# Patient Record
Sex: Female | Born: 1950 | Race: White | Hispanic: No | Marital: Married | Smoking: Former smoker
Health system: Southern US, Community
[De-identification: ages and names within clinical notes are randomized; demographics above are authoritative.]

## PROBLEM LIST (undated history)

## (undated) DIAGNOSIS — F329 Major depressive disorder, single episode, unspecified: Secondary | ICD-10-CM

## (undated) DIAGNOSIS — H269 Unspecified cataract: Secondary | ICD-10-CM

## (undated) DIAGNOSIS — R519 Headache, unspecified: Secondary | ICD-10-CM

## (undated) DIAGNOSIS — R011 Cardiac murmur, unspecified: Secondary | ICD-10-CM

## (undated) DIAGNOSIS — C801 Malignant (primary) neoplasm, unspecified: Secondary | ICD-10-CM

## (undated) DIAGNOSIS — F419 Anxiety disorder, unspecified: Secondary | ICD-10-CM

## (undated) DIAGNOSIS — C44621 Squamous cell carcinoma of skin of unspecified upper limb, including shoulder: Secondary | ICD-10-CM

## (undated) DIAGNOSIS — E785 Hyperlipidemia, unspecified: Secondary | ICD-10-CM

## (undated) DIAGNOSIS — Q2381 Bicuspid aortic valve: Secondary | ICD-10-CM

## (undated) DIAGNOSIS — J189 Pneumonia, unspecified organism: Secondary | ICD-10-CM

## (undated) DIAGNOSIS — J300 Vasomotor rhinitis: Secondary | ICD-10-CM

## (undated) DIAGNOSIS — C4491 Basal cell carcinoma of skin, unspecified: Secondary | ICD-10-CM

## (undated) HISTORY — DX: Malignant (primary) neoplasm, unspecified: C80.1

## (undated) HISTORY — DX: Squamous cell carcinoma of skin of unspecified upper limb, including shoulder: C44.621

## (undated) HISTORY — DX: Anxiety disorder, unspecified: F41.9

## (undated) HISTORY — DX: Bicuspid aortic valve: Q23.81

## (undated) HISTORY — DX: Hyperlipidemia, unspecified: E78.5

## (undated) HISTORY — DX: Vasomotor rhinitis: J30.0

## (undated) HISTORY — DX: Pneumonia, unspecified organism: J18.9

## (undated) HISTORY — DX: Headache, unspecified: R51.9

## (undated) HISTORY — DX: Unspecified cataract: H26.9

## (undated) HISTORY — DX: Basal cell carcinoma of skin, unspecified: C44.91

## (undated) HISTORY — DX: Major depressive disorder, single episode, unspecified: F32.9

## (undated) HISTORY — DX: Cardiac murmur, unspecified: R01.1

## (undated) HISTORY — PX: TONSILLECTOMY: SUR1361

---
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
## 1998-11-01 ENCOUNTER — Encounter: Payer: Self-pay | Admitting: Internal Medicine

## 1998-11-01 ENCOUNTER — Ambulatory Visit (HOSPITAL_COMMUNITY): Admission: RE | Admit: 1998-11-01 | Discharge: 1998-11-01 | Payer: Self-pay | Admitting: Nurse Practitioner

## 1998-11-21 ENCOUNTER — Other Ambulatory Visit: Admission: RE | Admit: 1998-11-21 | Discharge: 1998-11-21 | Payer: Self-pay | Admitting: Obstetrics & Gynecology

## 1999-12-04 ENCOUNTER — Other Ambulatory Visit: Admission: RE | Admit: 1999-12-04 | Discharge: 1999-12-04 | Payer: Self-pay | Admitting: Obstetrics and Gynecology

## 2000-09-22 ENCOUNTER — Encounter: Admission: RE | Admit: 2000-09-22 | Discharge: 2000-09-22 | Payer: Self-pay | Admitting: Internal Medicine

## 2000-09-22 ENCOUNTER — Encounter: Payer: Self-pay | Admitting: Internal Medicine

## 2000-12-29 ENCOUNTER — Other Ambulatory Visit: Admission: RE | Admit: 2000-12-29 | Discharge: 2000-12-29 | Payer: Self-pay | Admitting: Obstetrics and Gynecology

## 2001-11-01 ENCOUNTER — Encounter: Payer: Self-pay | Admitting: Obstetrics and Gynecology

## 2001-11-01 ENCOUNTER — Encounter: Admission: RE | Admit: 2001-11-01 | Discharge: 2001-11-01 | Payer: Self-pay | Admitting: Obstetrics and Gynecology

## 2002-05-29 ENCOUNTER — Other Ambulatory Visit: Admission: RE | Admit: 2002-05-29 | Discharge: 2002-05-29 | Payer: Self-pay | Admitting: Obstetrics and Gynecology

## 2002-11-08 ENCOUNTER — Encounter: Payer: Self-pay | Admitting: Obstetrics and Gynecology

## 2002-11-08 ENCOUNTER — Encounter: Admission: RE | Admit: 2002-11-08 | Discharge: 2002-11-08 | Payer: Self-pay | Admitting: Obstetrics and Gynecology

## 2003-07-04 ENCOUNTER — Other Ambulatory Visit: Admission: RE | Admit: 2003-07-04 | Discharge: 2003-07-04 | Payer: Self-pay | Admitting: Obstetrics and Gynecology

## 2003-07-26 ENCOUNTER — Encounter: Payer: Self-pay | Admitting: Internal Medicine

## 2003-07-26 ENCOUNTER — Ambulatory Visit (HOSPITAL_COMMUNITY): Admission: RE | Admit: 2003-07-26 | Discharge: 2003-07-26 | Payer: Self-pay | Admitting: Internal Medicine

## 2003-11-22 ENCOUNTER — Encounter: Admission: RE | Admit: 2003-11-22 | Discharge: 2003-11-22 | Payer: Self-pay | Admitting: Obstetrics and Gynecology

## 2004-12-11 ENCOUNTER — Ambulatory Visit (HOSPITAL_COMMUNITY): Admission: RE | Admit: 2004-12-11 | Discharge: 2004-12-11 | Payer: Self-pay | Admitting: Obstetrics and Gynecology

## 2005-01-01 ENCOUNTER — Other Ambulatory Visit: Admission: RE | Admit: 2005-01-01 | Discharge: 2005-01-01 | Payer: Self-pay | Admitting: Obstetrics and Gynecology

## 2005-04-16 ENCOUNTER — Ambulatory Visit: Payer: Self-pay | Admitting: Internal Medicine

## 2005-06-16 ENCOUNTER — Ambulatory Visit: Payer: Self-pay | Admitting: Licensed Clinical Social Worker

## 2005-06-26 ENCOUNTER — Ambulatory Visit: Payer: Self-pay | Admitting: Licensed Clinical Social Worker

## 2005-07-08 ENCOUNTER — Ambulatory Visit: Payer: Self-pay | Admitting: Licensed Clinical Social Worker

## 2005-07-31 ENCOUNTER — Ambulatory Visit: Payer: Self-pay | Admitting: Licensed Clinical Social Worker

## 2006-01-07 ENCOUNTER — Ambulatory Visit (HOSPITAL_COMMUNITY): Admission: RE | Admit: 2006-01-07 | Discharge: 2006-01-07 | Payer: Self-pay | Admitting: Obstetrics and Gynecology

## 2006-03-02 ENCOUNTER — Other Ambulatory Visit: Admission: RE | Admit: 2006-03-02 | Discharge: 2006-03-02 | Payer: Self-pay | Admitting: Obstetrics and Gynecology

## 2006-12-16 ENCOUNTER — Ambulatory Visit: Payer: Self-pay | Admitting: Internal Medicine

## 2006-12-16 LAB — CONVERTED CEMR LAB
ALT: 18 units/L (ref 0–40)
AST: 18 units/L (ref 0–37)
Alkaline Phosphatase: 93 units/L (ref 39–117)
Basophils Relative: 0.9 % (ref 0.0–1.0)
CO2: 30 meq/L (ref 19–32)
Calcium: 8.9 mg/dL (ref 8.4–10.5)
Chloride: 105 meq/L (ref 96–112)
Chol/HDL Ratio, serum: 3.4
Cholesterol: 262 mg/dL (ref 0–200)
Creatinine, Ser: 0.8 mg/dL (ref 0.4–1.2)
Eosinophil percent: 1.7 % (ref 0.0–5.0)
Glomerular Filtration Rate, Af Am: 96 mL/min/{1.73_m2}
Glucose, Bld: 99 mg/dL (ref 70–99)
HDL: 77.7 mg/dL (ref >39.0)
LDL DIRECT: 175.6 mg/dL
Leukocytes, UA: NEGATIVE
Lymphocytes Relative: 19.2 % (ref 12.0–46.0)
MCHC: 34.5 g/dL (ref 30.0–36.0)
MCV: 89.5 fL (ref 78.0–100.0)
Neutro Abs: 3.6 10*3/uL (ref 1.4–7.7)
Nitrite: NEGATIVE
Platelets: 271 10*3/uL (ref 150–400)
Potassium: 3.6 meq/L (ref 3.5–5.1)
Triglyceride fasting, serum: 79 mg/dL (ref 0–149)
Urine Glucose: NEGATIVE mg/dL
Urobilinogen, UA: 0.2 (ref 0.0–1.0)

## 2006-12-21 HISTORY — PX: COLONOSCOPY: SHX174

## 2006-12-23 ENCOUNTER — Ambulatory Visit: Payer: Self-pay | Admitting: Internal Medicine

## 2007-01-28 ENCOUNTER — Ambulatory Visit (HOSPITAL_COMMUNITY): Admission: RE | Admit: 2007-01-28 | Discharge: 2007-01-28 | Payer: Self-pay | Admitting: Obstetrics and Gynecology

## 2007-02-11 ENCOUNTER — Encounter: Admission: RE | Admit: 2007-02-11 | Discharge: 2007-02-11 | Payer: Self-pay | Admitting: Obstetrics and Gynecology

## 2007-03-24 ENCOUNTER — Ambulatory Visit: Payer: Self-pay | Admitting: Gastroenterology

## 2007-04-06 ENCOUNTER — Ambulatory Visit: Payer: Self-pay | Admitting: Gastroenterology

## 2007-04-20 ENCOUNTER — Ambulatory Visit: Payer: Self-pay | Admitting: Internal Medicine

## 2007-10-15 ENCOUNTER — Ambulatory Visit: Payer: Self-pay | Admitting: Internal Medicine

## 2008-02-15 ENCOUNTER — Encounter: Payer: Self-pay | Admitting: *Deleted

## 2008-02-15 DIAGNOSIS — Z9089 Acquired absence of other organs: Secondary | ICD-10-CM | POA: Insufficient documentation

## 2008-02-15 DIAGNOSIS — F419 Anxiety disorder, unspecified: Secondary | ICD-10-CM | POA: Insufficient documentation

## 2008-02-15 DIAGNOSIS — F329 Major depressive disorder, single episode, unspecified: Secondary | ICD-10-CM

## 2008-02-15 DIAGNOSIS — E785 Hyperlipidemia, unspecified: Secondary | ICD-10-CM | POA: Insufficient documentation

## 2008-02-15 DIAGNOSIS — J309 Allergic rhinitis, unspecified: Secondary | ICD-10-CM | POA: Insufficient documentation

## 2008-03-13 ENCOUNTER — Ambulatory Visit (HOSPITAL_COMMUNITY): Admission: RE | Admit: 2008-03-13 | Discharge: 2008-03-13 | Payer: Self-pay | Admitting: Obstetrics and Gynecology

## 2008-04-24 ENCOUNTER — Ambulatory Visit: Payer: Self-pay | Admitting: Internal Medicine

## 2008-04-24 ENCOUNTER — Telehealth: Payer: Self-pay | Admitting: Internal Medicine

## 2008-04-24 DIAGNOSIS — M7710 Lateral epicondylitis, unspecified elbow: Secondary | ICD-10-CM | POA: Insufficient documentation

## 2008-07-11 ENCOUNTER — Telehealth: Payer: Self-pay | Admitting: Internal Medicine

## 2008-07-11 ENCOUNTER — Encounter: Payer: Self-pay | Admitting: Internal Medicine

## 2008-07-16 ENCOUNTER — Ambulatory Visit: Payer: Self-pay | Admitting: Internal Medicine

## 2008-07-16 LAB — CONVERTED CEMR LAB
Albumin: 3.9 g/dL (ref 3.5–5.2)
Bilirubin, Direct: 0.1 mg/dL (ref 0.0–0.3)
Cholesterol: 245 mg/dL (ref 0–200)
Direct LDL: 176.3 mg/dL
Total Bilirubin: 0.7 mg/dL (ref 0.3–1.2)
Total CHOL/HDL Ratio: 4.1
Vitamin B-12: 196 pg/mL — ABNORMAL LOW (ref 211–911)

## 2008-07-18 ENCOUNTER — Ambulatory Visit: Payer: Self-pay | Admitting: Internal Medicine

## 2008-07-18 DIAGNOSIS — E538 Deficiency of other specified B group vitamins: Secondary | ICD-10-CM | POA: Insufficient documentation

## 2008-08-15 ENCOUNTER — Ambulatory Visit: Payer: Self-pay | Admitting: Internal Medicine

## 2008-08-15 LAB — CONVERTED CEMR LAB
AST: 15 units/L (ref 0–37)
Albumin: 4 g/dL (ref 3.5–5.2)
Alkaline Phosphatase: 75 units/L (ref 39–117)
Bilirubin, Direct: 0.1 mg/dL (ref 0.0–0.3)
HDL: 77.5 mg/dL (ref >39.0)
Total CHOL/HDL Ratio: 2.5

## 2008-08-23 ENCOUNTER — Telehealth (INDEPENDENT_AMBULATORY_CARE_PROVIDER_SITE_OTHER): Payer: Self-pay | Admitting: *Deleted

## 2008-09-07 ENCOUNTER — Telehealth (INDEPENDENT_AMBULATORY_CARE_PROVIDER_SITE_OTHER): Payer: Self-pay | Admitting: *Deleted

## 2008-09-12 ENCOUNTER — Ambulatory Visit: Payer: Self-pay | Admitting: Internal Medicine

## 2008-10-04 ENCOUNTER — Telehealth: Payer: Self-pay | Admitting: Internal Medicine

## 2008-10-12 ENCOUNTER — Ambulatory Visit: Payer: Self-pay | Admitting: Internal Medicine

## 2008-11-12 ENCOUNTER — Ambulatory Visit: Payer: Self-pay | Admitting: Internal Medicine

## 2008-12-17 ENCOUNTER — Ambulatory Visit: Payer: Self-pay | Admitting: Internal Medicine

## 2008-12-26 ENCOUNTER — Telehealth: Payer: Self-pay | Admitting: Internal Medicine

## 2008-12-27 ENCOUNTER — Ambulatory Visit: Payer: Self-pay | Admitting: Internal Medicine

## 2008-12-27 LAB — CONVERTED CEMR LAB: Vitamin B-12: 450 pg/mL (ref 211–911)

## 2008-12-31 ENCOUNTER — Telehealth: Payer: Self-pay | Admitting: Internal Medicine

## 2009-01-17 ENCOUNTER — Ambulatory Visit: Payer: Self-pay | Admitting: Internal Medicine

## 2009-02-18 ENCOUNTER — Ambulatory Visit: Payer: Self-pay | Admitting: Internal Medicine

## 2009-03-21 ENCOUNTER — Ambulatory Visit: Payer: Self-pay | Admitting: Internal Medicine

## 2009-04-22 ENCOUNTER — Ambulatory Visit: Payer: Self-pay | Admitting: Internal Medicine

## 2009-06-05 ENCOUNTER — Ambulatory Visit: Payer: Self-pay | Admitting: Internal Medicine

## 2009-07-08 ENCOUNTER — Ambulatory Visit: Payer: Self-pay | Admitting: Internal Medicine

## 2009-08-08 ENCOUNTER — Ambulatory Visit (HOSPITAL_COMMUNITY): Admission: RE | Admit: 2009-08-08 | Discharge: 2009-08-08 | Payer: Self-pay | Admitting: Obstetrics and Gynecology

## 2009-08-13 ENCOUNTER — Ambulatory Visit: Payer: Self-pay | Admitting: Internal Medicine

## 2009-09-16 ENCOUNTER — Ambulatory Visit: Payer: Self-pay | Admitting: Internal Medicine

## 2009-10-18 ENCOUNTER — Ambulatory Visit: Payer: Self-pay | Admitting: Internal Medicine

## 2009-11-21 ENCOUNTER — Ambulatory Visit: Payer: Self-pay | Admitting: Internal Medicine

## 2009-12-26 ENCOUNTER — Ambulatory Visit: Payer: Self-pay | Admitting: Internal Medicine

## 2010-01-09 ENCOUNTER — Ambulatory Visit: Payer: Self-pay | Admitting: Internal Medicine

## 2010-01-09 LAB — CONVERTED CEMR LAB
ALT: 22 units/L (ref 0–35)
Albumin: 4.2 g/dL (ref 3.5–5.2)
Alkaline Phosphatase: 91 units/L (ref 39–117)
Basophils Absolute: 0 10*3/uL (ref 0.0–0.1)
Bilirubin, Direct: 0.1 mg/dL (ref 0.0–0.3)
Calcium: 9.5 mg/dL (ref 8.4–10.5)
Chloride: 104 meq/L (ref 96–112)
Creatinine, Ser: 0.7 mg/dL (ref 0.4–1.2)
Direct LDL: 179.8 mg/dL
Eosinophils Relative: 1.6 % (ref 0.0–5.0)
HCT: 43.2 % (ref 36.0–46.0)
HDL: 72.9 mg/dL (ref >39.00)
Hemoglobin: 14.5 g/dL (ref 12.0–15.0)
Leukocytes, UA: NEGATIVE
MCV: 93.2 fL (ref 78.0–100.0)
Platelets: 203 10*3/uL (ref 150.0–400.0)
RBC: 4.64 M/uL (ref 3.87–5.11)
RDW: 12.7 % (ref 11.5–14.6)
Sodium: 142 meq/L (ref 135–145)
Specific Gravity, Urine: 1.02 (ref 1.000–1.030)
TSH: 4.62 microintl units/mL (ref 0.35–5.50)
Total Bilirubin: 0.8 mg/dL (ref 0.3–1.2)
Total CHOL/HDL Ratio: 3
Total Protein: 6.8 g/dL (ref 6.0–8.3)
Urobilinogen, UA: 0.2 (ref 0.0–1.0)

## 2010-01-16 ENCOUNTER — Ambulatory Visit: Payer: Self-pay | Admitting: Internal Medicine

## 2011-01-11 ENCOUNTER — Encounter: Payer: Self-pay | Admitting: Obstetrics and Gynecology

## 2011-01-20 NOTE — Assessment & Plan Note (Signed)
Summary: B12 SHOT/MEN/CD   Nurse Visit   Allergies: No Known Drug Allergies  Medication Administration  Injection # 1:    Medication: Vit B12 1000 mcg    Diagnosis: VITAMIN B12 DEFICIENCY (ICD-266.2)    Route: IM    Site: L deltoid    Exp Date: 08/22/2011    Lot #: [id number]    Mfr: [employer] Regent    Patient tolerated injection without complications    Given by: Lucious Groves (December 26, 2009 8:40 AM)  Orders Added: 1)  Vit B12 1000 mcg [J3420] 2)  Admin of Therapeutic Inj  intramuscular or subcutaneous [96045]

## 2011-01-20 NOTE — Assessment & Plan Note (Signed)
Summary: CPX-LB   Vital Signs:  Patient profile:   60 year old female Height:      65 inches Weight:      145 pounds BMI:     24.22 O2 Sat:      98 % on Room air Temp:     98.2 degrees F oral Pulse rate:   67 / minute BP sitting:   136 / 82  (left arm) Cuff size:   regular  Vitals Entered By: Bill Salinas CMA (January 16, 2010 1:30 PM)  O2 Flow:  Room air CC: cpx/ pt is due for tetanus and she is due for pap with Dr Clydie Braun Richardson/ ab  Vision Screening:      Vision Comments: Due in about 1 month with Dr Blima Ledger  Vision Entered By: Bill Salinas CMA (January 16, 2010 1:34 PM)   Primary Care Provider:  Norins  CC:  cpx/ pt is due for tetanus and she is due for pap with Dr Clydie Braun Richardson/ ab.  History of Present Illness: She presents for routine exam. She is feeling good with only minor aches and pains. She did miss her Gyn appointment and will reschedule. Had mammography which was normal (no report to me). During the year she had a basal carcinoma excised Venancio Poisson).  Current Medications (verified): 1)  Vitamin C 500 Mg  Tabs (Ascorbic Acid) .... Take 1 Tablet By Mouth Two Times A Day 2)  Calcium 600/vitamin D 600-400 Mg-Unit  Tabs (Calcium Carbonate-Vitamin D) .... Take 1 Tablet By Mouth A Day 3)  Fish Oil 1200 Mg  Caps (Omega-3 Fatty Acids) .... Take 1 Tablet By Mouth Two Times A Day 4)  Alprazolam 0.25 Mg Tabs (Alprazolam) .Marland Kitchen.. 1 By Mouth Q6 As Needed 5)  Daily Multiple Vitamins  Tabs (Multiple Vitamin) .Marland Kitchen.. 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    Physical Exam  General:  Well-developed,well-nourished,in no acute distress; alert,appropriate and cooperative throughout examination Head:  Normocephalic and atraumatic without obvious abnormalities. No apparent alopecia or balding. Eyes:  No corneal or conjunctival inflammation noted. EOMI. Perrla. Funduscopic exam benign, without hemorrhages, exudates or papilledema. Vision grossly normal. Ears:  External ear exam shows no significant lesions or deformities.  Otoscopic examination reveals clear canals,  tympanic membranes are intact bilaterally without bulging, retraction, inflammation or discharge. Hearing is grossly normal bilaterally. Nose:  External nasal examination shows no deformity or inflammation. Nasal mucosa are pink and moist without lesions or exudates. Mouth:  Oral mucosa and oropharynx without lesions or exudates.  Teeth in good repair. Neck:   No deformities, masses, or tenderness noted. Chest Wall:  No deformities, masses, or tenderness noted. Lungs:  normal respiratory effort and normal breath sounds.   Heart:  normal rate and regular rhythm.   Abdomen:  soft, non-tender, normal bowel sounds, and no hepatomegaly.   Genitalia:  deferred Msk:  No deformity or scoliosis noted of thoracic or lumbar spine.   Pulses:  2+ radials Neurologic:  alert & oriented X3 and cranial nerves II-XII intact.   Skin:  turgor normal, no suspicious lesions, no ulcerations, and no edema.   Psych:  Oriented X3 and memory intact for recent and remote.     Impression & Recommendations:  Problem # 1:  HYPERLIPIDEMIA (ICD-272.4)  Chart reviewed - in '09 trial of lovastatin with resuliting LDL to 72!She developed myalgias and stopped meds. Did Framingham cardiac risk calculation: 10 year risk went from 2% with current HDL to 1 % with total cholesteol at goal.   Plan - life-style mangement only.  The following medications were removed from the medication list:    Lovastatin 20 Mg Tabs (Lovastatin) .Marland Kitchen... 1 by mouth q pm  Problem # 2:  Preventive Health Care (ICD-V70.0) Normal history and normal exam. Labs were all normal except of for elevated cholsterol. She is current with gyn. Current with colorectal cancer screeing with normal colonoscopg in '08. She is cude for mammography and will schedule her own appointment.  She will work on weight reduction through smart choices in her diet and portion size control with a goal of loosing 1 lb/month.   In summary - a very nice woman who is medically stable. We did discuss in detail about cardiac risk and lipis including a caridac risk  calculation as noted above.  she wil continue a healthy life-style and return for routine follow-up in 18 -24 months.   Complete Medication List: 1)  Vitamin C 500 Mg Tabs (Ascorbic acid) .... Take 1 tablet by mouth two times a day 2)  Calcium 600/vitamin D 600-400 Mg-unit Tabs  (Calcium carbonate-vitamin d) .... Take 1 tablet by mouth a day 3)  Fish Oil 1200 Mg Caps (Omega-3 fatty acids) .... Take 1 tablet by mouth two times a day 4)  Alprazolam 0.25 Mg Tabs (Alprazolam) .Marland Kitchen.. 1 by mouth q6 as needed 5)  Daily Multiple Vitamins Tabs (Multiple vitamin) .Marland Kitchen.. 1 tab daily  Patient: [facility] Note: All result statuses are Final unless otherwise noted.  Tests: (1) BMP (METABOL)   Sodium                    142 mEq/L                   135-145   Potassium                 4.9 mEq/L                   3.5-5.1   Chloride                  104 mEq/L                   96-112  Carbon Dioxide            31 mEq/L                    19-32   Glucose                   93 mg/dL                    04-54   BUN                       12 mg/dL                    0-98   Creatinine                0.7 mg/dL                   1.1-9.1   Calcium                   9.5 mg/dL                   4.7-82.9   GFR                       91.67 mL/min                >60  Tests: (2) Lipid Panel (LIPID)   Cholesterol          [H]  249 mg/dL                   5-621     ATP III Classification            Desirable:  < 200 mg/dL                    Borderline High:  200 - 239 mg/dL               High:  > = 240 mg/dL   Triglycerides             63.0 mg/dL                  3.0-865.7     Normal:  <150 mg/dL     Borderline High:  846 - 199 mg/dL   HDL                       96.29 mg/dL                 >52.84   VLDL Cholesterol          12.6 mg/dL                  1.3-24.4  CHO/HDL Ratio:  CHD Risk                             3                    Men          Women     1/2 Average Risk     3.4          3.3     Average Risk          5.0  4.4     2X Average Risk          9.6          7.1     3X Average Risk          15.0          11.0                           Tests: (3) CBC Platelet w/Diff (CBCD)   White Cell Count     [L]  4.3 K/uL                    4.5-10.5   Red Cell Count            4.64  Mil/uL                 3.87-5.11   Hemoglobin                14.5 g/dL                   19.1-47.8   Hematocrit                43.2 %                      36.0-46.0   MCV                       93.2 fl                     78.0-100.0   MCHC                      33.5 g/dL                   29.5-62.1   RDW                       12.7 %                      11.5-14.6   Platelet Count            203.0 K/uL                  150.0-400.0   Neutrophil %              67.0 %                      43.0-77.0   Lymphocyte %              23.3 %                      12.0-46.0   Monocyte %                7.9 %                       3.0-12.0   Eosinophils%              1.6 %                       0.0-5.0   Basophils %               0.2 %  0.0-3.0   Neutrophill Absolute      2.9 K/uL                    1.4-7.7   Lymphocyte Absolute       1.0 K/uL                    0.7-4.0   Monocyte Absolute         0.3 K/uL                    0.1-1.0  Eosinophils, Absolute                             0.1 K/uL                    0.0-0.7   Basophils Absolute        0.0 K/uL                    0.0-0.1  Tests: (4) Hepatic/Liver Function Panel (HEPATIC)   Total Bilirubin           0.8 mg/dL                   1.6-1.0   Direct Bilirubin          0.1 mg/dL                   9.6-0.4   Alkaline Phosphatase      91 U/L                      39-117   AST                       20 U/L                      0-37   ALT                       22 U/L                      0-35   Total Protein             6.8 g/dL                    5.4-0.9   Albumin                   4.2 g/dL                    8.1-1.9  Tests: (5) TSH (TSH)   FastTSH                   4.62 uIU/mL                 0.35-5.50  Tests: (6) UDip Only (UDIP)   Color                     YELLOW       RANGE:  Yellow;Lt. Yellow   Clarity                   CLEAR  Clear   Specific Gravity          1.020                       1.000 - 1.030    Urine Ph                  5.5                         5.0-8.0   Protein                   NEGATIVE                    Negative   Urine Glucose             NEGATIVE                    Negative   Ketones                   NEGATIVE                    Negative   Urine Bilirubin           NEGATIVE                    Negative   Blood                     TRACE-INTACT                Negative   Urobilinogen              0.2                         0.0 - 1.0   Leukocyte Esterace        NEGATIVE                    Negative   Nitrite                   NEGATIVE                    Negative  Tests: (7) Cholesterol LDL - Direct (DIRLDL)  Cholesterol LDL - Direct                             179.8 mg/dL     Optimal:  <161 mg/dL     Near or Above Optimal:  100-129 mg/dL     Borderline High:  096-045 mg/dL     High:  409-811 mg/dL     Very High:  >914 mg/dL

## 2011-05-08 NOTE — Assessment & Plan Note (Signed)
[facility]                           PRIMARY CARE OFFICE NOTE   Dana Bryan, Dana Bryan                     MRN:          [id number]  DATE:12/23/2006                            DOB:          1951/10/28    Dana Bryan is a very pleasant 60 year old woman who presents today for  follow-up evaluation and exam.  She was last seen in the office April 16, 2005.  Problems at that time included leg pain, which was treated  with diclofenac and eventually resolved.  The patient was also being  seen for mild situational and motivational depression.  She did have  several counseling sessions.  She was on Wellbutrin XL 300 mg q.a.m.  She has done better and since that time has been able to stop  medication.   The patient has no active complaints at today's visit and reports she is  feeling well and doing well.   PAST MEDICAL HISTORY:  Surgical:  Tonsillectomy, remote.  She is a gravida 2, para 2.   Medical:  1. Usual childhood disease.  2. Vasomotor rhinitis.  3. Multiple moles have been excised.   No other active medical problems.   CURRENT MEDICATIONS:  Primrose oil b.i.d.   REVIEW OF SYSTEMS:  The patient has had no fevers, sweats, chills or  other constitutional symptoms.  No ophthalmologic, ENT, cardiovascular,  respiratory, GI or GU complaints.  The patient continues to see Dr.  Huel Cote for GYN, last examination being in the spring of 2007.  She reports she had a mammogram in spring of 2007 as well.   PHYSICAL EXAMINATION:  VITAL SIGNS:  Temperature was 97.8, blood  pressure 137/76, pulse 77, weight 148.  GENERAL APPEARANCE:  A well-nourished, well-developed woman in no acute  distress.  HEENT:  Normocephalic, atraumatic.  EACs and TMs were unremarkable.  Oropharynx with native dentition in good repair.  No buccal or palatal  lesions were noted.  Posterior pharynx was clear.  Conjunctivae and  sclerae were clear.  PERRLA, EOMI.   Funduscopic exam was unremarkable.  NECK:  Supple without thyromegaly.  NODES:  No adenopathy was noted in the cervical or supraclavicular  regions.  CHEST:  No CVA tenderness.  LUNGS:  Clear to auscultation and percussion.  BREASTS:  Exam deferred to gynecology.  CARDIOVASCULAR:  2+ radial pulse, no JVD, no carotid bruit.  She had a  quiet precordium with a regular rate and rhythm without murmurs, rubs or  gallops.  ABDOMEN:  Soft, no guarding, no rebound.  No organosplenomegaly was  appreciated.  PELVIC, RECTAL:  Exams referred to gynecology.  EXTREMITIES:  Without clubbing, cyanosis, edema or deformity.  NEUROLOGIC:  Grossly nonfocal.  SKIN:  Skin appeared clear although a full body exam was not performed.   DATA BASE:  Hemoglobin 14.1 g, white count was 4900.  Chemistries were  normal with a glucose of 99, serum creatinine was 0.8, GFR was 80.  Liver functions were normal.  Thyroid function normal with TSH 2.31.  Urinalysis was negative.  Cholesterol was 262, triglycerides 79, HDL  77.7, LDL was 175.6.  ASSESSMENT AND PLAN:  1. Hyperlipidemia.  The patient does have an elevated LDL cholesterol.      In 2005 her LDL was 157.3.  Using the Framingham cardiac risk      calculation, the patient has a 10-year risk of cardiovascular event      of 4%, which is better than average.  Her heart data risk is 2%.      Discussed this with the patient.  At this point I would recommend      lifestyle management with diet and exercise to get her cholesterol      level down to a goal of less than 130 with a treatment goal of      greater than 160 on the LDL.  The patient will return in 6 months      for a repeat lipid panel  2. Health maintenance.  The patient is currently up to date with her      gynecologist.  Her examination today is otherwise unrevealing.  She      is a former smoker now being almost 3 years abstinent.  She will be      sent for PA and lateral chest x-ray.  3. The patient  has been encouraged to obtain colorectal cancer      screening and a referral will be made to the GI department for the      same.   In summary, this is a very pleasant woman who seems medically stable at  this time.  She will return for follow-up on her lipids and will be  scheduled for colonoscopy.     Rosalyn Gess Norins, MD  Electronically Signed    MEN/MedQ  DD: 12/24/2006  DT: 12/24/2006  Job #: [id number]   cc:   Milagros Reap

## 2011-07-30 ENCOUNTER — Other Ambulatory Visit (INDEPENDENT_AMBULATORY_CARE_PROVIDER_SITE_OTHER): Payer: BC Managed Care – PPO

## 2011-07-30 ENCOUNTER — Other Ambulatory Visit: Payer: Self-pay | Admitting: Internal Medicine

## 2011-07-30 DIAGNOSIS — Z0000 Encounter for general adult medical examination without abnormal findings: Secondary | ICD-10-CM

## 2011-07-30 LAB — URINALYSIS
Bilirubin Urine: NEGATIVE
Hgb urine dipstick: NEGATIVE
Leukocytes, UA: NEGATIVE
Nitrite: NEGATIVE
Urobilinogen, UA: 0.2 (ref 0.0–1.0)

## 2011-07-30 LAB — CBC WITH DIFFERENTIAL/PLATELET
Basophils Relative: 0.3 % (ref 0.0–3.0)
Eosinophils Absolute: 0.1 10*3/uL (ref 0.0–0.7)
Eosinophils Relative: 1.2 % (ref 0.0–5.0)
Lymphs Abs: 1.1 10*3/uL (ref 0.7–4.0)
Monocytes Absolute: 0.3 10*3/uL (ref 0.1–1.0)
Monocytes Relative: 6.9 % (ref 3.0–12.0)
Neutro Abs: 3.4 10*3/uL (ref 1.4–7.7)
Platelets: 219 10*3/uL (ref 150.0–400.0)
RBC: 4.76 Mil/uL (ref 3.87–5.11)
WBC: 4.9 10*3/uL (ref 4.5–10.5)

## 2011-07-30 LAB — HEPATIC FUNCTION PANEL
Albumin: 4.4 g/dL (ref 3.5–5.2)
Bilirubin, Direct: 0.1 mg/dL (ref 0.0–0.3)
Total Bilirubin: 0.9 mg/dL (ref 0.3–1.2)
Total Protein: 7.4 g/dL (ref 6.0–8.3)

## 2011-07-30 LAB — BASIC METABOLIC PANEL
BUN: 14 mg/dL (ref 6–23)
CO2: 30 mEq/L (ref 19–32)
Calcium: 9.3 mg/dL (ref 8.4–10.5)
Chloride: 105 mEq/L (ref 96–112)
GFR: 81.68 mL/min (ref >60.00)
Glucose, Bld: 98 mg/dL (ref 70–99)
Potassium: 4.4 mEq/L (ref 3.5–5.1)

## 2011-07-30 LAB — LDL CHOLESTEROL, DIRECT: Direct LDL: 168.5 mg/dL

## 2011-07-30 LAB — LIPID PANEL: Cholesterol: 254 mg/dL — ABNORMAL HIGH (ref 0–200)

## 2011-08-05 ENCOUNTER — Encounter: Payer: Self-pay | Admitting: Internal Medicine

## 2011-08-06 ENCOUNTER — Encounter: Payer: Self-pay | Admitting: Internal Medicine

## 2011-08-06 ENCOUNTER — Ambulatory Visit (INDEPENDENT_AMBULATORY_CARE_PROVIDER_SITE_OTHER): Payer: BC Managed Care – PPO | Admitting: Internal Medicine

## 2011-08-06 VITALS — BP 138/80 | HR 61 | Temp 97.9°F | Ht 64.5 in | Wt 143.0 lb

## 2011-08-06 DIAGNOSIS — Z136 Encounter for screening for cardiovascular disorders: Secondary | ICD-10-CM

## 2011-08-06 DIAGNOSIS — Z0000 Encounter for general adult medical examination without abnormal findings: Secondary | ICD-10-CM

## 2011-08-06 DIAGNOSIS — F4321 Adjustment disorder with depressed mood: Secondary | ICD-10-CM

## 2011-08-06 DIAGNOSIS — E785 Hyperlipidemia, unspecified: Secondary | ICD-10-CM

## 2011-08-06 NOTE — Assessment & Plan Note (Signed)
She admits to mild "downs" which she attributes to working at home and being professionally isolated. She does not feel this is serious nor does she feel a need for any counseling or medical therapy.

## 2011-08-06 NOTE — Progress Notes (Signed)
Subjective:    Patient ID: Dana Bryan, female    DOB: 03-09-1951, 60 y.o.   MRN: [id number]  HPI Dana Bryan presents for general physical exam. She has not had any major illness, injury or surgery. She does report that she has some trouble with mood: she does work out of her home and is isolated. She recognizes this as an issue and is seeking answers. She is current with gyn care.  Past Medical History  Diagnosis Date  . Hyperlipidemia   . Reactive depression (situational)   . Vasomotor rhinitis    Past Surgical History  Procedure Date  . Tonsillectomy    Family History  Problem Relation Age of Onset  . Hypertension Mother   . Alcohol abuse Father    History   Social History  . Marital Status: Married    Spouse Name: N/A    Number of Children: N/A  . Years of Education: N/A   Occupational History  . Not on file.   Social History Main Topics  . Smoking status: Former Smoker    Types: Cigarettes    Quit date: 01/22/2004  . Smokeless tobacco: Not on file  . Alcohol Use: Not on file  . Drug Use: Not on file  . Sexually Active: Not on file   Other Topics Concern  . Not on file   Social History Narrative   HSG, college Grad. Married '71. 2 Daughters- '77, '81. 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  HEENT:  Negative for hearing loss, ear pain, congestion, neck stiffness and postnasal drip. Negative for sore throat or swallowing problems. Negative for dental complaints.   Eyes: Negative for vision loss or change in visual acuity.  Respiratory: Negative for chest tightness and wheezing.   Cardiovascular: Negative for chest pain and palpitation. No decreased exercise tolerance Gastrointestinal: No change in bowel habit. No bloating or gas. No reflux or  indigestion Genitourinary: Negative for urgency, frequency, flank pain and difficulty urinating.  Musculoskeletal: Negative for myalgias, back pain, arthralgias and gait problem.  Neurological: Negative for dizziness, tremors, weakness and headaches.  Hematological: Negative for adenopathy.  Psychiatric/Behavioral: Negative for behavioral problems and dysphoric mood.       Objective:   Physical Exam Vitals reviewed. Gen'l: well nourished, well developed white woman in no distress HEENT - [place]/AT, EACs/TMs normal, oropharynx with native dentition in good condition, no buccal or palatal lesions, posterior pharynx clear, mucous membranes moist. C&S clear, PERRLA, fundi - normal Neck - supple, no thyromegaly Nodes- negative submental, cervical, supraclavicular regions Chest - no deformity, no CVAT Lungs - clear without rales, wheezes. No increased work of breathing Breast - deferred Cardiovascular - regular rate and rhythm, quiet precordium, no murmurs, rubs or gallops, 2+ radial, DP and PT pulses Abdomen - BS+ x 4, no HSM, no guarding or rebound or tenderness Pelvic - deferred to gyn Rectal - deferred to gyn/GI Extremities - no clubbing, cyanosis, edema or deformity.  Neuro - A&O x 3, CN II-XII normal, motor strength normal and equal, DTRs 2+ and symmetrical biceps, radial, and patellar tendons. Cerebellar - no tremor, no rigidity, fluid movement and normal gait. Derm - Head, neck, back, abdomen and extremities without suspicious lesions  Lab Results  Component Value Date  WBC 4.9 07/30/2011   HGB 14.9 07/30/2011   HCT 44.2 07/30/2011   PLT 219.0 07/30/2011   CHOL 254* 07/30/2011   TRIG 92.0 07/30/2011   HDL 75.60 07/30/2011   LDLDIRECT 168.5 07/30/2011   ALT 17 07/30/2011   AST 14 07/30/2011   NA 142 07/30/2011   K 4.4 07/30/2011   CL 105 07/30/2011   CREATININE 0.8 07/30/2011   BUN 14 07/30/2011   CO2 30 07/30/2011   TSH 5.53* 07/30/2011           Assessment & Plan:

## 2011-08-06 NOTE — Assessment & Plan Note (Signed)
LDL remains over 160 after a year+ of life-style management. The HDL is very high and protective at 76. NCEP Cardiac Risk calculation using current total choelsterol and HDL reveals a 2% chance of a cardiac event over the next 10 years = low risk category.  Discussed cholesterol management and risk at length. Her decision is to continue with life-style management and not to engage in medical management.

## 2011-08-12 ENCOUNTER — Other Ambulatory Visit (HOSPITAL_COMMUNITY): Payer: Self-pay | Admitting: Obstetrics and Gynecology

## 2011-08-12 DIAGNOSIS — Z1231 Encounter for screening mammogram for malignant neoplasm of breast: Secondary | ICD-10-CM

## 2011-08-27 ENCOUNTER — Ambulatory Visit (HOSPITAL_COMMUNITY)
Admission: RE | Admit: 2011-08-27 | Discharge: 2011-08-27 | Disposition: A | Discharge status: Home / Self Care | Payer: BC Managed Care – PPO | Source: Ambulatory Visit | Attending: Obstetrics and Gynecology | Admitting: Obstetrics and Gynecology

## 2011-08-27 DIAGNOSIS — Z1231 Encounter for screening mammogram for malignant neoplasm of breast: Secondary | ICD-10-CM

## 2012-02-03 ENCOUNTER — Telehealth: Payer: Self-pay | Admitting: *Deleted

## 2012-02-03 NOTE — Telephone Encounter (Signed)
Pt is requesting referral because of foot pain and aches

## 2012-02-03 NOTE — Telephone Encounter (Signed)
Needs OV.  

## 2012-02-04 NOTE — Telephone Encounter (Signed)
Forwarded to scheduler/NS to set-up OV w/patient.

## 2012-02-23 ENCOUNTER — Encounter: Payer: Self-pay | Admitting: Internal Medicine

## 2012-02-23 ENCOUNTER — Ambulatory Visit (INDEPENDENT_AMBULATORY_CARE_PROVIDER_SITE_OTHER): Payer: BC Managed Care – PPO | Admitting: Internal Medicine

## 2012-02-23 ENCOUNTER — Ambulatory Visit (INDEPENDENT_AMBULATORY_CARE_PROVIDER_SITE_OTHER)
Admission: RE | Admit: 2012-02-23 | Discharge: 2012-02-23 | Disposition: A | Discharge status: Home / Self Care | Payer: BC Managed Care – PPO | Source: Ambulatory Visit | Attending: Internal Medicine | Admitting: Internal Medicine

## 2012-02-23 VITALS — BP 118/68 | HR 62 | Resp 14 | Wt 137.0 lb

## 2012-02-23 DIAGNOSIS — G8929 Other chronic pain: Secondary | ICD-10-CM

## 2012-02-23 DIAGNOSIS — M79671 Pain in right foot: Secondary | ICD-10-CM

## 2012-02-23 DIAGNOSIS — M79672 Pain in left foot: Secondary | ICD-10-CM

## 2012-02-23 DIAGNOSIS — M79609 Pain in unspecified limb: Secondary | ICD-10-CM

## 2012-02-23 NOTE — Progress Notes (Signed)
  Subjective:    Patient ID: Dana Bryan, female    DOB: [birth year], 61 y.o.   MRN: [id number]  HPI Dana Bryan has a long history of foot pain, since age 78. She has had some increase in foot pain and she is limited in her activities: walking, standing, dancing. She does get relief with NSAIDs. She has not had consultation; she does not currently use orthotics.  She feels great. Her BP has been well controlled.  PMH, FamHx and SocHx reviewed for any changes and relevance.    Review of Systems System review is negative for any constitutional, cardiac, pulmonary, GI or neuro symptoms or complaints other than as described in the HPI.     Objective:   Physical Exam Filed Vitals:   02/23/12 0928  BP: 118/68  Pulse: 62  Resp: 14   gen'l- WNWD white woman in no distress Cor- RRR Pulm - normal respirations Ext - 2+ DP pulse, mild elevation at MTP joints but no hammer toe deformity. No tenderness of plantar aspect MTP joints. Sensation is normal. No callus or bunion.       Assessment & Plan:

## 2012-02-23 NOTE — Assessment & Plan Note (Signed)
Chronic foot pain. Normal exam. Question of structural problem vs DJD  Plan - bilateral foot films           Refer to Dr. Darrick Penna.

## 2012-02-24 ENCOUNTER — Encounter: Payer: Self-pay | Admitting: Internal Medicine

## 2012-03-02 ENCOUNTER — Ambulatory Visit (INDEPENDENT_AMBULATORY_CARE_PROVIDER_SITE_OTHER): Payer: BC Managed Care – PPO | Admitting: Sports Medicine

## 2012-03-02 VITALS — BP 144/84 | Ht 64.0 in | Wt 137.0 lb

## 2012-03-02 DIAGNOSIS — R269 Unspecified abnormalities of gait and mobility: Secondary | ICD-10-CM

## 2012-03-02 DIAGNOSIS — Q667 Congenital pes cavus, unspecified foot: Secondary | ICD-10-CM | POA: Insufficient documentation

## 2012-03-02 NOTE — Patient Instructions (Signed)
Wear shoe inserts everyday in walking shoes. You may continue taking Ibuprofen as needed at night if foot pain is severe. Return in 1 month for follow up and bring more shoes with you. If bilateral foot pain persists, we may consider orthotics at that time. See you in one month.

## 2012-03-02 NOTE — Assessment & Plan Note (Addendum)
Supinated gait will hopefully improve with shoe inserts.  For 1 month We will use sports insoles with a moderate arch support and a lateral heel wedge  Walking gait after placement of these insoles showed much less supination at the rear foot  If this strategy works will recheck her in one month and consider putting her in a custom orthotic

## 2012-03-02 NOTE — Progress Notes (Signed)
  Subjective:    Patient ID: Dana Bryan, female    DOB: 1951/06/13, 61 y.o.   MRN: [id number]  HPI Patient referred courtesy of Dr Debby Bud  Patient presents with bilateral foot pain.  Has been going on for since she was 61 years old.  Pain is worse with exertion, better with rest.  Describes pain as dull, aching.  Located at dorsum of bilateral feet.  At times, pain will radiate from foot to knees and thighs.  No numbness or tingling sensation.  Patient was told by a friend that she has high arches.  Her grandmother had high arches as well.  Patient has not tried any heel inserts or insoles yet.  She takes ibuprofen occasionally at bedtime when pain is severe.  Review of Systems  Per HPI    Objective:   Physical Exam NAD  Normal inspection with no visible swelling/erythema.   Metatarsal Bossing present. No tenderness at medial insertion of plantar fascia into calcaneus. Great toe motion: normal Arch shape: high, right arch 3.1 cm, left arch 2.9 cm Some flattening of the lateral foot over the metatarsals 3 and 4 bilaterally Supinated gait abnormality with lateralization of foot strike      Assessment & Plan:

## 2012-03-02 NOTE — Assessment & Plan Note (Signed)
Shoe inserts given with padding on lateral part of insert. Return to clinic in 1 month.  Patient to bring more shoes. May consider custom orthotics if not better.

## 2012-04-04 ENCOUNTER — Encounter: Payer: Self-pay | Admitting: Sports Medicine

## 2012-04-04 ENCOUNTER — Ambulatory Visit (INDEPENDENT_AMBULATORY_CARE_PROVIDER_SITE_OTHER): Payer: BC Managed Care – PPO | Admitting: Sports Medicine

## 2012-04-04 VITALS — BP 134/91 | HR 62

## 2012-04-04 DIAGNOSIS — G8929 Other chronic pain: Secondary | ICD-10-CM

## 2012-04-04 DIAGNOSIS — M79609 Pain in unspecified limb: Secondary | ICD-10-CM

## 2012-04-04 NOTE — Progress Notes (Signed)
  Subjective:    Patient ID: Dana Bryan, female    DOB: 1951/09/01, 61 y.o.   MRN: [id number]  HPI 61 y/o female is here to follow up for her chronic bilateral foot pain, since age 9.  Her pain was no improved with sports insoles and lateral heel wedges.  She got some new heel pain with wearing these.  She doesn't have the heel pain today.  She noticed it most after prolonged walking.  She is here with a collection of her shoes.  Regarding her shoes she has a problem with fit.  Size 8 is too small and size 8.5 flop off nad her feet slide to the front of the shoe cramming her toes in.   Review of Systems     Objective:   Physical Exam No tenderness to palpation of either foot Bilateral pes cavus Midfoot and forefoot show some separation and arch collapse TMT bossing mild Supinated gait         Assessment & Plan:

## 2012-04-04 NOTE — Assessment & Plan Note (Signed)
The underlying issue is her pes cavus foot.  We have given her several types of scaphoid pads to try in her shoes.  We have taken the heel wedges off of the sports insoles but she will continue to use the sports insoles.  Once we determine which scaphoid pad works best for her, we will either build it into a custom orthotic or assist her in ordering several additional pair.

## 2012-07-06 ENCOUNTER — Encounter: Payer: Self-pay | Admitting: Internal Medicine

## 2012-07-06 ENCOUNTER — Telehealth: Payer: Self-pay | Admitting: Internal Medicine

## 2012-07-06 ENCOUNTER — Ambulatory Visit (INDEPENDENT_AMBULATORY_CARE_PROVIDER_SITE_OTHER): Payer: BC Managed Care – PPO | Admitting: Internal Medicine

## 2012-07-06 VITALS — BP 120/78 | HR 62 | Temp 97.9°F | Resp 16 | Wt 128.0 lb

## 2012-07-06 DIAGNOSIS — F329 Major depressive disorder, single episode, unspecified: Secondary | ICD-10-CM

## 2012-07-06 DIAGNOSIS — F32A Depression, unspecified: Secondary | ICD-10-CM

## 2012-07-06 DIAGNOSIS — F3289 Other specified depressive episodes: Secondary | ICD-10-CM

## 2012-07-06 MED ORDER — BUPROPION HCL ER (XL) 150 MG PO TB24: 150.0000 mg | ORAL_TABLET | Freq: Every day | ORAL | Status: DC

## 2012-07-06 NOTE — Telephone Encounter (Signed)
Caller: Malijah/Patient; PCP: Illene Regulus; CB#: [phone]. Call regarding Depression; Caller asking for an appt to see PCP re some depression that has worsened over past 2 weeks. Appt scheduled as requested for today, Wed 7/17 at 11am with Dr Debby Bud.

## 2012-07-09 ENCOUNTER — Encounter: Payer: Self-pay | Admitting: Internal Medicine

## 2012-07-09 NOTE — Assessment & Plan Note (Signed)
Reports persistent depression with low motivation. She denies suicidality. She has tried wellbutrin XL in the past for smoking cessation and tolerated it.  Plan  Medical therapy - Wellbutrin XL 150 mg qAM, return OV 3-4 weeks - will consider increasing dose to 300 mg  Asked her to consider counseling - provided tele # for [facility]

## 2012-07-09 NOTE — Progress Notes (Signed)
  Subjective:    Patient ID: Dana Bryan, female    DOB: 1951-04-27, 61 y.o.   MRN: [id number]  HPI Dana Bryan presents with the complaint of depression. She reports that for several months she has felt unhappy. She admits to irritability, anhedonia, worry, feeling of hopelessness, low motivational state. She reports no sexual activity in her marriage for several years - along with decreased libido. For the past several months she has tried coping on her own. She initially thought it was working at home with isolation that was a cause but now that she is back in the work place there is no improvement. She denies any suicidal ideation.  Past Medical History  Diagnosis Date  . Hyperlipidemia   . Reactive depression (situational)   . Vasomotor rhinitis    Past Surgical History  Procedure Date  . Tonsillectomy    Family History  Problem Relation Age of Onset  . Hypertension Mother   . Alcohol abuse Father    History   Social History  . Marital Status: Married    Spouse Name: N/A    Number of Children: N/A  . Years of Education: N/A   Occupational History  . Not on file.   Social History Main Topics  . Smoking status: Former Smoker    Types: Cigarettes    Quit date: 01/22/2004  . Smokeless tobacco: Not on file  . Alcohol Use: Not on file  . Drug Use: Not on file  . Sexually Active: Not on file   Other Topics Concern  . Not on file   Social History Narrative   HSG, college Grad. Married '71. 2 Daughters- '77, '81. 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  '11 has returned to work- out of the home ( [employer])    Current Outpatient Prescriptions on File Prior to Visit  Medication Sig Dispense Refill  . ALPRAZolam (XANAX) 0.25 MG tablet Take 0.25 mg by mouth every 6 (six) hours as needed.        . Calcium Carbonate-Vitamin D (CALCIUM 600+D) 600-200 MG-UNIT TABS Take 1 tablet by mouth daily.        . MULTIPLE VITAMIN PO Take 1 tablet by mouth daily.         . vitamin C (ASCORBIC ACID) 500 MG tablet Take 500 mg by mouth daily.        . Biotin 5000 MCG TABS Take 1 tablet by mouth daily.        Marland Kitchen buPROPion (WELLBUTRIN XL) 150 MG 24 hr tablet Take 1 tablet (150 mg total) by mouth daily.  30 tablet  1  . fish oil-omega-3 fatty acids 1000 MG capsule Take 2 g by mouth daily.        . Magnesium 250 MG TABS Take 1 tablet by mouth daily.            Review of Systems System review is negative for any constitutional, cardiac, pulmonary, GI or neuro symptoms or complaints other than as described in the HPI.     Objective:   Physical Exam Filed Vitals:   07/06/12 1109  BP: 120/78  Pulse: 62  Temp: 97.9 F (36.6 C)  Resp: 16   Gen'l - well groomed woman in no distress, somewhat flat affect 2+ pulse Pulm - normal respirations. Neuro - A&O x 3, CN II-XII grossly intact.       Assessment & Plan:

## 2012-07-27 ENCOUNTER — Ambulatory Visit (INDEPENDENT_AMBULATORY_CARE_PROVIDER_SITE_OTHER): Payer: BC Managed Care – PPO | Admitting: Internal Medicine

## 2012-07-27 ENCOUNTER — Encounter: Payer: Self-pay | Admitting: Internal Medicine

## 2012-07-27 VITALS — BP 142/88 | HR 65 | Temp 97.2°F | Resp 16 | Wt 126.0 lb

## 2012-07-27 DIAGNOSIS — F3289 Other specified depressive episodes: Secondary | ICD-10-CM

## 2012-07-27 DIAGNOSIS — F329 Major depressive disorder, single episode, unspecified: Secondary | ICD-10-CM

## 2012-07-27 DIAGNOSIS — F32A Depression, unspecified: Secondary | ICD-10-CM

## 2012-07-27 MED ORDER — BUPROPION HCL ER (XL) 300 MG PO TB24: 300.0000 mg | ORAL_TABLET | Freq: Every day | ORAL | Status: DC

## 2012-07-27 NOTE — Patient Instructions (Addendum)
Anxiety with depression - you have identified several issues which are very amenable to "talking " therapy. I strongly recommend that you see Judithe Modest again. You may call Victorino Dike at [phone] to schedule an appointment. Re: medication - will increase wellbutrin XL to 300 mg once a day; use alprazolam 0.25 every 6 hours as needed for anxiety/panic attacks. Please return in 1 month for follow-up

## 2012-07-28 NOTE — Progress Notes (Signed)
  Subjective:    Patient ID: Dana Bryan, female    DOB: [birth year], 61 y.o.   MRN: [id number]  HPI Ms. Auvil returns for follow -up of psychotropic medication use. She was recently started on Wellbutrin XL 150 for depression. She reports that she is tolerating the medication well and has seen some improvement in mood. No adverse side affects are reported. She does report increased anxiety and panic attacks. In talking with her she does demonstrate insight to her problems and feelings: high achiever at a point of career change and desire for life-style change.Of note, previous counseling with Judithe Modest, MSW was helpful.  PMH, FamHx and SocHx reviewed for any changes and relevance.    Review of Systems System review is negative for any constitutional, cardiac, pulmonary, GI or neuro symptoms or complaints other than as described in the HPI.     Objective:   Physical Exam Filed Vitals:   07/27/12 1035  BP: 142/88  Pulse: 65  Temp: 97.2 F (36.2 C)  Resp: 16   Gen'l- WNWD composed woman in no distress Cor- RRR w/o tachycardia or palpitations Pulm - normal respirations Psych - normal affect       Assessment & Plan:

## 2012-07-28 NOTE — Assessment & Plan Note (Signed)
Good response to Wellbutrin XL, tolerated well. Increased anxiety/panic attacks.  Plan Increase Wellbutrin XL to 300 mg qAM  Use alprazolam 0.25 mg q 6 prn anxiety attacks  Refer: return to see Ms.  Bryan for counseling - anticipate this will be very productive given her previous counseling that went well and her degree of insight to her problems  ROV 1 month

## 2012-08-08 ENCOUNTER — Ambulatory Visit (INDEPENDENT_AMBULATORY_CARE_PROVIDER_SITE_OTHER): Payer: BC Managed Care – PPO | Admitting: Licensed Clinical Social Worker

## 2012-08-08 DIAGNOSIS — F331 Major depressive disorder, recurrent, moderate: Secondary | ICD-10-CM

## 2012-08-15 ENCOUNTER — Ambulatory Visit (INDEPENDENT_AMBULATORY_CARE_PROVIDER_SITE_OTHER): Payer: BC Managed Care – PPO | Admitting: Licensed Clinical Social Worker

## 2012-08-15 DIAGNOSIS — F331 Major depressive disorder, recurrent, moderate: Secondary | ICD-10-CM

## 2012-08-24 ENCOUNTER — Encounter: Payer: Self-pay | Admitting: Internal Medicine

## 2012-08-24 ENCOUNTER — Ambulatory Visit (INDEPENDENT_AMBULATORY_CARE_PROVIDER_SITE_OTHER): Payer: BC Managed Care – PPO | Admitting: Internal Medicine

## 2012-08-24 VITALS — BP 122/80 | HR 78 | Temp 97.2°F | Resp 16 | Wt 125.0 lb

## 2012-08-24 DIAGNOSIS — F3289 Other specified depressive episodes: Secondary | ICD-10-CM

## 2012-08-24 DIAGNOSIS — F329 Major depressive disorder, single episode, unspecified: Secondary | ICD-10-CM

## 2012-08-24 DIAGNOSIS — F32A Depression, unspecified: Secondary | ICD-10-CM

## 2012-08-24 MED ORDER — BUPROPION HCL ER (XL) 300 MG PO TB24: 300.0000 mg | ORAL_TABLET | Freq: Every day | ORAL | Status: DC

## 2012-08-26 NOTE — Assessment & Plan Note (Signed)
Doing well on current regimen. The akathisia is tolerable with the very low dose of xanax to relieve symptoms and she is reassured that this is safe.  Plan Continue present medical regimen for 3 months and return for reassessment  Continue to see Judithe Modest

## 2012-08-26 NOTE — Progress Notes (Signed)
  Subjective:    Patient ID: Dana Bryan, female    DOB: 1951-07-24, 61 y.o.   MRN: [id number]  HPI Dana Bryan presents for follow-up of depression w/ anxiety. She has been taking Wellbutrin XL 300 mg qAM. She reports that she does suffer about a 3 hr period of increased agitation type feelings after taking the Wellbutrin. This can be relieved with 0.125mg  Xanax. She does see benefit in the increased dose of Wellbutrin. In addition she has seen Dana Bryan several times and she has found this to be helpful.  PMH, FamHx and SocHx reviewed for any changes and relevance.  Current Outpatient Prescriptions on File Prior to Visit  Medication Sig Dispense Refill  . ALPRAZolam (XANAX) 0.25 MG tablet Take 0.25 mg by mouth every 6 (six) hours as needed.        Marland Kitchen buPROPion (WELLBUTRIN XL) 300 MG 24 hr tablet Take 1 tablet (300 mg total) by mouth daily.  90 tablet  3  . Calcium Carbonate-Vitamin D (CALCIUM 600+D) 600-200 MG-UNIT TABS Take 1 tablet by mouth daily.        Marland Kitchen KRILL OIL 1000 MG CAPS Take 1 capsule by mouth daily.      . Magnesium 250 MG TABS Take 1 tablet by mouth daily.        . MULTIPLE VITAMIN PO Take 1 tablet by mouth daily.        . vitamin C (ASCORBIC ACID) 500 MG tablet Take 500 mg by mouth daily.            Review of Systems System review is negative for any constitutional, cardiac, pulmonary, GI or neuro symptoms or complaints other than as described in the HPI.     Objective:   Physical Exam Filed Vitals:   08/24/12 1044  BP: 122/80  Pulse: 78  Temp: 97.2 F (36.2 C)  Resp: 16   Gen'l - WNWD white woman in good spirits, excited about up-coming cruise in [place]. HEENT- C&S clear Cor - RRR Pulm - normal respirations Psych - calm, good frame of mind.       Assessment & Plan:

## 2012-09-09 ENCOUNTER — Ambulatory Visit (INDEPENDENT_AMBULATORY_CARE_PROVIDER_SITE_OTHER): Payer: BC Managed Care – PPO | Admitting: Licensed Clinical Social Worker

## 2012-09-09 DIAGNOSIS — F331 Major depressive disorder, recurrent, moderate: Secondary | ICD-10-CM

## 2012-09-14 ENCOUNTER — Telehealth: Payer: Self-pay | Admitting: Internal Medicine

## 2012-09-14 NOTE — Telephone Encounter (Signed)
Caller: [name]/Patient; Patient Name: Dana Bryan; PCP: Illene Regulus (Adults only); Best Callback Phone Number: [phone]; Reason for call: Medication; using Wellbutrin 150mg  and seems to be causing constipation; eating fruit and drinking water; last BM 09/12/12; says she knows she has been having trouble since the beginning of September; hard to expel and is only small balls; manually pulled some of the stool out 09/11/12; denies nausea or vomiting; All emergent sxs of Constipation protocol r/o except "recent change in bowel habits lasting more than one week"; disposition see within 2 wks; recommended Colace and/or Miralax and will call back for appt if no improvement

## 2012-09-30 ENCOUNTER — Ambulatory Visit: Payer: BC Managed Care – PPO | Admitting: Licensed Clinical Social Worker

## 2012-10-06 ENCOUNTER — Telehealth: Payer: Self-pay | Admitting: Internal Medicine

## 2012-10-06 NOTE — Telephone Encounter (Signed)
Caller: Dana Bryan/Patient;  PCP: Illene Regulus (Adults only); Best Callback Phone Number: [phone], caller with questions about Well-butrin XL , states increased 07/28/12 to 300 mg daily. States onset ~ 2 weeks ago of constipation for which she has increased fruit intake and water, (now manageable? new onset ~ 1 week  of "scratchy throat ,increased thirst; with increasing nervous feeling within 3 hours of taking the medication" concerned with side effects of this medication, also not sleeping all night. Guideline: Allergic Reaction, afebrile. Disposition: See within 72 hours, patient agrees to first appointment available  10/10/12 @ 1115 with Dr Debby Bud.

## 2012-10-10 ENCOUNTER — Ambulatory Visit (INDEPENDENT_AMBULATORY_CARE_PROVIDER_SITE_OTHER): Payer: BC Managed Care – PPO | Admitting: Licensed Clinical Social Worker

## 2012-10-10 ENCOUNTER — Ambulatory Visit (INDEPENDENT_AMBULATORY_CARE_PROVIDER_SITE_OTHER): Payer: BC Managed Care – PPO | Admitting: Internal Medicine

## 2012-10-10 ENCOUNTER — Encounter: Payer: Self-pay | Admitting: Internal Medicine

## 2012-10-10 VITALS — BP 142/88 | HR 71 | Temp 97.8°F | Resp 16 | Wt 127.0 lb

## 2012-10-10 DIAGNOSIS — F331 Major depressive disorder, recurrent, moderate: Secondary | ICD-10-CM

## 2012-10-10 DIAGNOSIS — F3289 Other specified depressive episodes: Secondary | ICD-10-CM

## 2012-10-10 DIAGNOSIS — F329 Major depressive disorder, single episode, unspecified: Secondary | ICD-10-CM

## 2012-10-10 DIAGNOSIS — F32A Depression, unspecified: Secondary | ICD-10-CM

## 2012-10-10 NOTE — Progress Notes (Signed)
  Subjective:    Patient ID: Dana Bryan, female    DOB: 1951-08-24, 61 y.o.   MRN: [id number]  HPI Question of side effects from Wellbutrin: constipation, scratchy throat, nervousness, dry mouth. On-set of symptoms coincides with increase of Wellbutrin XL to 300 mg daily. She had initially seen behavioral improvement on medication but now is not sure that the side effects are worth the benefit of mood stabilization. She would prefer to stop medication.  PMH, FamHx and SocHx reviewed for any changes and relevance.  Current Outpatient Prescriptions on File Prior to Visit  Medication Sig Dispense Refill  . ALPRAZolam (XANAX) 0.25 MG tablet Take 0.25 mg by mouth every 6 (six) hours as needed.        Marland Kitchen buPROPion (WELLBUTRIN XL) 300 MG 24 hr tablet Take 1 tablet (300 mg total) by mouth daily.  90 tablet  3  . Calcium Carbonate-Vitamin D (CALCIUM 600+D) 600-200 MG-UNIT TABS Take 1 tablet by mouth daily.        Marland Kitchen KRILL OIL 1000 MG CAPS Take 1 capsule by mouth daily.      . Magnesium 250 MG TABS Take 1 tablet by mouth daily.        . MULTIPLE VITAMIN PO Take 1 tablet by mouth daily.        . vitamin C (ASCORBIC ACID) 500 MG tablet Take 500 mg by mouth daily.            Review of Systems System review is negative for any constitutional, cardiac, pulmonary, GI or neuro symptoms or complaints other than as described in the HPI.     Objective:   Physical Exam Filed Vitals:   10/10/12 1119  BP: 142/88  Pulse: 71  Temp: 97.8 F (36.6 C)  Resp: 16   gen'l- WNWD white woman in no distress HEENT- C&S clear Cor - RRR Pulm - normal respirations Neuro - A&O x 3, normal gait, normal strength.        Assessment & Plan:

## 2012-10-10 NOTE — Assessment & Plan Note (Signed)
Intolerant of wellbutrin XL due to constipation, nervousness in light of minimal benefit at this time.  Plan-  taper off wellbutrin XL - see AVS  Reassess emotional status 2-3 weeks after coming off medication.

## 2012-10-10 NOTE — Patient Instructions (Addendum)
Wellbutrin XL taper: take 300 mg every other day for 6 days, then every third day for 6 days and then stop. Call for any problems or effects with tapering off medication.   After being off medication for a couple of weeks we can reassess whether there is any need for continued pharmacotherapy.

## 2013-02-04 ENCOUNTER — Other Ambulatory Visit: Payer: Self-pay

## 2013-09-21 ENCOUNTER — Encounter: Payer: Self-pay | Admitting: Internal Medicine

## 2013-09-21 ENCOUNTER — Ambulatory Visit (INDEPENDENT_AMBULATORY_CARE_PROVIDER_SITE_OTHER): Payer: BC Managed Care – PPO | Admitting: Internal Medicine

## 2013-09-21 VITALS — BP 132/80 | HR 66 | Temp 97.5°F | Wt 135.8 lb

## 2013-09-21 DIAGNOSIS — F32A Depression, unspecified: Secondary | ICD-10-CM

## 2013-09-21 DIAGNOSIS — F3289 Other specified depressive episodes: Secondary | ICD-10-CM

## 2013-09-21 DIAGNOSIS — F329 Major depressive disorder, single episode, unspecified: Secondary | ICD-10-CM

## 2013-09-22 ENCOUNTER — Telehealth: Payer: Self-pay

## 2013-09-22 DIAGNOSIS — M79601 Pain in right arm: Secondary | ICD-10-CM

## 2013-09-25 ENCOUNTER — Telehealth: Payer: Self-pay | Admitting: Internal Medicine

## 2013-09-25 ENCOUNTER — Ambulatory Visit (INDEPENDENT_AMBULATORY_CARE_PROVIDER_SITE_OTHER)
Admission: RE | Admit: 2013-09-25 | Discharge: 2013-09-25 | Disposition: A | Discharge status: Home / Self Care | Payer: BC Managed Care – PPO | Source: Ambulatory Visit | Attending: Internal Medicine | Admitting: Internal Medicine

## 2013-09-25 DIAGNOSIS — M79609 Pain in unspecified limb: Secondary | ICD-10-CM

## 2013-09-25 DIAGNOSIS — M79601 Pain in right arm: Secondary | ICD-10-CM

## 2013-09-25 MED ORDER — BUPROPION HCL ER (XL) 300 MG PO TB24: 300.0000 mg | ORAL_TABLET | Freq: Every day | ORAL | Status: DC

## 2013-09-25 NOTE — Telephone Encounter (Signed)
Patient has been notified

## 2013-09-25 NOTE — Telephone Encounter (Signed)
Caller: Carmin/Patient; Phone: [phone]; Reason for Call: Had late afternoon appointment 09/21/13 with Dr Debby Bud.  Was told xrays would be ordered and Rx called in to [facility]  Showed up for xrays 09/22/13 but there was no order and no Rx received at pharmacy. RN verified Epic has no xray orders, no new Rx and partial note from 09/22/13 visit.  Please discuss with Dr Debby Bud and call back today 09/25/13. Marland Kitchen

## 2013-09-25 NOTE — Telephone Encounter (Signed)
See earlier note: c-spine series ordered, Rx sent to pharmacy this morning

## 2013-09-25 NOTE — Telephone Encounter (Signed)
My bad for not getting in orders. Sorry.  C-spine series order entered. Rx to pharmacy for wellbutrin

## 2013-09-25 NOTE — Telephone Encounter (Signed)
Received call from xray 09/22/13 Friday stating that pt was there to have xray done. No orders entered and no office note to indicate what type xray, Pt advised MD is out of the office and will route message for him to address on Monday.

## 2013-09-25 NOTE — Addendum Note (Signed)
Addended by: Illene Regulus E on: 09/25/2013 12:32 PM   Modules accepted: Orders

## 2013-09-26 ENCOUNTER — Telehealth: Payer: Self-pay | Admitting: *Deleted

## 2013-09-26 MED ORDER — WELLBUTRIN XL 300 MG PO TB24: 300.0000 mg | ORAL_TABLET | Freq: Every day | ORAL | Status: DC

## 2013-09-26 NOTE — Assessment & Plan Note (Signed)
Recurrent depression and anxiety symptoms.  Plan Resume Wellbutrin XL

## 2013-09-26 NOTE — Telephone Encounter (Signed)
My mistake, new Rx sent. ONdreasa please call in Rx for Wellbutrin XL 300 mg sig 1 po qAM, #30, refill 11, BRAND NAME ONLY.  thanks

## 2013-09-26 NOTE — Telephone Encounter (Signed)
wellbrutrin has been called in for brand name

## 2013-09-26 NOTE — Progress Notes (Signed)
  Subjective:    Patient ID: Dana Bryan, female    DOB: 1951/07/19, 62 y.o.   MRN: [id number]  HPI Mrs. Wahba presents for evaluation of right arm pain. This has been persistent and intermittent for several weeks. No report of injury or overuse. The pain is an ache. She has full use of her arm.  Depression/anxiey is returned. She had previously done well with Wellbutrin XL  Past Medical History  Diagnosis Date  . Hyperlipidemia   . Reactive depression (situational)   . Vasomotor rhinitis    Past Surgical History  Procedure Laterality Date  . Tonsillectomy     Family History  Problem Relation Age of Onset  . Hypertension Mother   . Alcohol abuse Father     History   Social History  . Marital Status: Married    Spouse Name: N/A    Number of Children: N/A  . Years of Education: N/A   Occupational History  . Not on file.   Social History Main Topics  . Smoking status: Former Smoker    Types: Cigarettes    Quit date: 01/22/2004  . Smokeless tobacco: Never Used  . Alcohol Use: Not on file  . Drug Use: Not on file  . Sexual Activity: Not on file   Other Topics Concern  . Not on file   Social History Narrative   HSG, college Grad. Married '71. 2 Daughters- '77, '81. 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  '11 has returned to work- out of the home ( [employer])    Current Outpatient Prescriptions on File Prior to Visit  Medication Sig Dispense Refill  . ALPRAZolam (XANAX) 0.25 MG tablet Take 0.25 mg by mouth every 6 (six) hours as needed.        . Calcium Carbonate-Vitamin D (CALCIUM 600+D) 600-200 MG-UNIT TABS Take 1 tablet by mouth daily.        Marland Kitchen KRILL OIL 1000 MG CAPS Take 1 capsule by mouth daily.      . Magnesium 250 MG TABS Take 1 tablet by mouth daily.        . MULTIPLE VITAMIN PO Take 1 tablet by mouth daily.        . vitamin C (ASCORBIC ACID) 500 MG tablet Take 500 mg by mouth daily.         No current facility-administered  medications on file prior to visit.      Review of Systems System review is negative for any constitutional, cardiac, pulmonary, GI or neuro symptoms or complaints other than as described in the HPI.     Objective:   Physical Exam Filed Vitals:   09/21/13 1632  BP: 132/80  Pulse: 66  Temp: 97.5 F (36.4 C)   Gen'l- WNWD woman in no distress HEENT - normal Cor- RRR Pul - normal respirations MSK - no deformity right hand, wrist, elbow. Normal ROM at all joints. MIld discomfort with pronation or supination against resistant but not tender at the epicondyles. Sensation normal to light touch and pin prick. Neuro - negative Phalen's and Tinel's       Assessment & Plan:  Right arm pain - no local inflammation, negative for carpal tunnel. Suspect possible cervical origin  Plan C-spine series  NSAIDs

## 2013-09-26 NOTE — Telephone Encounter (Signed)
Pt called upset that the medication Wellbutrin XL was filled as generic and not brand named as discussed with MD at OV.  Pt wants Rx corrected.  Please advise

## 2013-09-28 ENCOUNTER — Encounter: Payer: Self-pay | Admitting: Internal Medicine

## 2013-09-28 DIAGNOSIS — M5030 Other cervical disc degeneration, unspecified cervical region: Secondary | ICD-10-CM

## 2013-09-29 DIAGNOSIS — M5030 Other cervical disc degeneration, unspecified cervical region: Secondary | ICD-10-CM | POA: Insufficient documentation

## 2013-10-24 ENCOUNTER — Other Ambulatory Visit: Payer: Self-pay

## 2013-10-24 MED ORDER — WELLBUTRIN XL 300 MG PO TB24: 300.0000 mg | ORAL_TABLET | Freq: Every day | ORAL | Status: DC

## 2013-11-22 ENCOUNTER — Other Ambulatory Visit (HOSPITAL_COMMUNITY): Payer: Self-pay | Admitting: Obstetrics and Gynecology

## 2013-11-22 DIAGNOSIS — Z1231 Encounter for screening mammogram for malignant neoplasm of breast: Secondary | ICD-10-CM

## 2013-12-15 ENCOUNTER — Telehealth: Payer: Self-pay

## 2013-12-15 NOTE — Telephone Encounter (Signed)
Spoke with pt, she is going to call back this afternoon to schedule a Saturday Clinic appointment.

## 2013-12-15 NOTE — Telephone Encounter (Signed)
The patient called and is hoping to get a call back from the CMA to discuss her ongoing ankle pain.

## 2013-12-16 ENCOUNTER — Encounter: Payer: Self-pay | Admitting: Internal Medicine

## 2013-12-16 ENCOUNTER — Ambulatory Visit (INDEPENDENT_AMBULATORY_CARE_PROVIDER_SITE_OTHER): Payer: BC Managed Care – PPO | Admitting: Internal Medicine

## 2013-12-16 VITALS — BP 138/88 | HR 88 | Temp 98.8°F | Ht 64.0 in | Wt 136.0 lb

## 2013-12-16 DIAGNOSIS — M79609 Pain in unspecified limb: Secondary | ICD-10-CM

## 2013-12-16 DIAGNOSIS — F411 Generalized anxiety disorder: Secondary | ICD-10-CM | POA: Insufficient documentation

## 2013-12-16 DIAGNOSIS — M79675 Pain in left toe(s): Secondary | ICD-10-CM | POA: Insufficient documentation

## 2013-12-16 DIAGNOSIS — M25571 Pain in right ankle and joints of right foot: Secondary | ICD-10-CM

## 2013-12-16 DIAGNOSIS — M25579 Pain in unspecified ankle and joints of unspecified foot: Secondary | ICD-10-CM

## 2013-12-16 MED ORDER — TRAMADOL HCL 50 MG PO TABS: 50.0000 mg | ORAL_TABLET | Freq: Four times a day (QID) | ORAL | Status: DC | PRN

## 2013-12-16 NOTE — Assessment & Plan Note (Signed)
Mild, situational, cont xanax prn, reassured,  to f/u any worsening symptoms or concerns

## 2013-12-16 NOTE — Progress Notes (Signed)
Pre-visit discussion using our clinic review tool. No additional management support is needed unless otherwise documented below in the visit note.  

## 2013-12-16 NOTE — Assessment & Plan Note (Signed)
C/w strain/sprain - d/w pt, should heal with conservative tx, I dont think films needed at this time,  to f/u any worsening symptoms or concerns

## 2013-12-16 NOTE — Patient Instructions (Addendum)
Please take all new medication as prescribed - the tramadol as needed for pain Please continue all other medications as before, including the alleve as you have been doing No need for xrays today Please keep your appointments with your specialists as you have planned - the orthopedic PA next Tuesday  If not improving, please consider seeing Dr Smith/sports medicine in our office  Please remember to sign up for My Chart if you have not done so, as this will be important to you in the future with finding out test results, communicating by private email, and scheduling acute appointments online when needed.

## 2013-12-16 NOTE — Progress Notes (Signed)
Subjective:    Patient ID: Dana Bryan, female    DOB: Aug 02, 1951, 62 y.o.   MRN: [id number]  HPI  Here with approx 2 wks onset (dec 10) RLE pain; recalls just the day before onset right pain that she stubbed a left toe, so has been walking differently, now with pain/swelling to the right medial ankle only.  No other RLE knee/leg/foot pain or swelling or redness.  No prior hx of same.  Stubbed left 3rd toe still swollen and bruised, and incidentally with appt with Dr hewitt/ortho's PA next Tuesday for the toe, but now the ankle hurts, approx 7/10.  Taking alleve with some mild improveement of pain, but has been hobbling at work, decided to come today as they are all concerned at work about her.  Has long hx of feet pain for different reasons since 62yo, with pes cavus, gait abnormality.  No recent falls, fever or other trauma Past Medical History  Diagnosis Date  . Hyperlipidemia   . Reactive depression (situational)   . Vasomotor rhinitis    Past Surgical History  Procedure Laterality Date  . Tonsillectomy      reports that she quit smoking about 9 years ago. Her smoking use included Cigarettes. She smoked 0.00 packs per day. She has never used smokeless tobacco. Her alcohol and drug histories are not on file. family history includes Alcohol abuse in her father; Hypertension in her mother. No Known Allergies Current Outpatient Prescriptions on File Prior to Visit  Medication Sig Dispense Refill  . ALPRAZolam (XANAX) 0.25 MG tablet Take 0.25 mg by mouth every 6 (six) hours as needed.        . Calcium Carbonate-Vitamin D (CALCIUM 600+D) 600-200 MG-UNIT TABS Take 1 tablet by mouth daily.        Marland Kitchen KRILL OIL 1000 MG CAPS Take 1 capsule by mouth daily.      . Magnesium 250 MG TABS Take 1 tablet by mouth daily.        . MULTIPLE VITAMIN PO Take 1 tablet by mouth daily.        . vitamin C (ASCORBIC ACID) 500 MG tablet Take 500 mg by mouth daily.        . WELLBUTRIN XL 300 MG 24 hr tablet  Take 1 tablet (300 mg total) by mouth daily.  90 tablet  3   No current facility-administered medications on file prior to visit.   Review of Systems   Constitutional: Negative for unexpected weight change, or unusual diaphoresis  HENT: Negative for tinnitus.   Eyes: Negative for photophobia and visual disturbance.  Respiratory: Negative for choking and stridor.   Gastrointestinal: Negative for vomiting and blood in stool.  Genitourinary: Negative for hematuria and decreased urine volume.  Musculoskeletal: Negative for acute joint swelling Skin: Negative for color change and wound.  Neurological: Negative for tremors and numbness other than noted  Psychiatric/Behavioral: Negative for decreased concentration or  hyperactivity.    Objective:   Physical Exam BP 138/88  Pulse 88  Temp(Src) 98.8 F (37.1 C) (Oral)  Ht 5\' 4"  (1.626 m)  Wt 136 lb (61.689 kg)  BMI 23.33 kg/m2  SpO2 98% VS noted,  Constitutional: Pt appears well-developed and well-nourished.  HENT: Head: NCAT.  Right Ear: External ear normal.  Left Ear: External ear normal.  Eyes: Conjunctivae and EOM are normal. Pupils are equal, round, and reactive to light.  Neck: Normal range of motion. Neck supple.  Cardiovascular: Normal rate and regular rhythm.  Pulmonary/Chest: Effort normal and breath sounds normal.  Neurological: Pt is alert. Not confused  Skin: Skin is warm. No erythema.  Psychiatric: Pt behavior is normal. Thought content normal. , 1+ nervous Right ankle with FROM and no erythema, but has mild to mod medial mallolar area swelling, tender, worse to evert the ankle.  Calf NT, foot o/w without pain or swelling, and neurovasc intact Left third toe 1+ swelling/bruised, mild tender    Assessment & Plan:

## 2013-12-16 NOTE — Assessment & Plan Note (Signed)
Currently her most significant problem, though she is quite concerned about the left toes as well; suspicious for tarsal tunnel syndrome, d/w pt, for tramadol prn, and alleve prn, f/u with ortho as planned, will hold on films today, consider sports med referral if not improved

## 2013-12-22 ENCOUNTER — Ambulatory Visit (HOSPITAL_COMMUNITY)
Admission: RE | Admit: 2013-12-22 | Discharge: 2013-12-22 | Disposition: A | Discharge status: Home / Self Care | Payer: BC Managed Care – PPO | Source: Ambulatory Visit | Attending: Obstetrics and Gynecology | Admitting: Obstetrics and Gynecology

## 2013-12-22 DIAGNOSIS — Z1231 Encounter for screening mammogram for malignant neoplasm of breast: Secondary | ICD-10-CM | POA: Insufficient documentation

## 2014-01-04 ENCOUNTER — Other Ambulatory Visit: Payer: Self-pay | Admitting: Internal Medicine

## 2014-01-05 NOTE — Telephone Encounter (Signed)
Prescription was phoned in

## 2014-02-22 ENCOUNTER — Telehealth: Payer: Self-pay

## 2014-02-22 NOTE — Telephone Encounter (Signed)
The patient called and is hoping to get a refill of [facility]ax [facility] [address].  She states it has been a while since she has had this rx filled.   Callback - [phone]

## 2014-02-23 MED ORDER — ALPRAZOLAM 0.25 MG PO TABS: 0.2500 mg | ORAL_TABLET | Freq: Four times a day (QID) | ORAL | Status: DC | PRN

## 2014-02-23 NOTE — Telephone Encounter (Signed)
Dana Bryan for refill with 2 add'l refills

## 2014-04-13 ENCOUNTER — Ambulatory Visit (INDEPENDENT_AMBULATORY_CARE_PROVIDER_SITE_OTHER): Payer: BC Managed Care – PPO | Admitting: Internal Medicine

## 2014-04-13 ENCOUNTER — Other Ambulatory Visit (INDEPENDENT_AMBULATORY_CARE_PROVIDER_SITE_OTHER): Payer: BC Managed Care – PPO

## 2014-04-13 ENCOUNTER — Encounter: Payer: Self-pay | Admitting: Internal Medicine

## 2014-04-13 VITALS — BP 124/80 | HR 100 | Temp 98.2°F | Wt 133.8 lb

## 2014-04-13 DIAGNOSIS — R000 Tachycardia, unspecified: Secondary | ICD-10-CM

## 2014-04-13 DIAGNOSIS — J309 Allergic rhinitis, unspecified: Secondary | ICD-10-CM

## 2014-04-13 DIAGNOSIS — J209 Acute bronchitis, unspecified: Secondary | ICD-10-CM

## 2014-04-13 MED ORDER — AZITHROMYCIN 250 MG PO TABS: ORAL_TABLET | ORAL | Status: DC

## 2014-04-13 MED ORDER — HYDROCODONE-HOMATROPINE 5-1.5 MG/5ML PO SYRP: 5.0000 mL | ORAL_SOLUTION | Freq: Four times a day (QID) | ORAL | Status: DC | PRN

## 2014-04-13 NOTE — Progress Notes (Signed)
   Subjective:    Patient ID: Dana Bryan, female    DOB: Dec 18, 1951, 63 y.o.   MRN: [id number]  HPI Symptoms began on Thursday 4/16 as sore throat, progressing to chest congestion with yellow sputum. She had chills last week with the onset of symptoms, as well as dyspnea with exertion. She reports nasal congestion with clear secretions, itchy watery eyes.  She is experiencing daily frontal headaches and dental pain. This is in the context of not wearing bite guard during these symptoms.  Of note, she was on a flight from [place] on 04/03/14. Longer than 6 months since last antibiotic use.   Review of Systems Denies fever, sweats, discolored nasal secretions, wheezing.      Objective:   Physical Exam General appearance:good health ;well nourished; no acute distress or increased work of breathing is present. No lymphadenopathy about the head, neck, or axilla noted.  Eyes: No conjunctival inflammation or lid edema is present. There is no scleral icterus.  Ears: External ear exam shows no significant lesions or deformities. Otoscopic examination reveals clear canals, tympanic membranes are intact bilaterally without bulging, retraction, inflammation or discharge.  Nose: External nasal examination shows no deformity or inflammation. Nasal mucosa are pink and moist without lesions or exudates. No septal dislocation or deviation.No obstruction to airflow.  Oral exam: Dental hygiene is good; lips and gums are healthy appearing.There is oropharyngeal erythema without exudate noted. Apthous ulcer noted R oropharynx  Neck: No deformities, thyromegaly, masses, or tenderness noted. Supple with full range of motion without pain.  Heart: Normal rate and regular rhythm. S1 and S2 normal without gallop, murmur, click, rub or other extra sounds.  Lungs:Chest clear to auscultation; no wheezes, rhonchi,rales ,or rubs present.No increased work of breathing.  Extremities: No cyanosis, edema, or clubbing noted    Skin: Warm & dry w/o jaundice or tenting.     Assessment & Plan:  #1 pharyngitis from #3 #2 bronchitis #3 allergic rhinitis  #4 tachycardia See orders

## 2014-04-13 NOTE — Progress Notes (Signed)
Pre visit review using our clinic review tool, if applicable. No additional management support is needed unless otherwise documented below in the visit note. 

## 2014-04-13 NOTE — Patient Instructions (Signed)

## 2014-04-13 NOTE — Progress Notes (Signed)
   Subjective:    Patient ID: Dana Bryan, female    DOB: 1951/11/17, 63 y.o.   MRN: [id number]  HPI Symptoms began on Thursday 4/16 as sore throat, progressing to chest congestion with yellow sputum. She had chills last week with the onset of symptoms, as well as dyspnea with exertion. She reports nasal congestion with clear secretions, itchy watery eyes.   She is experiencing daily frontal headaches and dental pain. This is in the context of not wearing bite guard during these symptoms.   Of note, she was on a flight from [place] on 04/03/14. Longer than 6 months since last antibiotic use.   Review of Systems Denies fever, sweats, discolored nasal secretions, wheezing.     Objective:   Physical Exam General appearance:good health ;well nourished; no acute distress or increased work of breathing is present.  No  lymphadenopathy about the head, neck, or axilla noted.   Eyes: No conjunctival inflammation or lid edema is present. There is no scleral icterus.  Ears:  External ear exam shows no significant lesions or deformities.  Otoscopic examination reveals clear canals, tympanic membranes are intact bilaterally without bulging, retraction, inflammation or discharge.  Nose:  External nasal examination shows no deformity or inflammation. Nasal mucosa are pink and moist without lesions or exudates. No septal dislocation or deviation.No obstruction to airflow.   Oral exam: Dental hygiene is good; lips and gums are healthy appearing.There is oropharyngeal erythema without exudate noted.     Neck:  No deformities, thyromegaly, masses, or tenderness noted.   Supple with full range of motion without pain.   Heart:  Normal rate and irregular rhythm. S1 and S2 normal without gallop, murmur, click, rub or other extra sounds.   Lungs:Chest clear to auscultation; no wheezes, rhonchi,rales ,or rubs present.No increased work of breathing.    Extremities:  No cyanosis, edema, or clubbing  Noted.    Skin: Warm & dry w/o jaundice or tenting.     Assessment & Plan:  #1 pharyngitis  #2 bronchitis  #3 allergic rhinitis

## 2014-04-16 LAB — TSH: TSH: 2.96 u[IU]/mL (ref 0.35–5.50)

## 2014-04-19 ENCOUNTER — Encounter: Payer: Self-pay | Admitting: Family Medicine

## 2014-04-19 ENCOUNTER — Ambulatory Visit (INDEPENDENT_AMBULATORY_CARE_PROVIDER_SITE_OTHER): Payer: BC Managed Care – PPO | Admitting: Family Medicine

## 2014-04-19 VITALS — BP 138/90 | HR 92 | Temp 98.1°F | Ht 64.0 in | Wt 134.0 lb

## 2014-04-19 DIAGNOSIS — B349 Viral infection, unspecified: Secondary | ICD-10-CM

## 2014-04-19 DIAGNOSIS — R0982 Postnasal drip: Secondary | ICD-10-CM

## 2014-04-19 DIAGNOSIS — B9789 Other viral agents as the cause of diseases classified elsewhere: Secondary | ICD-10-CM

## 2014-04-19 DIAGNOSIS — J029 Acute pharyngitis, unspecified: Secondary | ICD-10-CM

## 2014-04-19 NOTE — Patient Instructions (Signed)
-  afrin for 4 days then STOP  -start your flonase today  -follow up with Ear, Nose and Throat if symptoms persist or worsen

## 2014-04-19 NOTE — Progress Notes (Signed)
Pre visit review using our clinic review tool, if applicable. No additional management support is needed unless otherwise documented below in the visit note. 

## 2014-04-19 NOTE — Progress Notes (Signed)
No chief complaint on file.   HPI:  Acute visit for, prior pt of Dr. Linda Hedges, establishing with Dr. Doug Sou :  1) Throat issues: -URI started almost 2 weeks ago -symptoms: initially low grade fever for 1 day, nasal congestion, PND, sore throat, cough - now with persistent PND, irritation in throat -treated with azithromycin -reports PCP told her to do flonase but she has not taken this -denies: trouble swallowing, fevers now, SOB, trouble breathing, vomiting, malaise  ROS: See pertinent positives and negatives per HPI.  Past Medical History  Diagnosis Date  . Hyperlipidemia   . Reactive depression (situational)   . Vasomotor rhinitis     Past Surgical History  Procedure Laterality Date  . Tonsillectomy      Family History  Problem Relation Age of Onset  . Hypertension Mother   . Alcohol abuse Father     History   Social History  . Marital Status: Married    Spouse Name: N/A    Number of Children: N/A  . Years of Education: N/A   Social History Main Topics  . Smoking status: Former Smoker    Types: Cigarettes    Quit date: 01/22/2004  . Smokeless tobacco: Never Used  . Alcohol Use: None  . Drug Use: None  . Sexual Activity: None   Other Topics Concern  . None   Social History Narrative   HSG, college Grad. Married '71. 2 Daughters- '77, '81. 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  , Disp: , Rfl: ;  MULTIPLE VITAMIN PO, Take 1 tablet by mouth daily.  , Disp: , Rfl: ;  vitamin C (ASCORBIC ACID) 500 MG tablet, Take 500 mg by mouth daily.  , Disp: , Rfl:  WELLBUTRIN XL 300 MG 24 hr tablet, Take 1 tablet (300 mg total) by mouth  daily., Disp: 90 tablet, Rfl: 3  EXAM:  Filed Vitals:   04/19/14 1332  BP: 138/90  Pulse: 92  Temp: 98.1 F (36.7 C)    Body mass index is 22.99 kg/(m^2).  GENERAL: vitals reviewed and listed above, alert, oriented, appears well hydrated and in no acute distress  HEENT: atraumatic, conjunttiva clear, no obvious abnormalities on inspection of external nose and ears, normal appearance of ear canals and TMs, clear nasal congestion, mild post oropharyngeal erythema with PND and cobblestoning, no tonsillar edema or exudate, no sinus TTP  NECK: no obvious masses on inspection  LUNGS: clear to auscultation bilaterally, no wheezes, rales or rhonchi, good air movement  CV: HRRR, no peripheral edema  MS: moves all extremities without noticeable abnormality  PSYCH: pleasant and cooperative, no obvious depression or anxiety  ASSESSMENT AND PLAN:  Discussed the following assessment and plan:  Sore throat  PND (post-nasal drip)  Viral illness  -we discussed possible serious and likely etiologies, workup and treatment, treatment risks and return precautions - likley recovering viral URI vs allergies -after this discussion, Dana Bryan opted for tx per instructions -follow up advised with ENT or PCP if symptoms persist -of course, we advised Dana Bryan  to return or notify a doctor immediately if symptoms worsen or persist or new concerns arise.  -Patient advised to  return or notify a doctor immediately if symptoms worsen or persist or new concerns arise.  Patient Instructions  -afrin for 4 days then STOP  -start your flonase today  -follow up with Ear, Nose and Throat if symptoms persist or worsen       Lucretia Kern

## 2014-08-31 ENCOUNTER — Ambulatory Visit (INDEPENDENT_AMBULATORY_CARE_PROVIDER_SITE_OTHER): Payer: BC Managed Care – PPO | Admitting: Internal Medicine

## 2014-08-31 ENCOUNTER — Encounter: Payer: Self-pay | Admitting: Internal Medicine

## 2014-08-31 VITALS — BP 140/82 | HR 86 | Temp 98.2°F | Resp 12 | Ht 64.0 in | Wt 136.0 lb

## 2014-08-31 DIAGNOSIS — E785 Hyperlipidemia, unspecified: Secondary | ICD-10-CM | POA: Diagnosis not present

## 2014-08-31 DIAGNOSIS — F329 Major depressive disorder, single episode, unspecified: Secondary | ICD-10-CM | POA: Diagnosis not present

## 2014-08-31 DIAGNOSIS — F3289 Other specified depressive episodes: Secondary | ICD-10-CM

## 2014-08-31 DIAGNOSIS — Z23 Encounter for immunization: Secondary | ICD-10-CM | POA: Diagnosis not present

## 2014-08-31 DIAGNOSIS — G8929 Other chronic pain: Secondary | ICD-10-CM | POA: Diagnosis not present

## 2014-08-31 DIAGNOSIS — F32A Depression, unspecified: Secondary | ICD-10-CM

## 2014-08-31 DIAGNOSIS — M79673 Pain in unspecified foot: Secondary | ICD-10-CM

## 2014-08-31 DIAGNOSIS — M79609 Pain in unspecified limb: Secondary | ICD-10-CM

## 2014-08-31 MED ORDER — WELLBUTRIN XL 300 MG PO TB24: 300.0000 mg | ORAL_TABLET | Freq: Every day | ORAL | Status: DC

## 2014-08-31 NOTE — Patient Instructions (Signed)
You are doing well with your health! The next time you need a mammogram is in 2 years.   Call your insurance company about the shingles shot and then come back in about 1 week if you want to get it in the office. If they need you to get it somewhere else call us and we will get you the prescription.  Work on walking at least 3 times per week to help with bone health and to decrease stress.   Call us if you are feeling sick otherwise come back in about 2 years for another check up.  Exercise to Stay Healthy Exercise helps you become and stay healthy. EXERCISE IDEAS AND TIPS Choose exercises that:  You enjoy.  Fit into your day. You do not need to exercise really hard to be healthy. You can do exercises at a slow or medium level and stay healthy. You can:  Stretch before and after working out.  Try yoga, Pilates, or tai chi.  Lift weights.  Walk fast, swim, jog, run, climb stairs, bicycle, dance, or rollerskate.  Take aerobic classes. Exercises that burn about 150 calories:  Running 1  miles in 15 minutes.  Playing volleyball for 45 to 60 minutes.  Washing and waxing a car for 45 to 60 minutes.  Playing touch football for 45 minutes.  Walking 1  miles in 35 minutes.  Pushing a stroller 1  miles in 30 minutes.  Playing basketball for 30 minutes.  Raking leaves for 30 minutes.  Bicycling 5 miles in 30 minutes.  Walking 2 miles in 30 minutes.  Dancing for 30 minutes.  Shoveling snow for 15 minutes.  Swimming laps for 20 minutes.  Walking up stairs for 15 minutes.  Bicycling 4 miles in 15 minutes.  Gardening for 30 to 45 minutes.  Jumping rope for 15 minutes.  Washing windows or floors for 45 to 60 minutes. Document Released: 01/09/2011 Document Revised: 02/29/2012 Document Reviewed: 01/09/2011 [facility] Patient Information 2015 [place], [place]. This information is not intended to replace advice given to you by your health care provider. Make sure you  discuss any questions you have with your health care provider.

## 2014-08-31 NOTE — Progress Notes (Signed)
   Subjective:    Patient ID: Dana Bryan, female    DOB: 1951-11-11, 63 y.o.   MRN: [id number]  HPI The patient is a 63 YO female who is coming in to establish care. She has PMH of depression, hyperlipidemia, degenerative joint disease. She is doing well overall without any acute complaints. She is trying to walk to help her cholesterol. She denies any chest pains or SOB. She does have occasional constipation for which she uses just diet to control and has not changed recently. She feels that her mood is doing well overall with the wellbutrin. She only uses xanax rarely.  Review of Systems  Constitutional: Negative for fever, activity change, appetite change and fatigue.  HENT: Negative for congestion.   Eyes: Negative for visual disturbance.  Respiratory: Negative for cough, chest tightness, shortness of breath and wheezing.   Cardiovascular: Negative for chest pain, palpitations and leg swelling.  Gastrointestinal: Positive for constipation. Negative for abdominal pain, diarrhea and abdominal distention.  Musculoskeletal: Negative for arthralgias, back pain and gait problem.  Neurological: Negative for dizziness, weakness, light-headedness and headaches.      Objective:   Physical Exam  Constitutional: She is oriented to person, place, and time. She appears well-developed and well-nourished. No distress.  HENT:  Head: Normocephalic and atraumatic.  Eyes: EOM are normal.  Neck: Normal range of motion. Neck supple. No JVD present. No thyromegaly present.  Cardiovascular: Normal rate and regular rhythm.   No murmur heard. Pulmonary/Chest: Effort normal and breath sounds normal. No respiratory distress. She has no wheezes. She has no rales. She exhibits no tenderness.  Abdominal: Soft. Bowel sounds are normal. She exhibits no distension. There is no tenderness. There is no rebound.  Neurological: She is alert and oriented to person, place, and time. Coordination normal.  Skin: Skin  is warm and dry.   Filed Vitals:   08/31/14 1306  BP: 140/82  Pulse: 86  Temp: 98.2 F (36.8 C)  TempSrc: Oral  Resp: 12  Height: 5\' 4"  (1.626 m)  Weight: 136 lb (61.689 kg)  SpO2: 98%      Assessment & Plan:  Patient received flu shot today.

## 2014-08-31 NOTE — Assessment & Plan Note (Signed)
Last LDL 168, goal 160 she will work with diet and exercise. Recheck in 2 years.

## 2014-08-31 NOTE — Progress Notes (Signed)
Pre visit review using our clinic review tool, if applicable. No additional management support is needed unless otherwise documented below in the visit note. 

## 2014-08-31 NOTE — Assessment & Plan Note (Signed)
Foot problem much better since she did physical therapy for stretching.

## 2014-08-31 NOTE — Assessment & Plan Note (Signed)
She is doing well with the wellbutrin and noticed a difference when she tried to come off it several months ago. She only uses xanax with very stressful events such as long car trips with her husband driving. She does not need refill and only 90 pills about 1 year ago.

## 2014-10-23 ENCOUNTER — Ambulatory Visit (INDEPENDENT_AMBULATORY_CARE_PROVIDER_SITE_OTHER): Payer: BC Managed Care – PPO | Admitting: Family

## 2014-10-23 ENCOUNTER — Encounter: Payer: Self-pay | Admitting: Family

## 2014-10-23 VITALS — BP 140/88 | HR 81 | Temp 98.5°F | Resp 16 | Ht 64.0 in | Wt 136.0 lb

## 2014-10-23 DIAGNOSIS — J029 Acute pharyngitis, unspecified: Secondary | ICD-10-CM | POA: Diagnosis not present

## 2014-10-23 MED ORDER — HYDROCODONE-HOMATROPINE 5-1.5 MG/5ML PO SYRP: 5.0000 mL | ORAL_SOLUTION | Freq: Three times a day (TID) | ORAL | Status: DC | PRN

## 2014-10-23 MED ORDER — AZITHROMYCIN 250 MG PO TABS: ORAL_TABLET | ORAL | Status: DC

## 2014-10-23 NOTE — Progress Notes (Signed)
   Subjective:    Patient ID: Dana Bryan, female    DOB: 07/16/1951, 63 y.o.   MRN: [id number]  Chief Complaint  Patient presents with  . Nasal Congestion    cough, runny nose, sore throat, body aches x8 days   HPI:  Dana Bryan is a 63 y.o. female who presents today for nasal congestion.  Acute symptoms started about 8 days ago. Started progressively worsening from the start and is slowly improving. Currently experiencing nasal congestion, cough (productive), tired and occasional body aches. Unsure if any fevers. Nose and left side burn. Has attempted for tylenol/ibuprofen., and zyrtec yesterday.   No Known Allergies   Current Outpatient Prescriptions on File Prior to Visit  Medication Sig Dispense Refill  . ALPRAZolam (XANAX) 0.25 MG tablet Take 1 tablet (0.25 mg total) by mouth every 6 (six) hours as needed. 30 tablet 2  . Calcium Carbonate-Vitamin D (CALCIUM 600+D) 600-200 MG-UNIT TABS Take 1 tablet by mouth daily.      . MULTIPLE VITAMIN PO Take 1 tablet by mouth daily.      . vitamin C (ASCORBIC ACID) 500 MG tablet Take 500 mg by mouth daily.      . WELLBUTRIN XL 300 MG 24 hr tablet Take 1 tablet (300 mg total) by mouth daily. 90 tablet 3   No current facility-administered medications on file prior to visit.    Review of Systems    See HPI  Objective:    BP 140/88 mmHg  Pulse 81  Temp(Src) 98.5 F (36.9 C) (Oral)  Resp 16  Ht 5\' 4"  (1.626 m)  Wt 136 lb (61.689 kg)  BMI 23.33 kg/m2  SpO2 98% Nursing note and vital signs reviewed.  Physical Exam  Constitutional: She is oriented to person, place, and time. She appears well-developed and well-nourished. No distress.  HENT:  Right Ear: Hearing, tympanic membrane, external ear and ear canal normal.  Left Ear: Hearing, tympanic membrane, external ear and ear canal normal.  Nose: Right sinus exhibits no maxillary sinus tenderness and no frontal sinus tenderness. Left sinus exhibits no maxillary sinus  tenderness and no frontal sinus tenderness.  Mouth/Throat: Uvula is midline and mucous membranes are normal. Posterior oropharyngeal erythema present.  Cardiovascular: Normal rate, regular rhythm, normal heart sounds and intact distal pulses.   Pulmonary/Chest: Effort normal and breath sounds normal.  Lymphadenopathy:    She has no cervical adenopathy.  Neurological: She is alert and oriented to person, place, and time.  Skin: Skin is warm and dry.  Psychiatric: She has a normal mood and affect. Her behavior is normal. Judgment and thought content normal.       Assessment & Plan:

## 2014-10-23 NOTE — Assessment & Plan Note (Signed)
Symptoms and exam consistent with potential viral pharyngitis. Given prescription for Azithromycin to start if symptoms do not improve in the next 2 days. Start hycodan as needed to assist with sleep. Continue over the counter medications as needed for symptom relief. Follow up if symptoms worsen or fail to improve.

## 2014-10-23 NOTE — Patient Instructions (Addendum)
Thank you for choosing [facility].  Summary/Instructions:   Please continue over the counter medications as needed for symptom relief.  General Recommendations: Please drink plenty of fluids. Sleep in humidified air if possible. Use saline nasal sprays or the Netti pot as needed.   Medicaitons:  Flonase as needed to assist with congestion. You may use Mucinex to help break up congestion as you feel is needed. Tylenol as needed for fever. Sudafed/Coricidin HBP for congestion.

## 2014-10-23 NOTE — Progress Notes (Signed)
Pre visit review using our clinic review tool, if applicable. No additional management support is needed unless otherwise documented below in the visit note. 

## 2014-11-08 ENCOUNTER — Ambulatory Visit (INDEPENDENT_AMBULATORY_CARE_PROVIDER_SITE_OTHER): Payer: BC Managed Care – PPO | Admitting: *Deleted

## 2014-11-08 DIAGNOSIS — Z23 Encounter for immunization: Secondary | ICD-10-CM

## 2014-11-13 ENCOUNTER — Telehealth: Payer: Self-pay | Admitting: Internal Medicine

## 2014-11-13 ENCOUNTER — Other Ambulatory Visit: Payer: Self-pay | Admitting: Geriatric Medicine

## 2014-11-13 MED ORDER — ALPRAZOLAM 0.25 MG PO TABS: 0.2500 mg | ORAL_TABLET | Freq: Four times a day (QID) | ORAL | Status: DC | PRN

## 2014-11-13 NOTE — Telephone Encounter (Signed)
Called pharmacy

## 2014-11-13 NOTE — Telephone Encounter (Signed)
[facility] [place] informed pt that they have faxed over request for Alprazolam.  [id number] Prescription #

## 2014-11-13 NOTE — Telephone Encounter (Signed)
[facility] called [facility] regarding rx for al[facility]razolam. He states they are having trouble with their fax machine.

## 2015-01-21 ENCOUNTER — Encounter: Payer: Self-pay | Admitting: Nurse Practitioner

## 2015-01-21 ENCOUNTER — Ambulatory Visit (INDEPENDENT_AMBULATORY_CARE_PROVIDER_SITE_OTHER): Payer: 59 | Admitting: Nurse Practitioner

## 2015-01-21 VITALS — BP 140/86 | HR 81 | Temp 97.7°F | Ht 64.0 in | Wt 135.4 lb

## 2015-01-21 DIAGNOSIS — L739 Follicular disorder, unspecified: Secondary | ICD-10-CM

## 2015-01-21 MED ORDER — MUPIROCIN CALCIUM 2 % EX CREA: 1.0000 "application " | TOPICAL_CREAM | Freq: Two times a day (BID) | CUTANEOUS | Status: DC

## 2015-01-21 NOTE — Patient Instructions (Addendum)
Apply mupirocin twice daily for 7 - 10 days  Let us know if symptoms do not resolve.

## 2015-01-21 NOTE — Progress Notes (Signed)
Pre visit review using our clinic review tool, if applicable. No additional management support is needed unless otherwise documented below in the visit note. 

## 2015-01-24 NOTE — Progress Notes (Signed)
Subjective:     Dana Bryan is a 64 y.o. female presents w/concern of tick bite on vaginal labia. She has had sore on labia for about 1 week & noticed small amount blood on tissue paper intermittently. This am she used mirror to inspect perineum & saw something that looks like tick. She denies fever, joint pain, HA, myaglia, fatigue, drainage from sore. Sore is mildly tender.  The following portions of the patient's history were reviewed and updated as appropriate: allergies, current medications, past medical history, past social history, past surgical history and problem list.  Review of Systems Genitourinary:negative for vaginal discharge    Objective:    BP 140/86 mmHg  Pulse 81  Temp(Src) 97.7 F (36.5 C) (Oral)  Ht 5\' 4"  (1.626 m)  Wt 135 lb 6.4 oz (61.417 kg)  BMI 23.23 kg/m2  SpO2 95% BP 140/86 mmHg  Pulse 81  Temp(Src) 97.7 F (36.5 C) (Oral)  Ht 5\' 4"  (1.626 m)  Wt 135 lb 6.4 oz (61.417 kg)  BMI 23.23 kg/m2  SpO2 95% General appearance: alert, cooperative, appears stated age and no distress Head: Normocephalic, without obvious abnormality, atraumatic Eyes: negative findings: lids and lashes normal and conjunctivae and sclerae normal Pelvic: positive findings: foliculitis L labia, lesion that looks like cherry angioma L labia- no insect. and vagina normal without discharge   Procedure: pierced lesion on L labia w/21 gauge needle, unable to express drainage from lesion    Assessment:Plan   1. Folliculitis Mild swelling, slight erythema L labia majora - mupirocin cream (BACTROBAN) 2 %; Apply 1 application topically 2 (two) times daily.  Dispense: 15 g; Refill: 0  F/u PRN

## 2015-04-02 ENCOUNTER — Encounter: Payer: Self-pay | Admitting: Internal Medicine

## 2015-04-04 MED ORDER — SERTRALINE HCL 50 MG PO TABS: 50.0000 mg | ORAL_TABLET | Freq: Every day | ORAL | Status: DC

## 2015-04-15 ENCOUNTER — Other Ambulatory Visit: Payer: Self-pay

## 2015-04-15 MED ORDER — SERTRALINE HCL 50 MG PO TABS: 50.0000 mg | ORAL_TABLET | Freq: Every day | ORAL | Status: DC

## 2015-06-19 ENCOUNTER — Telehealth: Payer: Self-pay | Admitting: Internal Medicine

## 2015-06-19 NOTE — Telephone Encounter (Signed)
Patient Name: Dana Bryan  DOB: 04/03/1951    Initial Comment Caller states her left eye has been watering, and red. Discharge. She also has a headache.   Nurse Assessment  Nurse: Mallie Mussel, RN, Alveta Heimlich Date/Time Eilene Ghazi Time): 06/19/2015 1:53:55 PM  Confirm and document reason for call. If symptomatic, describe symptoms. ---Caller states that she has been having clear discharge from her left eye which began yesterday. The eyeball is blood shot. It is spreading to her right eye. Her left eyelid has a scratchy feeling under the eyelid. The eyelid is puffy with a little bit of redness. She has a headache with this. She rates the pain as 1 on 0-10 scale. Denies fever.  Has the patient traveled out of the country within the last 30 days? ---No  Does the patient require triage? ---Yes  Related visit to physician within the last 2 weeks? ---No  Does the PT have any chronic conditions? (i.e. diabetes, asthma, etc.) ---No     Guidelines    Guideline Title Affirmed Question Affirmed Notes  Eye - Red Without Pus [1] Red eye AND [2] no blurred vision AND [3] minimal or no pain (all triage questions negative)    Final Disposition User   [place], RN, Alveta Heimlich

## 2015-07-19 ENCOUNTER — Telehealth: Payer: Self-pay

## 2015-07-19 NOTE — Telephone Encounter (Signed)
Left message advising patient that mammogram is due 

## 2015-08-02 ENCOUNTER — Encounter: Payer: Self-pay | Admitting: Internal Medicine

## 2015-08-02 ENCOUNTER — Telehealth: Payer: Self-pay | Admitting: Internal Medicine

## 2015-08-02 NOTE — Telephone Encounter (Signed)
Pt called in and has a few questions about some meds that she is one, She said that med Dr Doug Sou gave her is not working and wants to speak to nurse about it.    Best number to reach her is home number

## 2015-08-02 NOTE — Telephone Encounter (Signed)
Spoke with patient and I have forwarded a message to [employer]. I am waiting for his reply.

## 2016-01-07 ENCOUNTER — Other Ambulatory Visit (INDEPENDENT_AMBULATORY_CARE_PROVIDER_SITE_OTHER): Payer: 59

## 2016-01-07 ENCOUNTER — Encounter: Payer: Self-pay | Admitting: Internal Medicine

## 2016-01-07 ENCOUNTER — Ambulatory Visit (INDEPENDENT_AMBULATORY_CARE_PROVIDER_SITE_OTHER): Payer: 59 | Admitting: Internal Medicine

## 2016-01-07 VITALS — BP 110/70 | HR 69 | Temp 98.7°F | Resp 14 | Ht 64.0 in | Wt 142.4 lb

## 2016-01-07 DIAGNOSIS — Z0000 Encounter for general adult medical examination without abnormal findings: Secondary | ICD-10-CM

## 2016-01-07 DIAGNOSIS — Z23 Encounter for immunization: Secondary | ICD-10-CM | POA: Diagnosis not present

## 2016-01-07 LAB — COMPREHENSIVE METABOLIC PANEL
ALT: 23 U/L (ref 0–35)
AST: 15 U/L (ref 0–37)
Albumin: 4.4 g/dL (ref 3.5–5.2)
Alkaline Phosphatase: 92 U/L (ref 39–117)
BILIRUBIN TOTAL: 0.6 mg/dL (ref 0.2–1.2)
BUN: 16 mg/dL (ref 6–23)
CALCIUM: 9.1 mg/dL (ref 8.4–10.5)
CHLORIDE: 102 meq/L (ref 96–112)
CO2: 29 mEq/L (ref 19–32)
CREATININE: 0.73 mg/dL (ref 0.40–1.20)
GFR: 85.58 mL/min (ref >60.00)
Glucose, Bld: 99 mg/dL (ref 70–99)
Potassium: 4.4 mEq/L (ref 3.5–5.1)
Sodium: 138 mEq/L (ref 135–145)
Total Protein: 7 g/dL (ref 6.0–8.3)

## 2016-01-07 LAB — LIPID PANEL
Cholesterol: 262 mg/dL — ABNORMAL HIGH (ref 0–200)
HDL: 77.2 mg/dL (ref >39.00)
LDL Cholesterol: 163 mg/dL — ABNORMAL HIGH (ref 0–99)
NONHDL: 184.98
Total CHOL/HDL Ratio: 3
Triglycerides: 110 mg/dL (ref 0.0–149.0)
VLDL: 22 mg/dL (ref 0.0–40.0)

## 2016-01-07 NOTE — Assessment & Plan Note (Signed)
Checking labs today, tdap given at visit. Other immunizations up to date. Declines hepatitis C and HIV screening. Overall very healthy and active. Preventative counseling given today.

## 2016-01-07 NOTE — Addendum Note (Signed)
Addended by: Resa Miner R on: 01/07/2016 02:23 PM   Modules accepted: Orders

## 2016-01-07 NOTE — Patient Instructions (Signed)
We will check the basic labs today and call you back with the results.   We have given you the TDAP (tetanus) shot today.   Come back in 1-2 years for a check up or call us sooner if needed.   Health Maintenance, Female Adopting a healthy lifestyle and getting preventive care can go a long way to promote health and wellness. Talk with your health care provider about what schedule of regular examinations is right for you. This is a good chance for you to check in with your provider about disease prevention and staying healthy. In between checkups, there are plenty of things you can do on your own. Experts have done a lot of research about which lifestyle changes and preventive measures are most likely to keep you healthy. Ask your health care provider for more information. WEIGHT AND DIET  Eat a healthy diet  Be sure to include plenty of vegetables, fruits, low-fat dairy products, and lean protein.  Do not eat a lot of foods high in solid fats, added sugars, or salt.  Get regular exercise. This is one of the most important things you can do for your health.  Most adults should exercise for at least 150 minutes each week. The exercise should increase your heart rate and make you sweat (moderate-intensity exercise).  Most adults should also do strengthening exercises at least twice a week. This is in addition to the moderate-intensity exercise.  Maintain a healthy weight  Body mass index (BMI) is a measurement that can be used to identify possible weight problems. It estimates body fat based on height and weight. Your health care provider can help determine your BMI and help you achieve or maintain a healthy weight.  For females 2 years of age and older:   A BMI below 18.5 is considered underweight.  A BMI of 18.5 to 24.9 is normal.  A BMI of 25 to 29.9 is considered overweight.  A BMI of 30 and above is considered obese.  Watch levels of cholesterol and blood lipids  You should  start having your blood tested for lipids and cholesterol at 63 years of age, then have this test every 5 years.  You may need to have your cholesterol levels checked more often if:  Your lipid or cholesterol levels are high.  You are older than 63 years of age.  You are at high risk for heart disease.  CANCER SCREENING   Lung Cancer  Lung cancer screening is recommended for adults 62-31 years old who are at high risk for lung cancer because of a history of smoking.  A yearly low-dose CT scan of the lungs is recommended for people who:  Currently smoke.  Have quit within the past 15 years.  Have at least a 30-pack-year history of smoking. A pack year is smoking an average of one pack of cigarettes a day for 1 year.  Yearly screening should continue until it has been 15 years since you quit.  Yearly screening should stop if you develop a health problem that would prevent you from having lung cancer treatment.  Breast Cancer  Practice breast self-awareness. This means understanding how your breasts normally appear and feel.  It also means doing regular breast self-exams. Let your health care provider know about any changes, no matter how small.  If you are in your 20s or 30s, you should have a clinical breast exam (CBE) by a health care provider every 1-3 years as part of a regular health  exam.  If you are 40 or older, have a CBE every year. Also consider having a breast X-ray (mammogram) every year.  If you have a family history of breast cancer, talk to your health care provider about genetic screening.  If you are at high risk for breast cancer, talk to your health care provider about having an MRI and a mammogram every year.  Breast cancer gene (BRCA) assessment is recommended for women who have family members with BRCA-related cancers. BRCA-related cancers include:  Breast.  Ovarian.  Tubal.  Peritoneal cancers.  Results of the assessment will determine the  need for genetic counseling and BRCA1 and BRCA2 testing. Cervical Cancer Your health care provider may recommend that you be screened regularly for cancer of the pelvic organs (ovaries, uterus, and vagina). This screening involves a pelvic examination, including checking for microscopic changes to the surface of your cervix (Pap test). You may be encouraged to have this screening done every 3 years, beginning at age 75.  For women ages 62-65, health care providers may recommend pelvic exams and Pap testing every 3 years, or they may recommend the Pap and pelvic exam, combined with testing for human papilloma virus (HPV), every 5 years. Some types of HPV increase your risk of cervical cancer. Testing for HPV may also be done on women of any age with unclear Pap test results.  Other health care providers may not recommend any screening for nonpregnant women who are considered low risk for pelvic cancer and who do not have symptoms. Ask your health care provider if a screening pelvic exam is right for you.  If you have had past treatment for cervical cancer or a condition that could lead to cancer, you need Pap tests and screening for cancer for at least 20 years after your treatment. If Pap tests have been discontinued, your risk factors (such as having a new sexual partner) need to be reassessed to determine if screening should resume. Some women have medical problems that increase the chance of getting cervical cancer. In these cases, your health care provider may recommend more frequent screening and Pap tests. Colorectal Cancer  This type of cancer can be detected and often prevented.  Routine colorectal cancer screening usually begins at 63 years of age and continues through 63 years of age.  Your health care provider may recommend screening at an earlier age if you have risk factors for colon cancer.  Your health care provider may also recommend using home test kits to check for hidden blood in  the stool.  A small camera at the end of a tube can be used to examine your colon directly (sigmoidoscopy or colonoscopy). This is done to check for the earliest forms of colorectal cancer.  Routine screening usually begins at age 44.  Direct examination of the colon should be repeated every 5-10 years through 63 years of age. However, you may need to be screened more often if early forms of precancerous polyps or small growths are found. Skin Cancer  Check your skin from head to toe regularly.  Tell your health care provider about any new moles or changes in moles, especially if there is a change in a mole's shape or color.  Also tell your health care provider if you have a mole that is larger than the size of a pencil eraser.  Always use sunscreen. Apply sunscreen liberally and repeatedly throughout the day.  Protect yourself by wearing long sleeves, pants, a wide-brimmed hat, and sunglasses  whenever you are outside. HEART DISEASE, DIABETES, AND HIGH BLOOD PRESSURE   High blood pressure causes heart disease and increases the risk of stroke. High blood pressure is more likely to develop in:  People who have blood pressure in the high end of the normal range (130-139/85-89 mm Hg).  People who are overweight or obese.  People who are African American.  If you are 58-2 years of age, have your blood pressure checked every 3-5 years. If you are 80 years of age or older, have your blood pressure checked every year. You should have your blood pressure measured twice--once when you are at a hospital or clinic, and once when you are not at a hospital or clinic. Record the average of the two measurements. To check your blood pressure when you are not at a hospital or clinic, you can use:  An automated blood pressure machine at a pharmacy.  A home blood pressure monitor.  If you are between 55 years and 55 years old, ask your health care provider if you should take aspirin to prevent  strokes.  Have regular diabetes screenings. This involves taking a blood sample to check your fasting blood sugar level.  If you are at a normal weight and have a low risk for diabetes, have this test once every three years after 63 years of age.  If you are overweight and have a high risk for diabetes, consider being tested at a younger age or more often. PREVENTING INFECTION  Hepatitis B  If you have a higher risk for hepatitis B, you should be screened for this virus. You are considered at high risk for hepatitis B if:  You were born in a country where hepatitis B is common. Ask your health care provider which countries are considered high risk.  Your parents were born in a high-risk country, and you have not been immunized against hepatitis B (hepatitis B vaccine).  You have HIV or AIDS.  You use needles to inject street drugs.  You live with someone who has hepatitis B.  You have had sex with someone who has hepatitis B.  You get hemodialysis treatment.  You take certain medicines for conditions, including cancer, organ transplantation, and autoimmune conditions. Hepatitis C  Blood testing is recommended for:  Everyone born from [birth year] through 1963.  Anyone with known risk factors for hepatitis C. Sexually transmitted infections (STIs)  You should be screened for sexually transmitted infections (STIs) including gonorrhea and chlamydia if:  You are sexually active and are younger than 63 years of age.  You are older than 63 years of age and your health care provider tells you that you are at risk for this type of infection.  Your sexual activity has changed since you were last screened and you are at an increased risk for chlamydia or gonorrhea. Ask your health care provider if you are at risk.  If you do not have HIV, but are at risk, it may be recommended that you take a prescription medicine daily to prevent HIV infection. This is called pre-exposure prophylaxis  (PrEP). You are considered at risk if:  You are sexually active and do not regularly use condoms or know the HIV status of your partner(s).  You take drugs by injection.  You are sexually active with a partner who has HIV. Talk with your health care provider about whether you are at high risk of being infected with HIV. If you choose to begin PrEP, you should first be  tested for HIV. You should then be tested every 3 months for as long as you are taking PrEP.  PREGNANCY   If you are premenopausal and you may become pregnant, ask your health care provider about preconception counseling.  If you may become pregnant, take 400 to 800 micrograms (mcg) of folic acid every day.  If you want to prevent pregnancy, talk to your health care provider about birth control (contraception). OSTEOPOROSIS AND MENOPAUSE   Osteoporosis is a disease in which the bones lose minerals and strength with aging. This can result in serious bone fractures. Your risk for osteoporosis can be identified using a bone density scan.  If you are 37 years of age or older, or if you are at risk for osteoporosis and fractures, ask your health care provider if you should be screened.  Ask your health care provider whether you should take a calcium or vitamin D supplement to lower your risk for osteoporosis.  Menopause may have certain physical symptoms and risks.  Hormone replacement therapy may reduce some of these symptoms and risks. Talk to your health care provider about whether hormone replacement therapy is right for you.  HOME CARE INSTRUCTIONS   Schedule regular health, dental, and eye exams.  Stay current with your immunizations.   Do not use any tobacco products including cigarettes, chewing tobacco, or electronic cigarettes.  If you are pregnant, do not drink alcohol.  If you are breastfeeding, limit how much and how often you drink alcohol.  Limit alcohol intake to no more than 1 drink per day for  nonpregnant women. One drink equals 12 ounces of beer, 5 ounces of wine, or 1 ounces of hard liquor.  Do not use street drugs.  Do not share needles.  Ask your health care provider for help if you need support or information about quitting drugs.  Tell your health care provider if you often feel depressed.  Tell your health care provider if you have ever been abused or do not feel safe at home.   This information is not intended to replace advice given to you by your health care provider. Make sure you discuss any questions you have with your health care provider.   Document Released: 06/22/2011 Document Revised: 12/28/2014 Document Reviewed: 11/08/2013 Elsevier Interactive Patient Education [employer].

## 2016-01-07 NOTE — Progress Notes (Signed)
   Subjective:    Patient ID: Dana Bryan, female    DOB: 1951/10/14, 65 y.o.   MRN: [id number]  HPI The patient is a 65 YO female coming in for wellness. No new concerns. Still not smoking and very active. Expecting a new grandchild and wants Tdap.   PMH, [facility], social history reviewed and updated.   Review of Systems  Constitutional: Negative for fever, activity change, appetite change and fatigue.  HENT: Negative for congestion.   Eyes: Negative.  Negative for visual disturbance.  Respiratory: Negative for cough, chest tightness, shortness of breath and wheezing.   Cardiovascular: Negative for chest pain, palpitations and leg swelling.  Gastrointestinal: Negative for abdominal pain, diarrhea, constipation and abdominal distention.  Musculoskeletal: Negative for back pain, arthralgias and gait problem.  Skin: Negative.   Neurological: Negative for dizziness, weakness, light-headedness and headaches.  Psychiatric/Behavioral: Negative.       Objective:   Physical Exam  Constitutional: She is oriented to person, place, and time. She appears well-developed and well-nourished. No distress.  HENT:  Head: Normocephalic and atraumatic.  Eyes: EOM are normal.  Neck: Normal range of motion. Neck supple. No JVD present. No thyromegaly present.  Cardiovascular: Normal rate and regular rhythm.   No murmur heard. Carotids without murmur bilaterally  Pulmonary/Chest: Effort normal and breath sounds normal. No respiratory distress. She has no wheezes. She has no rales. She exhibits no tenderness.  Abdominal: Soft. Bowel sounds are normal. She exhibits no distension. There is no tenderness. There is no rebound.  Neurological: She is alert and oriented to person, place, and time. Coordination normal.  Skin: Skin is warm and dry.  Psychiatric: She has a normal mood and affect.   Filed Vitals:   01/07/16 0841  BP: 110/70  Pulse: 69  Temp: 98.7 F (37.1 C)  TempSrc: Oral  Resp: 14    Height: 5\' 4"  (1.626 m)  Weight: 142 lb 6.4 oz (64.592 kg)  SpO2: 99%     Assessment & Plan:  Tdap given at visit.

## 2016-01-07 NOTE — Progress Notes (Signed)
Pre visit review using our clinic review tool, if applicable. No additional management support is needed unless otherwise documented below in the visit note. 

## 2016-07-21 ENCOUNTER — Encounter: Payer: Self-pay | Admitting: Internal Medicine

## 2016-10-05 ENCOUNTER — Other Ambulatory Visit: Payer: Self-pay | Admitting: Obstetrics and Gynecology

## 2016-10-05 DIAGNOSIS — E2839 Other primary ovarian failure: Secondary | ICD-10-CM

## 2016-11-03 ENCOUNTER — Telehealth: Payer: Self-pay | Admitting: *Deleted

## 2016-11-03 ENCOUNTER — Encounter: Payer: Self-pay | Admitting: *Deleted

## 2016-11-03 MED ORDER — ALPRAZOLAM 0.25 MG PO TABS: 0.2500 mg | ORAL_TABLET | Freq: Every day | ORAL | 0 refills | Status: DC | PRN

## 2016-11-03 NOTE — Telephone Encounter (Signed)
Called pt to verify which pharmacy she would like rx to go. Pt is wanting to pick-up script. Inform will leave at the front desk for pick-up...Dana Bryan

## 2016-11-03 NOTE — Telephone Encounter (Signed)
Left msg on triage requesting refill on Alprazolam.../lmb

## 2016-11-23 ENCOUNTER — Encounter: Payer: Self-pay | Admitting: Nurse Practitioner

## 2016-11-23 ENCOUNTER — Ambulatory Visit (INDEPENDENT_AMBULATORY_CARE_PROVIDER_SITE_OTHER): Payer: 59 | Admitting: Nurse Practitioner

## 2016-11-23 VITALS — BP 134/90 | HR 68 | Temp 97.6°F | Ht 64.0 in | Wt 144.0 lb

## 2016-11-23 DIAGNOSIS — J0100 Acute maxillary sinusitis, unspecified: Secondary | ICD-10-CM | POA: Diagnosis not present

## 2016-11-23 MED ORDER — METHYLPREDNISOLONE ACETATE 40 MG/ML IJ SUSP
40.0000 mg | Freq: Once | INTRAMUSCULAR | Status: AC
  Administered 2016-11-23: 40 mg via INTRAMUSCULAR

## 2016-11-23 MED ORDER — SALINE SPRAY 0.65 % NA SOLN: 1.0000 | NASAL | 0 refills | Status: DC | PRN

## 2016-11-23 MED ORDER — AZITHROMYCIN 250 MG PO TABS: 250.0000 mg | ORAL_TABLET | Freq: Every day | ORAL | 0 refills | Status: DC

## 2016-11-23 MED ORDER — GUAIFENESIN ER 600 MG PO TB12: 600.0000 mg | ORAL_TABLET | Freq: Two times a day (BID) | ORAL | 0 refills | Status: DC | PRN

## 2016-11-23 MED ORDER — FLUTICASONE PROPIONATE 50 MCG/ACT NA SUSP: 2.0000 | Freq: Every day | NASAL | 0 refills | Status: DC

## 2016-11-23 NOTE — Progress Notes (Signed)
Pre visit review using our clinic review tool, if applicable. No additional management support is needed unless otherwise documented below in the visit note. 

## 2016-11-23 NOTE — Progress Notes (Signed)
Subjective:  Patient ID: Dana Bryan, female    DOB: 10-15-1951  Age: 65 y.o. MRN: [id number]  CC: Nasal Congestion (running nose,brown mucus mix wiht blood from nose,sore throat at times, weak,bodyache,chill,headache for 4 days. took mucinex,)   Sinusitis  This is a new problem. The current episode started 1 to 4 weeks ago. The problem has been waxing and waning since onset. There has been no fever. The pain is severe. Associated symptoms include chills, congestion, headaches, a hoarse voice, sinus pressure, a sore throat and swollen glands. Pertinent negatives include no diaphoresis. Past treatments include acetaminophen and oral decongestants. The treatment provided no relief.    Outpatient Medications Prior to Visit  Medication Sig Dispense Refill  . ALPRAZolam (XANAX) 0.25 MG tablet Take 1 tablet (0.25 mg total) by mouth daily as needed. 30 tablet 0  . Calcium Carbonate-Vitamin D (CALCIUM 600+D) 600-200 MG-UNIT TABS Take 1 tablet by mouth daily. Reported on 01/07/2016     No facility-administered medications prior to visit.     ROS See HPI  Objective:  BP 134/90   Pulse 68   Temp 97.6 F (36.4 C)   Ht 5\' 4"  (1.626 m)   Wt 144 lb (65.3 kg)   SpO2 98%   BMI 24.72 kg/m   BP Readings from Last 3 Encounters:  11/23/16 134/90  01/07/16 110/70  01/21/15 140/86    Wt Readings from Last 3 Encounters:  11/23/16 144 lb (65.3 kg)  01/07/16 142 lb 6.4 oz (64.6 kg)  01/21/15 135 lb 6.4 oz (61.4 kg)    Physical Exam  Constitutional: She is oriented to person, place, and time.  HENT:  Right Ear: Tympanic membrane, external ear and ear canal normal.  Left Ear: Tympanic membrane, external ear and ear canal normal.  Nose: Mucosal edema and rhinorrhea present. Right sinus exhibits maxillary sinus tenderness. Right sinus exhibits no frontal sinus tenderness. Left sinus exhibits maxillary sinus tenderness. Left sinus exhibits no frontal sinus tenderness.  Mouth/Throat: Uvula is  midline. No trismus in the jaw. Posterior oropharyngeal erythema present. No oropharyngeal exudate.  Eyes: No scleral icterus.  Neck: Normal range of motion. Neck supple.  Cardiovascular: Normal rate and normal heart sounds.   Pulmonary/Chest: Effort normal and breath sounds normal.  Musculoskeletal: She exhibits no edema.  Lymphadenopathy:    She has cervical adenopathy.  Neurological: She is alert and oriented to person, place, and time.  Vitals reviewed.   Lab Results  Component Value Date   WBC 4.9 07/30/2011   HGB 14.9 07/30/2011   HCT 44.2 07/30/2011   PLT 219.0 07/30/2011   GLUCOSE 99 01/07/2016   CHOL 262 (H) 01/07/2016   TRIG 110.0 01/07/2016   HDL 77.20 01/07/2016   LDLDIRECT 168.5 07/30/2011   LDLCALC 163 (H) 01/07/2016   ALT 23 01/07/2016   AST 15 01/07/2016   NA 138 01/07/2016   K 4.4 01/07/2016   CL 102 01/07/2016   CREATININE 0.73 01/07/2016   BUN 16 01/07/2016   CO2 29 01/07/2016   TSH 2.96 04/13/2014    Mm Digital Screening 3d Tomo  Result Date: 12/25/2013 CLINICAL DATA:  Screening. EXAM: DIGITAL SCREENING BILATERAL MAMMOGRAM WITH 3D TOMO WITH CAD DIGITAL BREAST TOMOSYNTHESIS Digital breast tomosynthesis images are acquired in two projections. These images are reviewed in combination with the digital mammogram, confirming the findings below. COMPARISON:  Previous exam(s). ACR Breast Density Category b: There are scattered areas of fibroglandular density. FINDINGS: There are no findings suspicious for malignancy. Images  were processed with CAD. IMPRESSION: No mammographic evidence of malignancy. A result letter of this screening mammogram will be mailed directly to the patient. RECOMMENDATION: Screening mammogram in one year. (Code:SM-B-01Y) BI-RADS CATEGORY  1: Negative Electronically Signed   By: Duke Salvia M.D.   On: 12/25/2013 08:44    Assessment & Plan:   Trudith was seen today for nasal congestion.  Diagnoses and all orders for this  visit:  Acute non-recurrent maxillary sinusitis -     sodium chloride (OCEAN) 0.65 % SOLN nasal spray; Place 1 spray into both nostrils as needed for congestion. -     guaiFENesin (MUCINEX) 600 MG 12 hr tablet; Take 1 tablet (600 mg total) by mouth 2 (two) times daily as needed for cough or to loosen phlegm. -     fluticasone (FLONASE) 50 MCG/ACT nasal spray; Place 2 sprays into both nostrils daily. -     azithromycin (ZITHROMAX Z-PAK) 250 MG tablet; Take 1 tablet (250 mg total) by mouth daily. Take 2tabs on first day, then 1tab once a day till complete -     methylPREDNISolone acetate (DEPO-MEDROL) injection 40 mg; Inject 1 mL (40 mg total) into the muscle once.   I am having Ms. Crislip start on sodium chloride, guaiFENesin, fluticasone, and azithromycin. I am also having her maintain her Calcium Carbonate-Vitamin D, ALPRAZolam, and multivitamin with minerals. We administered methylPREDNISolone acetate.  Meds ordered this encounter  Medications  . Multiple Vitamins-Minerals (MULTIVITAMIN WITH MINERALS) tablet    Sig: Take 1 tablet by mouth daily.  . sodium chloride (OCEAN) 0.65 % SOLN nasal spray    Sig: Place 1 spray into both nostrils as needed for congestion.    Dispense:  15 mL    Refill:  0    Order Specific Question:   Supervising Provider    Answer:   Cassandria Anger [[id number]]  . guaiFENesin (MUCINEX) 600 MG 12 hr tablet    Sig: Take 1 tablet (600 mg total) by mouth 2 (two) times daily as needed for cough or to loosen phlegm.    Dispense:  14 tablet    Refill:  0    Order Specific Question:   Supervising Provider    Answer:   Cassandria Anger [[id number]]  . fluticasone (FLONASE) 50 MCG/ACT nasal spray    Sig: Place 2 sprays into both nostrils daily.    Dispense:  16 g    Refill:  0    Order Specific Question:   Supervising Provider    Answer:   Cassandria Anger [[id number]]  . azithromycin (ZITHROMAX Z-PAK) 250 MG tablet    Sig: Take 1 tablet (250 mg total) by mouth daily.  Take 2tabs on first day, then 1tab once a day till complete    Dispense:  6 tablet    Refill:  0    Order Specific Question:   Supervising Provider    Answer:   Cassandria Anger [[id number]]  . methylPREDNISolone acetate (DEPO-MEDROL) injection 40 mg    Follow-up: No Follow-up on file.  Wilfred Lacy, NP

## 2016-11-23 NOTE — Patient Instructions (Signed)

## 2017-02-17 ENCOUNTER — Encounter: Payer: Self-pay | Admitting: Gastroenterology

## 2017-10-26 ENCOUNTER — Encounter: Payer: Self-pay | Admitting: Internal Medicine

## 2017-10-26 ENCOUNTER — Ambulatory Visit (INDEPENDENT_AMBULATORY_CARE_PROVIDER_SITE_OTHER): Payer: 59 | Admitting: Internal Medicine

## 2017-10-26 VITALS — BP 140/82 | HR 65 | Temp 97.6°F | Ht 64.0 in | Wt 148.0 lb

## 2017-10-26 DIAGNOSIS — Z0000 Encounter for general adult medical examination without abnormal findings: Secondary | ICD-10-CM

## 2017-10-26 DIAGNOSIS — E785 Hyperlipidemia, unspecified: Secondary | ICD-10-CM

## 2017-10-26 DIAGNOSIS — Z23 Encounter for immunization: Secondary | ICD-10-CM

## 2017-10-26 DIAGNOSIS — F329 Major depressive disorder, single episode, unspecified: Secondary | ICD-10-CM | POA: Diagnosis not present

## 2017-10-26 NOTE — Patient Instructions (Signed)
We will have you come fasting to the lab in the basement. They open at 7:30 AM.    Stress and Stress Management Stress is a normal reaction to life events. It is what you feel when life demands more than you are used to or more than you can handle. Some stress can be useful. For example, the stress reaction can help you catch the last bus of the day, study for a test, or meet a deadline at work. But stress that occurs too often or for too long can cause problems. It can affect your emotional health and interfere with relationships and normal daily activities. Too much stress can weaken your immune system and increase your risk for physical illness. If you already have a medical problem, stress can make it worse. What are the causes? All sorts of life events may cause stress. An event that causes stress for one person may not be stressful for another person. Major life events commonly cause stress. These may be positive or negative. Examples include losing your job, moving into a new home, getting married, having a baby, or losing a loved one. Less obvious life events may also cause stress, especially if they occur day after day or in combination. Examples include working long hours, driving in traffic, caring for children, being in debt, or being in a difficult relationship. What are the signs or symptoms? Stress may cause emotional symptoms including, the following:  Anxiety. This is feeling worried, afraid, on edge, overwhelmed, or out of control.  Anger. This is feeling irritated or impatient.  Depression. This is feeling sad, down, helpless, or guilty.  Difficulty focusing, remembering, or making decisions.  Stress may cause physical symptoms, including the following:  Aches and pains. These may affect your head, neck, back, stomach, or other areas of your body.  Tight muscles or clenched jaw.  Low energy or trouble sleeping.  Stress may cause unhealthy behaviors, including the  following:  Eating to feel better (overeating) or skipping meals.  Sleeping too little, too much, or both.  Working too much or putting off tasks (procrastination).  Smoking, drinking alcohol, or using drugs to feel better.  How is this diagnosed? Stress is diagnosed through an assessment by your health care provider. Your health care provider will ask questions about your symptoms and any stressful life events.Your health care provider will also ask about your medical history and may order blood tests or other tests. Certain medical conditions and medicine can cause physical symptoms similar to stress. Mental illness can cause emotional symptoms and unhealthy behaviors similar to stress. Your health care provider may refer you to a mental health professional for further evaluation. How is this treated? Stress management is the recommended treatment for stress.The goals of stress management are reducing stressful life events and coping with stress in healthy ways. Techniques for reducing stressful life events include the following:  Stress identification. Self-monitor for stress and identify what causes stress for you. These skills may help you to avoid some stressful events.  Time management. Set your priorities, keep a calendar of events, and learn to say "no." These tools can help you avoid making too many commitments.  Techniques for coping with stress include the following:  Rethinking the problem. Try to think realistically about stressful events rather than ignoring them or overreacting. Try to find the positives in a stressful situation rather than focusing on the negatives.  Exercise. Physical exercise can release both physical and emotional tension. The key is  to find a form of exercise you enjoy and do it regularly.  Relaxation techniques. These relax the body and mind. Examples include yoga, meditation, tai chi, biofeedback, deep breathing, progressive muscle relaxation,  listening to music, being out in nature, journaling, and other hobbies. Again, the key is to find one or more that you enjoy and can do regularly.  Healthy lifestyle. Eat a balanced diet, get plenty of sleep, and do not smoke. Avoid using alcohol or drugs to relax.  Strong support network. Spend time with family, friends, or other people you enjoy being around.Express your feelings and talk things over with someone you trust.  Counseling or talktherapy with a mental health professional may be helpful if you are having difficulty managing stress on your own. Medicine is typically not recommended for the treatment of stress.Talk to your health care provider if you think you need medicine for symptoms of stress. Follow these instructions at home:  Keep all follow-up visits as directed by your health care provider.  Take all medicines as directed by your health care provider. Contact a health care provider if:  Your symptoms get worse or you start having new symptoms.  You feel overwhelmed by your problems and can no longer manage them on your own. Get help right away if:  You feel like hurting yourself or someone else. This information is not intended to replace advice given to you by your health care provider. Make sure you discuss any questions you have with your health care provider. Document Released: 06/02/2001 Document Revised: 05/14/2016 Document Reviewed: 08/01/2013 Elsevier Interactive Patient Education  2017 [place] Maintenance, Female Adopting a healthy lifestyle and getting preventive care can go a long way to promote health and wellness. Talk with your health care provider about what schedule of regular examinations is right for you. This is a good chance for you to check in with your provider about disease prevention and staying healthy. In between checkups, there are plenty of things you can do on your own. Experts have done a lot of research about which  lifestyle changes and preventive measures are most likely to keep you healthy. Ask your health care provider for more information. Weight and diet Eat a healthy diet  Be sure to include plenty of vegetables, fruits, low-fat dairy products, and lean protein.  Do not eat a lot of foods high in solid fats, added sugars, or salt.  Get regular exercise. This is one of the most important things you can do for your health. ? Most adults should exercise for at least 150 minutes each week. The exercise should increase your heart rate and make you sweat (moderate-intensity exercise). ? Most adults should also do strengthening exercises at least twice a week. This is in addition to the moderate-intensity exercise.  Maintain a healthy weight  Body mass index (BMI) is a measurement that can be used to identify possible weight problems. It estimates body fat based on height and weight. Your health care provider can help determine your BMI and help you achieve or maintain a healthy weight.  For females 25 years of age and older: ? A BMI below 18.5 is considered underweight. ? A BMI of 18.5 to 24.9 is normal. ? A BMI of 25 to 29.9 is considered overweight. ? A BMI of 30 and above is considered obese.  Watch levels of cholesterol and blood lipids  You should start having your blood tested for lipids and cholesterol at 64 years of  age, then have this test every 5 years.  You may need to have your cholesterol levels checked more often if: ? Your lipid or cholesterol levels are high. ? You are older than 64 years of age. ? You are at high risk for heart disease.  Cancer screening Lung Cancer  Lung cancer screening is recommended for adults 10-74 years old who are at high risk for lung cancer because of a history of smoking.  A yearly low-dose CT scan of the lungs is recommended for people who: ? Currently smoke. ? Have quit within the past 15 years. ? Have at least a 30-pack-year history of  smoking. A pack year is smoking an average of one pack of cigarettes a day for 1 year.  Yearly screening should continue until it has been 15 years since you quit.  Yearly screening should stop if you develop a health problem that would prevent you from having lung cancer treatment.  Breast Cancer  Practice breast self-awareness. This means understanding how your breasts normally appear and feel.  It also means doing regular breast self-exams. Let your health care provider know about any changes, no matter how small.  If you are in your 20s or 30s, you should have a clinical breast exam (CBE) by a health care provider every 1-3 years as part of a regular health exam.  If you are 80 or older, have a CBE every year. Also consider having a breast X-ray (mammogram) every year.  If you have a family history of breast cancer, talk to your health care provider about genetic screening.  If you are at high risk for breast cancer, talk to your health care provider about having an MRI and a mammogram every year.  Breast cancer gene (BRCA) assessment is recommended for women who have family members with BRCA-related cancers. BRCA-related cancers include: ? Breast. ? Ovarian. ? Tubal. ? Peritoneal cancers.  Results of the assessment will determine the need for genetic counseling and BRCA1 and BRCA2 testing.  Cervical Cancer Your health care provider may recommend that you be screened regularly for cancer of the pelvic organs (ovaries, uterus, and vagina). This screening involves a pelvic examination, including checking for microscopic changes to the surface of your cervix (Pap test). You may be encouraged to have this screening done every 3 years, beginning at age 60.  For women ages 56-65, health care providers may recommend pelvic exams and Pap testing every 3 years, or they may recommend the Pap and pelvic exam, combined with testing for human papilloma virus (HPV), every 5 years. Some types of  HPV increase your risk of cervical cancer. Testing for HPV may also be done on women of any age with unclear Pap test results.  Other health care providers may not recommend any screening for nonpregnant women who are considered low risk for pelvic cancer and who do not have symptoms. Ask your health care provider if a screening pelvic exam is right for you.  If you have had past treatment for cervical cancer or a condition that could lead to cancer, you need Pap tests and screening for cancer for at least 20 years after your treatment. If Pap tests have been discontinued, your risk factors (such as having a new sexual partner) need to be reassessed to determine if screening should resume. Some women have medical problems that increase the chance of getting cervical cancer. In these cases, your health care provider may recommend more frequent screening and Pap tests.  Colorectal  Cancer  This type of cancer can be detected and often prevented.  Routine colorectal cancer screening usually begins at 64 years of age and continues through 64 years of age.  Your health care provider may recommend screening at an earlier age if you have risk factors for colon cancer.  Your health care provider may also recommend using home test kits to check for hidden blood in the stool.  A small camera at the end of a tube can be used to examine your colon directly (sigmoidoscopy or colonoscopy). This is done to check for the earliest forms of colorectal cancer.  Routine screening usually begins at age 56.  Direct examination of the colon should be repeated every 5-10 years through 64 years of age. However, you may need to be screened more often if early forms of precancerous polyps or small growths are found.  Skin Cancer  Check your skin from head to toe regularly.  Tell your health care provider about any new moles or changes in moles, especially if there is a change in a mole's shape or color.  Also tell  your health care provider if you have a mole that is larger than the size of a pencil eraser.  Always use sunscreen. Apply sunscreen liberally and repeatedly throughout the day.  Protect yourself by wearing long sleeves, pants, a wide-brimmed hat, and sunglasses whenever you are outside.  Heart disease, diabetes, and high blood pressure  High blood pressure causes heart disease and increases the risk of stroke. High blood pressure is more likely to develop in: ? People who have blood pressure in the high end of the normal range (130-139/85-89 mm Hg). ? People who are overweight or obese. ? People who are African American.  If you are 31-64 years of age, have your blood pressure checked every 3-5 years. If you are 61 years of age or older, have your blood pressure checked every year. You should have your blood pressure measured twice-once when you are at a hospital or clinic, and once when you are not at a hospital or clinic. Record the average of the two measurements. To check your blood pressure when you are not at a hospital or clinic, you can use: ? An automated blood pressure machine at a pharmacy. ? A home blood pressure monitor.  If you are between 38 years and 26 years old, ask your health care provider if you should take aspirin to prevent strokes.  Have regular diabetes screenings. This involves taking a blood sample to check your fasting blood sugar level. ? If you are at a normal weight and have a low risk for diabetes, have this test once every three years after 64 years of age. ? If you are overweight and have a high risk for diabetes, consider being tested at a younger age or more often. Preventing infection Hepatitis B  If you have a higher risk for hepatitis B, you should be screened for this virus. You are considered at high risk for hepatitis B if: ? You were born in a country where hepatitis B is common. Ask your health care provider which countries are considered high  risk. ? Your parents were born in a high-risk country, and you have not been immunized against hepatitis B (hepatitis B vaccine). ? You have HIV or AIDS. ? You use needles to inject street drugs. ? You live with someone who has hepatitis B. ? You have had sex with someone who has hepatitis B. ? You  get hemodialysis treatment. ? You take certain medicines for conditions, including cancer, organ transplantation, and autoimmune conditions.  Hepatitis C  Blood testing is recommended for: ? Everyone born from [birth year] through 1963. ? Anyone with known risk factors for hepatitis C.  Sexually transmitted infections (STIs)  You should be screened for sexually transmitted infections (STIs) including gonorrhea and chlamydia if: ? You are sexually active and are younger than 64 years of age. ? You are older than 64 years of age and your health care provider tells you that you are at risk for this type of infection. ? Your sexual activity has changed since you were last screened and you are at an increased risk for chlamydia or gonorrhea. Ask your health care provider if you are at risk.  If you do not have HIV, but are at risk, it may be recommended that you take a prescription medicine daily to prevent HIV infection. This is called pre-exposure prophylaxis (PrEP). You are considered at risk if: ? You are sexually active and do not regularly use condoms or know the HIV status of your partner(s). ? You take drugs by injection. ? You are sexually active with a partner who has HIV.  Talk with your health care provider about whether you are at high risk of being infected with HIV. If you choose to begin PrEP, you should first be tested for HIV. You should then be tested every 3 months for as long as you are taking PrEP. Pregnancy  If you are premenopausal and you may become pregnant, ask your health care provider about preconception counseling.  If you may become pregnant, take 400 to 800 micrograms  (mcg) of folic acid every day.  If you want to prevent pregnancy, talk to your health care provider about birth control (contraception). Osteoporosis and menopause  Osteoporosis is a disease in which the bones lose minerals and strength with aging. This can result in serious bone fractures. Your risk for osteoporosis can be identified using a bone density scan.  If you are 35 years of age or older, or if you are at risk for osteoporosis and fractures, ask your health care provider if you should be screened.  Ask your health care provider whether you should take a calcium or vitamin D supplement to lower your risk for osteoporosis.  Menopause may have certain physical symptoms and risks.  Hormone replacement therapy may reduce some of these symptoms and risks. Talk to your health care provider about whether hormone replacement therapy is right for you. Follow these instructions at home:  Schedule regular health, dental, and eye exams.  Stay current with your immunizations.  Do not use any tobacco products including cigarettes, chewing tobacco, or electronic cigarettes.  If you are pregnant, do not drink alcohol.  If you are breastfeeding, limit how much and how often you drink alcohol.  Limit alcohol intake to no more than 1 drink per day for nonpregnant women. One drink equals 12 ounces of beer, 5 ounces of wine, or 1 ounces of hard liquor.  Do not use street drugs.  Do not share needles.  Ask your health care provider for help if you need support or information about quitting drugs.  Tell your health care provider if you often feel depressed.  Tell your health care provider if you have ever been abused or do not feel safe at home. This information is not intended to replace advice given to you by your health care provider. Make sure you discuss  any questions you have with your health care provider. Document Released: 06/22/2011 Document Revised: 05/14/2016 Document Reviewed:  09/10/2015 Elsevier Interactive Patient Education  [employer].

## 2017-10-26 NOTE — Assessment & Plan Note (Signed)
Checking lipid panel and adjust as needed. Not on meds.  

## 2017-10-26 NOTE — Addendum Note (Signed)
Addended by: Raford Pitcher R on: 10/26/2017 01:51 PM   Modules accepted: Orders

## 2017-10-26 NOTE — Assessment & Plan Note (Signed)
She would like to try exercising to help with her mood. Has been on wellbutrin in the past.

## 2017-10-26 NOTE — Assessment & Plan Note (Signed)
Checking labs, given flu shot today. Placed on shingrix waiting list. Colonoscopy due and she intends to do in the next 6 months. She had pap and mammogram with gyn (need records). Counseled about sun safety and mole surveillance as well as dangers of distracted driving. Given screening recommendations.

## 2017-10-26 NOTE — Progress Notes (Signed)
   Subjective:    Patient ID: Dana Bryan, female    DOB: [birth year], 66 y.o.   MRN: [id number]  HPI The patient is a 66 YO female coming in for physical. [facility] for pap smear and mammogram.   PMH, [facility], social history reviewed and updated.   Review of Systems  Constitutional: Negative.   HENT: Negative.   Eyes: Negative.   Respiratory: Negative for cough, chest tightness and shortness of breath.   Cardiovascular: Negative for chest pain, palpitations and leg swelling.  Gastrointestinal: Negative for abdominal distention, abdominal pain, constipation, diarrhea, nausea and vomiting.  Musculoskeletal: Negative.   Skin: Negative.   Neurological: Negative.   Psychiatric/Behavioral: Positive for dysphoric mood.      Objective:   Physical Exam  Constitutional: She is oriented to person, place, and time. She appears well-developed and well-nourished.  HENT:  Head: Normocephalic and atraumatic.  Eyes: EOM are normal.  Neck: Normal range of motion.  Cardiovascular: Normal rate and regular rhythm.  Pulmonary/Chest: Effort normal and breath sounds normal. No respiratory distress. She has no wheezes. She has no rales.  Abdominal: Soft. Bowel sounds are normal. She exhibits no distension. There is no tenderness. There is no rebound.  Musculoskeletal: She exhibits no edema.  Neurological: She is alert and oriented to person, place, and time. Coordination normal.  Skin: Skin is warm and dry.  Psychiatric: She has a normal mood and affect.   Vitals:   10/26/17 1304  BP: 140/82  Pulse: 65  Temp: 97.6 F (36.4 C)  TempSrc: Oral  SpO2: 98%  Weight: 148 lb (67.1 kg)  Height: 5\' 4"  (1.626 m)      Assessment & Plan:  Flu shot given at visit

## 2017-10-28 ENCOUNTER — Other Ambulatory Visit: Payer: 59

## 2017-11-01 ENCOUNTER — Encounter: Payer: Self-pay | Admitting: Internal Medicine

## 2017-11-01 DIAGNOSIS — Z1159 Encounter for screening for other viral diseases: Secondary | ICD-10-CM

## 2017-11-01 DIAGNOSIS — Z0000 Encounter for general adult medical examination without abnormal findings: Secondary | ICD-10-CM

## 2017-11-04 ENCOUNTER — Encounter: Payer: 59 | Admitting: Internal Medicine

## 2017-12-09 ENCOUNTER — Other Ambulatory Visit (INDEPENDENT_AMBULATORY_CARE_PROVIDER_SITE_OTHER): Payer: 59

## 2017-12-09 DIAGNOSIS — Z0000 Encounter for general adult medical examination without abnormal findings: Secondary | ICD-10-CM | POA: Diagnosis not present

## 2017-12-09 DIAGNOSIS — Z1159 Encounter for screening for other viral diseases: Secondary | ICD-10-CM

## 2017-12-09 LAB — LIPID PANEL
CHOLESTEROL: 217 mg/dL — AB (ref 0–200)
HDL: 67.3 mg/dL (ref >39.00)
LDL CALC: 133 mg/dL — AB (ref 0–99)
NonHDL: 149.33
Total CHOL/HDL Ratio: 3
Triglycerides: 80 mg/dL (ref 0.0–149.0)
VLDL: 16 mg/dL (ref 0.0–40.0)

## 2017-12-09 LAB — COMPREHENSIVE METABOLIC PANEL
ALBUMIN: 4.2 g/dL (ref 3.5–5.2)
ALT: 12 U/L (ref 0–35)
AST: 11 U/L (ref 0–37)
Alkaline Phosphatase: 85 U/L (ref 39–117)
BILIRUBIN TOTAL: 0.5 mg/dL (ref 0.2–1.2)
BUN: 14 mg/dL (ref 6–23)
CO2: 29 mEq/L (ref 19–32)
CREATININE: 0.77 mg/dL (ref 0.40–1.20)
Calcium: 8.8 mg/dL (ref 8.4–10.5)
Chloride: 101 mEq/L (ref 96–112)
GFR: 79.99 mL/min (ref >60.00)
Glucose, Bld: 97 mg/dL (ref 70–99)
Potassium: 4 mEq/L (ref 3.5–5.1)
SODIUM: 137 meq/L (ref 135–145)
TOTAL PROTEIN: 7 g/dL (ref 6.0–8.3)

## 2017-12-09 LAB — CBC
HCT: 41.9 % (ref 36.0–46.0)
Hemoglobin: 14 g/dL (ref 12.0–15.0)
MCHC: 33.4 g/dL (ref 30.0–36.0)
MCV: 91.6 fl (ref 78.0–100.0)
PLATELETS: 243 10*3/uL (ref 150.0–400.0)
RBC: 4.57 Mil/uL (ref 3.87–5.11)
RDW: 13.5 % (ref 11.5–15.5)
WBC: 6.1 10*3/uL (ref 4.0–10.5)

## 2017-12-09 LAB — HEMOGLOBIN A1C: HEMOGLOBIN A1C: 5.7 % (ref 4.6–6.5)

## 2017-12-10 LAB — HEPATITIS C ANTIBODY
HEP C AB: NONREACTIVE
SIGNAL TO CUT-OFF: 0.01 (ref <1.00)

## 2017-12-28 DIAGNOSIS — R69 Illness, unspecified: Secondary | ICD-10-CM | POA: Diagnosis not present

## 2018-01-18 DIAGNOSIS — R69 Illness, unspecified: Secondary | ICD-10-CM | POA: Diagnosis not present

## 2018-06-03 DIAGNOSIS — L820 Inflamed seborrheic keratosis: Secondary | ICD-10-CM | POA: Diagnosis not present

## 2018-06-03 DIAGNOSIS — D485 Neoplasm of uncertain behavior of skin: Secondary | ICD-10-CM | POA: Diagnosis not present

## 2018-06-03 DIAGNOSIS — L565 Disseminated superficial actinic porokeratosis (DSAP): Secondary | ICD-10-CM | POA: Diagnosis not present

## 2018-06-03 DIAGNOSIS — C44712 Basal cell carcinoma of skin of right lower limb, including hip: Secondary | ICD-10-CM | POA: Diagnosis not present

## 2018-06-03 DIAGNOSIS — L570 Actinic keratosis: Secondary | ICD-10-CM | POA: Diagnosis not present

## 2018-06-03 DIAGNOSIS — L821 Other seborrheic keratosis: Secondary | ICD-10-CM | POA: Diagnosis not present

## 2018-06-03 DIAGNOSIS — D1801 Hemangioma of skin and subcutaneous tissue: Secondary | ICD-10-CM | POA: Diagnosis not present

## 2018-07-14 DIAGNOSIS — Z85828 Personal history of other malignant neoplasm of skin: Secondary | ICD-10-CM | POA: Diagnosis not present

## 2018-07-14 DIAGNOSIS — L239 Allergic contact dermatitis, unspecified cause: Secondary | ICD-10-CM | POA: Diagnosis not present

## 2018-09-07 DIAGNOSIS — Z825 Family history of asthma and other chronic lower respiratory diseases: Secondary | ICD-10-CM | POA: Diagnosis not present

## 2018-09-07 DIAGNOSIS — Z823 Family history of stroke: Secondary | ICD-10-CM | POA: Diagnosis not present

## 2018-09-07 DIAGNOSIS — Z85828 Personal history of other malignant neoplasm of skin: Secondary | ICD-10-CM | POA: Diagnosis not present

## 2018-09-07 DIAGNOSIS — G3184 Mild cognitive impairment, so stated: Secondary | ICD-10-CM | POA: Diagnosis not present

## 2018-09-07 DIAGNOSIS — Z833 Family history of diabetes mellitus: Secondary | ICD-10-CM | POA: Diagnosis not present

## 2018-09-07 DIAGNOSIS — Z8249 Family history of ischemic heart disease and other diseases of the circulatory system: Secondary | ICD-10-CM | POA: Diagnosis not present

## 2018-09-07 DIAGNOSIS — Z809 Family history of malignant neoplasm, unspecified: Secondary | ICD-10-CM | POA: Diagnosis not present

## 2018-09-07 DIAGNOSIS — R69 Illness, unspecified: Secondary | ICD-10-CM | POA: Diagnosis not present

## 2018-09-07 DIAGNOSIS — R32 Unspecified urinary incontinence: Secondary | ICD-10-CM | POA: Diagnosis not present

## 2018-09-29 DIAGNOSIS — H52201 Unspecified astigmatism, right eye: Secondary | ICD-10-CM | POA: Diagnosis not present

## 2018-09-29 DIAGNOSIS — H524 Presbyopia: Secondary | ICD-10-CM | POA: Diagnosis not present

## 2018-09-29 DIAGNOSIS — H5213 Myopia, bilateral: Secondary | ICD-10-CM | POA: Diagnosis not present

## 2018-09-29 DIAGNOSIS — H2513 Age-related nuclear cataract, bilateral: Secondary | ICD-10-CM | POA: Diagnosis not present

## 2018-10-12 DIAGNOSIS — L245 Irritant contact dermatitis due to other chemical products: Secondary | ICD-10-CM | POA: Diagnosis not present

## 2018-10-12 DIAGNOSIS — Z85828 Personal history of other malignant neoplasm of skin: Secondary | ICD-10-CM | POA: Diagnosis not present

## 2018-10-31 DIAGNOSIS — R69 Illness, unspecified: Secondary | ICD-10-CM | POA: Diagnosis not present

## 2018-11-25 DIAGNOSIS — Z0100 Encounter for examination of eyes and vision without abnormal findings: Secondary | ICD-10-CM | POA: Diagnosis not present

## 2018-12-15 ENCOUNTER — Other Ambulatory Visit: Payer: Self-pay | Admitting: Internal Medicine

## 2018-12-15 ENCOUNTER — Telehealth: Payer: Self-pay | Admitting: Family Medicine

## 2018-12-15 DIAGNOSIS — Z0000 Encounter for general adult medical examination without abnormal findings: Secondary | ICD-10-CM

## 2018-12-15 NOTE — Telephone Encounter (Signed)
LMOVM for pt to call back and schedule TOC appt with Dr. Birdie Riddle.

## 2018-12-15 NOTE — Telephone Encounter (Signed)
Please advise 

## 2018-12-15 NOTE — Telephone Encounter (Signed)
Fine but should change upcoming physical to new provider if able.

## 2018-12-15 NOTE — Telephone Encounter (Signed)
Copied from [place]. Topic: Appointment Scheduling - Transfer of Care >> Dec 15, 2018 10:56 AM Leward Quan A wrote: Pt is requesting to transfer FROM: Pricilla Holm Pt is requesting to transfer TO: Annye Asa Reason for requested transfer: Recommended by Roderic Palau & Amy Clifton James, also daughter Kemiah Booz. Would like the change because all the great things that Dr Birdie Riddle has done in these peoples lives.   Send CRM to patient's current PCP (transferring FROM).

## 2018-12-15 NOTE — Telephone Encounter (Signed)
Please reach out to patient to cancel upcoming CPE with Sharlet Salina and find a time that would work for patient to transfer to [facility].   Thanks!

## 2018-12-15 NOTE — Telephone Encounter (Signed)
Ok to switch since she is a family member

## 2018-12-30 ENCOUNTER — Ambulatory Visit: Payer: 59 | Admitting: Family Medicine

## 2019-01-03 DIAGNOSIS — R69 Illness, unspecified: Secondary | ICD-10-CM | POA: Diagnosis not present

## 2019-01-06 ENCOUNTER — Ambulatory Visit (INDEPENDENT_AMBULATORY_CARE_PROVIDER_SITE_OTHER): Payer: Medicare HMO | Admitting: Family Medicine

## 2019-01-06 ENCOUNTER — Other Ambulatory Visit: Payer: Self-pay

## 2019-01-06 ENCOUNTER — Encounter: Payer: Self-pay | Admitting: Family Medicine

## 2019-01-06 VITALS — BP 142/84 | HR 63 | Temp 98.2°F | Resp 14 | Ht 64.0 in | Wt 143.0 lb

## 2019-01-06 DIAGNOSIS — Z1211 Encounter for screening for malignant neoplasm of colon: Secondary | ICD-10-CM

## 2019-01-06 DIAGNOSIS — Z1239 Encounter for other screening for malignant neoplasm of breast: Secondary | ICD-10-CM | POA: Diagnosis not present

## 2019-01-06 DIAGNOSIS — F419 Anxiety disorder, unspecified: Secondary | ICD-10-CM | POA: Diagnosis not present

## 2019-01-06 DIAGNOSIS — E785 Hyperlipidemia, unspecified: Secondary | ICD-10-CM | POA: Diagnosis not present

## 2019-01-06 DIAGNOSIS — F1010 Alcohol abuse, uncomplicated: Secondary | ICD-10-CM | POA: Insufficient documentation

## 2019-01-06 DIAGNOSIS — Z23 Encounter for immunization: Secondary | ICD-10-CM

## 2019-01-06 DIAGNOSIS — F329 Major depressive disorder, single episode, unspecified: Secondary | ICD-10-CM

## 2019-01-06 DIAGNOSIS — R69 Illness, unspecified: Secondary | ICD-10-CM | POA: Diagnosis not present

## 2019-01-06 LAB — CBC WITH DIFFERENTIAL/PLATELET
BASOS ABS: 0 10*3/uL (ref 0.0–0.1)
BASOS PCT: 0.3 % (ref 0.0–3.0)
EOS ABS: 0.1 10*3/uL (ref 0.0–0.7)
Eosinophils Relative: 1 % (ref 0.0–5.0)
HCT: 42.8 % (ref 36.0–46.0)
Hemoglobin: 14.8 g/dL (ref 12.0–15.0)
Lymphocytes Relative: 22 % (ref 12.0–46.0)
Lymphs Abs: 1.2 10*3/uL (ref 0.7–4.0)
MCHC: 34.6 g/dL (ref 30.0–36.0)
MCV: 91.3 fl (ref 78.0–100.0)
MONO ABS: 0.4 10*3/uL (ref 0.1–1.0)
Monocytes Relative: 7.3 % (ref 3.0–12.0)
NEUTROS ABS: 3.9 10*3/uL (ref 1.4–7.7)
NEUTROS PCT: 69.4 % (ref 43.0–77.0)
PLATELETS: 247 10*3/uL (ref 150.0–400.0)
RBC: 4.69 Mil/uL (ref 3.87–5.11)
RDW: 13.6 % (ref 11.5–15.5)
WBC: 5.7 10*3/uL (ref 4.0–10.5)

## 2019-01-06 LAB — BASIC METABOLIC PANEL
BUN: 16 mg/dL (ref 6–23)
CALCIUM: 8.6 mg/dL (ref 8.4–10.5)
CHLORIDE: 101 meq/L (ref 96–112)
CO2: 25 mEq/L (ref 19–32)
CREATININE: 0.71 mg/dL (ref 0.40–1.20)
GFR: 82.37 mL/min (ref >60.00)
GLUCOSE: 93 mg/dL (ref 70–99)
Potassium: 4.7 mEq/L (ref 3.5–5.1)
Sodium: 136 mEq/L (ref 135–145)

## 2019-01-06 LAB — HEPATIC FUNCTION PANEL
ALT: 13 U/L (ref 0–35)
AST: 22 U/L (ref 0–37)
Albumin: 4.4 g/dL (ref 3.5–5.2)
Alkaline Phosphatase: 90 U/L (ref 39–117)
BILIRUBIN DIRECT: 0.1 mg/dL (ref 0.0–0.3)
BILIRUBIN TOTAL: 0.5 mg/dL (ref 0.2–1.2)
TOTAL PROTEIN: 6.9 g/dL (ref 6.0–8.3)

## 2019-01-06 LAB — LIPID PANEL
Cholesterol: 259 mg/dL — ABNORMAL HIGH (ref 0–200)
HDL: 68.6 mg/dL (ref >39.00)
LDL Cholesterol: 168 mg/dL — ABNORMAL HIGH (ref 0–99)
NONHDL: 190.04
Total CHOL/HDL Ratio: 4
Triglycerides: 110 mg/dL (ref 0.0–149.0)
VLDL: 22 mg/dL (ref 0.0–40.0)

## 2019-01-06 LAB — TSH: TSH: 5.18 u[IU]/mL — ABNORMAL HIGH (ref 0.35–4.50)

## 2019-01-06 MED ORDER — BUPROPION HCL ER (XL) 150 MG PO TB24: 150.0000 mg | ORAL_TABLET | Freq: Every day | ORAL | 3 refills | Status: DC

## 2019-01-06 NOTE — Assessment & Plan Note (Signed)
New to provider, ongoing for pt.  She is not interested in reducing or stopping her alcohol intake at this time.  She feels she has control over it and never puts herself in dangerous situations.  Will continue to follow closely.

## 2019-01-06 NOTE — Assessment & Plan Note (Signed)
Pt has hx of elevated LDL.  Check labs and start meds prn.

## 2019-01-06 NOTE — Patient Instructions (Signed)
Follow up in 3-4 weeks to recheck anxiety We'll notify you of your lab results and make any changes if needed Continue to work on healthy diet and regular exercise- you look great! Start the Wellbutrin once daily We'll call you with mammogram and GI appt Call with any questions or concerns Welcome!! We're glad to have you!!!

## 2019-01-06 NOTE — Assessment & Plan Note (Signed)
Deteriorated.  Pt's anxiety has been quite high and it worsened after stopping her Wellbutrin in 2016.  Would like to restart.  Prescription sent.

## 2019-01-06 NOTE — Progress Notes (Signed)
   Subjective:    Patient ID: Dana Bryan, female    DOB: 08-29-1951, 68 y.o.   MRN: [id number]  HPI Transfer of Care from Dr Sharlet Salina  Health Maintenance-  Pt has not had mammo since 2018.  Overdue for colonoscopy.  UTD on Tdap and flu.  Due for Prevnar.  Anxiety- pt will have palpitations, some SOB.  Particularly when interacting w/ new people.  Also occurs w/ deadlines, holidays.  Previously on Wellbutrin w/ improved anxiety.  Stopped taking meds when insurance no longer covered meds in 2016.  Pt rarely takes the Alprazolam.  Alcohol use- pt is drinking 1-3 vodka cocktails every evening.  First drink around 5-5:30.  Drinks a 2nd w/ dinner.  Occasionally will have a 3rd after dinner.  Pt tries to avoid wine b/c she finds herself drinking an entire bottle.  'it's not something I want to give up'.  She is fearful that this would impact sleep.  Pt does not drink and drive.  Hyperlipidemia- last LDL 133.  Pt has started going to gym since retirement in August.  Typically goes Monday-Friday.    Review of Systems For ROS see HPI     Objective:   Physical Exam Constitutional:      General: She is not in acute distress.    Appearance: She is well-developed.  HENT:     Head: Normocephalic and atraumatic.  Eyes:     Conjunctiva/sclera: Conjunctivae normal.     Pupils: Pupils are equal, round, and reactive to light.  Neck:     Musculoskeletal: Normal range of motion and neck supple.     Thyroid: No thyromegaly.  Cardiovascular:     Rate and Rhythm: Normal rate and regular rhythm.     Heart sounds: Normal heart sounds. No murmur.  Pulmonary:     Effort: Pulmonary effort is normal. No respiratory distress.     Breath sounds: Normal breath sounds.  Abdominal:     General: There is no distension.     Palpations: Abdomen is soft.     Tenderness: There is no abdominal tenderness.  Lymphadenopathy:     Cervical: No cervical adenopathy.  Skin:    General: Skin is warm and dry.    Neurological:     Mental Status: She is alert and oriented to person, place, and time.  Psychiatric:        Behavior: Behavior normal.           Assessment & Plan:

## 2019-01-09 ENCOUNTER — Other Ambulatory Visit: Payer: Medicare HMO

## 2019-01-09 DIAGNOSIS — R7989 Other specified abnormal findings of blood chemistry: Secondary | ICD-10-CM | POA: Diagnosis not present

## 2019-01-09 DIAGNOSIS — R946 Abnormal results of thyroid function studies: Secondary | ICD-10-CM | POA: Diagnosis not present

## 2019-01-10 ENCOUNTER — Other Ambulatory Visit: Payer: Self-pay | Admitting: Family Medicine

## 2019-01-10 ENCOUNTER — Encounter: Payer: Self-pay | Admitting: Gastroenterology

## 2019-01-10 DIAGNOSIS — E785 Hyperlipidemia, unspecified: Secondary | ICD-10-CM

## 2019-01-10 LAB — T4, FREE: Free T4: 1 ng/dL (ref 0.8–1.8)

## 2019-01-10 LAB — T3, FREE: T3 FREE: 2.9 pg/mL (ref 2.3–4.2)

## 2019-01-12 ENCOUNTER — Telehealth: Payer: Self-pay

## 2019-01-12 NOTE — Telephone Encounter (Signed)
Copied from [place] [id number]. Topic: General - Other >> Jan 12, 2019 10:14 AM Carolyn Stare wrote:  Pt call to say she is not sure how much of  red yeast rice supplement that Dr Birdie Riddle recommended she should take

## 2019-01-12 NOTE — Telephone Encounter (Signed)
Called and LMOVM to inform pt of PCP recommendations to follow directions on bottle.

## 2019-01-12 NOTE — Telephone Encounter (Signed)
As directed on the bottle

## 2019-01-17 ENCOUNTER — Encounter: Payer: Self-pay | Admitting: Family Medicine

## 2019-01-17 DIAGNOSIS — R69 Illness, unspecified: Secondary | ICD-10-CM | POA: Diagnosis not present

## 2019-01-30 ENCOUNTER — Other Ambulatory Visit: Payer: Medicare HMO

## 2019-02-02 ENCOUNTER — Other Ambulatory Visit: Payer: Self-pay

## 2019-02-02 ENCOUNTER — Encounter: Payer: Self-pay | Admitting: Family Medicine

## 2019-02-02 ENCOUNTER — Ambulatory Visit (INDEPENDENT_AMBULATORY_CARE_PROVIDER_SITE_OTHER): Payer: Medicare HMO | Admitting: Family Medicine

## 2019-02-02 VITALS — BP 118/80 | HR 64 | Temp 98.2°F | Resp 14 | Ht 64.0 in | Wt 138.0 lb

## 2019-02-02 DIAGNOSIS — R7989 Other specified abnormal findings of blood chemistry: Secondary | ICD-10-CM

## 2019-02-02 DIAGNOSIS — R69 Illness, unspecified: Secondary | ICD-10-CM | POA: Diagnosis not present

## 2019-02-02 DIAGNOSIS — F1010 Alcohol abuse, uncomplicated: Secondary | ICD-10-CM | POA: Diagnosis not present

## 2019-02-02 DIAGNOSIS — F329 Major depressive disorder, single episode, unspecified: Secondary | ICD-10-CM

## 2019-02-02 DIAGNOSIS — F419 Anxiety disorder, unspecified: Secondary | ICD-10-CM

## 2019-02-02 DIAGNOSIS — F32A Depression, unspecified: Secondary | ICD-10-CM

## 2019-02-02 LAB — T4, FREE: FREE T4: 0.85 ng/dL (ref 0.60–1.60)

## 2019-02-02 LAB — T3, FREE: T3, Free: 3.5 pg/mL (ref 2.3–4.2)

## 2019-02-02 LAB — TSH: TSH: 3.87 u[IU]/mL (ref 0.35–4.50)

## 2019-02-02 NOTE — Patient Instructions (Signed)
Schedule your complete physical in 6 months We'll notify you of your lab results and make any changes if needed Keep up the good work on healthy diet and regular exercise- you look great! I'm proud of you for limiting the alcohol! Call with any questions or concerns Happy Valentine's Day!!!

## 2019-02-02 NOTE — Progress Notes (Signed)
   Subjective:    Patient ID: Dana Bryan, female    DOB: Jan 29, 1951, 68 y.o.   MRN: [id number]  HPI Anxiety/depression- pt was restarted on Wellbutrin at last visit.  Less anxious, less depressed.  Mood has improved.  Energy level is good.  Exercising regularly.  Pt has decreased alcohol consumption to 1 drink daily.  Denies palpitations, insomnia.  Abnormal TSH- TSH was mildly elevated but free T3/T4 were WNL.  Pt denies excessive fatigue, changes to skin/hair/nails   Review of Systems For ROS see HPI     Objective:   Physical Exam Vitals signs reviewed.  Constitutional:      General: She is not in acute distress.    Appearance: Normal appearance. She is not ill-appearing.  HENT:     Head: Normocephalic and atraumatic.  Skin:    General: Skin is warm and dry.  Neurological:     General: No focal deficit present.     Mental Status: She is alert and oriented to person, place, and time.  Psychiatric:        Mood and Affect: Mood normal.        Behavior: Behavior normal.        Thought Content: Thought content normal.           Assessment & Plan:  Abnormal TSH- noted on last labs.  Free T3/T4 were normal.  Pt is asymptomatic.  Will repeat today.

## 2019-02-02 NOTE — Assessment & Plan Note (Addendum)
Pt has decreased consumption to 1 drink daily.  Applauded her efforts.  Will continue to follow.

## 2019-02-02 NOTE — Assessment & Plan Note (Signed)
Much improved since starting Wellbutrin.  No med changes at this time.  Will continue to follow.

## 2019-02-03 ENCOUNTER — Encounter: Payer: Self-pay | Admitting: General Practice

## 2019-02-10 ENCOUNTER — Ambulatory Visit: Payer: Medicare HMO | Admitting: Gastroenterology

## 2019-03-01 ENCOUNTER — Encounter: Payer: Self-pay | Admitting: Family Medicine

## 2019-03-01 MED ORDER — BUPROPION HCL ER (XL) 150 MG PO TB24: 150.0000 mg | ORAL_TABLET | Freq: Every day | ORAL | 1 refills | Status: DC

## 2019-03-14 ENCOUNTER — Other Ambulatory Visit: Payer: Self-pay

## 2019-03-14 ENCOUNTER — Telehealth (INDEPENDENT_AMBULATORY_CARE_PROVIDER_SITE_OTHER): Payer: Medicare HMO | Admitting: Gastroenterology

## 2019-03-14 DIAGNOSIS — Z1211 Encounter for screening for malignant neoplasm of colon: Secondary | ICD-10-CM

## 2019-03-14 NOTE — Progress Notes (Signed)
Virtual Visit via Video Note  I connected with Dana Bryan on 03/14/19 at  2:30 PM EDT by a telemedicine application and verified that I am speaking with the correct person using two identifiers.   I discussed the limitations of evaluation and management by telemedicine and the availability of in person appointments. The patient expressed understanding and agreed to proceed.  THIS ENCOUNTER IS A VIRTUAL VISIT DUE TO COVID-19 - PATIENT WAS NOT SEEN IN THE OFFICE. PATIENT HAS CONSENTED TO VIRTUAL VISIT / TELEMEDICINE VISIT   Location of patient: home Location of provider: office Name of referring provider: Spero Curb Time spent on call: 12 minutes  HPI :  68 y/o female with a history HLD, depression, referred for a screening colonoscopy by Dr. Annye Asa.  Last colonoscopy was in 03/2007, and was normal. She denies any problems with her bowel habits. No blood in the stools. No abdominal pains that are routinely bothersome. Occasional bloating which is longstanding. No colon cancer in the family. No problems with anesthesia.   Otherwise feeling well. She has a nagging cough. Follows with Dr. Birdie Riddle. No fever.   Colonoscopy 04/06/2007 - normal exam, no polyps  Past Medical History:  Diagnosis Date  . Hyperlipidemia   . Reactive depression (situational)   . Vasomotor rhinitis      Past Surgical History:  Procedure Laterality Date  . TONSILLECTOMY     Family History  Problem Relation Age of Onset  . Alcohol abuse Father   . Hypertension Mother    Social History   Tobacco Use  . Smoking status: Former Smoker    Types: Cigarettes    Last attempt to quit: 01/22/2004    Years since quitting: 15.1  . Smokeless tobacco: Never Used  Substance Use Topics  . Alcohol use: Not on file  . Drug use: Not on file   Current Outpatient Medications  Medication Sig Dispense Refill  . ALPRAZolam (XANAX) 0.25 MG tablet Take 1 tablet (0.25 mg total) by mouth daily as needed.  30 tablet 0  . buPROPion (WELLBUTRIN XL) 150 MG 24 hr tablet Take 1 tablet (150 mg total) by mouth daily. 90 tablet 1  . loratadine (CLARITIN) 10 MG tablet Take 10 mg by mouth daily.    . Red Yeast Rice Extract (RED YEAST RICE PO) Take 1,200 mg by mouth daily.     No current facility-administered medications for this visit.    No Known Allergies   Review of Systems: All systems reviewed and negative except where noted in HPI.   Lab Results  Component Value Date   WBC 5.7 01/06/2019   HGB 14.8 01/06/2019   HCT 42.8 01/06/2019   MCV 91.3 01/06/2019   PLT 247.0 01/06/2019    Lab Results  Component Value Date   CREATININE 0.71 01/06/2019   BUN 16 01/06/2019   NA 136 01/06/2019   K 4.7 01/06/2019   CL 101 01/06/2019   CO2 25 01/06/2019    Lab Results  Component Value Date   ALT 13 01/06/2019   AST 22 01/06/2019   ALKPHOS 90 01/06/2019   BILITOT 0.5 01/06/2019      Physical Exam: There were no vitals taken for this visit.  No exam done   ASSESSMENT AND PLAN:  68 y/o female referred her for the following:  Colon cancer screening - the patient is average risk for screening, but is overdue for screening exam. I have discussed colonoscopy versus stool based testing for screening, including  risks / benefits of each. I recommend optical colonoscopy if she able to tolerate it. Following our discussion she wanted to proceed. Due to the COVID-19 outbreak, we are not doing any routine screening exams right now, we discussed the timing of this, which may not be for another several weeks. She will be contacted for scheduling once we are cleared to resume practicing. She agreed. All questions answered.  Collingsworth Cellar, MD [facility]  CC: Midge Minium, MD

## 2019-03-21 ENCOUNTER — Encounter: Payer: Self-pay | Admitting: Family Medicine

## 2019-03-21 ENCOUNTER — Emergency Department (HOSPITAL_COMMUNITY): Payer: Medicare HMO

## 2019-03-21 ENCOUNTER — Observation Stay (HOSPITAL_COMMUNITY)
Admission: EM | Admit: 2019-03-21 | Discharge: 2019-03-22 | Disposition: A | Discharge status: Home / Self Care | Payer: Medicare HMO | Attending: Internal Medicine | Admitting: Internal Medicine

## 2019-03-21 ENCOUNTER — Encounter (HOSPITAL_COMMUNITY): Payer: Self-pay

## 2019-03-21 ENCOUNTER — Other Ambulatory Visit: Payer: Self-pay

## 2019-03-21 ENCOUNTER — Observation Stay (HOSPITAL_COMMUNITY): Payer: Medicare HMO

## 2019-03-21 DIAGNOSIS — I10 Essential (primary) hypertension: Secondary | ICD-10-CM | POA: Insufficient documentation

## 2019-03-21 DIAGNOSIS — I69311 Memory deficit following cerebral infarction: Secondary | ICD-10-CM | POA: Insufficient documentation

## 2019-03-21 DIAGNOSIS — H53129 Transient visual loss, unspecified eye: Secondary | ICD-10-CM | POA: Diagnosis not present

## 2019-03-21 DIAGNOSIS — R05 Cough: Secondary | ICD-10-CM | POA: Diagnosis not present

## 2019-03-21 DIAGNOSIS — E785 Hyperlipidemia, unspecified: Secondary | ICD-10-CM | POA: Diagnosis present

## 2019-03-21 DIAGNOSIS — Z87891 Personal history of nicotine dependence: Secondary | ICD-10-CM | POA: Diagnosis not present

## 2019-03-21 DIAGNOSIS — R413 Other amnesia: Secondary | ICD-10-CM | POA: Diagnosis not present

## 2019-03-21 DIAGNOSIS — F419 Anxiety disorder, unspecified: Secondary | ICD-10-CM | POA: Diagnosis present

## 2019-03-21 DIAGNOSIS — G43809 Other migraine, not intractable, without status migrainosus: Secondary | ICD-10-CM | POA: Insufficient documentation

## 2019-03-21 DIAGNOSIS — Z79899 Other long term (current) drug therapy: Secondary | ICD-10-CM | POA: Diagnosis not present

## 2019-03-21 DIAGNOSIS — R69 Illness, unspecified: Secondary | ICD-10-CM | POA: Diagnosis not present

## 2019-03-21 DIAGNOSIS — G459 Transient cerebral ischemic attack, unspecified: Principal | ICD-10-CM | POA: Insufficient documentation

## 2019-03-21 DIAGNOSIS — F329 Major depressive disorder, single episode, unspecified: Secondary | ICD-10-CM | POA: Insufficient documentation

## 2019-03-21 DIAGNOSIS — H539 Unspecified visual disturbance: Secondary | ICD-10-CM | POA: Diagnosis not present

## 2019-03-21 DIAGNOSIS — R1310 Dysphagia, unspecified: Secondary | ICD-10-CM | POA: Insufficient documentation

## 2019-03-21 DIAGNOSIS — H538 Other visual disturbances: Secondary | ICD-10-CM | POA: Diagnosis present

## 2019-03-21 DIAGNOSIS — F1010 Alcohol abuse, uncomplicated: Secondary | ICD-10-CM | POA: Diagnosis present

## 2019-03-21 DIAGNOSIS — G454 Transient global amnesia: Secondary | ICD-10-CM | POA: Diagnosis not present

## 2019-03-21 LAB — COMPREHENSIVE METABOLIC PANEL
ALT: 17 U/L (ref 0–44)
ANION GAP: 13 (ref 5–15)
AST: 16 U/L (ref 15–41)
Albumin: 4.8 g/dL (ref 3.5–5.0)
Alkaline Phosphatase: 140 U/L — ABNORMAL HIGH (ref 38–126)
BUN: 12 mg/dL (ref 8–23)
CO2: 24 mmol/L (ref 22–32)
Calcium: 9.4 mg/dL (ref 8.9–10.3)
Chloride: 99 mmol/L (ref 98–111)
Creatinine, Ser: 0.67 mg/dL (ref 0.44–1.00)
GFR calc Af Amer: >60 mL/min (ref >60)
GFR calc non Af Amer: >60 mL/min (ref >60)
Glucose, Bld: 100 mg/dL — ABNORMAL HIGH (ref 70–99)
POTASSIUM: 3.9 mmol/L (ref 3.5–5.1)
Sodium: 136 mmol/L (ref 135–145)
Total Bilirubin: 0.7 mg/dL (ref 0.3–1.2)
Total Protein: 8.7 g/dL — ABNORMAL HIGH (ref 6.5–8.1)

## 2019-03-21 LAB — I-STAT CREATININE, ED: Creatinine, Ser: 0.6 mg/dL (ref 0.44–1.00)

## 2019-03-21 LAB — URINALYSIS, ROUTINE W REFLEX MICROSCOPIC
Bilirubin Urine: NEGATIVE
Glucose, UA: NEGATIVE mg/dL
Ketones, ur: NEGATIVE mg/dL
Leukocytes,Ua: NEGATIVE
Nitrite: NEGATIVE
Protein, ur: NEGATIVE mg/dL
Specific Gravity, Urine: 1.003 — ABNORMAL LOW (ref 1.005–1.030)
pH: 6 (ref 5.0–8.0)

## 2019-03-21 LAB — RAPID URINE DRUG SCREEN, HOSP PERFORMED
AMPHETAMINES: NOT DETECTED
BENZODIAZEPINES: NOT DETECTED
Barbiturates: NOT DETECTED
Cocaine: NOT DETECTED
Opiates: NOT DETECTED
Tetrahydrocannabinol: NOT DETECTED

## 2019-03-21 LAB — CBC WITH DIFFERENTIAL/PLATELET
Abs Immature Granulocytes: 0 10*3/uL (ref 0.00–0.07)
Basophils Absolute: 0 10*3/uL (ref 0.0–0.1)
Basophils Relative: 0 %
Eosinophils Absolute: 0 10*3/uL (ref 0.0–0.5)
Eosinophils Relative: 0 %
HCT: 44.1 % (ref 36.0–46.0)
Hemoglobin: 14.6 g/dL (ref 12.0–15.0)
Lymphocytes Relative: 26 %
Lymphs Abs: 1.9 10*3/uL (ref 0.7–4.0)
MCH: 30.4 pg (ref 26.0–34.0)
MCHC: 33.1 g/dL (ref 30.0–36.0)
MCV: 91.9 fL (ref 80.0–100.0)
Monocytes Absolute: 0.2 10*3/uL (ref 0.1–1.0)
Monocytes Relative: 3 %
Neutro Abs: 5.1 10*3/uL (ref 1.7–7.7)
Neutrophils Relative %: 71 %
Platelets: 301 10*3/uL (ref 150–400)
RBC: 4.8 MIL/uL (ref 3.87–5.11)
RDW: 13.1 % (ref 11.5–15.5)
WBC: 7.2 10*3/uL (ref 4.0–10.5)
nRBC: 0 % (ref 0.0–0.2)

## 2019-03-21 LAB — CBG MONITORING, ED: Glucose-Capillary: 85 mg/dL (ref 70–99)

## 2019-03-21 LAB — PROTIME-INR
INR: 1 (ref 0.8–1.2)
PROTHROMBIN TIME: 12.9 s (ref 11.4–15.2)

## 2019-03-21 LAB — APTT: aPTT: 28 seconds (ref 24–36)

## 2019-03-21 LAB — ETHANOL: Alcohol, Ethyl (B): <10 mg/dL (ref <10)

## 2019-03-21 MED ORDER — SODIUM CHLORIDE (PF) 0.9 % IJ SOLN
INTRAMUSCULAR | Status: AC
  Filled 2019-03-21: qty 50

## 2019-03-21 MED ORDER — RED YEAST RICE 600 MG PO CAPS: 1200.0000 mg | ORAL_CAPSULE | Freq: Every day | ORAL | Status: DC

## 2019-03-21 MED ORDER — IBUPROFEN 200 MG PO TABS: 400.0000 mg | ORAL_TABLET | Freq: Every day | ORAL | Status: DC | PRN

## 2019-03-21 MED ORDER — ASPIRIN 325 MG PO TABS
325.0000 mg | ORAL_TABLET | Freq: Every day | ORAL | Status: DC
  Administered 2019-03-22: 325 mg via ORAL
  Filled 2019-03-21: qty 1

## 2019-03-21 MED ORDER — ALPRAZOLAM 0.25 MG PO TABS: 0.1250 mg | ORAL_TABLET | Freq: Every day | ORAL | Status: DC | PRN

## 2019-03-21 MED ORDER — LORAZEPAM 2 MG/ML IJ SOLN
0.5000 mg | Freq: Once | INTRAMUSCULAR | Status: AC
  Administered 2019-03-21: 0.5 mg via INTRAVENOUS
  Filled 2019-03-21: qty 1

## 2019-03-21 MED ORDER — BUPROPION HCL ER (XL) 150 MG PO TB24
150.0000 mg | ORAL_TABLET | Freq: Every day | ORAL | Status: DC
  Administered 2019-03-22: 150 mg via ORAL
  Filled 2019-03-21: qty 1

## 2019-03-21 MED ORDER — SODIUM CHLORIDE 0.9 % IV SOLN
INTRAVENOUS | Status: DC
  Administered 2019-03-22 (×2): via INTRAVENOUS

## 2019-03-21 MED ORDER — STROKE: EARLY STAGES OF RECOVERY BOOK
Freq: Once | Status: AC
  Administered 2019-03-22

## 2019-03-21 MED ORDER — ACETAMINOPHEN 325 MG PO TABS: 650.0000 mg | ORAL_TABLET | ORAL | Status: DC | PRN

## 2019-03-21 MED ORDER — ACETAMINOPHEN 160 MG/5ML PO SOLN: 650.0000 mg | ORAL | Status: DC | PRN

## 2019-03-21 MED ORDER — SENNOSIDES-DOCUSATE SODIUM 8.6-50 MG PO TABS: 1.0000 | ORAL_TABLET | Freq: Every evening | ORAL | Status: DC | PRN

## 2019-03-21 MED ORDER — ACETAMINOPHEN 650 MG RE SUPP: 650.0000 mg | RECTAL | Status: DC | PRN

## 2019-03-21 MED ORDER — ATORVASTATIN CALCIUM 40 MG PO TABS: 40.0000 mg | ORAL_TABLET | Freq: Every day | ORAL | Status: DC

## 2019-03-21 MED ORDER — CLOPIDOGREL BISULFATE 75 MG PO TABS
75.0000 mg | ORAL_TABLET | Freq: Every day | ORAL | Status: DC
  Administered 2019-03-22: 75 mg via ORAL
  Filled 2019-03-21: qty 1

## 2019-03-21 MED ORDER — ENOXAPARIN SODIUM 40 MG/0.4ML ~~LOC~~ SOLN
40.0000 mg | Freq: Every day | SUBCUTANEOUS | Status: DC
  Administered 2019-03-22: 40 mg via SUBCUTANEOUS
  Filled 2019-03-21: qty 0.4

## 2019-03-21 MED ORDER — LORATADINE 10 MG PO TABS
10.0000 mg | ORAL_TABLET | Freq: Every day | ORAL | Status: DC
  Filled 2019-03-21: qty 1

## 2019-03-21 MED ORDER — IOHEXOL 350 MG/ML SOLN
100.0000 mL | Freq: Once | INTRAVENOUS | Status: AC | PRN
  Administered 2019-03-21: 80 mL via INTRAVENOUS

## 2019-03-21 NOTE — ED Provider Notes (Signed)
[facility] DEPT Provider Note   CSN: [id number] Arrival date & time: 03/21/19  1831    History   Chief Complaint Chief Complaint  Patient presents with   Eye Problem   Memory Loss   Cough    HPI Dana Bryan is a 68 y.o. female.     The history is provided by the patient and medical records. No language interpreter was used.  Neurologic Problem  This is a new problem. The current episode started 3 to 5 hours ago. The problem occurs rarely. The problem has been resolved. Pertinent negatives include no chest pain, no abdominal pain, no headaches and no shortness of breath. Nothing aggravates the symptoms. Nothing relieves the symptoms. She has tried nothing for the symptoms. The treatment provided no relief.    Past Medical History:  Diagnosis Date   Hyperlipidemia    Reactive depression (situational)    Vasomotor rhinitis     Patient Active Problem List   Diagnosis Date Noted   Alcohol abuse, daily use 01/06/2019   Routine health maintenance 08/06/2011   VITAMIN B12 DEFICIENCY 07/18/2008   Hyperlipidemia 02/15/2008   Anxiety and depression 02/15/2008    Past Surgical History:  Procedure Laterality Date   TONSILLECTOMY       OB History   No obstetric history on file.      Home Medications    Prior to Admission medications   Medication Sig Start Date End Date Taking? Authorizing Provider  ALPRAZolam (XANAX) 0.25 MG tablet Take 1 tablet (0.25 mg total) by mouth daily as needed. Patient taking differently: Take 0.125-0.25 mg by mouth daily as needed for anxiety.  11/03/16  Yes Hoyt Koch, MD  buPROPion (WELLBUTRIN XL) 150 MG 24 hr tablet Take 1 tablet (150 mg total) by mouth daily. 03/01/19  Yes Midge Minium, MD  ibuprofen (ADVIL,MOTRIN) 200 MG tablet Take 400 mg by mouth daily as needed for headache.   Yes [provider]  loratadine (CLARITIN) 10 MG tablet Take 10 mg by mouth daily.    Yes [provider]  Red Yeast Rice Extract (RED YEAST RICE PO) Take 1,200 mg by mouth daily.   Yes [provider]    Family History Family History  Problem Relation Age of Onset   Alcohol abuse Father    Hypertension Mother     Social History Social History   Tobacco Use   Smoking status: Former Smoker    Types: Cigarettes    Last attempt to quit: 01/22/2004    Years since quitting: 15.1   Smokeless tobacco: Never Used  Substance Use Topics   Alcohol use: Not on file   Drug use: Not on file     Allergies   Patient has no known allergies.   Review of Systems Review of Systems  Constitutional: Negative for chills, diaphoresis, fatigue and fever.  HENT: Negative for congestion.   Eyes: Positive for visual disturbance. Negative for photophobia.  Respiratory: Negative for cough, chest tightness, shortness of breath and wheezing.   Cardiovascular: Negative for chest pain, palpitations and leg swelling.  Gastrointestinal: Negative for abdominal pain, diarrhea, nausea and vomiting.  Genitourinary: Negative for flank pain and frequency.  Musculoskeletal: Negative for back pain, neck pain and neck stiffness.  Skin: Negative for rash and wound.  Neurological: Negative for dizziness, tremors, seizures, syncope, speech difficulty, weakness, light-headedness, numbness and headaches.  Psychiatric/Behavioral: Negative for agitation.       Memory loss   All other  systems reviewed and are negative.    Physical Exam Updated Vital Signs BP (!) 175/92 (BP Location: Right Arm)    Pulse 91    Temp 97.9 F (36.6 C) (Oral)    Resp 15    Ht 5' 3.5" (1.613 m)    Wt 61.2 kg    SpO2 99%    BMI 23.54 kg/m   Physical Exam Vitals signs and nursing note reviewed.  Constitutional:      General: She is not in acute distress.    Appearance: She is well-developed. She is not ill-appearing, toxic-appearing or diaphoretic.  HENT:     Head: Normocephalic and atraumatic.       Right Ear: External ear normal.     Left Ear: External ear normal.     Nose: Nose normal. No congestion or rhinorrhea.     Mouth/Throat:     Mouth: Mucous membranes are moist.     Pharynx: No oropharyngeal exudate or posterior oropharyngeal erythema.  Eyes:     Conjunctiva/sclera: Conjunctivae normal.     Pupils: Pupils are equal, round, and reactive to light.  Neck:     Musculoskeletal: Normal range of motion and neck supple. No muscular tenderness.  Cardiovascular:     Rate and Rhythm: Normal rate.     Heart sounds: No murmur.  Pulmonary:     Effort: No respiratory distress.     Breath sounds: No stridor. No wheezing, rhonchi or rales.  Chest:     Chest wall: No tenderness.  Abdominal:     General: Abdomen is flat. There is no distension.     Tenderness: There is no abdominal tenderness. There is no right CVA tenderness, left CVA tenderness or rebound.  Skin:    General: Skin is warm.     Capillary Refill: Capillary refill takes less than 2 seconds.     Findings: No erythema or rash.  Neurological:     General: No focal deficit present.     Mental Status: She is alert and oriented to person, place, and time.     Cranial Nerves: No cranial nerve deficit.     Sensory: No sensory deficit.     Motor: No weakness or abnormal muscle tone.     Coordination: Coordination normal.     Deep Tendon Reflexes: Reflexes are normal and symmetric. Reflexes normal.  Psychiatric:        Mood and Affect: Mood normal.      ED Treatments / Results  Labs (all labs ordered are listed, but only abnormal results are displayed) Labs Reviewed  COMPREHENSIVE METABOLIC PANEL - Abnormal; Notable for the following components:      Result Value   Glucose, Bld 100 (*)    Total Protein 8.7 (*)    Alkaline Phosphatase 140 (*)    All other components within normal limits  URINALYSIS, ROUTINE W REFLEX MICROSCOPIC - Abnormal; Notable for the following components:   Color, Urine COLORLESS (*)     Specific Gravity, Urine 1.003 (*)    Hgb urine dipstick SMALL (*)    Bacteria, UA RARE (*)    All other components within normal limits  ETHANOL  PROTIME-INR  APTT  RAPID URINE DRUG SCREEN, HOSP PERFORMED  CBC WITH DIFFERENTIAL/PLATELET  CBC  DIFFERENTIAL  HIV ANTIBODY (ROUTINE TESTING W REFLEX)  HEMOGLOBIN A1C  LIPID PANEL  CBC  CREATININE, SERUM  CBG MONITORING, ED  I-STAT CREATININE, ED    EKG EKG Interpretation  Date/Time:  Tuesday March 21 2019  20:29:40 EDT Ventricular Rate:  76 PR Interval:    QRS Duration: 89 QT Interval:  380 QTC Calculation: 428 R Axis:   85 Text Interpretation:  Sinus rhythm Borderline right axis deviation NO prior ECG for comparison.  No STEMI Confirmed by Antony Blackbird [phone]) on 03/22/2019 12:22:50 AM   Radiology Ct Angio Head W Or Wo Contrast  Result Date: 03/21/2019 CLINICAL DATA:  Transient vision and memory loss.  Assess TIA. EXAM: CT ANGIOGRAPHY HEAD AND NECK TECHNIQUE: Multidetector CT imaging of the head and neck was performed using the standard protocol during bolus administration of intravenous contrast. Multiplanar CT image reconstructions and MIPs were obtained to evaluate the vascular anatomy. Carotid stenosis measurements (when applicable) are obtained utilizing NASCET criteria, using the distal internal carotid diameter as the denominator. CONTRAST:  38mL OMNIPAQUE IOHEXOL 350 MG/ML SOLN COMPARISON:  None. FINDINGS: CT HEAD FINDINGS BRAIN: No intraparenchymal hemorrhage, mass effect nor midline shift. No parenchymal brain volume loss for age. No hydrocephalus. No acute large vascular territory infarcts. No abnormal extra-axial fluid collections. Basal cisterns are patent. VASCULAR: Mild calcific atherosclerosis of the carotid siphons. SKULL: No skull fracture. Subcentimeter parietal calvarial probable hemangioma. No significant scalp soft tissue swelling. SINUSES/ORBITS: Paranasal sinuses are well aerated. Mastoid air cells are well  aerated.The included ocular globes and orbital contents are non-suspicious. OTHER: None. CTA NECK FINDINGS: AORTIC ARCH: Normal appearance of the thoracic arch, 2 vessel arch is a normal variant. Mild calcific atherosclerosis aortic arch. The origins of the innominate, left Common carotid artery and subclavian artery are patent. RIGHT CAROTID SYSTEM: Common carotid artery is patent. Normal appearance of the carotid bifurcation without hemodynamically significant stenosis by NASCET criteria. Normal appearance of the internal carotid artery. LEFT CAROTID SYSTEM: Common carotid artery is patent. Mild calcific atherosclerosis of the carotid bifurcation without hemodynamically significant stenosis by NASCET criteria. Normal appearance of the internal carotid artery. VERTEBRAL ARTERIES:RIGHT vertebral artery is dominant. Patent bilateral vertebral arteries, beam hardening artifact through LEFT vertebral artery without flow-limiting stenosis. SKELETON: No acute osseous process though bone windows have not been submitted. OTHER NECK: Soft tissues of the neck are nonacute though, not tailored for evaluation. Cervical spondylosis resulting in severe C5-6 neural foraminal narrowing. UPPER CHEST: Biapical pleuroparenchymal scarring clear. No superior mediastinal lymphadenopathy. CTA HEAD FINDINGS: ANTERIOR CIRCULATION: Patent cervical internal carotid arteries, petrous, cavernous and supra clinoid internal carotid arteries. Patent anterior communicating artery. Patent anterior and middle cerebral arteries. No large vessel occlusion, flow-limiting stenosis, contrast extravasation or aneurysm. POSTERIOR CIRCULATION: Patent vertebral arteries, vertebrobasilar junction and basilar artery, as well as main branch vessels. Patent posterior cerebral arteries. Robust bilateral posterior communicating arteries present. No large vessel occlusion, flow-limiting stenosis, contrast extravasation or aneurysm. VENOUS SINUSES: Major dural  venous sinuses are patent though not tailored for evaluation on this angiographic examination. ANATOMIC VARIANTS: None. DELAYED PHASE: Not performed. MIP images reviewed. IMPRESSION: 1. Negative CT HEAD with and without contrast for age. 2. Negative CTA HEAD and CTA NECK. Aortic Atherosclerosis (ICD10-I70.0). Electronically Signed   By: Elon Alas M.D.   On: 03/21/2019 21:49   Ct Angio Neck W Or Wo Contrast  Result Date: 03/21/2019 CLINICAL DATA:  Transient vision and memory loss.  Assess TIA. EXAM: CT ANGIOGRAPHY HEAD AND NECK TECHNIQUE: Multidetector CT imaging of the head and neck was performed using the standard protocol during bolus administration of intravenous contrast. Multiplanar CT image reconstructions and MIPs were obtained to evaluate the vascular anatomy. Carotid stenosis measurements (when applicable) are obtained utilizing NASCET criteria,  using the distal internal carotid diameter as the denominator. CONTRAST:  20mL OMNIPAQUE IOHEXOL 350 MG/ML SOLN COMPARISON:  None. FINDINGS: CT HEAD FINDINGS BRAIN: No intraparenchymal hemorrhage, mass effect nor midline shift. No parenchymal brain volume loss for age. No hydrocephalus. No acute large vascular territory infarcts. No abnormal extra-axial fluid collections. Basal cisterns are patent. VASCULAR: Mild calcific atherosclerosis of the carotid siphons. SKULL: No skull fracture. Subcentimeter parietal calvarial probable hemangioma. No significant scalp soft tissue swelling. SINUSES/ORBITS: Paranasal sinuses are well aerated. Mastoid air cells are well aerated.The included ocular globes and orbital contents are non-suspicious. OTHER: None. CTA NECK FINDINGS: AORTIC ARCH: Normal appearance of the thoracic arch, 2 vessel arch is a normal variant. Mild calcific atherosclerosis aortic arch. The origins of the innominate, left Common carotid artery and subclavian artery are patent. RIGHT CAROTID SYSTEM: Common carotid artery is patent. Normal  appearance of the carotid bifurcation without hemodynamically significant stenosis by NASCET criteria. Normal appearance of the internal carotid artery. LEFT CAROTID SYSTEM: Common carotid artery is patent. Mild calcific atherosclerosis of the carotid bifurcation without hemodynamically significant stenosis by NASCET criteria. Normal appearance of the internal carotid artery. VERTEBRAL ARTERIES:RIGHT vertebral artery is dominant. Patent bilateral vertebral arteries, beam hardening artifact through LEFT vertebral artery without flow-limiting stenosis. SKELETON: No acute osseous process though bone windows have not been submitted. OTHER NECK: Soft tissues of the neck are nonacute though, not tailored for evaluation. Cervical spondylosis resulting in severe C5-6 neural foraminal narrowing. UPPER CHEST: Biapical pleuroparenchymal scarring clear. No superior mediastinal lymphadenopathy. CTA HEAD FINDINGS: ANTERIOR CIRCULATION: Patent cervical internal carotid arteries, petrous, cavernous and supra clinoid internal carotid arteries. Patent anterior communicating artery. Patent anterior and middle cerebral arteries. No large vessel occlusion, flow-limiting stenosis, contrast extravasation or aneurysm. POSTERIOR CIRCULATION: Patent vertebral arteries, vertebrobasilar junction and basilar artery, as well as main branch vessels. Patent posterior cerebral arteries. Robust bilateral posterior communicating arteries present. No large vessel occlusion, flow-limiting stenosis, contrast extravasation or aneurysm. VENOUS SINUSES: Major dural venous sinuses are patent though not tailored for evaluation on this angiographic examination. ANATOMIC VARIANTS: None. DELAYED PHASE: Not performed. MIP images reviewed. IMPRESSION: 1. Negative CT HEAD with and without contrast for age. 2. Negative CTA HEAD and CTA NECK. Aortic Atherosclerosis (ICD10-I70.0). Electronically Signed   By: Elon Alas M.D.   On: 03/21/2019 21:49   Mr Brain  Wo Contrast  Result Date: 03/21/2019 CLINICAL DATA:  Transient vision and memory loss today. History of hypertension, hyperlipidemia. EXAM: MRI HEAD WITHOUT CONTRAST TECHNIQUE: Multiplanar, multiecho pulse sequences of the brain and surrounding structures were obtained without intravenous contrast. COMPARISON:  CT HEAD March 21, 2019 FINDINGS: Mild motion degraded examination. INTRACRANIAL CONTENTS: No reduced diffusion to suggest acute ischemia. No susceptibility artifact to suggest hemorrhage. No parenchymal brain volume loss for age. No hydrocephalus. A few scattered subcentimeter supratentorial white matter FLAIR T2 hyperintensities seen with chronic small vessel ischemic changes, less than expected for age. No suspicious parenchymal signal, masses, mass effect. No abnormal extra-axial fluid collections. No extra-axial masses. VASCULAR: Normal major intracranial vascular flow voids present at skull base. SKULL AND UPPER CERVICAL SPINE: No abnormal sellar expansion. No suspicious calvarial bone marrow signal. Craniocervical junction maintained. SINUSES/ORBITS: The mastoid air-cells and included paranasal sinuses are well-aerated.The included ocular globes and orbital contents are non-suspicious. OTHER: None. IMPRESSION: 1. Negative motion degraded noncontrast MRI head for age. Electronically Signed   By: Elon Alas M.D.   On: 03/21/2019 22:33    Procedures Procedures (including critical care time)  Medications  Ordered in ED Medications  sodium chloride (PF) 0.9 % injection (has no administration in time range)  ALPRAZolam (XANAX) tablet 0.125-0.25 mg (has no administration in time range)  loratadine (CLARITIN) tablet 10 mg (has no administration in time range)  buPROPion (WELLBUTRIN XL) 24 hr tablet 150 mg (has no administration in time range)  ibuprofen (ADVIL,MOTRIN) tablet 400 mg (has no administration in time range)   stroke: mapping our early stages of recovery book (has no  administration in time range)  0.9 %  sodium chloride infusion (has no administration in time range)  acetaminophen (TYLENOL) tablet 650 mg (has no administration in time range)    Or  acetaminophen (TYLENOL) solution 650 mg (has no administration in time range)    Or  acetaminophen (TYLENOL) suppository 650 mg (has no administration in time range)  senna-docusate (Senokot-S) tablet 1 tablet (has no administration in time range)  enoxaparin (LOVENOX) injection 40 mg (has no administration in time range)  aspirin tablet 325 mg (has no administration in time range)  atorvastatin (LIPITOR) tablet 40 mg (has no administration in time range)  clopidogrel (PLAVIX) tablet 75 mg (has no administration in time range)  LORazepam (ATIVAN) injection 0.5 mg (0.5 mg Intravenous Given 03/21/19 2103)  iohexol (OMNIPAQUE) 350 MG/ML injection 100 mL (80 mLs Intravenous Contrast Given 03/21/19 2110)     Initial Impression / Assessment and Plan / ED Course  I have reviewed the triage vital signs and the nursing notes.  Pertinent labs & imaging results that were available during my care of the patient were reviewed by me and considered in my medical decision making (see chart for details).        Dana Bryan is a 68 y.o. female with a past medical history significant for hyperlipidemia, anxiety, depression, and chronic URI for the last 2 months who presents at the direction of her PCP for transient vision changes and memory loss.  Patient reports that at around noon today, she had about 10 to 15-minute episode of loss of her right vision.  She reports that she was watching the news and she noticed the right side of everyone's face and the right half the television was completely dark.  It was in both of her eyes.  She reports it was transient and completely resolved.  She then had lunch and spoke with a friend on the phone when she completely forgot who her granddaughter was.  This also lasted for several  minutes before resolving.  Patient called her PCP was concerned patient may have had a TIA and prompted her to seek evaluation.  Of note, patient has had URI symptoms for several months that has been unchanged.  She reports that several months ago she had transient right vision loss that also came and went and did not tell anybody about this.  She has a very strong family history of strokes and is concerned about this.  She denies any recent head injuries.  She denies having headache with the vision change.  She denies nausea, vomiting, new coordination problems, numbness, tingling, weakness of extremities.  She denies any speech difficulty.  She reports he is at her baseline on arrival.  On exam, patient had normal extraocular movements, pupil exam, and visual fields.  Speech was clear and she had a symmetric smile.  Normal sensation strength and coordination with finger-nose-finger testing bilaterally in arms.  Exam otherwise unremarkable.  Patient resting comfortably.  Spoke with neurology who recommends CTA head and neck as  well as MRI brain without.  As she had 2 separate episodes with 2 separate problems concerning for TIA, neurology recommends admission for full TIA work-up.  Imaging orders will be placed and hospitalist team will be called for admission.  Neurology requests patient be admitted to [facility] so he can see her tonight.  Patient will be admitted for further management.    Final Clinical Impressions(s) / ED Diagnoses   Final diagnoses:  TIA (transient ischemic attack)  Transient vision disturbance, right  Transient memory loss    ED Discharge Orders    None      Clinical Impression: 1. TIA (transient ischemic attack)   2. Transient vision disturbance, right   3. Transient memory loss     Disposition: Admit  This note was prepared with assistance of Dragon voice recognition software. Occasional wrong-word or sound-a-like substitutions may have occurred due to the  inherent limitations of voice recognition software.     Jagdeep Ancheta, Gwenyth Allegra, MD 03/22/19 [phone]

## 2019-03-21 NOTE — Progress Notes (Signed)
Pt arrived from [facility] ED with stroke like symptoms, alert and oriented, denies any pain at this time, settled in bed with call light at bedside, tele monitor put and verified on pt, was however reassured and will continue to monitor, v/s stable. [name], Dana Bryan

## 2019-03-21 NOTE — ED Notes (Signed)
Urine and culture sent to lab  

## 2019-03-21 NOTE — ED Notes (Signed)
[facility] at bedside 

## 2019-03-21 NOTE — Progress Notes (Signed)
PHARMACIST - PHYSICIAN ORDER COMMUNICATION  CONCERNING: P&T Medication Policy on Herbal Medications  DESCRIPTION:  This patient's order for:  Red Yeast Rice  has been noted.  This product(s) is classified as an "herbal" or natural product. Due to a lack of definitive safety studies or FDA approval, nonstandard manufacturing practices, plus the potential risk of unknown drug-drug interactions while on inpatient medications, the Pharmacy and Therapeutics Committee does not permit the use of "herbal" or natural products of this type within [facility].   ACTION TAKEN: The pharmacy department is unable to verify this order at this time and your patient has been informed of this safety policy. Please reevaluate patient's clinical condition at discharge and address if the herbal or natural product(s) should be resumed at that time.

## 2019-03-21 NOTE — ED Notes (Signed)
ED TO INPATIENT HANDOFF REPORT  Name/Age/Gender Dana Bryan 68 y.o. female  Code Status   Home/SNF/Other Home  Chief Complaint Vision loss, AMS, sent by dr  Level of Care/Admitting Diagnosis ED Disposition    ED Disposition Condition [place]: [place] [[id number]]  Level of Care: Telemetry Medical [104]  I expect the patient will be discharged within 24 hours: Yes  LOW acuity---Tx typically complete <24 hrs---ACUTE conditions typically can be evaluated <24 hours---LABS likely to return to acceptable levels <24 hours---IS near functional baseline---EXPECTED to return to current living arrangement---NOT newly hypoxic: Meets criteria for 5C-Observation unit  Diagnosis: TIA (transient ischemic attack) [[id number]]  Admitting Physician: Elwyn Reach [[id number]]  Attending Physician: Elwyn Reach [[id number]]  PT Class (Do Not Modify): Observation [104]  PT Acc Code (Do Not Modify): Observation [10022]       Medical History Past Medical History:  Diagnosis Date  . Hyperlipidemia   . Reactive depression (situational)   . Vasomotor rhinitis     Allergies No Known Allergies  IV Location/Drains/Wounds Patient Lines/Drains/Airways Status   Active Line/Drains/Airways    Name:   Placement date:   Placement time:   Site:   Days:   Peripheral IV 03/21/19 Right Antecubital   03/21/19    2030    Antecubital   less than 1          Labs/Imaging Results for orders placed or performed during the hospital encounter of 03/21/19 (from the past 48 hour(s))  CBG monitoring, ED     Status: None   Collection Time: 03/21/19  6:43 PM  Result Value Ref Range   Glucose-Capillary 85 70 - 99 mg/dL  Ethanol     Status: None   Collection Time: 03/21/19  8:11 PM  Result Value Ref Range   Alcohol, Ethyl (B) <10 <10 mg/dL    Comment: (NOTE) Lowest detectable limit for serum alcohol is 10 mg/dL. For medical purposes only. Performed at [facility], [place] [address], [place], [place] [address]   Protime-INR     Status: None   Collection Time: 03/21/19  8:11 PM  Result Value Ref Range   Prothrombin Time 12.9 11.4 - 15.2 seconds   INR 1.0 0.8 - 1.2    Comment: (NOTE) INR goal varies based on device and disease states. Performed at [facility], [place] [address] [place], [place] [address]   APTT     Status: None   Collection Time: 03/21/19  8:11 PM  Result Value Ref Range   aPTT 28 24 - 36 seconds    Comment: Performed at [facility], [place] [address], [place], [place] [address]  Comprehensive metabolic panel     Status: Abnormal   Collection Time: 03/21/19  8:11 PM  Result Value Ref Range   Sodium 136 135 - 145 mmol/L   Potassium 3.9 3.5 - 5.1 mmol/L   Chloride 99 98 - 111 mmol/L   CO2 24 22 - 32 mmol/L   Glucose, Bld 100 (H) 70 - 99 mg/dL   BUN 12 8 - 23 mg/dL   Creatinine, Ser 0.67 0.44 - 1.00 mg/dL   Calcium 9.4 8.9 - 10.3 mg/dL   Total Protein 8.7 (H) 6.5 - 8.1 g/dL   Albumin 4.8 3.5 - 5.0 g/dL   AST 16 15 - 41 U/L   ALT 17 0 - 44 U/L   Alkaline Phosphatase 140 (H) 38 - 126  U/L   Total Bilirubin 0.7 0.3 - 1.2 mg/dL   GFR calc non Af Amer >60 >60 mL/min   GFR calc Af Amer >60 >60 mL/min   Anion gap 13 5 - 15    Comment: Performed at [facility], [place] [address] [place], [place] [address]  CBC WITH DIFFERENTIAL     Status: None   Collection Time: 03/21/19  8:11 PM  Result Value Ref Range   WBC 7.2 4.0 - 10.5 K/uL    Comment: WHITE COUNT CONFIRMED ON SMEAR   RBC 4.80 3.87 - 5.11 MIL/uL   Hemoglobin 14.6 12.0 - 15.0 g/dL   HCT 44.1 36.0 - 46.0 %   MCV 91.9 80.0 - 100.0 fL   MCH 30.4 26.0 - 34.0 pg   MCHC 33.1 30.0 - 36.0 g/dL   RDW 13.1 11.5 - 15.5 %   Platelets 301 150 - 400 K/uL   nRBC 0.0 0.0 - 0.2 %   Neutrophils Relative % 71 %   Neutro Abs 5.1 1.7 - 7.7 K/uL   Lymphocytes Relative 26 %   Lymphs Abs 1.9 0.7 - 4.0 K/uL    Monocytes Relative 3 %   Monocytes Absolute 0.2 0.1 - 1.0 K/uL   Eosinophils Relative 0 %   Eosinophils Absolute 0.0 0.0 - 0.5 K/uL   Basophils Relative 0 %   Basophils Absolute 0.0 0.0 - 0.1 K/uL   Abs Immature Granulocytes 0.00 0.00 - 0.07 K/uL    Comment: Performed at [facility], [place] [address] [place], [place] [address]  Urine rapid drug screen (hosp performed)     Status: None   Collection Time: 03/21/19  8:24 PM  Result Value Ref Range   Opiates NONE DETECTED NONE DETECTED   Cocaine NONE DETECTED NONE DETECTED   Benzodiazepines NONE DETECTED NONE DETECTED   Amphetamines NONE DETECTED NONE DETECTED   Tetrahydrocannabinol NONE DETECTED NONE DETECTED   Barbiturates NONE DETECTED NONE DETECTED    Comment: (NOTE) DRUG SCREEN FOR MEDICAL PURPOSES ONLY.  IF CONFIRMATION IS NEEDED FOR ANY PURPOSE, NOTIFY LAB WITHIN 5 DAYS. LOWEST DETECTABLE LIMITS FOR URINE DRUG SCREEN Drug Class                     Cutoff (ng/mL) Amphetamine and metabolites    1000 Barbiturate and metabolites    200 Benzodiazepine                 706 Tricyclics and metabolites     300 Opiates and metabolites        300 Cocaine and metabolites        300 THC                            50 Performed at [facility], [place] [address], [place], [place] [address]   Urinalysis, Routine w reflex microscopic     Status: Abnormal   Collection Time: 03/21/19  8:24 PM  Result Value Ref Range   Color, Urine COLORLESS (A) YELLOW   APPearance CLEAR CLEAR   Specific Gravity, Urine 1.003 (L) 1.005 - 1.030   pH 6.0 5.0 - 8.0   Glucose, UA NEGATIVE NEGATIVE mg/dL   Hgb urine dipstick SMALL (A) NEGATIVE   Bilirubin Urine NEGATIVE NEGATIVE   Ketones, ur NEGATIVE NEGATIVE mg/dL   Protein, ur NEGATIVE NEGATIVE mg/dL   Nitrite NEGATIVE NEGATIVE   Leukocytes,Ua NEGATIVE NEGATIVE   RBC /  HPF 0-5 0 - 5 RBC/hpf   WBC, UA 0-5 0 - 5 WBC/hpf   Bacteria, UA RARE (A) NONE SEEN   Squamous  Epithelial / LPF 0-5 0 - 5   Mucus PRESENT     Comment: Performed at [facility], [place] [address], [place], [place] [address]  I-stat Creatinine, ED     Status: None   Collection Time: 03/21/19  8:39 PM  Result Value Ref Range   Creatinine, Ser 0.60 0.44 - 1.00 mg/dL   Ct Angio Head W Or Wo Contrast  Result Date: 03/21/2019 CLINICAL DATA:  Transient vision and memory loss.  Assess TIA. EXAM: CT ANGIOGRAPHY HEAD AND NECK TECHNIQUE: Multidetector CT imaging of the head and neck was performed using the standard protocol during bolus administration of intravenous contrast. Multiplanar CT image reconstructions and MIPs were obtained to evaluate the vascular anatomy. Carotid stenosis measurements (when applicable) are obtained utilizing NASCET criteria, using the distal internal carotid diameter as the denominator. CONTRAST:  43mL OMNIPAQUE IOHEXOL 350 MG/ML SOLN COMPARISON:  None. FINDINGS: CT HEAD FINDINGS BRAIN: No intraparenchymal hemorrhage, mass effect nor midline shift. No parenchymal brain volume loss for age. No hydrocephalus. No acute large vascular territory infarcts. No abnormal extra-axial fluid collections. Basal cisterns are patent. VASCULAR: Mild calcific atherosclerosis of the carotid siphons. SKULL: No skull fracture. Subcentimeter parietal calvarial probable hemangioma. No significant scalp soft tissue swelling. SINUSES/ORBITS: Paranasal sinuses are well aerated. Mastoid air cells are well aerated.The included ocular globes and orbital contents are non-suspicious. OTHER: None. CTA NECK FINDINGS: AORTIC ARCH: Normal appearance of the thoracic arch, 2 vessel arch is a normal variant. Mild calcific atherosclerosis aortic arch. The origins of the innominate, left Common carotid artery and subclavian artery are patent. RIGHT CAROTID SYSTEM: Common carotid artery is patent. Normal appearance of the carotid bifurcation without hemodynamically significant stenosis by NASCET  criteria. Normal appearance of the internal carotid artery. LEFT CAROTID SYSTEM: Common carotid artery is patent. Mild calcific atherosclerosis of the carotid bifurcation without hemodynamically significant stenosis by NASCET criteria. Normal appearance of the internal carotid artery. VERTEBRAL ARTERIES:RIGHT vertebral artery is dominant. Patent bilateral vertebral arteries, beam hardening artifact through LEFT vertebral artery without flow-limiting stenosis. SKELETON: No acute osseous process though bone windows have not been submitted. OTHER NECK: Soft tissues of the neck are nonacute though, not tailored for evaluation. Cervical spondylosis resulting in severe C5-6 neural foraminal narrowing. UPPER CHEST: Biapical pleuroparenchymal scarring clear. No superior mediastinal lymphadenopathy. CTA HEAD FINDINGS: ANTERIOR CIRCULATION: Patent cervical internal carotid arteries, petrous, cavernous and supra clinoid internal carotid arteries. Patent anterior communicating artery. Patent anterior and middle cerebral arteries. No large vessel occlusion, flow-limiting stenosis, contrast extravasation or aneurysm. POSTERIOR CIRCULATION: Patent vertebral arteries, vertebrobasilar junction and basilar artery, as well as main branch vessels. Patent posterior cerebral arteries. Robust bilateral posterior communicating arteries present. No large vessel occlusion, flow-limiting stenosis, contrast extravasation or aneurysm. VENOUS SINUSES: Major dural venous sinuses are patent though not tailored for evaluation on this angiographic examination. ANATOMIC VARIANTS: None. DELAYED PHASE: Not performed. MIP images reviewed. IMPRESSION: 1. Negative CT HEAD with and without contrast for age. 2. Negative CTA HEAD and CTA NECK. Aortic Atherosclerosis (ICD10-I70.0). Electronically Signed   By: Elon Alas M.D.   On: 03/21/2019 21:49   Ct Angio Neck W Or Wo Contrast  Result Date: 03/21/2019 CLINICAL DATA:  Transient vision and memory  loss.  Assess TIA. EXAM: CT ANGIOGRAPHY HEAD AND NECK TECHNIQUE: Multidetector CT imaging of the head and neck  was performed using the standard protocol during bolus administration of intravenous contrast. Multiplanar CT image reconstructions and MIPs were obtained to evaluate the vascular anatomy. Carotid stenosis measurements (when applicable) are obtained utilizing NASCET criteria, using the distal internal carotid diameter as the denominator. CONTRAST:  50mL OMNIPAQUE IOHEXOL 350 MG/ML SOLN COMPARISON:  None. FINDINGS: CT HEAD FINDINGS BRAIN: No intraparenchymal hemorrhage, mass effect nor midline shift. No parenchymal brain volume loss for age. No hydrocephalus. No acute large vascular territory infarcts. No abnormal extra-axial fluid collections. Basal cisterns are patent. VASCULAR: Mild calcific atherosclerosis of the carotid siphons. SKULL: No skull fracture. Subcentimeter parietal calvarial probable hemangioma. No significant scalp soft tissue swelling. SINUSES/ORBITS: Paranasal sinuses are well aerated. Mastoid air cells are well aerated.The included ocular globes and orbital contents are non-suspicious. OTHER: None. CTA NECK FINDINGS: AORTIC ARCH: Normal appearance of the thoracic arch, 2 vessel arch is a normal variant. Mild calcific atherosclerosis aortic arch. The origins of the innominate, left Common carotid artery and subclavian artery are patent. RIGHT CAROTID SYSTEM: Common carotid artery is patent. Normal appearance of the carotid bifurcation without hemodynamically significant stenosis by NASCET criteria. Normal appearance of the internal carotid artery. LEFT CAROTID SYSTEM: Common carotid artery is patent. Mild calcific atherosclerosis of the carotid bifurcation without hemodynamically significant stenosis by NASCET criteria. Normal appearance of the internal carotid artery. VERTEBRAL ARTERIES:RIGHT vertebral artery is dominant. Patent bilateral vertebral arteries, beam hardening artifact  through LEFT vertebral artery without flow-limiting stenosis. SKELETON: No acute osseous process though bone windows have not been submitted. OTHER NECK: Soft tissues of the neck are nonacute though, not tailored for evaluation. Cervical spondylosis resulting in severe C5-6 neural foraminal narrowing. UPPER CHEST: Biapical pleuroparenchymal scarring clear. No superior mediastinal lymphadenopathy. CTA HEAD FINDINGS: ANTERIOR CIRCULATION: Patent cervical internal carotid arteries, petrous, cavernous and supra clinoid internal carotid arteries. Patent anterior communicating artery. Patent anterior and middle cerebral arteries. No large vessel occlusion, flow-limiting stenosis, contrast extravasation or aneurysm. POSTERIOR CIRCULATION: Patent vertebral arteries, vertebrobasilar junction and basilar artery, as well as main branch vessels. Patent posterior cerebral arteries. Robust bilateral posterior communicating arteries present. No large vessel occlusion, flow-limiting stenosis, contrast extravasation or aneurysm. VENOUS SINUSES: Major dural venous sinuses are patent though not tailored for evaluation on this angiographic examination. ANATOMIC VARIANTS: None. DELAYED PHASE: Not performed. MIP images reviewed. IMPRESSION: 1. Negative CT HEAD with and without contrast for age. 2. Negative CTA HEAD and CTA NECK. Aortic Atherosclerosis (ICD10-I70.0). Electronically Signed   By: Elon Alas M.D.   On: 03/21/2019 21:49    Pending Labs Unresulted Labs (From admission, onward)    Start     Ordered   03/21/19 2011  CBC  (Stroke Panel (PNL))  ONCE - STAT,   STAT     03/21/19 2012   03/21/19 2011  Differential  (Stroke Panel (PNL))  ONCE - STAT,   STAT     03/21/19 2012   Signed and Held  HIV antibody (Routine Testing)  Once,   R     Signed and Held   Signed and Held  Hemoglobin A1c  Tomorrow morning,   R     Signed and Held   Signed and Held  Lipid panel  Tomorrow morning,   R    Comments:  Fasting     Signed and Held   Signed and Held  CBC  (enoxaparin (LOVENOX)    CrCl >/= 30 ml/min)  Once,   R    Comments:  Baseline for enoxaparin therapy IF  NOT ALREADY DRAWN.  Notify MD if PLT < 100 K.    Signed and Held   Signed and Held  Creatinine, serum  (enoxaparin (LOVENOX)    CrCl >/= 30 ml/min)  Once,   R    Comments:  Baseline for enoxaparin therapy IF NOT ALREADY DRAWN.    Signed and Held   Signed and Held  Creatinine, serum  (enoxaparin (LOVENOX)    CrCl >/= 30 ml/min)  Weekly,   R    Comments:  while on enoxaparin therapy    Signed and Held          Vitals/Pain Today's Vitals   03/21/19 1846 03/21/19 1901 03/21/19 2036 03/21/19 2100  BP: (!) 175/92   (!) 184/87  Pulse: 91   71  Resp: 15   17  Temp: 97.9 F (36.6 C)     TempSrc: Oral     SpO2: 99%   100%  Weight: 61.2 kg     Height: 5' 3.5" (1.613 m)     PainSc:  0-No pain 0-No pain     Isolation Precautions No active isolations  Medications Medications  sodium chloride (PF) 0.9 % injection (has no administration in time range)  LORazepam (ATIVAN) injection 0.5 mg (0.5 mg Intravenous Given 03/21/19 2103)  iohexol (OMNIPAQUE) 350 MG/ML injection 100 mL (80 mLs Intravenous Contrast Given 03/21/19 2110)    Mobility walks

## 2019-03-21 NOTE — ED Notes (Signed)
Pt ambulated to the bathroom with no assistance. Gait steady  

## 2019-03-21 NOTE — Telephone Encounter (Signed)
Spoke with patient and advised that due to her intermittent episodes of vision changes and confusion. In the absence of PCP, the PA recommended patient to go to the ER for evaluation to rule out a TIA. Patient states on the phone call she is just having a headache-took Ibuprofen Denied confusion or vision changes at this time Encouraged patient due to intermittent symptoms recommend evaluation to ER.  She is agreeable. She will go to [facility] since is closer.

## 2019-03-21 NOTE — ED Notes (Signed)
Attempted to call report, RN unavailable.

## 2019-03-21 NOTE — ED Notes (Signed)
Report given to [facility], paperwork printed

## 2019-03-21 NOTE — ED Notes (Signed)
MRI called back and informed pt is available now

## 2019-03-21 NOTE — ED Notes (Signed)
[facility] will be contacted to transport patient to [facility] once patient returns from MRI

## 2019-03-21 NOTE — ED Notes (Signed)
Pt leaving with [facility] to go to [facility]

## 2019-03-21 NOTE — ED Notes (Signed)
Pt transported to MRI 

## 2019-03-21 NOTE — ED Notes (Addendum)
Pt transported to CT ?

## 2019-03-21 NOTE — H&P (Signed)
History and Physical   Dana Bryan BZJ:[id number] DOB: Jul 05, 1951 DOA: 03/21/2019  Referring MD/NP/PA: Dr. Sherry Ruffing  PCP: Midge Minium, MD   Outpatient Specialists: None  Patient coming from: Home  Chief Complaint: Visual changes  HPI: Dana Bryan is a 68 y.o. female with medical history significant of hyperlipidemia, depression, history of rhinitis, who presented with sudden onset of cough, forgetfulness and visual changes.  Patient has had upper respite tract infection for about 2 months on and off.  She has been having residual cough from that.  She called her primary care physician due to transient changes in her vision and memory loss.  This happened about noon today and lasted about 10 to 15 minutes.  She was watching the news when she noted the right side of everyone's face and the right half of the television was completely dark.  And this is both eyes.  Symptoms have since resolved.  She spoke with her friend who noted that she was confused and forgetful of names of people including her granddaughter.  And that lasted a while.  After calling her PCP she was asked to come to the ER.  In the ER a code stroke was called for.  Symptoms have since resolved.  Neurology consulted over the phone and recommended TIA work-up.  She is being transferred to [facility] for TIA work-up..  ED Course: Temperature is 97.9 blood pressure 180/87 pulse 91 respiratory 21 oxygen sat 96 on room air.  CBC and chemistry all appear to be within normal.  Head CT without contrast is negative and MRI of the brain showed no acute findings.  Urine drug screen is negative.  Patient be admitted to complete TIA work-up.  Review of Systems: As per HPI otherwise 10 point review of systems negative.    Past Medical History:  Diagnosis Date   Hyperlipidemia    Reactive depression (situational)    Vasomotor rhinitis     Past Surgical History:  Procedure Laterality Date   TONSILLECTOMY       reports that she quit smoking about 15 years ago. Her smoking use included cigarettes. She has never used smokeless tobacco. No history on file for alcohol and drug.  No Known Allergies  Family History  Problem Relation Age of Onset   Alcohol abuse Father    Hypertension Mother      Prior to Admission medications   Medication Sig Start Date End Date Taking? Authorizing Provider  ALPRAZolam (XANAX) 0.25 MG tablet Take 1 tablet (0.25 mg total) by mouth daily as needed. Patient taking differently: Take 0.125-0.25 mg by mouth daily as needed for anxiety.  11/03/16  Yes Hoyt Koch, MD  buPROPion (WELLBUTRIN XL) 150 MG 24 hr tablet Take 1 tablet (150 mg total) by mouth daily. 03/01/19  Yes Midge Minium, MD  ibuprofen (ADVIL,MOTRIN) 200 MG tablet Take 400 mg by mouth daily as needed for headache.   Yes [provider]  loratadine (CLARITIN) 10 MG tablet Take 10 mg by mouth daily.   Yes [provider]  Red Yeast Rice Extract (RED YEAST RICE PO) Take 1,200 mg by mouth daily.   Yes [provider]    Physical Exam: Vitals:   03/21/19 1846 03/21/19 2100 03/21/19 2230 03/21/19 2241  BP: (!) 175/92 (!) 184/87 (!) 142/92 (!) 142/92  Pulse: 91 71 85 86  Resp: 15 17 (!) 21 16  Temp: 97.9 F (36.6 C)     TempSrc: Oral  SpO2: 99% 100% 97% 96%  Weight: 61.2 kg     Height: 5' 3.5" (1.613 m)         Constitutional: NAD, calm, comfortable Vitals:   03/21/19 1846 03/21/19 2100 03/21/19 2230 03/21/19 2241  BP: (!) 175/92 (!) 184/87 (!) 142/92 (!) 142/92  Pulse: 91 71 85 86  Resp: 15 17 (!) 21 16  Temp: 97.9 F (36.6 C)     TempSrc: Oral     SpO2: 99% 100% 97% 96%  Weight: 61.2 kg     Height: 5' 3.5" (1.613 m)      Eyes: PERRL, lids and conjunctivae normal ENMT: Mucous membranes are moist. Posterior pharynx clear of any exudate or lesions.Normal dentition.  Neck: normal, supple, no masses, no thyromegaly Respiratory: clear to auscultation  bilaterally, no wheezing, no crackles. Normal respiratory effort. No accessory muscle use.  Cardiovascular: Regular rate and rhythm, no murmurs / rubs / gallops. No extremity edema. 2+ pedal pulses. No carotid bruits.  Abdomen: no tenderness, no masses palpated. No hepatosplenomegaly. Bowel sounds positive.  Musculoskeletal: no clubbing / cyanosis. No joint deformity upper and lower extremities. Good ROM, no contractures. Normal muscle tone.  Skin: no rashes, lesions, ulcers. No induration Neurologic: CN 2-12 grossly intact. Sensation intact, DTR normal. Strength 5/5 in all 4.  Psychiatric: Normal judgment and insight. Alert and oriented x 3. Normal mood.     Labs on Admission: I have personally reviewed following labs and imaging studies  CBC: Recent Labs  Lab 03/21/19 2011  WBC 7.2  NEUTROABS 5.1  HGB 14.6  HCT 44.1  MCV 91.9  PLT 196   Basic Metabolic Panel: Recent Labs  Lab 03/21/19 2011 03/21/19 2039  NA 136  --   K 3.9  --   CL 99  --   CO2 24  --   GLUCOSE 100*  --   BUN 12  --   CREATININE 0.67 0.60  CALCIUM 9.4  --    GFR: Estimated Creatinine Clearance: 58.5 mL/min (by C-G formula based on SCr of 0.6 mg/dL). Liver Function Tests: Recent Labs  Lab 03/21/19 2011  AST 16  ALT 17  ALKPHOS 140*  BILITOT 0.7  PROT 8.7*  ALBUMIN 4.8   No results for input(s): LIPASE, AMYLASE in the last 168 hours. No results for input(s): AMMONIA in the last 168 hours. Coagulation Profile: Recent Labs  Lab 03/21/19 2011  INR 1.0   Cardiac Enzymes: No results for input(s): CKTOTAL, CKMB, CKMBINDEX, TROPONINI in the last 168 hours. BNP (last 3 results) No results for input(s): PROBNP in the last 8760 hours. HbA1C: No results for input(s): HGBA1C in the last 72 hours. CBG: Recent Labs  Lab 03/21/19 1843  GLUCAP 85   Lipid Profile: No results for input(s): CHOL, HDL, LDLCALC, TRIG, CHOLHDL, LDLDIRECT in the last 72 hours. Thyroid Function Tests: No results for  input(s): TSH, T4TOTAL, FREET4, T3FREE, THYROIDAB in the last 72 hours. Anemia Panel: No results for input(s): VITAMINB12, FOLATE, FERRITIN, TIBC, IRON, RETICCTPCT in the last 72 hours. Urine analysis:    Component Value Date/Time   COLORURINE COLORLESS (A) 03/21/2019 2024   APPEARANCEUR CLEAR 03/21/2019 2024   LABSPEC 1.003 (L) 03/21/2019 2024   PHURINE 6.0 03/21/2019 2024   GLUCOSEU NEGATIVE 03/21/2019 2024   GLUCOSEU NEGATIVE 07/30/2011 0739   HGBUR SMALL (A) 03/21/2019 2024   BILIRUBINUR NEGATIVE 03/21/2019 [place] NEGATIVE 03/21/2019 2024   PROTEINUR NEGATIVE 03/21/2019 2024   UROBILINOGEN 0.2 07/30/2011 0739  NITRITE NEGATIVE 03/21/2019 2024   LEUKOCYTESUR NEGATIVE 03/21/2019 2024   Sepsis Labs: @LABRCNTIP (procalcitonin:4,lacticidven:4) )No results found for this or any previous visit (from the past 240 hour(s)).   Radiological Exams on Admission: Ct Angio Head W Or Wo Contrast  Result Date: 03/21/2019 CLINICAL DATA:  Transient vision and memory loss.  Assess TIA. EXAM: CT ANGIOGRAPHY HEAD AND NECK TECHNIQUE: Multidetector CT imaging of the head and neck was performed using the standard protocol during bolus administration of intravenous contrast. Multiplanar CT image reconstructions and MIPs were obtained to evaluate the vascular anatomy. Carotid stenosis measurements (when applicable) are obtained utilizing NASCET criteria, using the distal internal carotid diameter as the denominator. CONTRAST:  86mL OMNIPAQUE IOHEXOL 350 MG/ML SOLN COMPARISON:  None. FINDINGS: CT HEAD FINDINGS BRAIN: No intraparenchymal hemorrhage, mass effect nor midline shift. No parenchymal brain volume loss for age. No hydrocephalus. No acute large vascular territory infarcts. No abnormal extra-axial fluid collections. Basal cisterns are patent. VASCULAR: Mild calcific atherosclerosis of the carotid siphons. SKULL: No skull fracture. Subcentimeter parietal calvarial probable hemangioma. No  significant scalp soft tissue swelling. SINUSES/ORBITS: Paranasal sinuses are well aerated. Mastoid air cells are well aerated.The included ocular globes and orbital contents are non-suspicious. OTHER: None. CTA NECK FINDINGS: AORTIC ARCH: Normal appearance of the thoracic arch, 2 vessel arch is a normal variant. Mild calcific atherosclerosis aortic arch. The origins of the innominate, left Common carotid artery and subclavian artery are patent. RIGHT CAROTID SYSTEM: Common carotid artery is patent. Normal appearance of the carotid bifurcation without hemodynamically significant stenosis by NASCET criteria. Normal appearance of the internal carotid artery. LEFT CAROTID SYSTEM: Common carotid artery is patent. Mild calcific atherosclerosis of the carotid bifurcation without hemodynamically significant stenosis by NASCET criteria. Normal appearance of the internal carotid artery. VERTEBRAL ARTERIES:RIGHT vertebral artery is dominant. Patent bilateral vertebral arteries, beam hardening artifact through LEFT vertebral artery without flow-limiting stenosis. SKELETON: No acute osseous process though bone windows have not been submitted. OTHER NECK: Soft tissues of the neck are nonacute though, not tailored for evaluation. Cervical spondylosis resulting in severe C5-6 neural foraminal narrowing. UPPER CHEST: Biapical pleuroparenchymal scarring clear. No superior mediastinal lymphadenopathy. CTA HEAD FINDINGS: ANTERIOR CIRCULATION: Patent cervical internal carotid arteries, petrous, cavernous and supra clinoid internal carotid arteries. Patent anterior communicating artery. Patent anterior and middle cerebral arteries. No large vessel occlusion, flow-limiting stenosis, contrast extravasation or aneurysm. POSTERIOR CIRCULATION: Patent vertebral arteries, vertebrobasilar junction and basilar artery, as well as main branch vessels. Patent posterior cerebral arteries. Robust bilateral posterior communicating arteries present.  No large vessel occlusion, flow-limiting stenosis, contrast extravasation or aneurysm. VENOUS SINUSES: Major dural venous sinuses are patent though not tailored for evaluation on this angiographic examination. ANATOMIC VARIANTS: None. DELAYED PHASE: Not performed. MIP images reviewed. IMPRESSION: 1. Negative CT HEAD with and without contrast for age. 2. Negative CTA HEAD and CTA NECK. Aortic Atherosclerosis (ICD10-I70.0). Electronically Signed   By: Elon Alas M.D.   On: 03/21/2019 21:49   Ct Angio Neck W Or Wo Contrast  Result Date: 03/21/2019 CLINICAL DATA:  Transient vision and memory loss.  Assess TIA. EXAM: CT ANGIOGRAPHY HEAD AND NECK TECHNIQUE: Multidetector CT imaging of the head and neck was performed using the standard protocol during bolus administration of intravenous contrast. Multiplanar CT image reconstructions and MIPs were obtained to evaluate the vascular anatomy. Carotid stenosis measurements (when applicable) are obtained utilizing NASCET criteria, using the distal internal carotid diameter as the denominator. CONTRAST:  7mL OMNIPAQUE IOHEXOL 350 MG/ML SOLN COMPARISON:  None.  FINDINGS: CT HEAD FINDINGS BRAIN: No intraparenchymal hemorrhage, mass effect nor midline shift. No parenchymal brain volume loss for age. No hydrocephalus. No acute large vascular territory infarcts. No abnormal extra-axial fluid collections. Basal cisterns are patent. VASCULAR: Mild calcific atherosclerosis of the carotid siphons. SKULL: No skull fracture. Subcentimeter parietal calvarial probable hemangioma. No significant scalp soft tissue swelling. SINUSES/ORBITS: Paranasal sinuses are well aerated. Mastoid air cells are well aerated.The included ocular globes and orbital contents are non-suspicious. OTHER: None. CTA NECK FINDINGS: AORTIC ARCH: Normal appearance of the thoracic arch, 2 vessel arch is a normal variant. Mild calcific atherosclerosis aortic arch. The origins of the innominate, left Common  carotid artery and subclavian artery are patent. RIGHT CAROTID SYSTEM: Common carotid artery is patent. Normal appearance of the carotid bifurcation without hemodynamically significant stenosis by NASCET criteria. Normal appearance of the internal carotid artery. LEFT CAROTID SYSTEM: Common carotid artery is patent. Mild calcific atherosclerosis of the carotid bifurcation without hemodynamically significant stenosis by NASCET criteria. Normal appearance of the internal carotid artery. VERTEBRAL ARTERIES:RIGHT vertebral artery is dominant. Patent bilateral vertebral arteries, beam hardening artifact through LEFT vertebral artery without flow-limiting stenosis. SKELETON: No acute osseous process though bone windows have not been submitted. OTHER NECK: Soft tissues of the neck are nonacute though, not tailored for evaluation. Cervical spondylosis resulting in severe C5-6 neural foraminal narrowing. UPPER CHEST: Biapical pleuroparenchymal scarring clear. No superior mediastinal lymphadenopathy. CTA HEAD FINDINGS: ANTERIOR CIRCULATION: Patent cervical internal carotid arteries, petrous, cavernous and supra clinoid internal carotid arteries. Patent anterior communicating artery. Patent anterior and middle cerebral arteries. No large vessel occlusion, flow-limiting stenosis, contrast extravasation or aneurysm. POSTERIOR CIRCULATION: Patent vertebral arteries, vertebrobasilar junction and basilar artery, as well as main branch vessels. Patent posterior cerebral arteries. Robust bilateral posterior communicating arteries present. No large vessel occlusion, flow-limiting stenosis, contrast extravasation or aneurysm. VENOUS SINUSES: Major dural venous sinuses are patent though not tailored for evaluation on this angiographic examination. ANATOMIC VARIANTS: None. DELAYED PHASE: Not performed. MIP images reviewed. IMPRESSION: 1. Negative CT HEAD with and without contrast for age. 2. Negative CTA HEAD and CTA NECK. Aortic  Atherosclerosis (ICD10-I70.0). Electronically Signed   By: Elon Alas M.D.   On: 03/21/2019 21:49   Mr Brain Wo Contrast  Result Date: 03/21/2019 CLINICAL DATA:  Transient vision and memory loss today. History of hypertension, hyperlipidemia. EXAM: MRI HEAD WITHOUT CONTRAST TECHNIQUE: Multiplanar, multiecho pulse sequences of the brain and surrounding structures were obtained without intravenous contrast. COMPARISON:  CT HEAD March 21, 2019 FINDINGS: Mild motion degraded examination. INTRACRANIAL CONTENTS: No reduced diffusion to suggest acute ischemia. No susceptibility artifact to suggest hemorrhage. No parenchymal brain volume loss for age. No hydrocephalus. A few scattered subcentimeter supratentorial white matter FLAIR T2 hyperintensities seen with chronic small vessel ischemic changes, less than expected for age. No suspicious parenchymal signal, masses, mass effect. No abnormal extra-axial fluid collections. No extra-axial masses. VASCULAR: Normal major intracranial vascular flow voids present at skull base. SKULL AND UPPER CERVICAL SPINE: No abnormal sellar expansion. No suspicious calvarial bone marrow signal. Craniocervical junction maintained. SINUSES/ORBITS: The mastoid air-cells and included paranasal sinuses are well-aerated.The included ocular globes and orbital contents are non-suspicious. OTHER: None. IMPRESSION: 1. Negative motion degraded noncontrast MRI head for age. Electronically Signed   By: Elon Alas M.D.   On: 03/21/2019 22:33    EKG: Independently reviewed.  It shows normal sinus rhythm with a rate of 81.  No significant ST changes.  Assessment/Plan Principal Problem:   TIA (transient ischemic  attack) Active Problems:   Hyperlipidemia   Anxiety and depression   Alcohol abuse, daily use     #1 TIA: Patient will be admitted to [facility] for observation.  Check carotid Dopplers as well as echocardiogram.  Neurology to evaluate patient.  Initiate aspirin and  Plavix for now.  She might require Plavix for 3 weeks based on new recommendations.  Also continue with Lipitor.  #2 hyperlipidemia: again continue with Lipitor 40 mg daily.  #3 hypertension: no blood pressure medications at the moment.  May need treatment at discharge.  #4 depression with anxiety: Continue home regimen.  #5 history of alcohol abuse: Patient denied alcohol intake now.  Watch for any withdrawal symptoms.   DVT prophylaxis: Lovenox Code Status: Full code Family Communication: No family at bedside discussed care with patient Disposition Plan: Home Consults called: Neurology Dr. Katherine Roan Admission status: Observation  Severity of Illness: The appropriate patient status for this patient is OBSERVATION. Observation status is judged to be reasonable and necessary in order to provide the required intensity of service to ensure the patient's safety. The patient's presenting symptoms, physical exam findings, and initial radiographic and laboratory data in the context of their medical condition is felt to place them at decreased risk for further clinical deterioration. Furthermore, it is anticipated that the patient will be medically stable for discharge from the hospital within 2 midnights of admission. The following factors support the patient status of observation.   " The patient's presenting symptoms include memory loss with visual changes. " The physical exam findings include no findings at this point. " The initial radiographic and laboratory data are negative findings.     Barbette Merino MD [facility] Pager [phone]  If 7PM-7AM, please contact night-coverage [email or web address] Password [facility]  03/21/2019, 11:12 PM

## 2019-03-21 NOTE — ED Notes (Signed)
MD at bedside. 

## 2019-03-21 NOTE — ED Notes (Signed)
MRI called. Pt is alert and oriented but claustrophobic. MD aware

## 2019-03-21 NOTE — ED Triage Notes (Signed)
Pt c/o blurry vision this afternoon for 10 minutes then cleared, later Pt states she had some brief memory loss. Pt also c/o cough and runny nose for several weeks.

## 2019-03-22 ENCOUNTER — Encounter (HOSPITAL_COMMUNITY): Payer: Medicare HMO

## 2019-03-22 ENCOUNTER — Observation Stay (HOSPITAL_BASED_OUTPATIENT_CLINIC_OR_DEPARTMENT_OTHER): Payer: Medicare HMO

## 2019-03-22 DIAGNOSIS — E785 Hyperlipidemia, unspecified: Secondary | ICD-10-CM

## 2019-03-22 DIAGNOSIS — F329 Major depressive disorder, single episode, unspecified: Secondary | ICD-10-CM

## 2019-03-22 DIAGNOSIS — R69 Illness, unspecified: Secondary | ICD-10-CM | POA: Diagnosis not present

## 2019-03-22 DIAGNOSIS — F419 Anxiety disorder, unspecified: Secondary | ICD-10-CM | POA: Diagnosis not present

## 2019-03-22 DIAGNOSIS — G459 Transient cerebral ischemic attack, unspecified: Secondary | ICD-10-CM

## 2019-03-22 LAB — ECHOCARDIOGRAM COMPLETE BUBBLE STUDY
Height: 63.5 in
Weight: 2236.35 oz

## 2019-03-22 LAB — LIPID PANEL
Cholesterol: 216 mg/dL — ABNORMAL HIGH (ref 0–200)
HDL: 65 mg/dL (ref >40)
LDL Cholesterol: 136 mg/dL — ABNORMAL HIGH (ref 0–99)
Total CHOL/HDL Ratio: 3.3 RATIO
Triglycerides: 73 mg/dL (ref <150)
VLDL: 15 mg/dL (ref 0–40)

## 2019-03-22 LAB — CREATININE, SERUM
Creatinine, Ser: 0.64 mg/dL (ref 0.44–1.00)
GFR calc non Af Amer: >60 mL/min (ref >60)

## 2019-03-22 LAB — CBC
HCT: 37.5 % (ref 36.0–46.0)
Hemoglobin: 12.6 g/dL (ref 12.0–15.0)
MCH: 30.1 pg (ref 26.0–34.0)
MCHC: 33.6 g/dL (ref 30.0–36.0)
MCV: 89.7 fL (ref 80.0–100.0)
NRBC: 0 % (ref 0.0–0.2)
Platelets: 259 10*3/uL (ref 150–400)
RBC: 4.18 MIL/uL (ref 3.87–5.11)
RDW: 13.2 % (ref 11.5–15.5)
WBC: 4.9 10*3/uL (ref 4.0–10.5)

## 2019-03-22 LAB — HIV ANTIBODY (ROUTINE TESTING W REFLEX): HIV Screen 4th Generation wRfx: NONREACTIVE

## 2019-03-22 LAB — HEMOGLOBIN A1C
HEMOGLOBIN A1C: 5.3 % (ref 4.8–5.6)
Mean Plasma Glucose: 105.41 mg/dL

## 2019-03-22 MED ORDER — ASPIRIN EC 81 MG PO TBEC: 81.0000 mg | DELAYED_RELEASE_TABLET | Freq: Every day | ORAL | Status: DC

## 2019-03-22 MED ORDER — ASPIRIN 81 MG PO TBEC: 81.0000 mg | DELAYED_RELEASE_TABLET | Freq: Every day | ORAL | 0 refills | Status: DC

## 2019-03-22 MED ORDER — ATORVASTATIN CALCIUM 20 MG PO TABS: 20.0000 mg | ORAL_TABLET | Freq: Every day | ORAL | 0 refills | Status: DC

## 2019-03-22 NOTE — Evaluation (Signed)
Occupational Therapy Evaluation and Discharge Patient Details Name: Dana Bryan MRN: [id number] DOB: [birth year] Today's Date: 03/22/2019    History of Present Illness Pt is a 68 year old woman with hx of HLD admitted with transient vision changes and word finding problems.    Clinical Impression   Pt is functioning independently. Vision and speech have returned to baseline. No further OT needs.    Follow Up Recommendations  No OT follow up    Equipment Recommendations  None recommended by OT    Recommendations for Other Services       Precautions / Restrictions Precautions Precautions: None      Mobility Bed Mobility Overal bed mobility: Independent                Transfers Overall transfer level: Independent                    Balance Overall balance assessment: No apparent balance deficits (not formally assessed)                                         ADL either performed or assessed with clinical judgement   ADL Overall ADL's : Independent                                             Vision Baseline Vision/History: No visual deficits Patient Visual Report: No change from baseline       Perception     Praxis      Pertinent Vitals/Pain Pain Assessment: No/denies pain     Hand Dominance Right   Extremity/Trunk Assessment Upper Extremity Assessment Upper Extremity Assessment: Overall WFL for tasks assessed   Lower Extremity Assessment Lower Extremity Assessment: Overall WFL for tasks assessed   Cervical / Trunk Assessment Cervical / Trunk Assessment: Normal   Communication Communication Communication: No difficulties   Cognition Arousal/Alertness: Awake/alert Behavior During Therapy: WFL for tasks assessed/performed Overall Cognitive Status: Within Functional Limits for tasks assessed                                     General Comments       Exercises     Shoulder  Instructions      Home Living Family/patient expects to be discharged to:: Private residence Living Arrangements: Spouse/significant other Available Help at Discharge: Family;Available PRN/intermittently Type of Home: House             Bathroom Shower/Tub: Walk-in Psychologist, prison and probation services: Standard     Home Equipment: None      Lives With: Spouse    Prior Functioning/Environment Level of Independence: Independent                 OT Problem List:        OT Treatment/Interventions:      OT Goals(Current goals can be found in the care plan section)    OT Frequency:     Barriers to D/C:            Co-evaluation              AM-PAC OT "6 Clicks" Daily Activity     Outcome Measure Help from another  person eating meals?: None Help from another person taking care of personal grooming?: None Help from another person toileting, which includes using toliet, bedpan, or urinal?: None Help from another person bathing (including washing, rinsing, drying)?: None Help from another person to put on and taking off regular upper body clothing?: None Help from another person to put on and taking off regular lower body clothing?: None 6 Click Score: 24   End of Session    Activity Tolerance: Patient tolerated treatment well Patient left: in bed;with call bell/phone within reach;with nursing/sitter in room  OT Visit Diagnosis: Low vision, both eyes (H54.2)                Time: 1036-1050 OT Time Calculation (min): 14 min Charges:  OT General Charges $OT Visit: 1 Visit OT Evaluation $OT Eval Low Complexity: 1 Low Nestor Lewandowsky, OTR/L Acute Rehabilitation Services Pager: [phone] Office: [phone]  Malka So 03/22/2019, 11:56 AM

## 2019-03-22 NOTE — Care Management Obs Status (Signed)
[facility] NOTIFICATION   Patient Details  Name: Dana Bryan MRN: [id number] Date of Birth: 1951/01/19   Medicare Observation Status Notification Given:  Yes    Carles Collet, RN 03/22/2019, 2:11 PM

## 2019-03-22 NOTE — Consult Note (Signed)
Neurology Consultation Reason for Consult: Transient visual change Referring Physician: Tegeler, C  CC: Transient visual change  History is obtained from: Patient  HPI: Dana Bryan is a 68 y.o. female with a history of hyperlipidemia who presents with transient visual change that started abruptly at 12:15 PM.  She states that she was watching TV when abruptly half of the woman's face disappeared.  She looked around the room, and tried covering each eye sequentially to see if it was only one eye or both and the visual deficit was present in both eyes.  There were no positive symptoms.  She denies any numbness or weakness or other associated symptoms.  This resolved and she felt normal until later in the day when she was talking on the phone and someone asked her about her granddaughter "sadie."  She states that she knew who her granddaughter was, but could not remember her name.  She also was unable to associate other names that the person was saying with actual people, though could figure out from contact who she met.  This also lasted approximately 10 minutes.  Again no numbness or weakness or other findings associated with it.  She did not have trouble understanding the other aspects of the person speech.  She does not have a history of migraines, though she states she did have a single migraine years and years ago.  ROS: A 14 point ROS was performed and is negative except as noted in the HPI.  Past Medical History:  Diagnosis Date  . Hyperlipidemia   . Reactive depression (situational)   . Vasomotor rhinitis      Family History  Problem Relation Age of Onset  . Alcohol abuse Father   . Hypertension Mother      Social History:  reports that she quit smoking about 15 years ago. Her smoking use included cigarettes. She has never used smokeless tobacco. No history on file for alcohol and drug.   Exam: Current vital signs: BP (!) 156/90 (BP Location: Right Arm)   Pulse 83   Temp  98 F (36.7 C) (Oral)   Resp 18   Ht 5' 3.5" (1.613 m)   Wt 63.4 kg   SpO2 99%   BMI 24.37 kg/m  Vital signs in last 24 hours: Temp:  [97.9 F (36.6 C)-98 F (36.7 C)] 98 F (36.7 C) (03/31 2342) Pulse Rate:  [71-91] 83 (03/31 2342) Resp:  [15-21] 18 (03/31 2342) BP: (142-184)/(87-92) 156/90 (03/31 2342) SpO2:  [96 %-100 %] 99 % (03/31 2342) Weight:  [61.2 kg-63.4 kg] 63.4 kg (03/31 2342)   Physical Exam  Constitutional: Appears well-developed and well-nourished.  Psych: Affect appropriate to situation Eyes: No scleral injection HENT: No OP obstrucion Head: Normocephalic.  Cardiovascular: Normal rate and regular rhythm.  Respiratory: Effort normal, non-labored breathing GI: Soft.  No distension. There is no tenderness.  Skin: WDI  Neuro: Mental Status: Patient is awake, alert, oriented to person, place, month, year, and situation. Patient is able to give a clear and coherent history. No signs of aphasia or neglect Cranial Nerves: II: Visual Fields are full. Pupils are equal, round, and reactive to light.   III,IV, VI: EOMI without ptosis or diploplia.  V: Facial sensation is symmetric to temperature VII: Facial movement is symmetric.  VIII: hearing is intact to voice X: Uvula elevates symmetrically XI: Shoulder shrug is symmetric. XII: tongue is midline without atrophy or fasciculations.  Motor: Tone is normal. Bulk is normal. 5/5 strength was present  in all four extremities.  Sensory: Sensation is symmetric to light touch and temperature in the arms and legs. Cerebellar: FNF and HKS are intact bilaterally   I have reviewed labs in epic and the results pertinent to this consultation are: CMP-unremarkable  I have reviewed the images obtained: MRI brain-unremarkable, CTA head and neck-normal  Impression: 68 year old female with 2 episodes of transient symptoms, one of hemianopia and one of what sounds like possibly a mild anomic aphasia.  She is being admitted  for TIA evaluation.  Recommendations: - HgbA1c, fasting lipid panel - Frequent neuro checks - Echocardiogram - Prophylactic therapy-Antiplatelet med: Aspirin - dose '325mg'$  PO or '300mg'$  PR - Risk factor modification - Telemetry monitoring - PT consult, OT consult, Speech consult - Stroke team to follow  Roland Rack, MD [facility] [phone]  If 7pm- 7am, please page neurology on call as listed in [place]. `

## 2019-03-22 NOTE — Evaluation (Signed)
Physical Therapy Evaluation Patient Details Name: Dana Bryan MRN: [id number] DOB: 05-30-1951 Today's Date: 03/22/2019   History of Present Illness  Pt is a 68 year old woman with hx of HLD admitted with transient vision changes and word finding problems. MRI of the brain was negative for any acute findings.    Clinical Impression  Pt presented supine in bed with HOB elevated, awake and willing to participate in therapy session. Prior to admission, pt reported that she was independent with all functional mobility and ADLs. Pt lives with her husband in a single level home with four steps to enter. At time of evaluation, pt at supervision level to independent with all functional mobility including stair negotiation. Pt participated in a higher level balance assessment and scored a 24/24 on the DGI, indicating that she is a safe community ambulator. No further acute PT needs identified at this time. PT signing off.     Follow Up Recommendations No PT follow up    Equipment Recommendations  None recommended by PT    Recommendations for Other Services       Precautions / Restrictions Precautions Precautions: None Restrictions Weight Bearing Restrictions: No      Mobility  Bed Mobility Overal bed mobility: Independent                Transfers Overall transfer level: Independent                  Ambulation/Gait Ambulation/Gait assistance: Supervision Gait Distance (Feet): 200 Feet Assistive device: None Gait Pattern/deviations: Step-through pattern Gait velocity: able to fluctuate   General Gait Details: no instability or LOB, no need for physical assistance, supervision for safety  Stairs Stairs: Yes Stairs assistance: Supervision Stair Management: No rails;Alternating pattern;Forwards Number of Stairs: 3    Wheelchair Mobility    Modified Rankin (Stroke Patients Only)       Balance Overall balance assessment: Independent                                Standardized Balance Assessment Standardized Balance Assessment : Dynamic Gait Index   Dynamic Gait Index Level Surface: Normal Change in Gait Speed: Normal Gait with Horizontal Head Turns: Normal Gait with Vertical Head Turns: Normal Gait and Pivot Turn: Normal Step Over Obstacle: Normal Step Around Obstacles: Normal Steps: Normal Total Score: 24       Pertinent Vitals/Pain Pain Assessment: No/denies pain    Home Living Family/patient expects to be discharged to:: Private residence Living Arrangements: Spouse/significant other Available Help at Discharge: Family;Available PRN/intermittently Type of Home: House Home Access: Stairs to enter Entrance Stairs-Rails: Psychiatric nurse of Steps: 4 Home Layout: One level Home Equipment: None      Prior Function Level of Independence: Independent               Hand Dominance        Extremity/Trunk Assessment   Upper Extremity Assessment Upper Extremity Assessment: Overall WFL for tasks assessed    Lower Extremity Assessment Lower Extremity Assessment: Overall WFL for tasks assessed    Cervical / Trunk Assessment Cervical / Trunk Assessment: Normal  Communication   Communication: No difficulties  Cognition Arousal/Alertness: Awake/alert Behavior During Therapy: WFL for tasks assessed/performed Overall Cognitive Status: Within Functional Limits for tasks assessed  General Comments      Exercises     Assessment/Plan    PT Assessment Patent does not need any further PT services  PT Problem List         PT Treatment Interventions      PT Goals (Current goals can be found in the Care Plan section)  Acute Rehab PT Goals Patient Stated Goal: "to go home today" PT Goal Formulation: All assessment and education complete, DC therapy    Frequency     Barriers to discharge        Co-evaluation                AM-PAC PT "6 Clicks" Mobility  Outcome Measure Help needed turning from your back to your side while in a flat bed without using bedrails?: None Help needed moving from lying on your back to sitting on the side of a flat bed without using bedrails?: None Help needed moving to and from a bed to a chair (including a wheelchair)?: None Help needed standing up from a chair using your arms (e.g., wheelchair or bedside chair)?: None Help needed to walk in hospital room?: None Help needed climbing 3-5 steps with a railing? : None 6 Click Score: 24    End of Session   Activity Tolerance: Patient tolerated treatment well Patient left: in bed;with call bell/phone within reach Nurse Communication: Mobility status PT Visit Diagnosis: Other abnormalities of gait and mobility (R26.89)    Time: 9758-8325 PT Time Calculation (min) (ACUTE ONLY): 16 min   Charges:   PT Evaluation $PT Eval Low Complexity: [place], PT, DPT  Acute Rehabilitation Services Pager [phone] Office [place] 03/22/2019, 3:28 PM

## 2019-03-22 NOTE — TOC Initial Note (Signed)
Transition of Care [facility]) - Initial/Assessment Note    Patient Details  Name: Dana Bryan MRN: [id number] Date of Birth: [birth year]  [facility]) CM/SW Contact:    Pollie Friar, RN Phone Number: 03/22/2019, 2:33 PM  Clinical Narrative:                   Expected Discharge Plan: Home/Self Care Barriers to Discharge: Continued Medical Work up   Patient Goals and CMS Choice        Expected Discharge Plan and Services Expected Discharge Plan: Home/Self Care       Living arrangements for the past 2 months: Single Family Home(one level)                          Prior Living Arrangements/Services Living arrangements for the past 2 months: Single Family Home(one level) Lives with:: Spouse Patient language and need for interpreter reviewed:: Yes(no needs) Do you feel safe going back to the place where you live?: Yes      Need for Family Participation in Patient Care: No (Comment) Care giver support system in place?: Yes (comment)(spouse can provide 24 hour supervision)   Criminal Activity/Legal Involvement Pertinent to Current Situation/Hospitalization: No - Comment as needed  Activities of Daily Living Home Assistive Devices/Equipment: Eyeglasses ADL Screening (condition at time of admission) Patient's cognitive ability adequate to safely complete daily activities?: Yes Is the patient deaf or have difficulty hearing?: No Does the patient have difficulty seeing, even when wearing glasses/contacts?: No Does the patient have difficulty concentrating, remembering, or making decisions?: No Patient able to express need for assistance with ADLs?: Yes Does the patient have difficulty dressing or bathing?: No Independently performs ADLs?: Yes (appropriate for developmental age) Does the patient have difficulty walking or climbing stairs?: No Weakness of Legs: None Weakness of Arms/Hands: None  Permission Sought/Granted                  Emotional  Assessment Appearance:: Appears stated age Attitude/Demeanor/Rapport: Engaged Affect (typically observed): Accepting, Pleasant, Appropriate Orientation: : Oriented to Self, Oriented to Place, Oriented to  Time, Oriented to Situation   Psych Involvement: No (comment)  Admission diagnosis:  TIA (transient ischemic attack) [G45.9] Transient memory loss [R41.3] Transient vision disturbance, right [H53.9] Patient Active Problem List   Diagnosis Date Noted  . TIA (transient ischemic attack) 03/21/2019  . Alcohol abuse, daily use 01/06/2019  . Routine health maintenance 08/06/2011  . VITAMIN B12 DEFICIENCY 07/18/2008  . Hyperlipidemia 02/15/2008  . Anxiety and depression 02/15/2008   PCP:  Midge Minium, MD Pharmacy:   [place], [place] [address] [place] [address] [place] [address] Phone: [phone] Fax: [place] [address] [place]), [place] - [address] [place] [address] Phone: [phone] Fax: [phone]  [id number] [place] [address] [place] [address] Phone: [phone] Fax: [phone]  [facility] [place], [place] [address] [place] [address] Phone: [phone] Fax: [phone]     Social Determinants of Health (SDOH) Interventions  Pt drives self.  Pt denies issues with obtaining home meds and with taking them.  Readmission Risk Interventions No flowsheet data found.

## 2019-03-22 NOTE — Plan of Care (Signed)
Adequate for discharge.

## 2019-03-22 NOTE — Progress Notes (Signed)
Pt. W/ discharge orders, dc instructions given , pt. Verbalized understanding. Pt. Discharged home via wheelchair.

## 2019-03-22 NOTE — Discharge Summary (Signed)
Physician Discharge Summary  Dana Bryan UXN:[id number] DOB: September 09, 1951 DOA: 03/21/2019  PCP: Midge Minium, MD  Admit date: 03/21/2019 Discharge date: 03/22/2019  Time spent: 45 minutes  Recommendations for Outpatient Follow-up:  Patient will be discharged to home.  Patient will need to follow up with primary care provider within one week of discharge, discussed blood pressure as well as cholesterol.  Follow-up with neurology, Dr. Leonie Man. Patient should continue medications as prescribed.  Patient should follow a healthy diet.    Discharge Diagnoses:  Principal Problem: Complicated migraine Hyperlipidemia Essential hypertension Depression/anxiety  Discharge Condition: Stable  Diet recommendation: Heart healthy  Filed Weights   03/21/19 1846 03/21/19 2342  Weight: 61.2 kg 63.4 kg    History of present illness:  On 03/21/2019 by Dr. Cheri Fowler is a 68 y.o. female with medical history significant of hyperlipidemia, depression, history of rhinitis, who presented with sudden onset of cough, forgetfulness and visual changes.  Patient has had upper respite tract infection for about 2 months on and off.  She has been having residual cough from that.  She called her primary care physician due to transient changes in her vision and memory loss.  This happened about noon today and lasted about 10 to 15 minutes.  She was watching the news when she noted the right side of everyone's face and the right half of the television was completely dark.  And this is both eyes.  Symptoms have since resolved.  She spoke with her friend who noted that she was confused and forgetful of names of people including her granddaughter.  And that lasted a while.  After calling her PCP she was asked to come to the ER.  In the ER a code stroke was called for.  Symptoms have since resolved.  Neurology consulted over the phone and recommended TIA work-up.  She is being transferred to [facility]  for TIA work-up.  Hospital Course:  Complicated migraine -Initially thought to be TIA -Symptoms have now resolved -MRI brain negative motion degraded noncontrast MRI -CTA head and neck negative -LDL 136, hemoglobin A1c 5.3 -Echocardiogram EF 55-60%. LV diastolic doppler parameters- impaired relaxation. Negative bubble study, no ASD or PFO. -Neurology consulted and appreciated.  Recommended aspirin and statin upon discharge with outpatient follow-up  Hyperlipidemia -Discussion with patient regarding her hyperlipidemia.  She has had high cholesterol since 2012 and has not wanted to be on a statin medication.  Patient has been taking red yeast rice. -Will discharge patient with Lipitor 20 mg daily -Patient will need to have repeat LFTs and lipid panel within 3 months  Essential hypertension -On no home medications, should follow-up with her PCP regarding possible need for medications  Depression/anxiety -Continue home regimen  Procedures: Echocardiogram  Consultations: Neurology  Discharge Exam: Vitals:   03/22/19 1238 03/22/19 1612  BP: (!) 167/80 (!) 174/107  Pulse: 80 87  Resp: 18 18  Temp: 97.7 F (36.5 C) 98.6 F (37 C)  SpO2: 100% 99%     General: Well developed, well nourished, NAD, appears stated age  HEENT: NCAT, PERRLA, EOMI, Anicteic Sclera, mucous membranes moist.  Neck: Supple, no JVD, no masses  Cardiovascular: S1 S2 auscultated, no rubs, murmurs or gallops. Regular rate and rhythm.  Respiratory: Clear to auscultation bilaterally with equal chest rise  Abdomen: Soft, nontender, nondistended, + bowel sounds  Extremities: warm dry without cyanosis clubbing or edema  Neuro: AAOx3, cranial nerves grossly intact. Strength 5/5 in patient's upper and lower  extremities bilaterally  Skin: Without rashes exudates or nodules  Psych: Normal affect and demeanor with intact judgement and insight  Discharge Instructions Discharge Instructions    Discharge  instructions   Complete by:  As directed    Patient will be discharged to home.  Patient will need to follow up with primary care provider within one week of discharge, discussed blood pressure as well as cholesterol.  Follow-up with neurology, Dr. Leonie Man. Patient should continue medications as prescribed.  Patient should follow a healthy diet.     Allergies as of 03/22/2019   No Known Allergies     Medication List    TAKE these medications   ALPRAZolam 0.25 MG tablet Commonly known as:  XANAX Take 1 tablet (0.25 mg total) by mouth daily as needed. What changed:    how much to take  reasons to take this   aspirin 81 MG EC tablet Take 1 tablet (81 mg total) by mouth daily. Start taking on:  March 23, 2019   atorvastatin 20 MG tablet Commonly known as:  LIPITOR Take 1 tablet (20 mg total) by mouth daily at 6 PM for 30 days.   buPROPion 150 MG 24 hr tablet Commonly known as:  WELLBUTRIN XL Take 1 tablet (150 mg total) by mouth daily.   ibuprofen 200 MG tablet Commonly known as:  ADVIL,MOTRIN Take 400 mg by mouth daily as needed for headache.   loratadine 10 MG tablet Commonly known as:  CLARITIN Take 10 mg by mouth daily.   RED YEAST RICE PO Take 1,200 mg by mouth daily.      No Known Allergies Follow-up Information    Midge Minium, MD. Schedule an appointment as soon as possible for a visit in 1 week(s).   Specialty:  Family Medicine Why:  Hospital follow up Contact information: [address] [place] [address] [phone]        Garvin Fila, MD. Schedule an appointment as soon as possible for a visit in 4 week(s).   Specialties:  Neurology, Radiology Why:  Complicated migraine  Contact information: [address] [place] [address] [phone]            The results of significant diagnostics from this hospitalization (including imaging, microbiology, ancillary and laboratory) are listed below for reference.     Significant Diagnostic Studies: Ct Angio Head W Or Wo Contrast  Result Date: 03/21/2019 CLINICAL DATA:  Transient vision and memory loss.  Assess TIA. EXAM: CT ANGIOGRAPHY HEAD AND NECK TECHNIQUE: Multidetector CT imaging of the head and neck was performed using the standard protocol during bolus administration of intravenous contrast. Multiplanar CT image reconstructions and MIPs were obtained to evaluate the vascular anatomy. Carotid stenosis measurements (when applicable) are obtained utilizing NASCET criteria, using the distal internal carotid diameter as the denominator. CONTRAST:  16mL OMNIPAQUE IOHEXOL 350 MG/ML SOLN COMPARISON:  None. FINDINGS: CT HEAD FINDINGS BRAIN: No intraparenchymal hemorrhage, mass effect nor midline shift. No parenchymal brain volume loss for age. No hydrocephalus. No acute large vascular territory infarcts. No abnormal extra-axial fluid collections. Basal cisterns are patent. VASCULAR: Mild calcific atherosclerosis of the carotid siphons. SKULL: No skull fracture. Subcentimeter parietal calvarial probable hemangioma. No significant scalp soft tissue swelling. SINUSES/ORBITS: Paranasal sinuses are well aerated. Mastoid air cells are well aerated.The included ocular globes and orbital contents are non-suspicious. OTHER: None. CTA NECK FINDINGS: AORTIC ARCH: Normal appearance of the thoracic arch, 2 vessel arch is a normal variant.  Mild calcific atherosclerosis aortic arch. The origins of the innominate, left Common carotid artery and subclavian artery are patent. RIGHT CAROTID SYSTEM: Common carotid artery is patent. Normal appearance of the carotid bifurcation without hemodynamically significant stenosis by NASCET criteria. Normal appearance of the internal carotid artery. LEFT CAROTID SYSTEM: Common carotid artery is patent. Mild calcific atherosclerosis of the carotid bifurcation without hemodynamically significant stenosis by NASCET criteria. Normal appearance of the  internal carotid artery. VERTEBRAL ARTERIES:RIGHT vertebral artery is dominant. Patent bilateral vertebral arteries, beam hardening artifact through LEFT vertebral artery without flow-limiting stenosis. SKELETON: No acute osseous process though bone windows have not been submitted. OTHER NECK: Soft tissues of the neck are nonacute though, not tailored for evaluation. Cervical spondylosis resulting in severe C5-6 neural foraminal narrowing. UPPER CHEST: Biapical pleuroparenchymal scarring clear. No superior mediastinal lymphadenopathy. CTA HEAD FINDINGS: ANTERIOR CIRCULATION: Patent cervical internal carotid arteries, petrous, cavernous and supra clinoid internal carotid arteries. Patent anterior communicating artery. Patent anterior and middle cerebral arteries. No large vessel occlusion, flow-limiting stenosis, contrast extravasation or aneurysm. POSTERIOR CIRCULATION: Patent vertebral arteries, vertebrobasilar junction and basilar artery, as well as main branch vessels. Patent posterior cerebral arteries. Robust bilateral posterior communicating arteries present. No large vessel occlusion, flow-limiting stenosis, contrast extravasation or aneurysm. VENOUS SINUSES: Major dural venous sinuses are patent though not tailored for evaluation on this angiographic examination. ANATOMIC VARIANTS: None. DELAYED PHASE: Not performed. MIP images reviewed. IMPRESSION: 1. Negative CT HEAD with and without contrast for age. 2. Negative CTA HEAD and CTA NECK. Aortic Atherosclerosis (ICD10-I70.0). Electronically Signed   By: Elon Alas M.D.   On: 03/21/2019 21:49   Ct Angio Neck W Or Wo Contrast  Result Date: 03/21/2019 CLINICAL DATA:  Transient vision and memory loss.  Assess TIA. EXAM: CT ANGIOGRAPHY HEAD AND NECK TECHNIQUE: Multidetector CT imaging of the head and neck was performed using the standard protocol during bolus administration of intravenous contrast. Multiplanar CT image reconstructions and MIPs were  obtained to evaluate the vascular anatomy. Carotid stenosis measurements (when applicable) are obtained utilizing NASCET criteria, using the distal internal carotid diameter as the denominator. CONTRAST:  66mL OMNIPAQUE IOHEXOL 350 MG/ML SOLN COMPARISON:  None. FINDINGS: CT HEAD FINDINGS BRAIN: No intraparenchymal hemorrhage, mass effect nor midline shift. No parenchymal brain volume loss for age. No hydrocephalus. No acute large vascular territory infarcts. No abnormal extra-axial fluid collections. Basal cisterns are patent. VASCULAR: Mild calcific atherosclerosis of the carotid siphons. SKULL: No skull fracture. Subcentimeter parietal calvarial probable hemangioma. No significant scalp soft tissue swelling. SINUSES/ORBITS: Paranasal sinuses are well aerated. Mastoid air cells are well aerated.The included ocular globes and orbital contents are non-suspicious. OTHER: None. CTA NECK FINDINGS: AORTIC ARCH: Normal appearance of the thoracic arch, 2 vessel arch is a normal variant. Mild calcific atherosclerosis aortic arch. The origins of the innominate, left Common carotid artery and subclavian artery are patent. RIGHT CAROTID SYSTEM: Common carotid artery is patent. Normal appearance of the carotid bifurcation without hemodynamically significant stenosis by NASCET criteria. Normal appearance of the internal carotid artery. LEFT CAROTID SYSTEM: Common carotid artery is patent. Mild calcific atherosclerosis of the carotid bifurcation without hemodynamically significant stenosis by NASCET criteria. Normal appearance of the internal carotid artery. VERTEBRAL ARTERIES:RIGHT vertebral artery is dominant. Patent bilateral vertebral arteries, beam hardening artifact through LEFT vertebral artery without flow-limiting stenosis. SKELETON: No acute osseous process though bone windows have not been submitted. OTHER NECK: Soft tissues of the neck are nonacute though, not tailored for evaluation. Cervical spondylosis resulting in  severe C5-6 neural foraminal narrowing. UPPER CHEST: Biapical pleuroparenchymal scarring clear. No superior mediastinal lymphadenopathy. CTA HEAD FINDINGS: ANTERIOR CIRCULATION: Patent cervical internal carotid arteries, petrous, cavernous and supra clinoid internal carotid arteries. Patent anterior communicating artery. Patent anterior and middle cerebral arteries. No large vessel occlusion, flow-limiting stenosis, contrast extravasation or aneurysm. POSTERIOR CIRCULATION: Patent vertebral arteries, vertebrobasilar junction and basilar artery, as well as main branch vessels. Patent posterior cerebral arteries. Robust bilateral posterior communicating arteries present. No large vessel occlusion, flow-limiting stenosis, contrast extravasation or aneurysm. VENOUS SINUSES: Major dural venous sinuses are patent though not tailored for evaluation on this angiographic examination. ANATOMIC VARIANTS: None. DELAYED PHASE: Not performed. MIP images reviewed. IMPRESSION: 1. Negative CT HEAD with and without contrast for age. 2. Negative CTA HEAD and CTA NECK. Aortic Atherosclerosis (ICD10-I70.0). Electronically Signed   By: Elon Alas M.D.   On: 03/21/2019 21:49   Mr Brain Wo Contrast  Result Date: 03/21/2019 CLINICAL DATA:  Transient vision and memory loss today. History of hypertension, hyperlipidemia. EXAM: MRI HEAD WITHOUT CONTRAST TECHNIQUE: Multiplanar, multiecho pulse sequences of the brain and surrounding structures were obtained without intravenous contrast. COMPARISON:  CT HEAD March 21, 2019 FINDINGS: Mild motion degraded examination. INTRACRANIAL CONTENTS: No reduced diffusion to suggest acute ischemia. No susceptibility artifact to suggest hemorrhage. No parenchymal brain volume loss for age. No hydrocephalus. A few scattered subcentimeter supratentorial white matter FLAIR T2 hyperintensities seen with chronic small vessel ischemic changes, less than expected for age. No suspicious parenchymal signal,  masses, mass effect. No abnormal extra-axial fluid collections. No extra-axial masses. VASCULAR: Normal major intracranial vascular flow voids present at skull base. SKULL AND UPPER CERVICAL SPINE: No abnormal sellar expansion. No suspicious calvarial bone marrow signal. Craniocervical junction maintained. SINUSES/ORBITS: The mastoid air-cells and included paranasal sinuses are well-aerated.The included ocular globes and orbital contents are non-suspicious. OTHER: None. IMPRESSION: 1. Negative motion degraded noncontrast MRI head for age. Electronically Signed   By: Elon Alas M.D.   On: 03/21/2019 22:33    Microbiology: No results found for this or any previous visit (from the past 240 hour(s)).   Labs: Basic Metabolic Panel: Recent Labs  Lab 03/21/19 2011 03/21/19 2039 03/22/19 0533  NA 136  --   --   K 3.9  --   --   CL 99  --   --   CO2 24  --   --   GLUCOSE 100*  --   --   BUN 12  --   --   CREATININE 0.67 0.60 0.64  CALCIUM 9.4  --   --    Liver Function Tests: Recent Labs  Lab 03/21/19 2011  AST 16  ALT 17  ALKPHOS 140*  BILITOT 0.7  PROT 8.7*  ALBUMIN 4.8   No results for input(s): LIPASE, AMYLASE in the last 168 hours. No results for input(s): AMMONIA in the last 168 hours. CBC: Recent Labs  Lab 03/21/19 2011 03/22/19 0533  WBC 7.2 4.9  NEUTROABS 5.1  --   HGB 14.6 12.6  HCT 44.1 37.5  MCV 91.9 89.7  PLT 301 259   Cardiac Enzymes: No results for input(s): CKTOTAL, CKMB, CKMBINDEX, TROPONINI in the last 168 hours. BNP: BNP (last 3 results) No results for input(s): BNP in the last 8760 hours.  ProBNP (last 3 results) No results for input(s): PROBNP in the last 8760 hours.  CBG: Recent Labs  Lab 03/21/19 1843  GLUCAP 85       Signed:  Cristal Ford  [facility] 03/22/2019, 5:50 PM

## 2019-03-22 NOTE — Evaluation (Signed)
Speech Language Pathology Evaluation Patient Details Name: Dana Bryan MRN: [id number] DOB: Jul 31, 1951 Today's Date: 03/22/2019 Time: 57-     Problem List:  Patient Active Problem List   Diagnosis Date Noted  . TIA (transient ischemic attack) 03/21/2019  . Alcohol abuse, daily use 01/06/2019  . Routine health maintenance 08/06/2011  . VITAMIN B12 DEFICIENCY 07/18/2008  . Hyperlipidemia 02/15/2008  . Anxiety and depression 02/15/2008   Past Medical History:  Past Medical History:  Diagnosis Date  . Hyperlipidemia   . Reactive depression (situational)   . Vasomotor rhinitis    Past Surgical History:  Past Surgical History:  Procedure Laterality Date  . TONSILLECTOMY     HPI:  Pt is a 68 y.o. female with medical history significant of hyperlipidemia, depression, history of rhinitis, who presented with sudden onset of cough, forgetfulness and visual changes. She has had upper respite tract infection for about 2 months on and off, and has been having residual cough from that. MRI was negative for acute changes.    Assessment / Plan / Recommendation Clinical Impression  Pt participated in speech/language/cognition evaluation. She reported that her memory was not "great" prior to admission and that she did "fail" a cognitive assessment in October/November, 2019 which was administered by a home health doctor and she was told that she had "mild cognitive impairment". Pt did express that she was flustered and anxious when that assessment was completed but she believes her memory has returned to baseline. She denied any changes in speech or language. [facility] Cognitive Assessment 8.1 was completed to evaluate the pt's cognitive-linguistic skills. She achieved a score of 29/30 which is within the normal limits of 26 or more out of 30 and no speech/language deficits were demonstrated. Further skilled SLP services are not clinically indicated at this time. Pt and nursing were educated  regarding this and both parties verbalized understanding as well as agreement with plan of care.    SLP Assessment  SLP Recommendation/Assessment: Patient does not need any further Speech Lanaguage Pathology Services SLP Visit Diagnosis: Cognitive communication deficit (R41.841)    Follow Up Recommendations  None    Frequency and Duration           SLP Evaluation Cognition  Overall Cognitive Status: Within Functional Limits for tasks assessed Arousal/Alertness: Awake/alert Orientation Level: Oriented X4 Attention: Focused;Sustained Focused Attention: Appears intact(Vigilance WNL: 1/1) Sustained Attention: Appears intact(Serial 7s: 3/3) Awareness: Appears intact Problem Solving: Appears intact Executive Function: Reasoning;Sequencing Reasoning: Appears intact(Abstraction: 2/2) Sequencing: Appears intact(Clock drawing: 3/3) Safety/Judgment: Appears intact       Comprehension  Auditory Comprehension Overall Auditory Comprehension: Appears within functional limits for tasks assessed Yes/No Questions: Within Functional Limits Commands: Within Functional Limits(Complex commands: trail: 1/1) Conversation: Diplomatic Services operational officer Discrimination: Within Function Limits Reading Comprehension Reading Status: Within funtional limits    Expression Expression Primary Mode of Expression: Verbal Verbal Expression Overall Verbal Expression: Appears within functional limits for tasks assessed Initiation: No impairment Level of Generative/Spontaneous Verbalization: Sentence;Conversation Repetition: No impairment(Sentence completion; 2/2) Naming: No impairment(Confrontational: 3/3; Divergent: 1/1) Pragmatics: No impairment Written Expression Dominant Hand: Right Written Expression: (Copying cube: 1/1)   Oral / Motor  Oral Motor/Sensory Function Overall Oral Motor/Sensory Function: Within functional limits Motor Speech Respiration: Within functional  limits Phonation: Normal Resonance: Within functional limits Articulation: Within functional limitis Intelligibility: Intelligible Motor Planning: Witnin functional limits   Jemuel Laursen I. Hardin Negus, [place], [place] Office number [phone] Pager [phone]  Horton Marshall 03/22/2019, 11:55 AM

## 2019-03-22 NOTE — Progress Notes (Signed)
  Echocardiogram 2D Echocardiogram has been performed.  Jennette Dubin 03/22/2019, 9:38 AM

## 2019-03-22 NOTE — Progress Notes (Signed)
STROKE TEAM PROGRESS NOTE   INTERVAL HISTORY Patient states she had initially episode of transient right hemifield vision disturbance followed by headache and she was also little bit confused and had trouble remembering her favorite granddaughter`s name  as well as other people. This cleared in about an hour or so. She does have a prior history of rare headaches which may have been migraine. She has no symptoms at present and no complaints.  Vitals:   03/22/19 0200 03/22/19 0400 03/22/19 0600 03/22/19 0800  BP: (!) 110/55 (!) 101/54 (!) 152/77 135/85  Pulse: 67 67 64 74  Resp: 18 18 18 18   Temp: 97.7 F (36.5 C) 97.9 F (36.6 C) 97.6 F (36.4 C) 97.8 F (36.6 C)  TempSrc: Oral Oral Oral Oral  SpO2: 97% 100% 99% 100%  Weight:      Height:        CBC:  Recent Labs  Lab 03/21/19 2011 03/22/19 0533  WBC 7.2 4.9  NEUTROABS 5.1  --   HGB 14.6 12.6  HCT 44.1 37.5  MCV 91.9 89.7  PLT 301 892    Basic Metabolic Panel:  Recent Labs  Lab 03/21/19 2011 03/21/19 2039 03/22/19 0533  NA 136  --   --   K 3.9  --   --   CL 99  --   --   CO2 24  --   --   GLUCOSE 100*  --   --   BUN 12  --   --   CREATININE 0.67 0.60 0.64  CALCIUM 9.4  --   --    Lipid Panel:     Component Value Date/Time   CHOL 216 (H) 03/22/2019 0533   TRIG 73 03/22/2019 0533   TRIG 79 12/16/2006 0919   HDL 65 03/22/2019 0533   CHOLHDL 3.3 03/22/2019 0533   VLDL 15 03/22/2019 0533   LDLCALC 136 (H) 03/22/2019 0533   HgbA1c:  Lab Results  Component Value Date   HGBA1C 5.3 03/22/2019   Urine Drug Screen:     Component Value Date/Time   LABOPIA NONE DETECTED 03/21/2019 2024   COCAINSCRNUR NONE DETECTED 03/21/2019 2024   LABBENZ NONE DETECTED 03/21/2019 2024   AMPHETMU NONE DETECTED 03/21/2019 2024   THCU NONE DETECTED 03/21/2019 2024   LABBARB NONE DETECTED 03/21/2019 2024    Alcohol Level     Component Value Date/Time   ETH <10 03/21/2019 2011    IMAGING Ct Angio Head W Or Wo  Contrast  Result Date: 03/21/2019 CLINICAL DATA:  Transient vision and memory loss.  Assess TIA. EXAM: CT ANGIOGRAPHY HEAD AND NECK TECHNIQUE: Multidetector CT imaging of the head and neck was performed using the standard protocol during bolus administration of intravenous contrast. Multiplanar CT image reconstructions and MIPs were obtained to evaluate the vascular anatomy. Carotid stenosis measurements (when applicable) are obtained utilizing NASCET criteria, using the distal internal carotid diameter as the denominator. CONTRAST:  57mL OMNIPAQUE IOHEXOL 350 MG/ML SOLN COMPARISON:  None. FINDINGS: CT HEAD FINDINGS BRAIN: No intraparenchymal hemorrhage, mass effect nor midline shift. No parenchymal brain volume loss for age. No hydrocephalus. No acute large vascular territory infarcts. No abnormal extra-axial fluid collections. Basal cisterns are patent. VASCULAR: Mild calcific atherosclerosis of the carotid siphons. SKULL: No skull fracture. Subcentimeter parietal calvarial probable hemangioma. No significant scalp soft tissue swelling. SINUSES/ORBITS: Paranasal sinuses are well aerated. Mastoid air cells are well aerated.The included ocular globes and orbital contents are non-suspicious. OTHER: None. CTA NECK FINDINGS: AORTIC ARCH:  Normal appearance of the thoracic arch, 2 vessel arch is a normal variant. Mild calcific atherosclerosis aortic arch. The origins of the innominate, left Common carotid artery and subclavian artery are patent. RIGHT CAROTID SYSTEM: Common carotid artery is patent. Normal appearance of the carotid bifurcation without hemodynamically significant stenosis by NASCET criteria. Normal appearance of the internal carotid artery. LEFT CAROTID SYSTEM: Common carotid artery is patent. Mild calcific atherosclerosis of the carotid bifurcation without hemodynamically significant stenosis by NASCET criteria. Normal appearance of the internal carotid artery. VERTEBRAL ARTERIES:RIGHT vertebral artery  is dominant. Patent bilateral vertebral arteries, beam hardening artifact through LEFT vertebral artery without flow-limiting stenosis. SKELETON: No acute osseous process though bone windows have not been submitted. OTHER NECK: Soft tissues of the neck are nonacute though, not tailored for evaluation. Cervical spondylosis resulting in severe C5-6 neural foraminal narrowing. UPPER CHEST: Biapical pleuroparenchymal scarring clear. No superior mediastinal lymphadenopathy. CTA HEAD FINDINGS: ANTERIOR CIRCULATION: Patent cervical internal carotid arteries, petrous, cavernous and supra clinoid internal carotid arteries. Patent anterior communicating artery. Patent anterior and middle cerebral arteries. No large vessel occlusion, flow-limiting stenosis, contrast extravasation or aneurysm. POSTERIOR CIRCULATION: Patent vertebral arteries, vertebrobasilar junction and basilar artery, as well as main branch vessels. Patent posterior cerebral arteries. Robust bilateral posterior communicating arteries present. No large vessel occlusion, flow-limiting stenosis, contrast extravasation or aneurysm. VENOUS SINUSES: Major dural venous sinuses are patent though not tailored for evaluation on this angiographic examination. ANATOMIC VARIANTS: None. DELAYED PHASE: Not performed. MIP images reviewed. IMPRESSION: 1. Negative CT HEAD with and without contrast for age. 2. Negative CTA HEAD and CTA NECK. Aortic Atherosclerosis (ICD10-I70.0). Electronically Signed   By: Elon Alas M.D.   On: 03/21/2019 21:49   Ct Angio Neck W Or Wo Contrast  Result Date: 03/21/2019 CLINICAL DATA:  Transient vision and memory loss.  Assess TIA. EXAM: CT ANGIOGRAPHY HEAD AND NECK TECHNIQUE: Multidetector CT imaging of the head and neck was performed using the standard protocol during bolus administration of intravenous contrast. Multiplanar CT image reconstructions and MIPs were obtained to evaluate the vascular anatomy. Carotid stenosis  measurements (when applicable) are obtained utilizing NASCET criteria, using the distal internal carotid diameter as the denominator. CONTRAST:  63mL OMNIPAQUE IOHEXOL 350 MG/ML SOLN COMPARISON:  None. FINDINGS: CT HEAD FINDINGS BRAIN: No intraparenchymal hemorrhage, mass effect nor midline shift. No parenchymal brain volume loss for age. No hydrocephalus. No acute large vascular territory infarcts. No abnormal extra-axial fluid collections. Basal cisterns are patent. VASCULAR: Mild calcific atherosclerosis of the carotid siphons. SKULL: No skull fracture. Subcentimeter parietal calvarial probable hemangioma. No significant scalp soft tissue swelling. SINUSES/ORBITS: Paranasal sinuses are well aerated. Mastoid air cells are well aerated.The included ocular globes and orbital contents are non-suspicious. OTHER: None. CTA NECK FINDINGS: AORTIC ARCH: Normal appearance of the thoracic arch, 2 vessel arch is a normal variant. Mild calcific atherosclerosis aortic arch. The origins of the innominate, left Common carotid artery and subclavian artery are patent. RIGHT CAROTID SYSTEM: Common carotid artery is patent. Normal appearance of the carotid bifurcation without hemodynamically significant stenosis by NASCET criteria. Normal appearance of the internal carotid artery. LEFT CAROTID SYSTEM: Common carotid artery is patent. Mild calcific atherosclerosis of the carotid bifurcation without hemodynamically significant stenosis by NASCET criteria. Normal appearance of the internal carotid artery. VERTEBRAL ARTERIES:RIGHT vertebral artery is dominant. Patent bilateral vertebral arteries, beam hardening artifact through LEFT vertebral artery without flow-limiting stenosis. SKELETON: No acute osseous process though bone windows have not been submitted. OTHER NECK: Soft tissues of the  neck are nonacute though, not tailored for evaluation. Cervical spondylosis resulting in severe C5-6 neural foraminal narrowing. UPPER CHEST:  Biapical pleuroparenchymal scarring clear. No superior mediastinal lymphadenopathy. CTA HEAD FINDINGS: ANTERIOR CIRCULATION: Patent cervical internal carotid arteries, petrous, cavernous and supra clinoid internal carotid arteries. Patent anterior communicating artery. Patent anterior and middle cerebral arteries. No large vessel occlusion, flow-limiting stenosis, contrast extravasation or aneurysm. POSTERIOR CIRCULATION: Patent vertebral arteries, vertebrobasilar junction and basilar artery, as well as main branch vessels. Patent posterior cerebral arteries. Robust bilateral posterior communicating arteries present. No large vessel occlusion, flow-limiting stenosis, contrast extravasation or aneurysm. VENOUS SINUSES: Major dural venous sinuses are patent though not tailored for evaluation on this angiographic examination. ANATOMIC VARIANTS: None. DELAYED PHASE: Not performed. MIP images reviewed. IMPRESSION: 1. Negative CT HEAD with and without contrast for age. 2. Negative CTA HEAD and CTA NECK. Aortic Atherosclerosis (ICD10-I70.0). Electronically Signed   By: Elon Alas M.D.   On: 03/21/2019 21:49   Mr Brain Wo Contrast  Result Date: 03/21/2019 CLINICAL DATA:  Transient vision and memory loss today. History of hypertension, hyperlipidemia. EXAM: MRI HEAD WITHOUT CONTRAST TECHNIQUE: Multiplanar, multiecho pulse sequences of the brain and surrounding structures were obtained without intravenous contrast. COMPARISON:  CT HEAD March 21, 2019 FINDINGS: Mild motion degraded examination. INTRACRANIAL CONTENTS: No reduced diffusion to suggest acute ischemia. No susceptibility artifact to suggest hemorrhage. No parenchymal brain volume loss for age. No hydrocephalus. A few scattered subcentimeter supratentorial white matter FLAIR T2 hyperintensities seen with chronic small vessel ischemic changes, less than expected for age. No suspicious parenchymal signal, masses, mass effect. No abnormal extra-axial fluid  collections. No extra-axial masses. VASCULAR: Normal major intracranial vascular flow voids present at skull base. SKULL AND UPPER CERVICAL SPINE: No abnormal sellar expansion. No suspicious calvarial bone marrow signal. Craniocervical junction maintained. SINUSES/ORBITS: The mastoid air-cells and included paranasal sinuses are well-aerated.The included ocular globes and orbital contents are non-suspicious. OTHER: None. IMPRESSION: 1. Negative motion degraded noncontrast MRI head for age. Electronically Signed   By: Elon Alas M.D.   On: 03/21/2019 22:33    PHYSICAL EXAM Present middle-age Caucasian lady not in distress. . Afebrile. Head is nontraumatic. Neck is supple without bruit.    Cardiac exam no murmur or gallop. Lungs are clear to auscultation. Distal pulses are well felt. Neurological Exam ;  Awake  Alert oriented x 3. Normal speech and language.eye movements full without nystagmus.fundi were not visualized. Vision acuity and fields appear normal. Hearing is normal. Palatal movements are normal. Face symmetric. Tongue midline. Normal strength, tone, reflexes and coordination. Normal sensation. Gait deferred.  ASSESSMENT/PLAN [name] is a 68 y.o. female with history of HLD presenting with transient visual change and transient word finding difficulties.   Complicated migraine episode rather than left hemispheric TIA  CT head negative  CTA head & neck negative  MRI  negative  2D Echo pending  LDL 136  HgbA1c 5.3  Lovenox 40 mg sq daily for VTE prophylaxis  No antithrombotic prior to admission, now on aspirin  81mg  daily    Therapy recommendations:  None necessary as  no deficit  Disposition:  Return home  Hypertension  BP elevated on arrival  Stable now  Home meds: none . BP goal normotensive  Hyperlipidemia  Home meds:  Red rice yeast, new in Jan  Patient has refused meds in past, considering statin now  LDL 136, goal < 70  Recommend  addition of statin Lipitor 40 mg daily.Continue statin at discharge  Other Stroke Risk Factors  Advanced age  Former Cigarette smoker, quit 15 yrs ago  Hx ETOH abuse. Denies currently  Other [facility] day # 0  I have personally obtained history,examined this patient, reviewed notes, independently viewed imaging studies, participated in medical decision making and plan of care.ROS completed by me personally and pertinent positives fully documented  I have made any additions or clarifications directly to the above note. Agree with note above. She presented with transient episode of right-sided hemianoptic vision disturbance as well as confusion with remembering names and headache likely competent migraine episode rather than TIA. Recommend aspirin as well as statin and aggressive risk factor modification. Follow-up as an outpatient in the stroke clinic in 6 weeks. Discuss with Dr. Ree Kida. Greater than 50% time during this 35 minute visit was spent on counseling and coordination of care about TIA versus complicated migraine and discussion about stroke prevention and answering questions  Antony Contras, MD Medical Director [place] Pager: [phone] 03/22/2019 2:05 PM   To contact Stroke Continuity provider, please refer to [email or web address] After hours, contact General Neurology

## 2019-03-24 ENCOUNTER — Other Ambulatory Visit: Payer: Self-pay

## 2019-03-24 ENCOUNTER — Ambulatory Visit (INDEPENDENT_AMBULATORY_CARE_PROVIDER_SITE_OTHER): Payer: Medicare HMO | Admitting: Family Medicine

## 2019-03-24 ENCOUNTER — Encounter: Payer: Self-pay | Admitting: Family Medicine

## 2019-03-24 VITALS — Temp 97.5°F | Ht 63.5 in | Wt 135.0 lb

## 2019-03-24 DIAGNOSIS — F329 Major depressive disorder, single episode, unspecified: Secondary | ICD-10-CM

## 2019-03-24 DIAGNOSIS — G43109 Migraine with aura, not intractable, without status migrainosus: Secondary | ICD-10-CM

## 2019-03-24 DIAGNOSIS — R69 Illness, unspecified: Secondary | ICD-10-CM | POA: Diagnosis not present

## 2019-03-24 DIAGNOSIS — F419 Anxiety disorder, unspecified: Secondary | ICD-10-CM | POA: Diagnosis not present

## 2019-03-24 MED ORDER — BUSPIRONE HCL 7.5 MG PO TABS: 7.5000 mg | ORAL_TABLET | Freq: Two times a day (BID) | ORAL | 3 refills | Status: DC
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
## 2019-03-24 NOTE — Progress Notes (Signed)
I have discussed the procedure for the virtual visit with the patient who has given consent to proceed with assessment and treatment.   Jasmarie Coppock, CMA     

## 2019-03-28 DIAGNOSIS — G43109 Migraine with aura, not intractable, without status migrainosus: Secondary | ICD-10-CM | POA: Insufficient documentation

## 2019-04-01 ENCOUNTER — Encounter: Payer: Self-pay | Admitting: Family Medicine

## 2019-04-11 ENCOUNTER — Other Ambulatory Visit: Payer: Medicare HMO

## 2019-04-17 ENCOUNTER — Ambulatory Visit (INDEPENDENT_AMBULATORY_CARE_PROVIDER_SITE_OTHER): Payer: Medicare HMO | Admitting: Family Medicine

## 2019-04-17 ENCOUNTER — Encounter: Payer: Self-pay | Admitting: Family Medicine

## 2019-04-17 VITALS — BP 134/86 | HR 69 | Temp 98.0°F | Ht 63.5 in | Wt 134.0 lb

## 2019-04-17 DIAGNOSIS — F329 Major depressive disorder, single episode, unspecified: Secondary | ICD-10-CM

## 2019-04-17 DIAGNOSIS — R69 Illness, unspecified: Secondary | ICD-10-CM | POA: Diagnosis not present

## 2019-04-17 DIAGNOSIS — F419 Anxiety disorder, unspecified: Secondary | ICD-10-CM

## 2019-04-17 DIAGNOSIS — F32A Depression, unspecified: Secondary | ICD-10-CM

## 2019-04-17 MED ORDER — ATORVASTATIN CALCIUM 20 MG PO TABS: 20.0000 mg | ORAL_TABLET | Freq: Every day | ORAL | 3 refills | Status: DC

## 2019-04-17 NOTE — Progress Notes (Signed)
I have discussed the procedure for the virtual visit with the patient who has given consent to proceed with assessment and treatment.   BETHANY DILLARD, CMA     

## 2019-04-17 NOTE — Progress Notes (Signed)
Virtual Visit via Video   I connected with patient on 04/17/19 at 11:00 AM EDT by a video enabled telemedicine application and verified that I am speaking with the correct person using two identifiers.  Location patient: Home Location provider: [facility], Office Persons participating in the virtual visit: Patient, Provider, [place] ([place])  I discussed the limitations of evaluation and management by telemedicine and the availability of in person appointments. The patient expressed understanding and agreed to proceed.  Subjective:   HPI:   Anxiety- was started on Buspar 7.5mg  BID at last visit in addition to Wellbutrin.  Pt feels she is doing 'much better'.  Is avoiding TV.  Pt feels 'much more in control' of her emotions.  Is exercising (walking), doing home projects.  Has not had any additional migraines since last visit.    ROS:   See pertinent positives and negatives per HPI.  Patient Active Problem List   Diagnosis Date Noted  . Complicated migraine 12/87/8676  . Alcohol abuse, daily use 01/06/2019  . Routine health maintenance 08/06/2011  . VITAMIN B12 DEFICIENCY 07/18/2008  . Hyperlipidemia 02/15/2008  . Anxiety and depression 02/15/2008    Social History   Tobacco Use  . Smoking status: Former Smoker    Types: Cigarettes    Last attempt to quit: 01/22/2004    Years since quitting: 15.2  . Smokeless tobacco: Never Used  Substance Use Topics  . Alcohol use: Not on file    Current Outpatient Medications:  .  ALPRAZolam (XANAX) 0.25 MG tablet, Take 1 tablet (0.25 mg total) by mouth daily as needed. (Patient taking differently: Take 0.125-0.25 mg by mouth daily as needed for anxiety. ), Disp: 30 tablet, Rfl: 0 .  aspirin EC 81 MG EC tablet, Take 1 tablet (81 mg total) by mouth daily., Disp: 30 tablet, Rfl: 0 .  atorvastatin (LIPITOR) 20 MG tablet, Take 1 tablet (20 mg total) by mouth daily at 6 PM for 30 days., Disp: 30 tablet, Rfl: 0 .  buPROPion  (WELLBUTRIN XL) 150 MG 24 hr tablet, Take 1 tablet (150 mg total) by mouth daily., Disp: 90 tablet, Rfl: 1 .  busPIRone (BUSPAR) 7.5 MG tablet, Take 1 tablet (7.5 mg total) by mouth 2 (two) times daily., Disp: 60 tablet, Rfl: 3 .  ibuprofen (ADVIL,MOTRIN) 200 MG tablet, Take 400 mg by mouth daily as needed for headache., Disp: , Rfl:  .  loratadine (CLARITIN) 10 MG tablet, Take 10 mg by mouth daily., Disp: , Rfl:  .  Red Yeast Rice Extract (RED YEAST RICE PO), Take 1,200 mg by mouth daily., Disp: , Rfl:   No Known Allergies  Objective:   BP 134/86   Pulse 69   Temp 98 F (36.7 C)   Ht 5' 3.5" (1.613 m)   Wt 134 lb (60.8 kg)   BMI 23.36 kg/m   AAOx3, NAD NCAT, EOMI No obvious CN deficits Coloring WNL Pt is able to speak clearly, coherently without shortness of breath or increased work of breathing.  Thought process is linear.  Mood is appropriate.   Assessment and Plan:   Anxiety- improved since starting Buspar.  Pt is also avoiding the news that upsets her.  She wondered about stopping the Buspar but I told her I didn't think this was wise as we are not sure when the COVID situation will end/change.  I told her we can revisit stopping medication at future visits.  Pt expressed understanding and is in agreement w/ plan.  Annye Asa, MD 04/17/2019

## 2019-05-18 ENCOUNTER — Telehealth: Payer: Self-pay

## 2019-05-18 NOTE — Telephone Encounter (Signed)
I called pt that visit will be change to video due to COVID 19. I receive verbal consent to do video and to file insurance. I updated chart with PCP and pharmacy. Pt uses multiple pharmacies depending on the price of the medication.PT has a iphone and [employer] is her cell phone carrier. I confirmed her email address. Pt verbalized the doxy process.

## 2019-05-23 NOTE — Telephone Encounter (Signed)
EMail link was sent and text to pts cell phone.

## 2019-05-24 ENCOUNTER — Other Ambulatory Visit: Payer: Self-pay

## 2019-05-24 ENCOUNTER — Encounter: Payer: Self-pay | Admitting: Adult Health

## 2019-05-24 ENCOUNTER — Ambulatory Visit (INDEPENDENT_AMBULATORY_CARE_PROVIDER_SITE_OTHER): Payer: Medicare HMO | Admitting: Adult Health

## 2019-05-24 DIAGNOSIS — G44209 Tension-type headache, unspecified, not intractable: Secondary | ICD-10-CM

## 2019-05-24 DIAGNOSIS — G43109 Migraine with aura, not intractable, without status migrainosus: Secondary | ICD-10-CM | POA: Diagnosis not present

## 2019-05-24 NOTE — Progress Notes (Signed)
[facility] [address] [place]. [place] [address] [phone]       VIRTUAL VISIT FOLLOW UP NOTE  [name] Date of Birth:  1951-03-16 Medical Record Number:  [id number]   Reason for Referral: Complicated migraine follow up    Virtual Visit via Video Note  I connected with Dana Bryan on 05/24/19 at  3:15 PM EDT by a video enabled telemedicine application located remotely in my own home and verified that I am speaking with the correct person using two identifiers who was located at their own home.   Visit scheduled by Katharine Look, RN. She discussed the limitations of evaluation and management by telemedicine and the availability of in person appointments. The patient expressed understanding and agreed to proceed.Please see telephone note for additional scheduling information and consent.    HPI: Dana Bryan was initially scheduled today for in office hospital follow-up regarding complicated migraine episode with stroke work-up negative but due to COVID-19 safety precautions, visit transition to telemedicine via [email or web address] with patients consent. History obtained from patient and chart review. Reviewed all radiology images and labs personally.  Ms. [name] is a 68 y.o. female with history of HLD  who presented with transient visual change and memory loss shortly accompanied by headache.   Stroke work-up unremarkable and diagnosed with complicated migraine episode rather than left hemispheric TIA.  All imaging unremarkable.  LDL 136 and A1c 5.3.  Recommended aspirin 81 mg daily as well as atorvastatin 40 mg daily.  HTN stable.  Discharged home in stable condition without therapy needs.  She has been stable since hospital discharge without recurring or new neurological symptoms. She does have a history of migraines in the past with episode of visual loss (unaccomianed by headache) a couple years prior. Headaches have been stable without recent  reoccurrence previously but recently started to increase. She has been experiencing headaches daily over the past week with having to take Advil with mild benefit when needed. Headaches will typically occur in the afternoon. Not debilitating. Located on top of head and back of eyes bilaterally. Dull ache.  Denies photophobia, phonophobia, or nausea/vomiting.  No other neurological symptoms associated with these daily headaches.  She questions whether recent headaches due to sinus/allergies. Was previously taking Claritin but started to experience tinnitus therefore self discontinued on 03/19/19. Tinnitus remains. Headache frequency increased since discontinuation.   She continues on aspirin 81 mg without side effects of bleeding or bruising.  Continues on atorvastatin with possible statin myalgias. Will follow up with PCP in July with recheck of cholesterol levels and consider discontinuing.      ROS:   14 system review of systems performed and negative with exception of headaches  PMH:  Past Medical History:  Diagnosis Date  . Hyperlipidemia   . Reactive depression (situational)   . Vasomotor rhinitis     PSH:  Past Surgical History:  Procedure Laterality Date  . TONSILLECTOMY      Social History:  Social History   Socioeconomic History  . Marital status: Married    Spouse name: Not on file  . Number of children: Not on file  . Years of education: Not on file  . Highest education level: Not on file  Occupational History  . Not on file  Social Needs  . Financial resource strain: Not on file  . Food insecurity:    Worry: Not on file    Inability: Not on file  . Transportation needs:  Medical: Not on file    Non-medical: Not on file  Tobacco Use  . Smoking status: Former Smoker    Types: Cigarettes    Last attempt to quit: 01/22/2004    Years since quitting: 15.3  . Smokeless tobacco: Never Used  Substance and Sexual Activity  . Alcohol use: Not on file  . Drug use:  Not on file  . Sexual activity: Not on file  Lifestyle  . Physical activity:    Days per week: Not on file    Minutes per session: Not on file  . Stress: Not on file  Relationships  . Social connections:    Talks on phone: Not on file    Gets together: Not on file    Attends religious service: Not on file    Active member of club or organization: Not on file    Attends meetings of clubs or organizations: Not on file    Relationship status: Not on file  . Intimate partner violence:    Fear of current or ex partner: Not on file    Emotionally abused: Not on file    Physically abused: Not on file    Forced sexual activity: Not on file  Other Topics Concern  . Not on file  Social History Narrative   HSG, college Grad. Married '71. 2 Daughters- '77, '81. 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  '11 has returned to work- out of the home ( [place])    Family History:  Family History  Problem Relation Age of Onset  . Alcohol abuse Father   . Hypertension Mother     Medications:   Current Outpatient Medications on File Prior to Visit  Medication Sig Dispense Refill  . ALPRAZolam (XANAX) 0.25 MG tablet Take 1 tablet (0.25 mg total) by mouth daily as needed. (Patient taking differently: Take 0.125-0.25 mg by mouth daily as needed for anxiety. ) 30 tablet 0  . aspirin EC 81 MG EC tablet Take 1 tablet (81 mg total) by mouth daily. 30 tablet 0  . atorvastatin (LIPITOR) 20 MG tablet Take 1 tablet (20 mg total) by mouth daily. 30 tablet 3  . buPROPion (WELLBUTRIN XL) 150 MG 24 hr tablet Take 1 tablet (150 mg total) by mouth daily. 90 tablet 1  . busPIRone (BUSPAR) 7.5 MG tablet Take 1 tablet (7.5 mg total) by mouth 2 (two) times daily. 60 tablet 3  . ibuprofen (ADVIL,MOTRIN) 200 MG tablet Take 400 mg by mouth daily as needed for headache.    . loratadine (CLARITIN) 10 MG tablet Take 10 mg by mouth daily.    . Red Yeast Rice Extract (RED YEAST RICE PO) Take 1,200 mg  by mouth daily.     No current facility-administered medications on file prior to visit.     Allergies:  No Known Allergies   Physical Exam  General: well developed, well nourished, pleasant middle-age Caucasian female, seated, in no evident distress Head: head normocephalic and atraumatic.     Neurologic Exam Mental Status: Awake and fully alert. Oriented to place and time. Recent and remote memory intact. Attention span, concentration and fund of knowledge appropriate. Mood and affect appropriate.  Cranial Nerves: Extraocular movements full without nystagmus. Hearing intact to voice. Facial sensation intact. Face, tongue, palate moves normally and symmetrically.  Motor: No evidence of weakness per drift assessment. Sensory.: intact to light touch Coordination: Rapid alternating movements normal in all extremities. Finger-to-nose and heel-to-shin performed accurately bilaterally. Gait and  Station: Arises from chair without difficulty. Stance is normal. Gait demonstrates normal stride length and balance.  Reflexes: UTA      ASSESSMENT: MAR Dana Bryan is a 68 y.o. year old female here with episode of complicated migraine with negative stroke work-up on 03/21/2019.  Prior history of migraine with at times accompanied by auras.  Most recent migraine aura consisted of right-sided peripheral visual loss and memory loss.  She has not experienced any additional aura since this time but has been experiencing increase in headache frequency with characteristics of tension headache.    PLAN:  Recommended initiation of Topamax for headache management but patient declined at this time as she does not feel as though headaches are severe enough to take additional daily medication.  She will only take ibuprofen as needed approximately 1-2 times weekly.  Consider restarting allergy medication as this could be related to sinus.  Headaches could also be tension related and recommended paying closer  attention to any contributing factors such as fatigue, stress, lack of nutrition or lack of fluid.  Continue aspirin 81 mg and atorvastatin for stroke prevention.  She will continue to follow-up with PCP for HTN and HLD management.   Advised to follow-up as needed   Greater than 50% of time during this 30 minute non-face-to-face visit was spent on counseling, explanation of diagnosis of complicated migraine, planning of further management along with potential future management, and discussion with patient and family answering all questions.    Venancio Poisson, AGNP-BC  [facility] [address] [place], [place] [address]  Phone [phone] Fax [phone] Note: This document was prepared with digital dictation and possible smart phrase technology. Any transcriptional errors that result from this process are unintentional.

## 2019-05-26 NOTE — Progress Notes (Signed)
I agree with the above plan 

## 2019-06-21 ENCOUNTER — Telehealth: Payer: Self-pay | Admitting: Gastroenterology

## 2019-06-21 NOTE — Telephone Encounter (Signed)
Called patient to schedule a colonoscopy w/ pre visit  For CCS.patient will call back to scheduled.

## 2019-06-26 ENCOUNTER — Other Ambulatory Visit: Payer: Self-pay

## 2019-06-26 ENCOUNTER — Ambulatory Visit: Payer: Medicare HMO | Admitting: Family Medicine

## 2019-06-26 ENCOUNTER — Encounter: Payer: Self-pay | Admitting: Family Medicine

## 2019-06-26 ENCOUNTER — Ambulatory Visit (INDEPENDENT_AMBULATORY_CARE_PROVIDER_SITE_OTHER): Payer: Medicare HMO | Admitting: Family Medicine

## 2019-06-26 VITALS — Ht 63.5 in | Wt 135.0 lb

## 2019-06-26 DIAGNOSIS — E785 Hyperlipidemia, unspecified: Secondary | ICD-10-CM | POA: Diagnosis not present

## 2019-06-26 DIAGNOSIS — F32A Depression, unspecified: Secondary | ICD-10-CM

## 2019-06-26 DIAGNOSIS — F419 Anxiety disorder, unspecified: Secondary | ICD-10-CM | POA: Diagnosis not present

## 2019-06-26 DIAGNOSIS — F329 Major depressive disorder, single episode, unspecified: Secondary | ICD-10-CM

## 2019-06-26 DIAGNOSIS — R69 Illness, unspecified: Secondary | ICD-10-CM | POA: Diagnosis not present

## 2019-06-26 NOTE — Progress Notes (Signed)
I have discussed the procedure for the virtual visit with the patient who has given consent to proceed with assessment and treatment.   Jessica L Brodmerkel, CMA     

## 2019-06-26 NOTE — Progress Notes (Signed)
   Virtual Visit via Video   I connected with patient on 06/26/19 at 10:20 AM EDT by a video enabled telemedicine application and verified that I am speaking with the correct person using two identifiers.  Location patient: Home Location provider: Acupuncturist, Office Persons participating in the virtual visit: Patient, Provider, [place] (Jess B)  I discussed the limitations of evaluation and management by telemedicine and the availability of in person appointments. The patient expressed understanding and agreed to proceed.  Subjective:   HPI:   Hyperlipidemia- chronic problem, but pt was started on Lipitor a few months ago when she had questionable TIA.  'I feel pretty good'.  No CP, SOB, abd pain, N/V, HAs, visual changes.  Anxiety/depression- ongoing issue for pt.  Currently on Wellbutrin and Busapr.  Pt reports feeling well.  Interested in stopping Buspar 'when ready'.  ROS:   See pertinent positives and negatives per HPI.  Patient Active Problem List   Diagnosis Date Noted  . Complicated migraine 16/09/9603  . Alcohol abuse, daily use 01/06/2019  . Routine health maintenance 08/06/2011  . VITAMIN B12 DEFICIENCY 07/18/2008  . Hyperlipidemia 02/15/2008  . Anxiety and depression 02/15/2008    Social History   Tobacco Use  . Smoking status: Former Smoker    Types: Cigarettes    Quit date: 01/22/2004    Years since quitting: 15.4  . Smokeless tobacco: Never Used  Substance Use Topics  . Alcohol use: Not on file    Current Outpatient Medications:  .  ALPRAZolam (XANAX) 0.25 MG tablet, Take 1 tablet (0.25 mg total) by mouth daily as needed. (Patient taking differently: Take 0.125-0.25 mg by mouth daily as needed for anxiety. ), Disp: 30 tablet, Rfl: 0 .  aspirin EC 81 MG EC tablet, Take 1 tablet (81 mg total) by mouth daily., Disp: 30 tablet, Rfl: 0 .  atorvastatin (LIPITOR) 20 MG tablet, Take 1 tablet (20 mg total) by mouth daily., Disp: 30 tablet, Rfl: 3 .  buPROPion  (WELLBUTRIN XL) 150 MG 24 hr tablet, Take 1 tablet (150 mg total) by mouth daily., Disp: 90 tablet, Rfl: 1 .  busPIRone (BUSPAR) 7.5 MG tablet, Take 1 tablet (7.5 mg total) by mouth 2 (two) times daily., Disp: 60 tablet, Rfl: 3 .  ibuprofen (ADVIL,MOTRIN) 200 MG tablet, Take 400 mg by mouth daily as needed for headache., Disp: , Rfl:   No Known Allergies  Objective:   Ht 5' 3.5" (1.613 m)   Wt 135 lb (61.2 kg)   BMI 23.54 kg/m  AAOx3, NAD NCAT, EOMI No obvious CN deficits Coloring WNL Pt is able to speak clearly, coherently without shortness of breath or increased work of breathing.  Thought process is linear.  Mood is appropriate.   Assessment and Plan:   Hyperlipidemia- chronic problem.  Started statin ~3 months ago.  Currently asymptomatic.  Check labs.  Adjust meds prn   Anxiety/Depression- improved.  Pt is willing to half her Buspar to see if sxs remain controlled.  Will continue Wellbutrin at current dose.  Will follow.   Annye Asa, MD 06/26/2019

## 2019-06-29 ENCOUNTER — Ambulatory Visit (INDEPENDENT_AMBULATORY_CARE_PROVIDER_SITE_OTHER): Payer: Medicare HMO

## 2019-06-29 ENCOUNTER — Other Ambulatory Visit: Payer: Self-pay

## 2019-06-29 DIAGNOSIS — E785 Hyperlipidemia, unspecified: Secondary | ICD-10-CM

## 2019-06-29 LAB — LIPID PANEL
Cholesterol: 164 mg/dL (ref 0–200)
HDL: 74.2 mg/dL (ref >39.00)
LDL Cholesterol: 74 mg/dL (ref 0–99)
NonHDL: 90.23
Total CHOL/HDL Ratio: 2
Triglycerides: 81 mg/dL (ref 0.0–149.0)
VLDL: 16.2 mg/dL (ref 0.0–40.0)

## 2019-06-29 LAB — HEPATIC FUNCTION PANEL
ALT: 15 U/L (ref 0–35)
AST: 11 U/L (ref 0–37)
Albumin: 4.4 g/dL (ref 3.5–5.2)
Alkaline Phosphatase: 117 U/L (ref 39–117)
Bilirubin, Direct: 0.1 mg/dL (ref 0.0–0.3)
Total Bilirubin: 0.4 mg/dL (ref 0.2–1.2)
Total Protein: 6.7 g/dL (ref 6.0–8.3)

## 2019-06-29 LAB — BASIC METABOLIC PANEL
BUN: 16 mg/dL (ref 6–23)
CO2: 29 mEq/L (ref 19–32)
Calcium: 8.8 mg/dL (ref 8.4–10.5)
Chloride: 104 mEq/L (ref 96–112)
Creatinine, Ser: 0.74 mg/dL (ref 0.40–1.20)
GFR: 78.41 mL/min (ref >60.00)
Glucose, Bld: 96 mg/dL (ref 70–99)
Potassium: 4.2 mEq/L (ref 3.5–5.1)
Sodium: 140 mEq/L (ref 135–145)

## 2019-07-04 NOTE — Telephone Encounter (Signed)
Called and spoke to pt. She would like to wait until October to schedule her colonoscopy.  She would like to call us in September to get scheduled.

## 2019-07-11 DIAGNOSIS — R69 Illness, unspecified: Secondary | ICD-10-CM | POA: Diagnosis not present

## 2019-07-11 DIAGNOSIS — L723 Sebaceous cyst: Secondary | ICD-10-CM | POA: Diagnosis not present

## 2019-07-11 DIAGNOSIS — Q828 Other specified congenital malformations of skin: Secondary | ICD-10-CM | POA: Diagnosis not present

## 2019-07-11 DIAGNOSIS — L565 Disseminated superficial actinic porokeratosis (DSAP): Secondary | ICD-10-CM | POA: Diagnosis not present

## 2019-07-11 DIAGNOSIS — L570 Actinic keratosis: Secondary | ICD-10-CM | POA: Diagnosis not present

## 2019-07-11 DIAGNOSIS — D485 Neoplasm of uncertain behavior of skin: Secondary | ICD-10-CM | POA: Diagnosis not present

## 2019-07-11 DIAGNOSIS — L814 Other melanin hyperpigmentation: Secondary | ICD-10-CM | POA: Diagnosis not present

## 2019-07-11 DIAGNOSIS — D1801 Hemangioma of skin and subcutaneous tissue: Secondary | ICD-10-CM | POA: Diagnosis not present

## 2019-07-11 DIAGNOSIS — L720 Epidermal cyst: Secondary | ICD-10-CM | POA: Diagnosis not present

## 2019-07-11 DIAGNOSIS — L821 Other seborrheic keratosis: Secondary | ICD-10-CM | POA: Diagnosis not present

## 2019-07-11 DIAGNOSIS — D225 Melanocytic nevi of trunk: Secondary | ICD-10-CM | POA: Diagnosis not present

## 2019-07-19 ENCOUNTER — Other Ambulatory Visit: Payer: Self-pay | Admitting: Family Medicine

## 2019-08-02 ENCOUNTER — Encounter: Payer: Medicare HMO | Admitting: Family Medicine

## 2019-08-15 ENCOUNTER — Ambulatory Visit
Admission: RE | Admit: 2019-08-15 | Discharge: 2019-08-15 | Disposition: A | Discharge status: Home / Self Care | Payer: Medicare HMO | Source: Ambulatory Visit | Attending: Family Medicine | Admitting: Family Medicine

## 2019-08-15 ENCOUNTER — Other Ambulatory Visit: Payer: Self-pay

## 2019-08-15 DIAGNOSIS — Z1239 Encounter for other screening for malignant neoplasm of breast: Secondary | ICD-10-CM

## 2019-08-15 DIAGNOSIS — Z1231 Encounter for screening mammogram for malignant neoplasm of breast: Secondary | ICD-10-CM | POA: Diagnosis not present

## 2019-08-16 ENCOUNTER — Other Ambulatory Visit: Payer: Self-pay | Admitting: Family Medicine

## 2019-08-18 ENCOUNTER — Other Ambulatory Visit: Payer: Self-pay | Admitting: Family Medicine

## 2019-08-18 DIAGNOSIS — R928 Other abnormal and inconclusive findings on diagnostic imaging of breast: Secondary | ICD-10-CM

## 2019-08-25 ENCOUNTER — Other Ambulatory Visit: Payer: Self-pay

## 2019-08-25 ENCOUNTER — Ambulatory Visit
Admission: RE | Admit: 2019-08-25 | Discharge: 2019-08-25 | Disposition: A | Discharge status: Home / Self Care | Payer: Medicare HMO | Source: Ambulatory Visit | Attending: Family Medicine | Admitting: Family Medicine

## 2019-08-25 DIAGNOSIS — R928 Other abnormal and inconclusive findings on diagnostic imaging of breast: Secondary | ICD-10-CM

## 2019-08-25 DIAGNOSIS — N6489 Other specified disorders of breast: Secondary | ICD-10-CM | POA: Diagnosis not present

## 2019-09-06 ENCOUNTER — Other Ambulatory Visit: Payer: Self-pay | Admitting: Family Medicine

## 2019-09-18 DIAGNOSIS — R69 Illness, unspecified: Secondary | ICD-10-CM | POA: Diagnosis not present

## 2019-09-20 DIAGNOSIS — L720 Epidermal cyst: Secondary | ICD-10-CM | POA: Diagnosis not present

## 2019-10-04 ENCOUNTER — Other Ambulatory Visit: Payer: Self-pay

## 2019-10-04 DIAGNOSIS — Z20828 Contact with and (suspected) exposure to other viral communicable diseases: Secondary | ICD-10-CM | POA: Diagnosis not present

## 2019-10-04 DIAGNOSIS — Z20822 Contact with and (suspected) exposure to covid-19: Secondary | ICD-10-CM

## 2019-10-05 LAB — NOVEL CORONAVIRUS, NAA: SARS-CoV-2, NAA: NOT DETECTED

## 2019-10-24 DIAGNOSIS — H52201 Unspecified astigmatism, right eye: Secondary | ICD-10-CM | POA: Diagnosis not present

## 2019-10-24 DIAGNOSIS — H5213 Myopia, bilateral: Secondary | ICD-10-CM | POA: Diagnosis not present

## 2019-10-24 DIAGNOSIS — H524 Presbyopia: Secondary | ICD-10-CM | POA: Diagnosis not present

## 2019-10-29 ENCOUNTER — Encounter: Payer: Self-pay | Admitting: Family Medicine

## 2019-10-30 NOTE — Telephone Encounter (Signed)
Last OV 06/26/19 Alprazolam last filled 10/24/16 #30 with 0

## 2019-10-31 MED ORDER — ALPRAZOLAM 0.25 MG PO TABS: 0.2500 mg | ORAL_TABLET | Freq: Every day | ORAL | 0 refills | Status: DC | PRN

## 2019-10-31 NOTE — Addendum Note (Signed)
Addended by: Davis Gourd on: 10/31/2019 08:11 AM   Modules accepted: Orders

## 2019-11-02 MED ORDER — ALPRAZOLAM 0.25 MG PO TABS: 0.2500 mg | ORAL_TABLET | Freq: Every day | ORAL | 0 refills | Status: DC | PRN

## 2019-11-02 NOTE — Telephone Encounter (Signed)
Is originally printed, has it been resent to pharmacy electronically?

## 2019-11-28 ENCOUNTER — Encounter: Payer: Self-pay | Admitting: Family Medicine

## 2019-12-06 ENCOUNTER — Other Ambulatory Visit: Payer: Self-pay | Admitting: Family Medicine

## 2019-12-09 ENCOUNTER — Other Ambulatory Visit: Payer: Self-pay | Admitting: Family Medicine

## 2019-12-20 ENCOUNTER — Other Ambulatory Visit: Payer: Self-pay | Admitting: Family Medicine

## 2019-12-22 HISTORY — PX: COLONOSCOPY W/ POLYPECTOMY: SHX1380

## 2020-01-04 ENCOUNTER — Other Ambulatory Visit: Payer: Self-pay | Admitting: Family Medicine

## 2020-01-16 ENCOUNTER — Encounter: Payer: Medicare HMO | Admitting: Family Medicine

## 2020-01-16 IMAGING — MG DIGITAL SCREENING BILATERAL MAMMOGRAM WITH TOMO AND CAD
8 series · 8 of 24 positions shown · non-contrast
Comparison: Previous exam(s).

CLINICAL DATA: Screening.

EXAM:
DIGITAL SCREENING BILATERAL MAMMOGRAM WITH TOMO AND CAD

[L CC synth-2D]
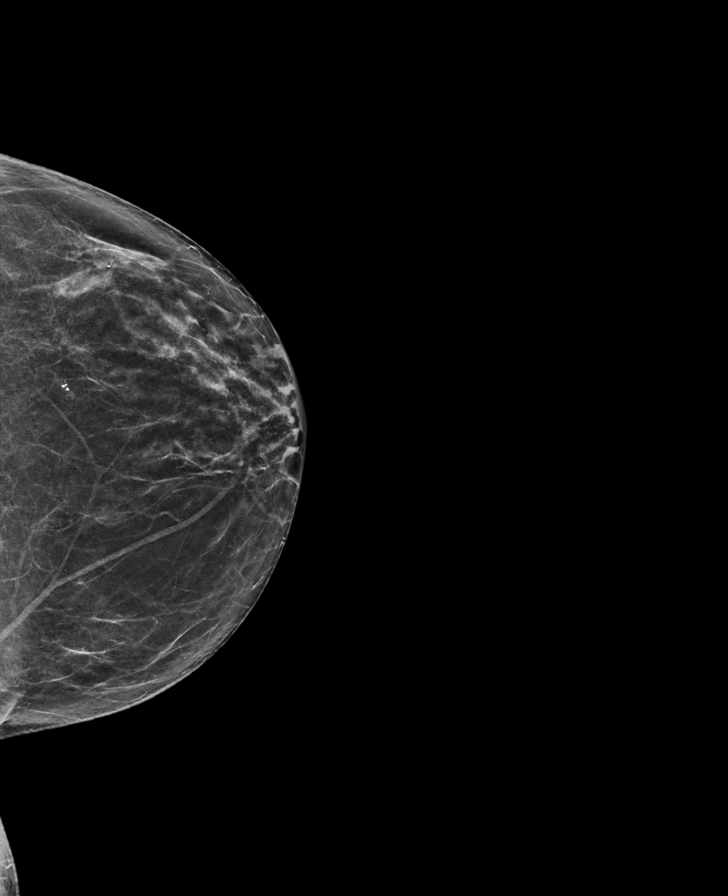

[L MLO synth-2D]
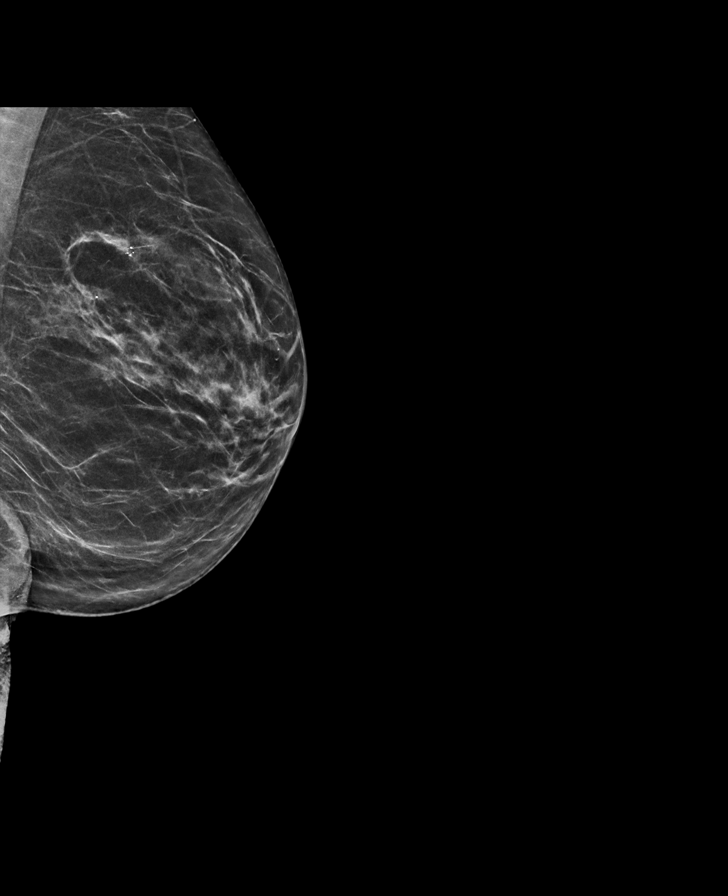

[R CC synth-2D]
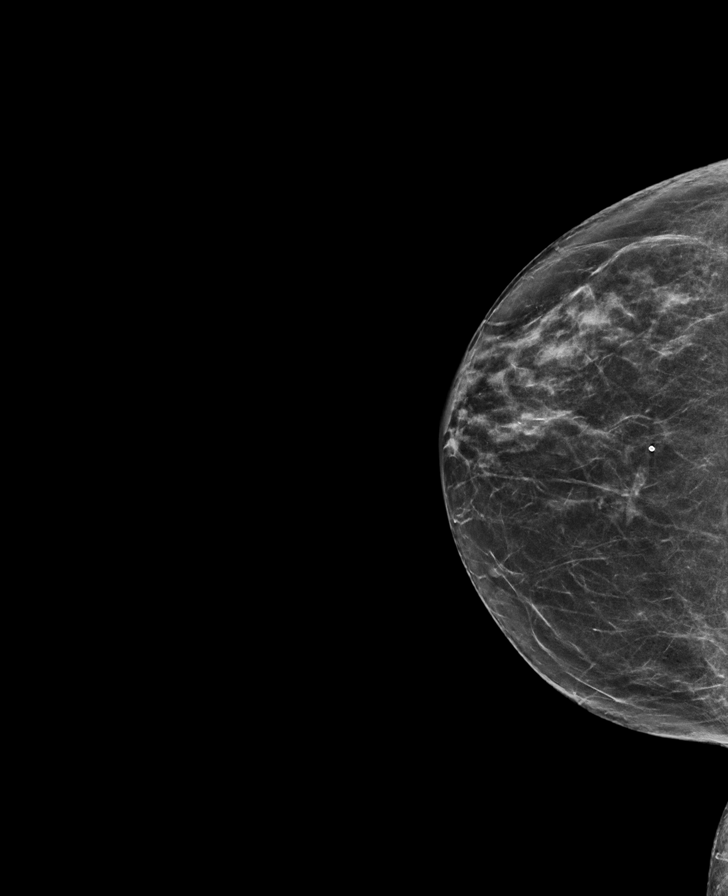

[R MLO synth-2D]
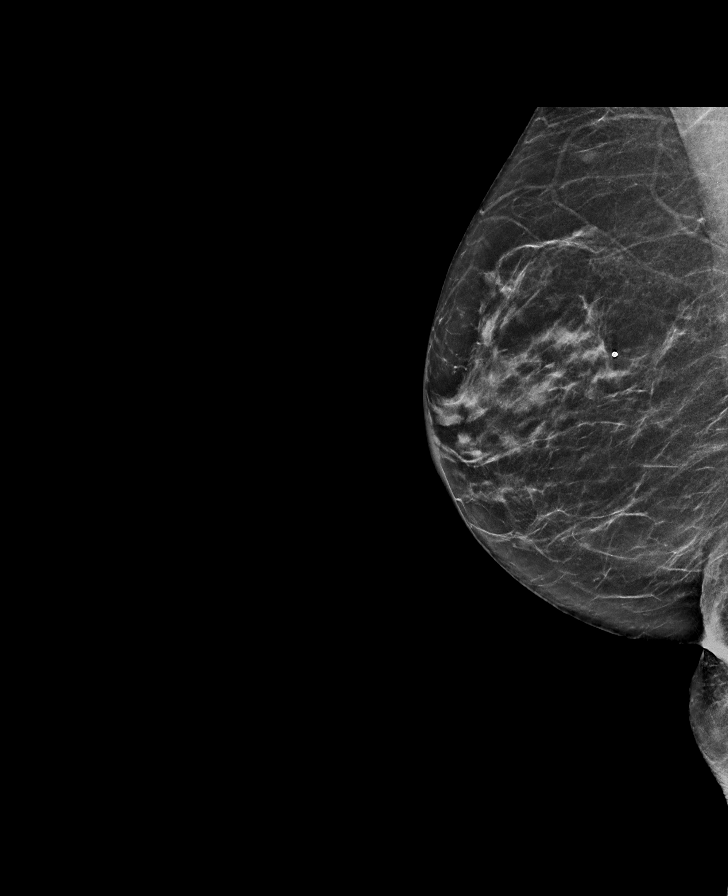

[L CC tomo · tomo slice 31/60.0]
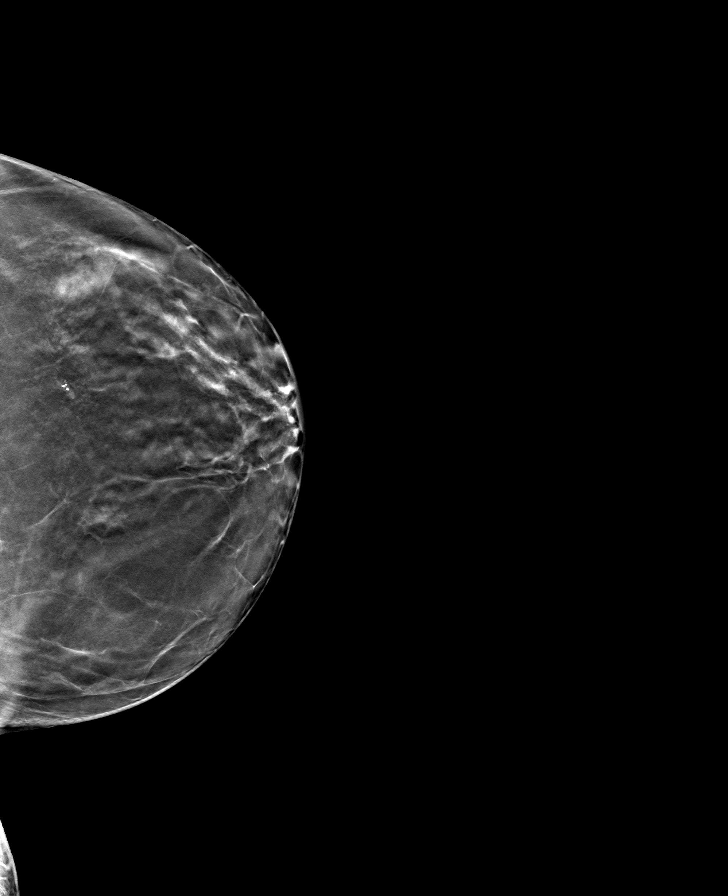

[L MLO tomo · tomo slice 31/61.0]
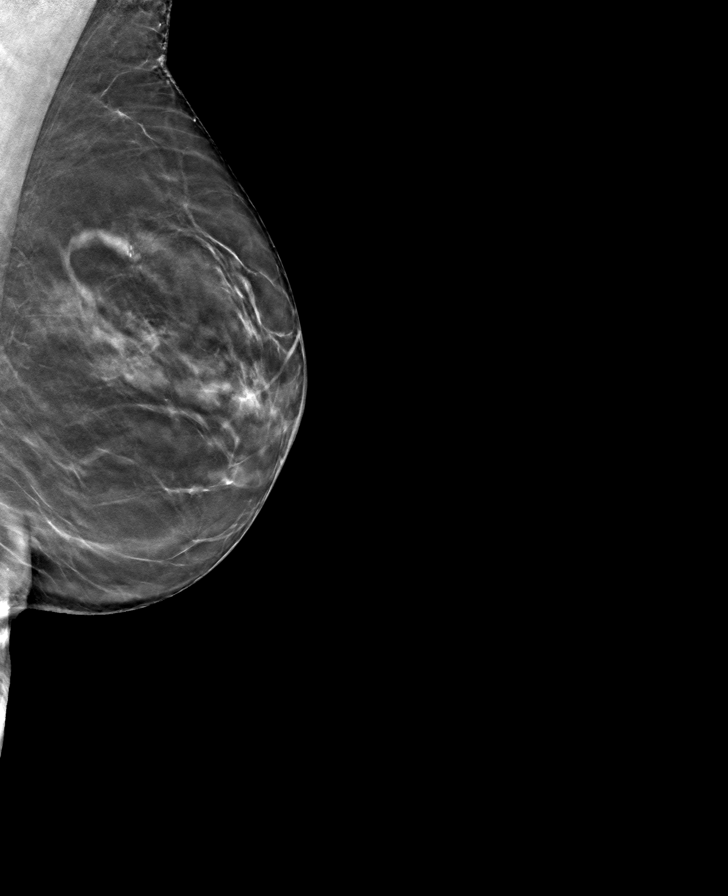

[R MLO tomo · tomo slice 32/63.0]
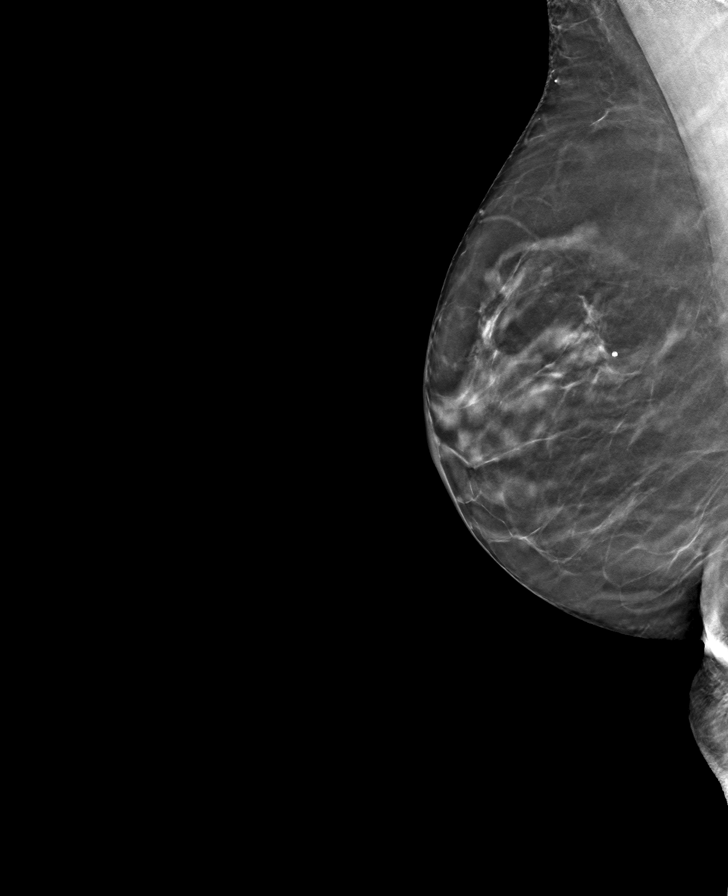

[R CC tomo · tomo slice 31/60.0]
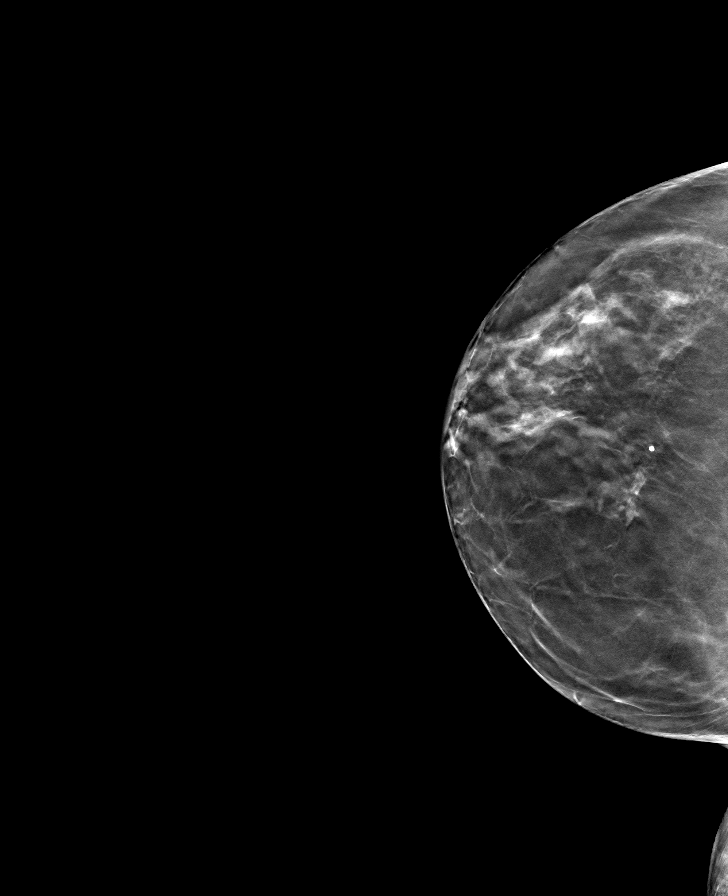

[8 of 24 positions shown; findings below may reference images not displayed]

ACR Breast Density Category b: There are scattered areas of
fibroglandular density.
FINDINGS: In the right breast, a possible asymmetry warrants further
evaluation. In the left breast, no findings suspicious for
malignancy. Images were processed with CAD.
IMPRESSION: Further evaluation is suggested for possible asymmetry in the right
breast.

RECOMMENDATION:
Diagnostic mammogram and possibly ultrasound of the right breast.
(Code:[id number]-55T)

The patient will be contacted regarding the findings, and additional
imaging will be scheduled.

BI-RADS CATEGORY  0: Incomplete. Need additional imaging evaluation
and/or prior mammograms for comparison.

## 2020-01-23 ENCOUNTER — Ambulatory Visit (INDEPENDENT_AMBULATORY_CARE_PROVIDER_SITE_OTHER): Payer: Medicare HMO | Admitting: Family Medicine

## 2020-01-23 ENCOUNTER — Other Ambulatory Visit: Payer: Self-pay

## 2020-01-23 ENCOUNTER — Encounter: Payer: Self-pay | Admitting: Family Medicine

## 2020-01-23 VITALS — BP 136/82 | HR 76 | Temp 97.9°F | Resp 16 | Ht 64.0 in | Wt 135.4 lb

## 2020-01-23 DIAGNOSIS — R011 Cardiac murmur, unspecified: Secondary | ICD-10-CM | POA: Diagnosis not present

## 2020-01-23 DIAGNOSIS — Z0000 Encounter for general adult medical examination without abnormal findings: Secondary | ICD-10-CM

## 2020-01-23 DIAGNOSIS — E2839 Other primary ovarian failure: Secondary | ICD-10-CM

## 2020-01-23 DIAGNOSIS — E785 Hyperlipidemia, unspecified: Secondary | ICD-10-CM

## 2020-01-23 LAB — BASIC METABOLIC PANEL
BUN: 13 mg/dL (ref 6–23)
CO2: 29 mEq/L (ref 19–32)
Calcium: 9.5 mg/dL (ref 8.4–10.5)
Chloride: 102 mEq/L (ref 96–112)
Creatinine, Ser: 0.71 mg/dL (ref 0.40–1.20)
GFR: 82.1 mL/min (ref >60.00)
Glucose, Bld: 101 mg/dL — ABNORMAL HIGH (ref 70–99)
Potassium: 4.2 mEq/L (ref 3.5–5.1)
Sodium: 139 mEq/L (ref 135–145)

## 2020-01-23 LAB — CBC WITH DIFFERENTIAL/PLATELET
Basophils Absolute: 0 10*3/uL (ref 0.0–0.1)
Basophils Relative: 0.3 % (ref 0.0–3.0)
Eosinophils Absolute: 0.1 10*3/uL (ref 0.0–0.7)
Eosinophils Relative: 1.4 % (ref 0.0–5.0)
HCT: 42.9 % (ref 36.0–46.0)
Hemoglobin: 14.5 g/dL (ref 12.0–15.0)
Lymphocytes Relative: 22.4 % (ref 12.0–46.0)
Lymphs Abs: 1.3 10*3/uL (ref 0.7–4.0)
MCHC: 33.7 g/dL (ref 30.0–36.0)
MCV: 91.4 fl (ref 78.0–100.0)
Monocytes Absolute: 0.5 10*3/uL (ref 0.1–1.0)
Monocytes Relative: 8 % (ref 3.0–12.0)
Neutro Abs: 3.8 10*3/uL (ref 1.4–7.7)
Neutrophils Relative %: 67.9 % (ref 43.0–77.0)
Platelets: 247 10*3/uL (ref 150.0–400.0)
RBC: 4.69 Mil/uL (ref 3.87–5.11)
RDW: 14.3 % (ref 11.5–15.5)
WBC: 5.7 10*3/uL (ref 4.0–10.5)

## 2020-01-23 LAB — HEPATIC FUNCTION PANEL
ALT: 15 U/L (ref 0–35)
AST: 13 U/L (ref 0–37)
Albumin: 4.6 g/dL (ref 3.5–5.2)
Alkaline Phosphatase: 99 U/L (ref 39–117)
Bilirubin, Direct: 0.1 mg/dL (ref 0.0–0.3)
Total Bilirubin: 0.6 mg/dL (ref 0.2–1.2)
Total Protein: 7.1 g/dL (ref 6.0–8.3)

## 2020-01-23 LAB — LIPID PANEL
Cholesterol: 177 mg/dL (ref 0–200)
HDL: 80.9 mg/dL (ref >39.00)
LDL Cholesterol: 81 mg/dL (ref 0–99)
NonHDL: 95.96
Total CHOL/HDL Ratio: 2
Triglycerides: 75 mg/dL (ref 0.0–149.0)
VLDL: 15 mg/dL (ref 0.0–40.0)

## 2020-01-23 LAB — TSH: TSH: 7.66 u[IU]/mL — ABNORMAL HIGH (ref 0.35–4.50)

## 2020-01-23 NOTE — Assessment & Plan Note (Signed)
Chronic problem.  Tolerating statin w/o difficulty.  Check labs.  Adjust meds prn  

## 2020-01-23 NOTE — Progress Notes (Signed)
Subjective:    Patient ID: Dana Bryan, female    DOB: Apr 30, 1951, 69 y.o.   MRN: [id number]  HPI Here today for CPE and MWV.  Risk Factors: Hyperlipidemia- chronic problem, on Lipitor 20mg  daily Anxiety/depression- chronic problem, on Wellbutrin, Buspar, and Alprazolam Physical Activity: no regular exercise but active Fall Risk: low risk Depression: chronic problem, currently well controlled on medications Hearing: normal to conversational tones and whispered voice at 6 ft ADL's: independent Cognitive: normal linear thought process, memory and attention intact Home Safety: safe at home Height, Weight, BMI, Visual Acuity: see vitals, vision corrected to 20/20 w/ glasses Counseling: due for colonoscopy (has had discussion w/ Dr Havery Moros- waiting until elective procedures resume), Pneumovax, DEXA.  UTD on mammo, Tdap, flu.  Plans to do COVID vaccine. Labs Ordered: See A&P Care Plan: See A&P   Reviewed past medical, surgical, family and social histories.   Health Maintenance  Topic Date Due  . COLONOSCOPY  04/05/2017  . DEXA SCAN  01/12/2018  . PNA vac Low Risk Adult (2 of 2 - PPSV23) 01/07/2020  . MAMMOGRAM  08/14/2021  . TETANUS/TDAP  01/06/2026  . INFLUENZA VACCINE  Completed  . Hepatitis C Screening  Completed    Patient Care Team    Relationship Specialty Notifications Start End  Midge Minium, MD PCP - General Family Medicine  01/06/19   Paula Compton, MD Consulting Physician Obstetrics and Gynecology  01/06/19   Armbruster, Carlota Raspberry, MD Consulting Physician Gastroenterology  01/23/20      Review of Systems Patient reports no vision/ hearing changes, adenopathy,fever, weight change,  persistant/recurrent hoarseness , swallowing issues, chest pain, palpitations, edema, persistant/recurrent cough, hemoptysis, dyspnea (rest/exertional/paroxysmal nocturnal), gastrointestinal bleeding (melena, rectal bleeding), abdominal pain, significant heartburn, bowel  changes, GU symptoms (dysuria, hematuria, incontinence), Gyn symptoms (abnormal  bleeding, pain),  syncope, focal weakness, memory loss, numbness & tingling, skin/hair/nail changes, abnormal bruising or bleeding, anxiety, or depression.   This visit occurred during the SARS-CoV-2 public health emergency.  Safety protocols were in place, including screening questions prior to the visit, additional usage of staff PPE, and extensive cleaning of exam room while observing appropriate contact time as indicated for disinfecting solutions.       Objective:   Physical Exam General Appearance:    Alert, cooperative, no distress, appears stated age  Head:    Normocephalic, without obvious abnormality, atraumatic  Eyes:    PERRL, conjunctiva/corneas clear, EOM's intact, fundi    benign, both eyes  Ears:    Normal TM's and external ear canals, both ears  Nose:   Deferred due to COVID  Throat:   Neck:   Supple, symmetrical, trachea midline, no adenopathy;    Thyroid: no enlargement/tenderness/nodules  Back:     Symmetric, no curvature, ROM normal, no CVA tenderness  Lungs:     Clear to auscultation bilaterally, respirations unlabored  Chest Wall:    No tenderness or deformity   Heart:    Regular rate and rhythm, S1 and S2 normal, I/VI SEM at RUSB, no rub or gallop  Breast Exam:    Deferred to GYN  Abdomen:     Soft, non-tender, bowel sounds active all four quadrants,    no masses, no organomegaly  Genitalia:    Deferred to GYN  Rectal:    Extremities:   Extremities normal, atraumatic, no cyanosis or edema  Pulses:   2+ and symmetric all extremities  Skin:   Skin color, texture, turgor normal, no rashes or lesions  Lymph nodes:   Cervical, supraclavicular, and axillary nodes normal  Neurologic:   CNII-XII intact, normal strength, sensation and reflexes    throughout          Assessment & Plan:

## 2020-01-23 NOTE — Assessment & Plan Note (Signed)
Pt's PE WNL w/ exception of new, very faint heart murmur.  DEXA ordered.  Will defer Pneumovax at this time due to upcoming COVID vaccine.  Pt to have colonoscopy when elective procedures resume.  Discussed need for healthy diet and regular exercise.  Reviewed upcoming health maintenance items.  Check labs.

## 2020-01-23 NOTE — Assessment & Plan Note (Signed)
New.  Very faint.  Suspect this is a flow murmur as patient has not had much water this AM.  Will follow at future visits and if this changes or intensifies will get ECHO to assess.  Pt expressed understanding and is in agreement w/ plan.

## 2020-01-23 NOTE — Patient Instructions (Addendum)
Follow up in 6 months to recheck cholesterol We'll notify you of your lab results and make any changes if needed  Continue to work on healthy diet and regular exercise- you look great! Call [facility] to check on the COVID vaccine situation When I hear from the counseling department, I'll let you know We'll hold off on the Pneumovax until after the COVID vaccine We'll call you with the bone density appt Call with any questions or concerns Stay Safe!  Stay Healthy!   Preventive Care 67 Years and Older, Female Preventive care refers to lifestyle choices and visits with your health care provider that can promote health and wellness. This includes:  A yearly physical exam. This is also called an annual well check.  Regular dental and eye exams.  Immunizations.  Screening for certain conditions.  Healthy lifestyle choices, such as diet and exercise. What can I expect for my preventive care visit? Physical exam Your health care provider will check:  Height and weight. These may be used to calculate body mass index (BMI), which is a measurement that tells if you are at a healthy weight.  Heart rate and blood pressure.  Your skin for abnormal spots. Counseling Your health care provider may ask you questions about:  Alcohol, tobacco, and drug use.  Emotional well-being.  Home and relationship well-being.  Sexual activity.  Eating habits.  History of falls.  Memory and ability to understand (cognition).  Work and work Statistician.  Pregnancy and menstrual history. What immunizations do I need?  Influenza (flu) vaccine  This is recommended every year. Tetanus, diphtheria, and pertussis (Tdap) vaccine  You may need a Td booster every 10 years. Varicella (chickenpox) vaccine  You may need this vaccine if you have not already been vaccinated. Zoster (shingles) vaccine  You may need this after age 37. Pneumococcal conjugate (PCV13) vaccine  One dose is recommended after  age 67. Pneumococcal polysaccharide (PPSV23) vaccine  One dose is recommended after age 67. Measles, mumps, and rubella (MMR) vaccine  You may need at least one dose of MMR if you were born in 1957 or later. You may also need a second dose. Meningococcal conjugate (MenACWY) vaccine  You may need this if you have certain conditions. Hepatitis A vaccine  You may need this if you have certain conditions or if you travel or work in places where you may be exposed to hepatitis A. Hepatitis B vaccine  You may need this if you have certain conditions or if you travel or work in places where you may be exposed to hepatitis B. Haemophilus influenzae type b (Hib) vaccine  You may need this if you have certain conditions. You may receive vaccines as individual doses or as more than one vaccine together in one shot (combination vaccines). Talk with your health care provider about the risks and benefits of combination vaccines. What tests do I need? Blood tests  Lipid and cholesterol levels. These may be checked every 5 years, or more frequently depending on your overall health.  Hepatitis C test.  Hepatitis B test. Screening  Lung cancer screening. You may have this screening every year starting at age 67 if you have a 30-pack-year history of smoking and currently smoke or have quit within the past 15 years.  Colorectal cancer screening. All adults should have this screening starting at age 67 and continuing until age 67. Your health care provider may recommend screening at age 56 if you are at increased risk. You will have tests  every 1-10 years, depending on your results and the type of screening test.  Diabetes screening. This is done by checking your blood sugar (glucose) after you have not eaten for a while (fasting). You may have this done every 1-3 years.  Mammogram. This may be done every 1-2 years. Talk with your health care provider about how often you should have regular  mammograms.  BRCA-related cancer screening. This may be done if you have a family history of breast, ovarian, tubal, or peritoneal cancers. Other tests  Sexually transmitted disease (STD) testing.  Bone density scan. This is done to screen for osteoporosis. You may have this done starting at age 67. Follow these instructions at home: Eating and drinking  Eat a diet that includes fresh fruits and vegetables, whole grains, lean protein, and low-fat dairy products. Limit your intake of foods with high amounts of sugar, saturated fats, and salt.  Take vitamin and mineral supplements as recommended by your health care provider.  Do not drink alcohol if your health care provider tells you not to drink.  If you drink alcohol: ? Limit how much you have to 0-1 drink a day. ? Be aware of how much alcohol is in your drink. In the [place] one drink equals one 12 oz bottle of beer (355 mL), one 5 oz glass of wine (148 mL), or one 1 oz glass of hard liquor (44 mL). Lifestyle  Take daily care of your teeth and gums.  Stay active. Exercise for at least 30 minutes on 5 or more days each week.  Do not use any products that contain nicotine or tobacco, such as cigarettes, e-cigarettes, and chewing tobacco. If you need help quitting, ask your health care provider.  If you are sexually active, practice safe sex. Use a condom or other form of protection in order to prevent STIs (sexually transmitted infections).  Talk with your health care provider about taking a low-dose aspirin or statin. What's next?  Go to your health care provider once a year for a well check visit.  Ask your health care provider how often you should have your eyes and teeth checked.  Stay up to date on all vaccines. This information is not intended to replace advice given to you by your health care provider. Make sure you discuss any questions you have with your health care provider. Document Revised: 12/01/2018 Document  Reviewed: 12/01/2018 Elsevier Patient Education  2020 [employer].

## 2020-01-24 ENCOUNTER — Encounter: Payer: Self-pay | Admitting: Family Medicine

## 2020-01-24 ENCOUNTER — Other Ambulatory Visit (INDEPENDENT_AMBULATORY_CARE_PROVIDER_SITE_OTHER): Payer: Medicare HMO

## 2020-01-24 DIAGNOSIS — R7989 Other specified abnormal findings of blood chemistry: Secondary | ICD-10-CM

## 2020-01-24 LAB — T4, FREE: Free T4: 0.72 ng/dL (ref 0.60–1.60)

## 2020-01-24 LAB — T3, FREE: T3, Free: 3.4 pg/mL (ref 2.3–4.2)

## 2020-02-01 DIAGNOSIS — R69 Illness, unspecified: Secondary | ICD-10-CM | POA: Diagnosis not present

## 2020-02-08 ENCOUNTER — Inpatient Hospital Stay: Admission: RE | Admit: 2020-02-08 | Payer: Medicare HMO | Source: Ambulatory Visit

## 2020-02-11 ENCOUNTER — Ambulatory Visit: Payer: Medicare HMO | Attending: Internal Medicine

## 2020-02-11 DIAGNOSIS — Z23 Encounter for immunization: Secondary | ICD-10-CM | POA: Insufficient documentation

## 2020-02-11 NOTE — Progress Notes (Signed)
   Covid-19 Vaccination Clinic  Name:  Dana Bryan    MRN: [id number] DOB: [birth year]  02/11/2020  Dana Bryan was observed post Covid-19 immunization for 15 minutes without incidence. She was provided with Vaccine Information Sheet and instruction to access the V-Safe system.   Dana Bryan was instructed to call 911 with any severe reactions post vaccine: Marland Kitchen Difficulty breathing  . Swelling of your face and throat  . A fast heartbeat  . A bad rash all over your body  . Dizziness and weakness    Immunizations Administered    Name Date Dose VIS Date Route   Pfizer COVID-19 Vaccine 02/11/2020  1:25 PM 0.3 mL 12/01/2019 Intramuscular   Manufacturer: [place]   Lot: [id number]   [place]: [id number]

## 2020-02-12 ENCOUNTER — Ambulatory Visit (INDEPENDENT_AMBULATORY_CARE_PROVIDER_SITE_OTHER): Payer: Medicare HMO | Admitting: Psychology

## 2020-02-12 DIAGNOSIS — F331 Major depressive disorder, recurrent, moderate: Secondary | ICD-10-CM

## 2020-02-12 DIAGNOSIS — F411 Generalized anxiety disorder: Secondary | ICD-10-CM | POA: Diagnosis not present

## 2020-02-12 DIAGNOSIS — R69 Illness, unspecified: Secondary | ICD-10-CM | POA: Diagnosis not present

## 2020-02-13 ENCOUNTER — Ambulatory Visit (INDEPENDENT_AMBULATORY_CARE_PROVIDER_SITE_OTHER)
Admission: RE | Admit: 2020-02-13 | Discharge: 2020-02-13 | Disposition: A | Discharge status: Home / Self Care | Payer: Medicare HMO | Source: Ambulatory Visit | Attending: Family Medicine | Admitting: Family Medicine

## 2020-02-13 ENCOUNTER — Other Ambulatory Visit: Payer: Self-pay

## 2020-02-13 DIAGNOSIS — E2839 Other primary ovarian failure: Secondary | ICD-10-CM

## 2020-02-28 ENCOUNTER — Ambulatory Visit (INDEPENDENT_AMBULATORY_CARE_PROVIDER_SITE_OTHER): Payer: Medicare HMO | Admitting: Psychology

## 2020-02-28 DIAGNOSIS — R69 Illness, unspecified: Secondary | ICD-10-CM | POA: Diagnosis not present

## 2020-02-28 DIAGNOSIS — F331 Major depressive disorder, recurrent, moderate: Secondary | ICD-10-CM | POA: Diagnosis not present

## 2020-02-28 DIAGNOSIS — F411 Generalized anxiety disorder: Secondary | ICD-10-CM

## 2020-03-01 DIAGNOSIS — H00024 Hordeolum internum left upper eyelid: Secondary | ICD-10-CM | POA: Diagnosis not present

## 2020-03-06 ENCOUNTER — Ambulatory Visit: Payer: Medicare HMO | Attending: Internal Medicine

## 2020-03-06 DIAGNOSIS — R69 Illness, unspecified: Secondary | ICD-10-CM | POA: Diagnosis not present

## 2020-03-06 DIAGNOSIS — Z23 Encounter for immunization: Secondary | ICD-10-CM

## 2020-03-06 NOTE — Progress Notes (Signed)
   Covid-19 Vaccination Clinic  Name:  Nimrat Knoblauch    MRN: [id number] DOB: [birth year]  03/06/2020  Ms. Hodde was observed post Covid-19 immunization for 15 minutes without incident. She was provided with Vaccine Information Sheet and instruction to access the V-Safe system.   Ms. Rumple was instructed to call 911 with any severe reactions post vaccine: Marland Kitchen Difficulty breathing  . Swelling of face and throat  . A fast heartbeat  . A bad rash all over body  . Dizziness and weakness   Immunizations Administered    Name Date Dose VIS Date Route   Pfizer COVID-19 Vaccine 03/06/2020  9:02 AM 0.3 mL 12/01/2019 Intramuscular   Manufacturer: [place]   Lot: [id number]   [place]: [id number]

## 2020-03-07 ENCOUNTER — Other Ambulatory Visit: Payer: Self-pay | Admitting: Family Medicine

## 2020-03-13 ENCOUNTER — Other Ambulatory Visit: Payer: Self-pay | Admitting: Family Medicine

## 2020-03-15 ENCOUNTER — Encounter: Payer: Self-pay | Admitting: Family Medicine

## 2020-03-27 ENCOUNTER — Ambulatory Visit (INDEPENDENT_AMBULATORY_CARE_PROVIDER_SITE_OTHER): Payer: Medicare HMO | Admitting: Psychology

## 2020-03-27 DIAGNOSIS — F331 Major depressive disorder, recurrent, moderate: Secondary | ICD-10-CM | POA: Diagnosis not present

## 2020-03-27 DIAGNOSIS — R69 Illness, unspecified: Secondary | ICD-10-CM | POA: Diagnosis not present

## 2020-03-27 DIAGNOSIS — F411 Generalized anxiety disorder: Secondary | ICD-10-CM | POA: Diagnosis not present

## 2020-04-01 ENCOUNTER — Telehealth: Payer: Self-pay | Admitting: Family Medicine

## 2020-04-01 NOTE — Progress Notes (Signed)
  Chronic Care Management   Outreach Note  04/01/2020 Name: Helenann Vanbergen MRN: [id number] DOB: 1951-06-22  Referred by: Midge Minium, MD Reason for referral : No chief complaint on file.   An unsuccessful telephone outreach was attempted today. The patient was referred to the pharmacist for assistance with care management and care coordination.   Follow Up Plan:   Earney Hamburg Upstream Scheduler

## 2020-04-02 ENCOUNTER — Telehealth: Payer: Self-pay | Admitting: Family Medicine

## 2020-04-02 NOTE — Progress Notes (Signed)
  Chronic Care Management   Outreach Note  04/02/2020 Name: Dana Bryan MRN: [id number] DOB: [birth year]  Referred by: Midge Minium, MD Reason for referral : No chief complaint on file.   An unsuccessful telephone outreach was attempted today. The patient was referred to the pharmacist for assistance with care management and care coordination.   Follow Up Plan:   Earney Hamburg Upstream Scheduler

## 2020-06-03 ENCOUNTER — Other Ambulatory Visit: Payer: Self-pay | Admitting: Family Medicine

## 2020-06-12 DIAGNOSIS — L565 Disseminated superficial actinic porokeratosis (DSAP): Secondary | ICD-10-CM | POA: Diagnosis not present

## 2020-06-12 DIAGNOSIS — D485 Neoplasm of uncertain behavior of skin: Secondary | ICD-10-CM | POA: Diagnosis not present

## 2020-06-25 DIAGNOSIS — L565 Disseminated superficial actinic porokeratosis (DSAP): Secondary | ICD-10-CM | POA: Diagnosis not present

## 2020-06-25 DIAGNOSIS — C44629 Squamous cell carcinoma of skin of left upper limb, including shoulder: Secondary | ICD-10-CM | POA: Diagnosis not present

## 2020-06-25 DIAGNOSIS — D485 Neoplasm of uncertain behavior of skin: Secondary | ICD-10-CM | POA: Diagnosis not present

## 2020-06-25 DIAGNOSIS — D0462 Carcinoma in situ of skin of left upper limb, including shoulder: Secondary | ICD-10-CM | POA: Diagnosis not present

## 2020-07-23 ENCOUNTER — Other Ambulatory Visit: Payer: Self-pay | Admitting: Family Medicine

## 2020-07-23 ENCOUNTER — Ambulatory Visit (INDEPENDENT_AMBULATORY_CARE_PROVIDER_SITE_OTHER): Payer: Medicare HMO | Admitting: Family Medicine

## 2020-07-23 ENCOUNTER — Other Ambulatory Visit: Payer: Self-pay

## 2020-07-23 ENCOUNTER — Encounter: Payer: Self-pay | Admitting: Gastroenterology

## 2020-07-23 ENCOUNTER — Encounter: Payer: Self-pay | Admitting: Family Medicine

## 2020-07-23 VITALS — BP 134/89 | HR 76 | Temp 97.1°F | Resp 16 | Ht 64.0 in | Wt 132.0 lb

## 2020-07-23 DIAGNOSIS — F329 Major depressive disorder, single episode, unspecified: Secondary | ICD-10-CM | POA: Diagnosis not present

## 2020-07-23 DIAGNOSIS — R69 Illness, unspecified: Secondary | ICD-10-CM | POA: Diagnosis not present

## 2020-07-23 DIAGNOSIS — E785 Hyperlipidemia, unspecified: Secondary | ICD-10-CM | POA: Diagnosis not present

## 2020-07-23 DIAGNOSIS — F419 Anxiety disorder, unspecified: Secondary | ICD-10-CM

## 2020-07-23 DIAGNOSIS — F32A Depression, unspecified: Secondary | ICD-10-CM

## 2020-07-23 DIAGNOSIS — Z1231 Encounter for screening mammogram for malignant neoplasm of breast: Secondary | ICD-10-CM

## 2020-07-23 LAB — BASIC METABOLIC PANEL
BUN: 13 mg/dL (ref 6–23)
CO2: 31 mEq/L (ref 19–32)
Calcium: 9.2 mg/dL (ref 8.4–10.5)
Chloride: 101 mEq/L (ref 96–112)
Creatinine, Ser: 0.75 mg/dL (ref 0.40–1.20)
GFR: 76.96 mL/min (ref >60.00)
Glucose, Bld: 96 mg/dL (ref 70–99)
Potassium: 4.4 mEq/L (ref 3.5–5.1)
Sodium: 137 mEq/L (ref 135–145)

## 2020-07-23 LAB — HEPATIC FUNCTION PANEL
ALT: 13 U/L (ref 0–35)
AST: 13 U/L (ref 0–37)
Albumin: 4.5 g/dL (ref 3.5–5.2)
Alkaline Phosphatase: 87 U/L (ref 39–117)
Bilirubin, Direct: 0.1 mg/dL (ref 0.0–0.3)
Total Bilirubin: 0.5 mg/dL (ref 0.2–1.2)
Total Protein: 6.9 g/dL (ref 6.0–8.3)

## 2020-07-23 LAB — LIPID PANEL
Cholesterol: 163 mg/dL (ref 0–200)
HDL: 76.2 mg/dL (ref >39.00)
LDL Cholesterol: 74 mg/dL (ref 0–99)
NonHDL: 86.37
Total CHOL/HDL Ratio: 2
Triglycerides: 61 mg/dL (ref 0.0–149.0)
VLDL: 12.2 mg/dL (ref 0.0–40.0)

## 2020-07-23 MED ORDER — BUPROPION HCL ER (XL) 300 MG PO TB24: 300.0000 mg | ORAL_TABLET | Freq: Every day | ORAL | 3 refills | Status: DC

## 2020-07-23 NOTE — Progress Notes (Signed)
   Subjective:    Patient ID: Dana Bryan, female    DOB: Mar 07, 1951, 69 y.o.   MRN: [id number]  HPI Hyperlipidemia- chronic problem, on Lipitor 20mg .  Denies CP, SOB, abd pain, N/V  Anxiety/depression- chronic problem, on Wellbutrin 150mg  daily, Buspar 7.5mg  BID, and Alprazolam prn.  Husband had MI 1 week ago.  New grandchild in January.  'I stay kinda in a funk'.  Pt has done a few counseling sessions but did not find them helpful.  'i'm challenged to find patience on a daily basis'.  'constant aggravation'.    Colonoscopy- pt just needs to schedule   Review of Systems For ROS see HPI   This visit occurred during the SARS-CoV-2 public health emergency.  Safety protocols were in place, including screening questions prior to the visit, additional usage of staff PPE, and extensive cleaning of exam room while observing appropriate contact time as indicated for disinfecting solutions.       Objective:   Physical Exam Vitals reviewed.  Constitutional:      General: She is not in acute distress.    Appearance: Normal appearance. She is well-developed.  HENT:     Head: Normocephalic and atraumatic.  Eyes:     Conjunctiva/sclera: Conjunctivae normal.     Pupils: Pupils are equal, round, and reactive to light.  Neck:     Thyroid: No thyromegaly.  Cardiovascular:     Rate and Rhythm: Normal rate and regular rhythm.     Heart sounds: Normal heart sounds. No murmur heard.   Pulmonary:     Effort: Pulmonary effort is normal. No respiratory distress.     Breath sounds: Normal breath sounds.  Abdominal:     General: There is no distension.     Palpations: Abdomen is soft.     Tenderness: There is no abdominal tenderness.  Musculoskeletal:     Cervical back: Normal range of motion and neck supple.  Lymphadenopathy:     Cervical: No cervical adenopathy.  Skin:    General: Skin is warm and dry.  Neurological:     Mental Status: She is alert and oriented to person, place, and  time.  Psychiatric:        Behavior: Behavior normal.           Assessment & Plan:

## 2020-07-23 NOTE — Assessment & Plan Note (Signed)
Chronic problem.  Tolerating statin w/o difficulty.  Check labs.  Adjust meds prn  

## 2020-07-23 NOTE — Assessment & Plan Note (Signed)
Deteriorated.  Pt's husband had MI last week which has been even more stressful.  She is primary caregiver and he is not a compliant patient.  This is impacting her ability to enjoy her new grandchild.  Increase Wellbutrin to 300mg  daily and monitor closely for improvement.

## 2020-07-23 NOTE — Patient Instructions (Addendum)
Follow up in 6-8 weeks to recheck mood We'll notify you of your lab results and make any changes if needed INCREASE the Wellbutrin to 300mg  daily (2 of what you have at home, 1 of the new prescription) Make sure you take time for yourself!  You deserve it! Call with any questions or concerns Hang in there!!

## 2020-07-24 ENCOUNTER — Encounter: Payer: Self-pay | Admitting: General Practice

## 2020-07-27 ENCOUNTER — Encounter: Payer: Self-pay | Admitting: Family Medicine

## 2020-07-29 MED ORDER — BUPROPION HCL ER (XL) 300 MG PO TB24: 300.0000 mg | ORAL_TABLET | Freq: Every day | ORAL | 1 refills | Status: DC

## 2020-08-21 DIAGNOSIS — R69 Illness, unspecified: Secondary | ICD-10-CM | POA: Diagnosis not present

## 2020-08-27 ENCOUNTER — Encounter: Payer: Self-pay | Admitting: Family Medicine

## 2020-08-28 DIAGNOSIS — N8110 Cystocele, unspecified: Secondary | ICD-10-CM | POA: Diagnosis not present

## 2020-08-28 DIAGNOSIS — N819 Female genital prolapse, unspecified: Secondary | ICD-10-CM | POA: Diagnosis not present

## 2020-08-30 ENCOUNTER — Other Ambulatory Visit: Payer: Self-pay | Admitting: Family Medicine

## 2020-08-30 NOTE — Telephone Encounter (Signed)
Please advise, didn't we just change this dose?

## 2020-09-10 ENCOUNTER — Ambulatory Visit (INDEPENDENT_AMBULATORY_CARE_PROVIDER_SITE_OTHER): Payer: Medicare HMO | Admitting: Family Medicine

## 2020-09-10 ENCOUNTER — Other Ambulatory Visit: Payer: Self-pay

## 2020-09-10 ENCOUNTER — Ambulatory Visit
Admission: RE | Admit: 2020-09-10 | Discharge: 2020-09-10 | Disposition: A | Discharge status: Home / Self Care | Payer: Medicare HMO | Source: Ambulatory Visit | Attending: Family Medicine | Admitting: Family Medicine

## 2020-09-10 ENCOUNTER — Encounter: Payer: Self-pay | Admitting: Family Medicine

## 2020-09-10 VITALS — BP 128/80 | HR 76 | Temp 98.0°F | Resp 16 | Ht 64.0 in | Wt 128.2 lb

## 2020-09-10 DIAGNOSIS — Z7189 Other specified counseling: Secondary | ICD-10-CM | POA: Diagnosis not present

## 2020-09-10 DIAGNOSIS — R69 Illness, unspecified: Secondary | ICD-10-CM | POA: Diagnosis not present

## 2020-09-10 DIAGNOSIS — Z23 Encounter for immunization: Secondary | ICD-10-CM | POA: Diagnosis not present

## 2020-09-10 DIAGNOSIS — Z7185 Encounter for immunization safety counseling: Secondary | ICD-10-CM

## 2020-09-10 DIAGNOSIS — F329 Major depressive disorder, single episode, unspecified: Secondary | ICD-10-CM

## 2020-09-10 DIAGNOSIS — F419 Anxiety disorder, unspecified: Secondary | ICD-10-CM | POA: Diagnosis not present

## 2020-09-10 DIAGNOSIS — Z1231 Encounter for screening mammogram for malignant neoplasm of breast: Secondary | ICD-10-CM | POA: Diagnosis not present

## 2020-09-10 NOTE — Patient Instructions (Signed)
Follow up in Feb to recheck cholesterol Continue the Wellbutrin daily and the Buspar twice daily Plan on getting your COVID booster some time next week Call with any questions or concerns Stay Safe!  Stay Healthy!

## 2020-09-10 NOTE — Assessment & Plan Note (Signed)
Improved w/ increased Wellbutrin dose (now 300mg  daily).  Continue Buspar 7.5mg  twice daily.  Pt is doing well and not having any side effects.  No med changes but will follow.

## 2020-09-10 NOTE — Progress Notes (Signed)
   Subjective:    Patient ID: Dana Bryan, female    DOB: Jan 24, 1951, 69 y.o.   MRN: [id number]  HPI Anxiety/Depression- at last visit Wellbutrin was increased to 300mg  daily.  She also remains on Buspar 7.5mg  BID.  'I definitely think it's helping'.  No longer having heart palpitations.  Less on edge.  Sleeping pretty well- at least 7 hrs/night most nights.  'I think it's as good as it's going to get'.  COVID vaccine- pt is planning to get COVID booster but is unsure of timing.   Review of Systems For ROS see HPI   This visit occurred during the SARS-CoV-2 public health emergency.  Safety protocols were in place, including screening questions prior to the visit, additional usage of staff PPE, and extensive cleaning of exam room while observing appropriate contact time as indicated for disinfecting solutions.       Objective:   Physical Exam Vitals reviewed.  Constitutional:      General: She is not in acute distress.    Appearance: Normal appearance. She is not ill-appearing.  HENT:     Head: Normocephalic and atraumatic.  Skin:    General: Skin is warm and dry.  Neurological:     General: No focal deficit present.     Mental Status: She is alert and oriented to person, place, and time.  Psychiatric:        Mood and Affect: Mood normal.        Behavior: Behavior normal.        Thought Content: Thought content normal.           Assessment & Plan:  COVID vaccine counseling- reviewed new booster guidelines and pt plans on getting her 3rd shot.

## 2020-09-12 ENCOUNTER — Other Ambulatory Visit: Payer: Self-pay | Admitting: Family Medicine

## 2020-09-12 DIAGNOSIS — R928 Other abnormal and inconclusive findings on diagnostic imaging of breast: Secondary | ICD-10-CM

## 2020-09-13 ENCOUNTER — Encounter: Payer: Self-pay | Admitting: Family Medicine

## 2020-09-13 DIAGNOSIS — N8110 Cystocele, unspecified: Secondary | ICD-10-CM | POA: Diagnosis not present

## 2020-09-13 DIAGNOSIS — N819 Female genital prolapse, unspecified: Secondary | ICD-10-CM | POA: Diagnosis not present

## 2020-09-16 ENCOUNTER — Encounter: Payer: Self-pay | Admitting: Family Medicine

## 2020-09-17 DIAGNOSIS — Z20822 Contact with and (suspected) exposure to covid-19: Secondary | ICD-10-CM | POA: Diagnosis not present

## 2020-09-19 ENCOUNTER — Other Ambulatory Visit: Payer: Medicare HMO

## 2020-09-25 ENCOUNTER — Encounter: Payer: Self-pay | Admitting: Gastroenterology

## 2020-09-25 ENCOUNTER — Encounter: Payer: Self-pay | Admitting: General Practice

## 2020-09-27 ENCOUNTER — Ambulatory Visit
Admission: RE | Admit: 2020-09-27 | Discharge: 2020-09-27 | Disposition: A | Discharge status: Home / Self Care | Payer: Medicare HMO | Source: Ambulatory Visit | Attending: Family Medicine | Admitting: Family Medicine

## 2020-09-27 ENCOUNTER — Other Ambulatory Visit: Payer: Self-pay

## 2020-09-27 ENCOUNTER — Other Ambulatory Visit: Payer: Self-pay | Admitting: Family Medicine

## 2020-09-27 DIAGNOSIS — R928 Other abnormal and inconclusive findings on diagnostic imaging of breast: Secondary | ICD-10-CM

## 2020-09-27 DIAGNOSIS — N6489 Other specified disorders of breast: Secondary | ICD-10-CM | POA: Diagnosis not present

## 2020-09-29 ENCOUNTER — Other Ambulatory Visit: Payer: Self-pay | Admitting: Family Medicine

## 2020-10-01 ENCOUNTER — Encounter: Payer: Medicare HMO | Admitting: Gastroenterology

## 2020-10-21 ENCOUNTER — Encounter: Payer: Self-pay | Admitting: Family Medicine

## 2020-10-24 DIAGNOSIS — H5213 Myopia, bilateral: Secondary | ICD-10-CM | POA: Diagnosis not present

## 2020-10-24 DIAGNOSIS — H524 Presbyopia: Secondary | ICD-10-CM | POA: Diagnosis not present

## 2020-10-24 DIAGNOSIS — H25813 Combined forms of age-related cataract, bilateral: Secondary | ICD-10-CM | POA: Diagnosis not present

## 2020-11-07 ENCOUNTER — Other Ambulatory Visit: Payer: Self-pay

## 2020-11-07 ENCOUNTER — Ambulatory Visit (AMBULATORY_SURGERY_CENTER): Payer: Self-pay

## 2020-11-07 VITALS — Ht 64.0 in | Wt 127.0 lb

## 2020-11-07 DIAGNOSIS — Z1211 Encounter for screening for malignant neoplasm of colon: Secondary | ICD-10-CM

## 2020-11-07 MED ORDER — SUTAB 1479-225-188 MG PO TABS: 1.0000 | ORAL_TABLET | ORAL | 0 refills | Status: DC

## 2020-11-07 NOTE — Progress Notes (Signed)
No egg or soy allergy known to patient  No issues with past sedation with any surgeries or procedures No intubation problems in the past  No FH of Malignant Hyperthermia No diet pills per patient No home 02 use per patient  No blood thinners per patient  Pt denies issues with constipation  No A fib or A flutter  EMMI video to pt or via [place] 19 guidelines implemented in [place] today with Pt and RN  Coupon given to pt in [facility] today , Code to Pharmacy  COVID vaccines completed on 02/2020 per pt;  Due to the COVID-19 pandemic we are asking patients to follow these guidelines. Please only bring one care partner. Please be aware that your care partner may wait in the car in the parking lot or if they feel like they will be too hot to wait in the car, they may wait in the lobby on the 4th floor. All care partners are required to wear a mask the entire time (we do not have any that we can provide them), they need to practice social distancing, and we will do a Covid check for all patient's and care partners when you arrive. Also we will check their temperature and your temperature. If the care partner waits in their car they need to stay in the parking lot the entire time and we will call them on their cell phone when the patient is ready for discharge so they can bring the car to the front of the building. Also all patient's will need to wear a mask into building.

## 2020-11-21 ENCOUNTER — Ambulatory Visit (AMBULATORY_SURGERY_CENTER): Payer: Medicare HMO | Admitting: Gastroenterology

## 2020-11-21 ENCOUNTER — Other Ambulatory Visit: Payer: Self-pay

## 2020-11-21 ENCOUNTER — Encounter: Payer: Self-pay | Admitting: Gastroenterology

## 2020-11-21 VITALS — BP 120/68 | HR 65 | Temp 97.1°F | Resp 12 | Ht 64.0 in | Wt 127.0 lb

## 2020-11-21 DIAGNOSIS — D122 Benign neoplasm of ascending colon: Secondary | ICD-10-CM | POA: Diagnosis not present

## 2020-11-21 DIAGNOSIS — D125 Benign neoplasm of sigmoid colon: Secondary | ICD-10-CM

## 2020-11-21 DIAGNOSIS — D123 Benign neoplasm of transverse colon: Secondary | ICD-10-CM

## 2020-11-21 DIAGNOSIS — Z1211 Encounter for screening for malignant neoplasm of colon: Secondary | ICD-10-CM | POA: Diagnosis not present

## 2020-11-21 DIAGNOSIS — Z86010 Personal history of colonic polyps: Secondary | ICD-10-CM | POA: Diagnosis not present

## 2020-11-21 DIAGNOSIS — E7800 Pure hypercholesterolemia, unspecified: Secondary | ICD-10-CM | POA: Diagnosis not present

## 2020-11-21 MED ORDER — SODIUM CHLORIDE 0.9 % IV SOLN: 500.0000 mL | Freq: Once | INTRAVENOUS | Status: DC

## 2020-11-21 NOTE — Progress Notes (Signed)
Report given to PACU, vss 

## 2020-11-21 NOTE — Progress Notes (Signed)
VS- Dana Bryan  Pt's states no medical or surgical changes since previsit or office visit.

## 2020-11-21 NOTE — Op Note (Signed)
[facility] Patient Name: Dana Bryan Procedure Date: 11/21/2020 8:53 AM MRN: [id number] Endoscopist: Remo Lipps P. Havery Moros , MD Age: 69 Referring MD:  Date of Birth: 14-Jan-1951 Gender: Female Account #: [id number] Procedure:                Colonoscopy Indications:              Screening for colorectal malignant neoplasm Medicines:                Monitored Anesthesia Care Procedure:                Pre-Anesthesia Assessment:                           - Prior to the procedure, a History and Physical                            was performed, and patient medications and                            allergies were reviewed. The patient's tolerance of                            previous anesthesia was also reviewed. The risks                            and benefits of the procedure and the sedation                            options and risks were discussed with the patient.                            All questions were answered, and informed consent                            was obtained. Prior Anticoagulants: The patient has                            taken no previous anticoagulant or antiplatelet                            agents. ASA Grade Assessment: II - A patient with                            mild systemic disease. After reviewing the risks                            and benefits, the patient was deemed in                            satisfactory condition to undergo the procedure.                           After obtaining informed consent, the colonoscope  was passed under direct vision. Throughout the                            procedure, the patient's blood pressure, pulse, and                            oxygen saturations were monitored continuously. The                            Colonoscope was introduced through the anus and                            advanced to the the cecum, identified by                            appendiceal orifice  and ileocecal valve. The                            colonoscopy was performed without difficulty. The                            patient tolerated the procedure well. The quality                            of the bowel preparation was good. The ileocecal                            valve, appendiceal orifice, and rectum were                            photographed. Scope In: 9:05:02 AM Scope Out: 9:28:44 AM Scope Withdrawal Time: 0 hours 17 minutes 13 seconds  Total Procedure Duration: 0 hours 23 minutes 42 seconds  Findings:                 The perianal and digital rectal examinations were                            normal.                           A few small-mouthed diverticula were found in the                            sigmoid colon and ascending colon.                           A 5 mm polyp was found in the ascending colon. The                            polyp was flat. The polyp was removed with a cold                            snare. Resection and retrieval were complete.  Two sessile polyps were found in the transverse                            colon. The polyps were 5 mm in size. These polyps                            were removed with a cold snare. Resection and                            retrieval were complete.                           A 4 mm polyp was found in the sigmoid colon. The                            polyp was sessile. The polyp was removed with a                            cold snare. Resection and retrieval were complete.                           Internal hemorrhoids were found during retroflexion.                           The left colon was a bit tortous. The exam was                            otherwise without abnormality. Complications:            No immediate complications. Estimated blood loss:                            Minimal. Estimated Blood Loss:     Estimated blood loss was minimal. Impression:               -  Diverticulosis in the sigmoid colon and in the                            ascending colon.                           - One 5 mm polyp in the ascending colon, removed                            with a cold snare. Resected and retrieved.                           - Two 5 mm polyps in the transverse colon, removed                            with a cold snare. Resected and retrieved.                           - One 4 mm polyp in the  sigmoid colon, removed with                            a cold snare. Resected and retrieved.                           - Internal hemorrhoids.                           - The examination was otherwise normal. Recommendation:           - Patient has a contact number available for                            emergencies. The signs and symptoms of potential                            delayed complications were discussed with the                            patient. Return to normal activities tomorrow.                            Written discharge instructions were provided to the                            patient.                           - Resume previous diet.                           - Continue present medications.                           - Await pathology results. Remo Lipps P. Khyron Garno, MD 11/21/2020 9:36:06 AM This report has been signed electronically.

## 2020-11-21 NOTE — Patient Instructions (Signed)
YOU HAD AN ENDOSCOPIC PROCEDURE TODAY AT THE [facility]:   Refer to the procedure report that was given to you for any specific questions about what was found during the examination.  If the procedure report does not answer your questions, please call your gastroenterologist to clarify.  If you requested that your care partner not be given the details of your procedure findings, then the procedure report has been included in a sealed envelope for you to review at your convenience later.  YOU SHOULD EXPECT: Some feelings of bloating in the abdomen. Passage of more gas than usual.  Walking can help get rid of the air that was put into your GI tract during the procedure and reduce the bloating. If you had a lower endoscopy (such as a colonoscopy or flexible sigmoidoscopy) you may notice spotting of blood in your stool or on the toilet paper. If you underwent a bowel prep for your procedure, you may not have a normal bowel movement for a few days.  Please Note:  You might notice some irritation and congestion in your nose or some drainage.  This is from the oxygen used during your procedure.  There is no need for concern and it should clear up in a day or so.  SYMPTOMS TO REPORT IMMEDIATELY:   Following lower endoscopy (colonoscopy or flexible sigmoidoscopy):  Excessive amounts of blood in the stool  Significant tenderness or worsening of abdominal pains  Swelling of the abdomen that is new, acute  Fever of 100F or higher  For urgent or emergent issues, a gastroenterologist can be reached at any hour by calling [phone]. Do not use MyChart messaging for urgent concerns.    DIET:  We do recommend a small meal at first, but then you may proceed to your regular diet.  Drink plenty of fluids but you should avoid alcoholic beverages for 24 hours.  ACTIVITY:  You should plan to take it easy for the rest of today and you should NOT DRIVE or use heavy machinery until tomorrow (because  of the sedation medicines used during the test).    FOLLOW UP: Our staff will call the number listed on your records 48-72 hours following your procedure to check on you and address any questions or concerns that you may have regarding the information given to you following your procedure. If we do not reach you, we will leave a message.  We will attempt to reach you two times.  During this call, we will ask if you have developed any symptoms of COVID 19. If you develop any symptoms (ie: fever, flu-like symptoms, shortness of breath, cough etc.) before then, please call [phone].  If you test positive for Covid 19 in the 2 weeks post procedure, please call and report this information to us.    If any biopsies were taken you will be contacted by phone or by letter within the next 1-3 weeks.  Please call us at [phone] if you have not heard about the biopsies in 3 weeks.    SIGNATURES/CONFIDENTIALITY: You and/or your care partner have signed paperwork which will be entered into your electronic medical record.  These signatures attest to the fact that that the information above on your After Visit Summary has been reviewed and is understood.  Full responsibility of the confidentiality of this discharge information lies with you and/or your care-partner. 

## 2020-11-25 ENCOUNTER — Telehealth: Payer: Self-pay | Admitting: *Deleted

## 2020-11-25 NOTE — Telephone Encounter (Signed)
  Follow up Call-  Call back number 11/21/2020  Post procedure Call Back phone  # [phone]  Permission to leave phone message Yes  Some recent data might be hidden     Patient questions:  Do you have a fever, pain , or abdominal swelling? No. Pain Score  0 *  Have you tolerated food without any problems? Yes.    Have you been able to return to your normal activities? Yes.    Do you have any questions about your discharge instructions: Diet   No. Medications  No. Follow up visit  No.  Do you have questions or concerns about your Care? No.  Actions: * If pain score is 4 or above: No action needed, pain <4.  1. Have you developed a fever since your procedure? no  2.   Have you had an respiratory symptoms (SOB or cough) since your procedure? no  3.   Have you tested positive for COVID 19 since your procedure no  4.   Have you had any family members/close contacts diagnosed with the COVID 19 since your procedure?  no   If yes to any of these questions please route to Joylene John, RN and Joella Prince, RN

## 2020-11-25 NOTE — Telephone Encounter (Signed)
No answer, left message

## 2020-11-26 DIAGNOSIS — L820 Inflamed seborrheic keratosis: Secondary | ICD-10-CM | POA: Diagnosis not present

## 2020-11-26 DIAGNOSIS — L570 Actinic keratosis: Secondary | ICD-10-CM | POA: Diagnosis not present

## 2020-11-26 DIAGNOSIS — D1801 Hemangioma of skin and subcutaneous tissue: Secondary | ICD-10-CM | POA: Diagnosis not present

## 2020-11-26 DIAGNOSIS — Q828 Other specified congenital malformations of skin: Secondary | ICD-10-CM | POA: Diagnosis not present

## 2020-11-26 DIAGNOSIS — D225 Melanocytic nevi of trunk: Secondary | ICD-10-CM | POA: Diagnosis not present

## 2020-11-26 DIAGNOSIS — L821 Other seborrheic keratosis: Secondary | ICD-10-CM | POA: Diagnosis not present

## 2020-11-26 DIAGNOSIS — D485 Neoplasm of uncertain behavior of skin: Secondary | ICD-10-CM | POA: Diagnosis not present

## 2020-11-26 DIAGNOSIS — L814 Other melanin hyperpigmentation: Secondary | ICD-10-CM | POA: Diagnosis not present

## 2020-11-26 DIAGNOSIS — L565 Disseminated superficial actinic porokeratosis (DSAP): Secondary | ICD-10-CM | POA: Diagnosis not present

## 2020-12-26 DIAGNOSIS — Z03818 Encounter for observation for suspected exposure to other biological agents ruled out: Secondary | ICD-10-CM | POA: Diagnosis not present

## 2020-12-26 DIAGNOSIS — Z20822 Contact with and (suspected) exposure to covid-19: Secondary | ICD-10-CM | POA: Diagnosis not present

## 2020-12-27 DIAGNOSIS — Z20822 Contact with and (suspected) exposure to covid-19: Secondary | ICD-10-CM | POA: Diagnosis not present

## 2020-12-29 ENCOUNTER — Encounter: Payer: Self-pay | Admitting: Family Medicine

## 2021-01-24 NOTE — Progress Notes (Signed)
Subjective:   Dana Bryan is a 70 y.o. female who presents for Medicare Annual (Subsequent) preventive examination.  I connected with Breyon today by telephone and verified that I am speaking with the correct person using two identifiers. Location patient: home Location provider: work Persons participating in the virtual visit: patient, Marine scientist.    I discussed the limitations, risks, security and privacy concerns of performing an evaluation and management service by telephone and the availability of in person appointments. I also discussed with the patient that there may be a patient responsible charge related to this service. The patient expressed understanding and verbally consented to this telephonic visit.    Interactive audio and video telecommunications were attempted between this provider and patient, however failed, due to patient having technical difficulties OR patient did not have access to video capability.  We continued and completed visit with audio only.  Some vital signs may be absent or patient reported.   Time Spent with patient on telephone encounter: 30 minutes   Review of Systems     Cardiac Risk Factors include: advanced age (>42men, >51 women);dyslipidemia     Objective:    Today's Vitals   01/27/21 1329 01/27/21 1330  Weight: 125 lb (56.7 kg)   Height: 5\' 4"  (1.626 m)   PainSc:  4    Body mass index is 21.46 kg/m.  Advanced Directives 01/27/2021 03/21/2019  Does Patient Have a Medical Advance Directive? Yes No  Type of Paramedic of [place];Living will -  Copy of [place] in Chart? Yes - validated most recent copy scanned in chart (See row information) -  Would patient like information on creating a medical advance directive? - No - Patient declined    Current Medications (verified) Outpatient Encounter Medications as of 01/27/2021  Medication Sig  . ALPRAZolam (XANAX) 0.25 MG tablet Take 1 tablet  (0.25 mg total) by mouth daily as needed.  . Ascorbic Acid (VITAMIN C PO) Take 500 mg by mouth daily.  Marland Kitchen atorvastatin (LIPITOR) 20 MG tablet TAKE 1 TABLET BY MOUTH EVERY DAY  . buPROPion (WELLBUTRIN XL) 300 MG 24 hr tablet Take 1 tablet (300 mg total) by mouth daily.  . busPIRone (BUSPAR) 7.5 MG tablet TAKE 1 TABLET (7.5 MG TOTAL) BY MOUTH 2 (TWO) TIMES DAILY.  . Calcium Carb-Cholecalciferol (CALCIUM+D3 PO) Take 600 mg by mouth daily.  . Cholecalciferol (VITAMIN D3 PO) Take 2,000 Units by mouth.  Marland Kitchen ibuprofen (ADVIL,MOTRIN) 200 MG tablet Take 400 mg by mouth daily as needed for headache.  . zinc gluconate 50 MG tablet Take 50 mg by mouth daily.  . [DISCONTINUED] aspirin EC 81 MG EC tablet Take 1 tablet (81 mg total) by mouth daily. (Patient not taking: Reported on 11/21/2020)  . [DISCONTINUED] Multiple Vitamins-Minerals (ZINC PO) Take 30 mg by mouth daily.   No facility-administered encounter medications on file as of 01/27/2021.    Allergies (verified) Chlorhexidine gluconate   History: Past Medical History:  Diagnosis Date  . Anxiety    on meds  . Basal cell carcinoma   . Cancer ([place])   . Cataract    no sx   . Generalized headaches   . Heart murmur    per Dr.Tabori  . Hyperlipidemia    on meds  . Reactive depression (situational)    on meds  . Squamous cell skin cancer, finger   . Vasomotor rhinitis    Past Surgical History:  Procedure Laterality Date  . COLONOSCOPY  2008   normal   . TONSILLECTOMY  1973   Family History  Problem Relation Age of Onset  . Alcohol abuse Father   . Hypertension Mother   . Colon polyps Neg Hx   . Colon cancer Neg Hx   . Esophageal cancer Neg Hx   . Stomach cancer Neg Hx   . Rectal cancer Neg Hx    Social History   Socioeconomic History  . Marital status: Married    Spouse name: Not on file  . Number of children: Not on file  . Years of education: Not on file  . Highest education level: Not on file  Occupational History  . Not  on file  Tobacco Use  . Smoking status: Former Smoker    Types: Cigarettes    Quit date: 01/22/2004    Years since quitting: 17.0  . Smokeless tobacco: Never Used  Vaping Use  . Vaping Use: Never used  Substance and Sexual Activity  . Alcohol use: Yes    Alcohol/week: 15.0 standard drinks    Types: 15 Standard drinks or equivalent per week    Comment: drinks 1-2 drinks daily  . Drug use: Never  . Sexual activity: Not on file  Other Topics Concern  . Not on file  Social History Narrative   HSG, college Grad. Married '71. 2 Daughters- '77, '81. 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 Difficulty of Paying Living Expenses: Not hard at all  Food Insecurity: No Food Insecurity  . Worried About Charity fundraiser in the Last Year: Never true  . Ran Out of Food in the Last Year: Never true  Transportation Needs: No Transportation Needs  . Lack of Transportation (Medical): No  . Lack of Transportation (Non-Medical): No  Physical Activity: Inactive  . Days of Exercise per Week: 0 days  . Minutes of Exercise per Session: 0 min  Stress: No Stress Concern Present  . Feeling of Stress : Only a little  Social Connections: Moderately Isolated  . Frequency of Communication with Friends and Family: More than three times a week  . Frequency of Social Gatherings with Friends and Family: More than three times a week  . Attends Religious Services: Never  . Active Member of Clubs or Organizations: No  . Attends Archivist Meetings: Never  . Marital Status: Married    Tobacco Counseling Counseling given: Not Answered   Clinical Intake:  Pre-visit preparation completed: Yes  Pain : 0-10 Pain Score: 4  Pain Type: Chronic pain Pain Location: Foot (corn on left foot) Pain Orientation: Left Pain Onset: More than a month ago Pain  Frequency: Intermittent     Nutritional Status: BMI of 19-24  Normal Nutritional Risks: Other (Comment) Diabetes: No  How often do you need to have someone help you when you read instructions, pamphlets, or other written materials from your doctor or pharmacy?: 1 - Never  Diabetic?No  Interpreter Needed?: No  Information entered by :: Caroleen Hamman LPN   Activities of Daily Living In your present state of health, do you have any difficulty performing the following activities: 01/27/2021 09/10/2020  Hearing? N N  Vision? N N  Difficulty concentrating or making decisions? Y N  Comment occasionally -  Walking or climbing stairs? N N  Dressing or bathing? N N  Doing errands, shopping? N N  Preparing Food and eating ? N -  Using the Toilet? N -  In the past six months, have you accidently leaked urine? Y -  Comment weras a pessary -  Do you have problems with loss of bowel control? N -  Managing your Medications? N -  Managing your Finances? N -  Housekeeping or managing your Housekeeping? N -  Some recent data might be hidden    Patient Care Team: Midge Minium, MD as PCP - General (Family Medicine) Paula Compton, MD as Consulting Physician (Obstetrics and Gynecology) Havery Moros, Carlota Raspberry, MD as Consulting Physician (Gastroenterology)  Indicate any recent Medical Services you may have received from other than [facility] providers in the past year (date may be approximate).     Assessment:   This is a routine wellness examination for Dana Bryan.  Hearing/Vision screen  Hearing Screening   125Hz  250Hz  500Hz  1000Hz  2000Hz  3000Hz  4000Hz  6000Hz  8000Hz   Right ear:           Left ear:           Comments: No issues  Vision Screening Comments: Wears glasses Last eye exam-09/2020-Dr. Gershon Crane  Dietary issues and exercise activities discussed: Current Exercise Habits: The patient does not participate in regular exercise at present, Exercise limited by: None  identified  Goals    . Patient Stated     Increase activity      Depression Screen PHQ 2/9 Scores 01/27/2021 09/10/2020 07/23/2020 01/23/2020 06/26/2019 03/24/2019 01/06/2019  PHQ - 2 Score 1 1 0 2 0 0 1  PHQ- 9 Score - 2 0 2 0 - -    Fall Risk Fall Risk  01/27/2021 09/10/2020 07/23/2020 01/23/2020 06/26/2019  Falls in the past year? 0 0 0 0 0  Number falls in past yr: 0 0 0 0 0  Injury with Fall? 0 0 0 0 0  Follow up Falls prevention discussed Falls evaluation completed Falls evaluation completed Falls evaluation completed -    FALL RISK PREVENTION PERTAINING TO THE HOME:  Any stairs in or around the home? No  Home free of loose throw rugs in walkways, pet beds, electrical cords, etc? Yes  Adequate lighting in your home to reduce risk of falls? Yes   ASSISTIVE DEVICES UTILIZED TO PREVENT FALLS:  Life alert? No  Use of a cane, walker or w/c? No  Grab bars in the bathroom? Yes  Shower chair or bench in shower? No  Elevated toilet seat or a handicapped toilet? No   TIMED UP AND GO:  Was the test performed? No . Phone visit   Cognitive Function:Normal cognitive status assessed by  this Nurse Health Advisor. No abnormalities found.          Immunizations Immunization History  Administered Date(s) Administered  . Fluad Quad(high Dose 65+) 09/10/2020  . Influenza Whole 10/12/2008, 09/16/2009, 07/28/2012  . Influenza, High Dose Seasonal PF 09/18/2019  . Influenza,inj,Quad PF,6+ Mos 08/31/2014, 10/26/2017  . Influenza-Unspecified 09/28/2013, 10/05/2015, 10/31/2018  . PFIZER(Purple Top)SARS-COV-2 Vaccination 02/11/2020, 03/06/2020, 10/19/2020  . Pneumococcal Conjugate-13 01/06/2019  . Tdap 01/07/2016  . Zoster 11/08/2014  . Zoster Recombinat (Shingrix) 07/21/2019, 10/21/2019    TDAP status: Up to date  Flu Vaccine status: Up to date  Pneumococcal vaccine status: Due, Education has been provided regarding the importance of this vaccine. Advised may receive this vaccine at local  pharmacy or Health Dept. Aware to provide a copy of the vaccination record if obtained from local pharmacy  or Health Dept. Verbalized acceptance and understanding.  Covid-19 vaccine status: Completed vaccines  Qualifies for Shingles Vaccine? No   Zostavax completed Yes   Shingrix Completed?: Yes  Screening Tests Health Maintenance  Topic Date Due  . PNA vac Low Risk Adult (2 of 2 - PPSV23) 01/07/2020  . MAMMOGRAM  09/10/2022  . COLONOSCOPY (Pts 45-68yrs Insurance coverage will need to be confirmed)  11/22/2023  . TETANUS/TDAP  01/06/2026  . INFLUENZA VACCINE  Completed  . DEXA SCAN  Completed  . COVID-19 Vaccine  Completed  . Hepatitis C Screening  Completed    Health Maintenance  Health Maintenance Due  Topic Date Due  . PNA vac Low Risk Adult (2 of 2 - PPSV23) 01/07/2020    Colorectal cancer screening: Type of screening: Colonoscopy. Completed 11/21/2020. Repeat every 3 years  Mammogram status: Completed 09/10/2020-Bilateral. Repeat every year  Bone Density status: Completed 02/13/2020. Results reflect: Bone density results: OSTEOPENIA. Repeat every 2 years.  Lung Cancer Screening: (Low Dose CT Chest recommended if Age 9-80 years, 30 pack-year currently smoking OR have quit w/in 15years.) does not qualify.     Additional Screening:  Hepatitis C Screening:  Completed 12/09/2017  Vision Screening: Recommended annual ophthalmology exams for early detection of glaucoma and other disorders of the eye. Is the patient up to date with their annual eye exam?  Yes  Who is the provider or what is the name of the office in which the patient attends annual eye exams? Dr. Gershon Crane  Dental Screening: Recommended annual dental exams for proper oral hygiene  Community Resource Referral / Chronic Care Management: CRR required this visit?  No   CCM required this visit?  No      Plan:     I have personally reviewed and noted the following in the patient's chart:   . Medical and  social history . Use of alcohol, tobacco or illicit drugs  . Current medications and supplements . Functional ability and status . Nutritional status . Physical activity . Advanced directives . List of other physicians . Hospitalizations, surgeries, and ER visits in previous 12 months . Vitals . Screenings to include cognitive, depression, and falls . Referrals and appointments  In addition, I have reviewed and discussed with patient certain preventive protocols, quality metrics, and best practice recommendations. A written personalized care plan for preventive services as well as general preventive health recommendations were provided to patient.   Due to this being a telephonic visit, the after visit summary with patients personalized plan was offered to patient via mail or my-chart.  Patient would like to access on my-chart.  Dana Antu, LPN   [id number]  Nurse Health Advisor  Nurse Notes: None

## 2021-01-27 ENCOUNTER — Ambulatory Visit (INDEPENDENT_AMBULATORY_CARE_PROVIDER_SITE_OTHER): Payer: Medicare HMO

## 2021-01-27 VITALS — Ht 64.0 in | Wt 125.0 lb

## 2021-01-27 DIAGNOSIS — Z0000 Encounter for general adult medical examination without abnormal findings: Secondary | ICD-10-CM

## 2021-01-27 NOTE — Patient Instructions (Signed)
[name] , Thank you for taking time to complete your Medicare Wellness Visit. I appreciate your ongoing commitment to your health goals. Please review the following plan we discussed and let me know if I can assist you in the future.   Screening recommendations/referrals: Colonoscopy: Completed 11/21/2020-Due 11/22/2023 Mammogram: Completed 09/10/2020-Due 09/10/2021 Bone Density: Completed 02/13/2020-Due 02/12/2022 Recommended yearly ophthalmology/optometry visit for glaucoma screening and checkup Recommended yearly dental visit for hygiene and checkup  Vaccinations: Influenza vaccine: Up to date Pneumococcal vaccine: Due for Pneumovax-23-May obtain vaccine at our office or your local pharmacy. Tdap vaccine: Up to date-Due-01/06/2026 Shingles vaccine: Completed vaccines   Covid-19:Completed vaccines  Advanced directives: Please bring a copy for your chart  Conditions/risks identified: See problem list  Next appointment: Follow up in one year for your annual wellness visit    Preventive Care 65 Years and Older, Female Preventive care refers to lifestyle choices and visits with your health care provider that can promote health and wellness. What does preventive care include?  A yearly physical exam. This is also called an annual well check.  Dental exams once or twice a year.  Routine eye exams. Ask your health care provider how often you should have your eyes checked.  Personal lifestyle choices, including:  Daily care of your teeth and gums.  Regular physical activity.  Eating a healthy diet.  Avoiding tobacco and drug use.  Limiting alcohol use.  Practicing safe sex.  Taking low-dose aspirin every day.  Taking vitamin and mineral supplements as recommended by your health care provider. What happens during an annual well check? The services and screenings done by your health care provider during your annual well check will depend on your age, overall health, lifestyle  risk factors, and family history of disease. Counseling  Your health care provider may ask you questions about your:  Alcohol use.  Tobacco use.  Drug use.  Emotional well-being.  Home and relationship well-being.  Sexual activity.  Eating habits.  History of falls.  Memory and ability to understand (cognition).  Work and work Statistician.  Reproductive health. Screening  You may have the following tests or measurements:  Height, weight, and BMI.  Blood pressure.  Lipid and cholesterol levels. These may be checked every 5 years, or more frequently if you are over 42 years old.  Skin check.  Lung cancer screening. You may have this screening every year starting at age 64 if you have a 30-pack-year history of smoking and currently smoke or have quit within the past 15 years.  Fecal occult blood test (FOBT) of the stool. You may have this test every year starting at age 58.  Flexible sigmoidoscopy or colonoscopy. You may have a sigmoidoscopy every 5 years or a colonoscopy every 10 years starting at age 87.  Hepatitis C blood test.  Hepatitis B blood test.  Sexually transmitted disease (STD) testing.  Diabetes screening. This is done by checking your blood sugar (glucose) after you have not eaten for a while (fasting). You may have this done every 1-3 years.  Bone density scan. This is done to screen for osteoporosis. You may have this done starting at age 53.  Mammogram. This may be done every 1-2 years. Talk to your health care provider about how often you should have regular mammograms. Talk with your health care provider about your test results, treatment options, and if necessary, the need for more tests. Vaccines  Your health care provider may recommend certain vaccines, such as:  Influenza  vaccine. This is recommended every year.  Tetanus, diphtheria, and acellular pertussis (Tdap, Td) vaccine. You may need a Td booster every 10 years.  Zoster vaccine.  You may need this after age 23.  Pneumococcal 13-valent conjugate (PCV13) vaccine. One dose is recommended after age 55.  Pneumococcal polysaccharide (PPSV23) vaccine. One dose is recommended after age 2. Talk to your health care provider about which screenings and vaccines you need and how often you need them. This information is not intended to replace advice given to you by your health care provider. Make sure you discuss any questions you have with your health care provider. Document Released: 01/03/2016 Document Revised: 08/26/2016 Document Reviewed: 10/08/2015 Elsevier Interactive Patient Education  2017 [place] Prevention in the Home Falls can cause injuries. They can happen to people of all ages. There are many things you can do to make your home safe and to help prevent falls. What can I do on the outside of my home?  Regularly fix the edges of walkways and driveways and fix any cracks.  Remove anything that might make you trip as you walk through a door, such as a raised step or threshold.  Trim any bushes or trees on the path to your home.  Use bright outdoor lighting.  Clear any walking paths of anything that might make someone trip, such as rocks or tools.  Regularly check to see if handrails are loose or broken. Make sure that both sides of any steps have handrails.  Any raised decks and porches should have guardrails on the edges.  Have any leaves, snow, or ice cleared regularly.  Use sand or salt on walking paths during winter.  Clean up any spills in your garage right away. This includes oil or grease spills. What can I do in the bathroom?  Use night lights.  Install grab bars by the toilet and in the tub and shower. Do not use towel bars as grab bars.  Use non-skid mats or decals in the tub or shower.  If you need to sit down in the shower, use a plastic, non-slip stool.  Keep the floor dry. Clean up any water that spills on the floor as soon  as it happens.  Remove soap buildup in the tub or shower regularly.  Attach bath mats securely with double-sided non-slip rug tape.  Do not have throw rugs and other things on the floor that can make you trip. What can I do in the bedroom?  Use night lights.  Make sure that you have a light by your bed that is easy to reach.  Do not use any sheets or blankets that are too big for your bed. They should not hang down onto the floor.  Have a firm chair that has side arms. You can use this for support while you get dressed.  Do not have throw rugs and other things on the floor that can make you trip. What can I do in the kitchen?  Clean up any spills right away.  Avoid walking on wet floors.  Keep items that you use a lot in easy-to-reach places.  If you need to reach something above you, use a strong step stool that has a grab bar.  Keep electrical cords out of the way.  Do not use floor polish or wax that makes floors slippery. If you must use wax, use non-skid floor wax.  Do not have throw rugs and other things on the floor that can  make you trip. What can I do with my stairs?  Do not leave any items on the stairs.  Make sure that there are handrails on both sides of the stairs and use them. Fix handrails that are broken or loose. Make sure that handrails are as long as the stairways.  Check any carpeting to make sure that it is firmly attached to the stairs. Fix any carpet that is loose or worn.  Avoid having throw rugs at the top or bottom of the stairs. If you do have throw rugs, attach them to the floor with carpet tape.  Make sure that you have a light switch at the top of the stairs and the bottom of the stairs. If you do not have them, ask someone to add them for you. What else can I do to help prevent falls?  Wear shoes that:  Do not have high heels.  Have rubber bottoms.  Are comfortable and fit you well.  Are closed at the toe. Do not wear sandals.  If  you use a stepladder:  Make sure that it is fully opened. Do not climb a closed stepladder.  Make sure that both sides of the stepladder are locked into place.  Ask someone to hold it for you, if possible.  Clearly mark and make sure that you can see:  Any grab bars or handrails.  First and last steps.  Where the edge of each step is.  Use tools that help you move around (mobility aids) if they are needed. These include:  Canes.  Walkers.  Scooters.  Crutches.  Turn on the lights when you go into a dark area. Replace any light bulbs as soon as they burn out.  Set up your furniture so you have a clear path. Avoid moving your furniture around.  If any of your floors are uneven, fix them.  If there are any pets around you, be aware of where they are.  Review your medicines with your doctor. Some medicines can make you feel dizzy. This can increase your chance of falling. Ask your doctor what other things that you can do to help prevent falls. This information is not intended to replace advice given to you by your health care provider. Make sure you discuss any questions you have with your health care provider. Document Released: 10/03/2009 Document Revised: 05/14/2016 Document Reviewed: 01/11/2015 Elsevier Interactive Patient Education  2017 [employer].

## 2021-02-05 ENCOUNTER — Encounter: Payer: Self-pay | Admitting: Family Medicine

## 2021-02-05 ENCOUNTER — Telehealth (INDEPENDENT_AMBULATORY_CARE_PROVIDER_SITE_OTHER): Payer: Medicare HMO | Admitting: Family Medicine

## 2021-02-05 ENCOUNTER — Other Ambulatory Visit: Payer: Self-pay

## 2021-02-05 DIAGNOSIS — J029 Acute pharyngitis, unspecified: Secondary | ICD-10-CM

## 2021-02-05 MED ORDER — AMOXICILLIN 500 MG PO CAPS: 1000.0000 mg | ORAL_CAPSULE | Freq: Every day | ORAL | 0 refills | Status: AC

## 2021-02-05 NOTE — Progress Notes (Signed)
Chief Complaint  Patient presents with  . Sore Throat    Nasal drainage    [name] here for URI complaints. Due to COVID-19 pandemic, we are interacting via web portal for an electronic face-to-face visit. I verified patient's ID using 2 identifiers. Patient agreed to proceed with visit via this method. Patient is at home, I am at office. Patient and I are present for visit.   Duration: 1 week  Associated symptoms: sore throat and sinus drainage, some nausea from the drainage Denies: sinus congestion, sinus pain, rhinorrhea, itchy watery eyes, ear drainage, wheezing, shortness of breath, myalgia and fevers, V/D, coughing Treatment to date: throat lozenges, honey, Netty Pot, Sudafed Sick contacts: No  She is triple vaccinated.   Past Medical History:  Diagnosis Date  . Anxiety    on meds  . Basal cell carcinoma   . Cancer ([place])   . Cataract    no sx   . Generalized headaches   . Heart murmur    per Dr.Tabori  . Hyperlipidemia    on meds  . Reactive depression (situational)    on meds  . Squamous cell skin cancer, finger   . Vasomotor rhinitis    Exam No conversational dyspnea Age appropriate judgment and insight Nml affect and mood She tried to feel for lymph nodes in her neck, did not feel any, no pain.  Husband saw white patches in throat   Sore throat  This is tough since I can't see in her throat. Given duration and presence of patches, lack of cough, will tx. Start PO antihist. Send message Fri if no better, would consider pred burst to dry her up further.  F/u prn. If starting to experience fevers, shaking, or shortness of breath, seek immediate care. Pt voiced understanding and agreement to the plan.  [place], DO 02/05/21 2:32 PM

## 2021-02-07 ENCOUNTER — Other Ambulatory Visit: Payer: Self-pay

## 2021-02-07 ENCOUNTER — Encounter: Payer: Self-pay | Admitting: Family Medicine

## 2021-02-07 ENCOUNTER — Ambulatory Visit (INDEPENDENT_AMBULATORY_CARE_PROVIDER_SITE_OTHER): Payer: Medicare HMO | Admitting: Family Medicine

## 2021-02-07 VITALS — BP 120/78 | HR 72 | Temp 97.8°F | Resp 17 | Ht 64.0 in | Wt 126.4 lb

## 2021-02-07 DIAGNOSIS — E785 Hyperlipidemia, unspecified: Secondary | ICD-10-CM

## 2021-02-07 DIAGNOSIS — R7989 Other specified abnormal findings of blood chemistry: Secondary | ICD-10-CM | POA: Diagnosis not present

## 2021-02-07 DIAGNOSIS — F419 Anxiety disorder, unspecified: Secondary | ICD-10-CM

## 2021-02-07 DIAGNOSIS — F32A Depression, unspecified: Secondary | ICD-10-CM

## 2021-02-07 DIAGNOSIS — R69 Illness, unspecified: Secondary | ICD-10-CM | POA: Diagnosis not present

## 2021-02-07 DIAGNOSIS — Z23 Encounter for immunization: Secondary | ICD-10-CM | POA: Diagnosis not present

## 2021-02-07 DIAGNOSIS — L84 Corns and callosities: Secondary | ICD-10-CM

## 2021-02-07 LAB — HEPATIC FUNCTION PANEL
ALT: 15 U/L (ref 0–35)
AST: 16 U/L (ref 0–37)
Albumin: 4 g/dL (ref 3.5–5.2)
Alkaline Phosphatase: 92 U/L (ref 39–117)
Bilirubin, Direct: 0 mg/dL (ref 0.0–0.3)
Total Bilirubin: 0.4 mg/dL (ref 0.2–1.2)
Total Protein: 7 g/dL (ref 6.0–8.3)

## 2021-02-07 LAB — BASIC METABOLIC PANEL
BUN: 13 mg/dL (ref 6–23)
CO2: 28 mEq/L (ref 19–32)
Calcium: 9 mg/dL (ref 8.4–10.5)
Chloride: 102 mEq/L (ref 96–112)
Creatinine, Ser: 0.75 mg/dL (ref 0.40–1.20)
GFR: 82.01 mL/min (ref >60.00)
Glucose, Bld: 88 mg/dL (ref 70–99)
Potassium: 3.8 mEq/L (ref 3.5–5.1)
Sodium: 138 mEq/L (ref 135–145)

## 2021-02-07 LAB — LIPID PANEL
Cholesterol: 143 mg/dL (ref 0–200)
HDL: 73.5 mg/dL (ref >39.00)
LDL Cholesterol: 58 mg/dL (ref 0–99)
NonHDL: 69.12
Total CHOL/HDL Ratio: 2
Triglycerides: 55 mg/dL (ref 0.0–149.0)
VLDL: 11 mg/dL (ref 0.0–40.0)

## 2021-02-07 LAB — TSH: TSH: 4.07 u[IU]/mL (ref 0.35–4.50)

## 2021-02-07 NOTE — Addendum Note (Signed)
Addended by: Genevie Cheshire L on: 02/07/2021 09:17 AM   Modules accepted: Orders

## 2021-02-07 NOTE — Assessment & Plan Note (Signed)
Chronic problem.  On Lipitor 20mg daily w/o difficulty.  Check labs.  Adjust meds prn  

## 2021-02-07 NOTE — Assessment & Plan Note (Signed)
Ongoing issue.  Pt feels mood is stable.  No changes at this time.  Will follow.

## 2021-02-07 NOTE — Progress Notes (Signed)
   Subjective:    Patient ID: Dana Bryan, female    DOB: 03/24/1951, 70 y.o.   MRN: [id number]  HPI Hyperlipidemia- chronic problem, on Lipitor 20mg  daily.  Denies CP, SOB, abd pain, N/V  Anxiety/Depression- ongoing issue.  On Wellbutrin 300mg  daily, Buspar 7.5mg  BID, and Alprazolam prn.  Pt reports mood is 'fairly good'.  Admits it is situational and centers mostly around her husband and his unwillingness to take care of himself.  Foot lesion- L foot, bottom, lateral.  Hard lump.  Painful to walk on.   Review of Systems For ROS see HPI   This visit occurred during the SARS-CoV-2 public health emergency.  Safety protocols were in place, including screening questions prior to the visit, additional usage of staff PPE, and extensive cleaning of exam room while observing appropriate contact time as indicated for disinfecting solutions.       Objective:   Physical Exam Vitals reviewed.  Constitutional:      General: She is not in acute distress.    Appearance: Normal appearance. She is well-developed and well-nourished. She is not ill-appearing.  HENT:     Head: Normocephalic and atraumatic.  Eyes:     Extraocular Movements: EOM normal.     Conjunctiva/sclera: Conjunctivae normal.     Pupils: Pupils are equal, round, and reactive to light.  Neck:     Thyroid: No thyromegaly.  Cardiovascular:     Rate and Rhythm: Normal rate and regular rhythm.     Pulses: Intact distal pulses.     Heart sounds: Normal heart sounds. No murmur heard.   Pulmonary:     Effort: Pulmonary effort is normal. No respiratory distress.     Breath sounds: Normal breath sounds.  Abdominal:     General: There is no distension.     Palpations: Abdomen is soft.     Tenderness: There is no abdominal tenderness.  Musculoskeletal:        General: No edema.     Cervical back: Normal range of motion and neck supple.     Right lower leg: No edema.     Left lower leg: No edema.  Lymphadenopathy:      Cervical: No cervical adenopathy.  Skin:    General: Skin is warm and dry.     Comments: Corn on L lateral plantar surface  Neurological:     Mental Status: She is alert and oriented to person, place, and time.  Psychiatric:        Mood and Affect: Mood and affect normal.        Behavior: Behavior normal.           Assessment & Plan:  Corn- new.  Reviewed dx and treatment w/ pt.  She will start OTC corn pads

## 2021-02-07 NOTE — Patient Instructions (Signed)
Schedule your complete physical in 6 months We'll notify you of your lab results and make any changes if needed Use a pumice stone to file the rough edges of the corn and then use OTC corn pads Continue to work on healthy diet and regular exercise- you look great! Call with any questions or concerns Stay Safe!  Stay Healthy!

## 2021-02-13 DIAGNOSIS — H524 Presbyopia: Secondary | ICD-10-CM | POA: Diagnosis not present

## 2021-02-13 DIAGNOSIS — Z0100 Encounter for examination of eyes and vision without abnormal findings: Secondary | ICD-10-CM | POA: Diagnosis not present

## 2021-03-07 ENCOUNTER — Other Ambulatory Visit: Payer: Self-pay | Admitting: Family Medicine

## 2021-04-28 ENCOUNTER — Other Ambulatory Visit: Payer: Self-pay

## 2021-04-28 ENCOUNTER — Encounter: Payer: Self-pay | Admitting: Family Medicine

## 2021-04-28 DIAGNOSIS — Z960 Presence of urogenital implants: Secondary | ICD-10-CM | POA: Diagnosis not present

## 2021-04-28 DIAGNOSIS — N8110 Cystocele, unspecified: Secondary | ICD-10-CM | POA: Diagnosis not present

## 2021-04-28 DIAGNOSIS — N819 Female genital prolapse, unspecified: Secondary | ICD-10-CM | POA: Diagnosis not present

## 2021-04-28 MED ORDER — BUPROPION HCL ER (XL) 300 MG PO TB24
300.0000 mg | ORAL_TABLET | Freq: Every day | ORAL | 1 refills | Status: DC
Start: 2021-04-28 — End: 2021-11-04

## 2021-06-18 ENCOUNTER — Encounter: Payer: Self-pay | Admitting: *Deleted

## 2021-07-09 ENCOUNTER — Other Ambulatory Visit: Payer: Self-pay | Admitting: Family Medicine

## 2021-07-28 ENCOUNTER — Telehealth: Payer: Self-pay | Admitting: Family Medicine

## 2021-07-28 NOTE — Chronic Care Management (AMB) (Signed)
  Chronic Care Management   Outreach Note  07/28/2021 Name: Dana Bryan MRN: [id number] DOB: 03-28-1951  Referred by: Midge Minium, MD Reason for referral : No chief complaint on file.   An unsuccessful telephone outreach was attempted today. The patient was referred to the pharmacist for assistance with care management and care coordination.   Follow Up Plan:   Tatjana Dellinger Upstream Scheduler

## 2021-07-30 DIAGNOSIS — D1801 Hemangioma of skin and subcutaneous tissue: Secondary | ICD-10-CM | POA: Diagnosis not present

## 2021-07-30 DIAGNOSIS — Q828 Other specified congenital malformations of skin: Secondary | ICD-10-CM | POA: Diagnosis not present

## 2021-07-30 DIAGNOSIS — D485 Neoplasm of uncertain behavior of skin: Secondary | ICD-10-CM | POA: Diagnosis not present

## 2021-08-04 ENCOUNTER — Telehealth: Payer: Self-pay | Admitting: Family Medicine

## 2021-08-04 NOTE — Chronic Care Management (AMB) (Signed)
  Chronic Care Management   Note  08/04/2021 Name: Dana Bryan MRN: [id number] DOB: July 16, 1951  Dana Bryan is a 70 y.o. year old female who is a primary care patient of Birdie Riddle, Aundra Millet, MD. I reached out to [name] by phone today in response to a referral sent by [name]'s PCP, Midge Minium, MD.   Ms. Bryan was given information about Chronic Care Management services today including:  CCM service includes personalized support from designated clinical staff supervised by her physician, including individualized plan of care and coordination with other care providers 24/7 contact phone numbers for assistance for urgent and routine care needs. Service will only be billed when office clinical staff spend 20 minutes or more in a month to coordinate care. Only one practitioner may furnish and bill the service in a calendar month. The patient may stop CCM services at any time (effective at the end of the month) by phone call to the office staff.   Patient agreed to services and verbal consent obtained.   Follow up plan:   Tatjana Secretary/administrator

## 2021-08-08 ENCOUNTER — Encounter: Payer: Medicare HMO | Admitting: Family Medicine

## 2021-08-15 ENCOUNTER — Other Ambulatory Visit: Payer: Self-pay | Admitting: Family Medicine

## 2021-08-15 DIAGNOSIS — Z1231 Encounter for screening mammogram for malignant neoplasm of breast: Secondary | ICD-10-CM

## 2021-08-29 ENCOUNTER — Telehealth: Payer: Self-pay

## 2021-08-29 NOTE — Progress Notes (Signed)
    Chronic Care Management Pharmacy Assistant   Name: Dana Bryan  MRN: [id number] DOB: 01-06-1951  Dana Bryan is an 70 y.o. year old female who presents for her initial CCM visit with the clinical pharmacist.  Reason for Encounter: Chart Prep / Initial CCM Visit   Recent office visits:  None noted.   Recent consult visits:  04/28/21 Paula Compton - OB GYN - Cystocele - No notes available.  03/12/21 Rutherford Guys, MD - Ophthalmology - Myopia - Eye exam completed. Follow up not indicated. 03/05/21 - Rutherford Guys, MD - Ophthalmology - Myopia - No notes available.   Hospital visits:  None in previous 6 months  Medications: Outpatient Encounter Medications as of 08/29/2021  Medication Sig   ALPRAZolam (XANAX) 0.25 MG tablet Take 1 tablet (0.25 mg total) by mouth daily as needed.   Ascorbic Acid (VITAMIN C PO) Take 500 mg by mouth daily.   atorvastatin (LIPITOR) 20 MG tablet TAKE 1 TABLET BY MOUTH EVERY DAY   buPROPion (WELLBUTRIN XL) 300 MG 24 hr tablet Take 1 tablet (300 mg total) by mouth daily.   busPIRone (BUSPAR) 7.5 MG tablet TAKE 1 TABLET BY MOUTH 2 TIMES DAILY.   Calcium Carb-Cholecalciferol (CALCIUM+D3 PO) Take 600 mg by mouth daily.   Cholecalciferol (VITAMIN D3 PO) Take 2,000 Units by mouth.   ibuprofen (ADVIL,MOTRIN) 200 MG tablet Take 400 mg by mouth daily as needed for headache.   zinc gluconate 50 MG tablet Take 50 mg by mouth daily.   No facility-administered encounter medications on file as of 08/29/2021.     Have you seen any other providers since your last visit? No  Any changes in your medications or health? No  Any side effects from any medications? No. Patient reports she does struggle with constipation some and unsure if this is medication related.  Do you have an symptoms or problems not managed by your medications? No  Any concerns about your health right now? No  Has your provider asked that you check blood pressure, blood sugar,  or follow special diet at home? No  Do you get any type of exercise on a regular basis? Yes. Reported she is very active with grand baby and walks up a lot of stairs in daughter   Can you think of a goal you would like to reach for your health? Patient does want to exercise more but has time constraints. She would also like to cut back on alcohol consumption.   Do you have any problems getting your medications? No  Is there anything that you would like to discuss during the appointment? Patient did not have anything she could think to add at this time.    Please bring medications and supplements to appointment. Patient confirmed appointment date and time on 09/17/21 at 11:00 am.   Care Gaps  AWV: done 01/27/21 Colonoscopy: due 11/22/23 DM Eye Exam: N/A DEXA: done 02/13/20 Mammogram: due 09/10/22   Star Rating Drugs: Atorvastatin (LIPITOR) 20 MG tablet - last filled 06/07/21 90 days    Future Appointments  Date Time Provider [place]  09/09/2021  8:00 AM Midge Minium, MD [facility]  09/17/2021 11:00 AM [facility] PHARMACIST [facility]  09/29/2021 10:30 AM [facility] 3 [facility]    Jobe Gibbon, CCMA Clinical Pharmacist Assistant  [phone]  Time Spent: 45 minutes

## 2021-09-04 ENCOUNTER — Other Ambulatory Visit: Payer: Self-pay | Admitting: Family Medicine

## 2021-09-09 ENCOUNTER — Encounter: Payer: Self-pay | Admitting: Family Medicine

## 2021-09-09 ENCOUNTER — Ambulatory Visit (INDEPENDENT_AMBULATORY_CARE_PROVIDER_SITE_OTHER): Payer: Medicare HMO | Admitting: Family Medicine

## 2021-09-09 ENCOUNTER — Other Ambulatory Visit: Payer: Self-pay

## 2021-09-09 VITALS — BP 126/80 | HR 73 | Temp 98.0°F | Resp 16 | Ht 64.0 in | Wt 131.0 lb

## 2021-09-09 DIAGNOSIS — E785 Hyperlipidemia, unspecified: Secondary | ICD-10-CM

## 2021-09-09 DIAGNOSIS — Z0000 Encounter for general adult medical examination without abnormal findings: Secondary | ICD-10-CM

## 2021-09-09 DIAGNOSIS — M8580 Other specified disorders of bone density and structure, unspecified site: Secondary | ICD-10-CM | POA: Insufficient documentation

## 2021-09-09 LAB — HEPATIC FUNCTION PANEL
ALT: 14 U/L (ref 0–35)
AST: 12 U/L (ref 0–37)
Albumin: 4.1 g/dL (ref 3.5–5.2)
Alkaline Phosphatase: 100 U/L (ref 39–117)
Bilirubin, Direct: 0.1 mg/dL (ref 0.0–0.3)
Total Bilirubin: 0.4 mg/dL (ref 0.2–1.2)
Total Protein: 6.8 g/dL (ref 6.0–8.3)

## 2021-09-09 LAB — LIPID PANEL
Cholesterol: 166 mg/dL (ref 0–200)
HDL: 89.4 mg/dL (ref >39.00)
LDL Cholesterol: 64 mg/dL (ref 0–99)
NonHDL: 76.19
Total CHOL/HDL Ratio: 2
Triglycerides: 62 mg/dL (ref 0.0–149.0)
VLDL: 12.4 mg/dL (ref 0.0–40.0)

## 2021-09-09 LAB — BASIC METABOLIC PANEL
BUN: 16 mg/dL (ref 6–23)
CO2: 31 mEq/L (ref 19–32)
Calcium: 8.9 mg/dL (ref 8.4–10.5)
Chloride: 102 mEq/L (ref 96–112)
Creatinine, Ser: 0.72 mg/dL (ref 0.40–1.20)
GFR: 85.77 mL/min (ref >60.00)
Glucose, Bld: 96 mg/dL (ref 70–99)
Potassium: 4.1 mEq/L (ref 3.5–5.1)
Sodium: 140 mEq/L (ref 135–145)

## 2021-09-09 LAB — TSH: TSH: 5.14 u[IU]/mL (ref 0.35–5.50)

## 2021-09-09 LAB — VITAMIN D 25 HYDROXY (VIT D DEFICIENCY, FRACTURES): VITD: 34.67 ng/mL (ref 30.00–100.00)

## 2021-09-09 NOTE — Patient Instructions (Addendum)
Follow up in 6 months to recheck cholesterol We'll notify you of your lab results and make any changes if needed Keep up the good work on healthy diet and regular exercise- you look great! Call with any questions or concerns Stay Safe!  Stay Healthy! Happy Fall!!! 

## 2021-09-09 NOTE — Assessment & Plan Note (Signed)
Pt's PE WNL.  UTD on mammo, DEXA, colonoscopy, immunizations.  Wants to hold on flu shot b/c she is recovering from a cold.  Check labs.  Anticipatory guidance provided.

## 2021-09-09 NOTE — Assessment & Plan Note (Signed)
UTD on DEXA.  Check Vit D and replete prn. 

## 2021-09-09 NOTE — Assessment & Plan Note (Signed)
Chronic problem.  On Lipitor 20mg daily w/o difficulty.  Check labs.  Adjust meds prn  

## 2021-09-09 NOTE — Progress Notes (Signed)
   Subjective:    Patient ID: Dana Bryan, female    DOB: [birth year], 70 y.o.   MRN: [id number]  HPI CPE- UTD on pneumonia vaccines, Tdap, colonoscopy, mammo, DEXA.  Pt reports feeling 'pretty good'  no concerns today.  Patient Care Team    Relationship Specialty Notifications Start End  Midge Minium, MD PCP - General Family Medicine  01/06/19   Paula Compton, MD Consulting Physician Obstetrics and Gynecology  01/06/19   Armbruster, Carlota Raspberry, MD Consulting Physician Gastroenterology  01/23/20   Madelin Rear, [facility] Pharmacist Pharmacist  08/04/21    Comment: [phone]     Health Maintenance  Topic Date Due   INFLUENZA VACCINE  09/22/2021 (Originally 07/21/2021)   MAMMOGRAM  09/10/2022   COLONOSCOPY (Pts 45-63yrs Insurance coverage will need to be confirmed)  11/22/2023   TETANUS/TDAP  01/06/2026   DEXA SCAN  Completed   COVID-19 Vaccine  Completed   Hepatitis C Screening  Completed   Zoster Vaccines- Shingrix  Completed   HPV VACCINES  Aged Out      Review of Systems Patient reports no vision/ hearing changes, adenopathy,fever, weight change,  persistant/recurrent hoarseness , swallowing issues, chest pain, palpitations, edema, persistant/recurrent cough, hemoptysis, dyspnea (rest/exertional/paroxysmal nocturnal), gastrointestinal bleeding (melena, rectal bleeding), abdominal pain, significant heartburn, bowel changes, GU symptoms (dysuria, hematuria, incontinence), Gyn symptoms (abnormal  bleeding, pain),  syncope, focal weakness, memory loss, numbness & tingling, skin/hair/nail changes, abnormal bruising or bleeding, anxiety, or depression.   + constipation  This visit occurred during the SARS-CoV-2 public health emergency.  Safety protocols were in place, including screening questions prior to the visit, additional usage of staff PPE, and extensive cleaning of exam room while observing appropriate contact time as indicated for disinfecting solutions.       Objective:   Physical Exam General Appearance:    Alert, cooperative, no distress, appears stated age  Head:    Normocephalic, without obvious abnormality, atraumatic  Eyes:    PERRL, conjunctiva/corneas clear, EOM's intact, fundi    benign, both eyes  Ears:    Normal TM's and external ear canals, both ears  Nose:   Nares normal, septum midline, mucosa normal, no drainage    or sinus tenderness  Throat:   Lips, mucosa, and tongue normal; teeth and gums normal  Neck:   Supple, symmetrical, trachea midline, no adenopathy;    Thyroid: no enlargement/tenderness/nodules  Back:     Symmetric, no curvature, ROM normal, no CVA tenderness  Lungs:     Clear to auscultation bilaterally, respirations unlabored  Chest Wall:    No tenderness or deformity   Heart:    Regular rate and rhythm, S1 and S2 normal, I-II/VI SEM, no rub or gallop  Breast Exam:    Deferred to GYN  Abdomen:     Soft, non-tender, bowel sounds active all four quadrants,    no masses, no organomegaly  Genitalia:    Deferred to GYN  Rectal:    Extremities:   Extremities normal, atraumatic, no cyanosis or edema  Pulses:   2+ and symmetric all extremities  Skin:   Skin color, texture, turgor normal, no rashes or lesions  Lymph nodes:   Cervical, supraclavicular, and axillary nodes normal  Neurologic:   CNII-XII intact, normal strength, sensation and reflexes    throughout          Assessment & Plan:

## 2021-09-11 NOTE — Addendum Note (Signed)
Addended by: Lerry Liner on: 09/11/2021 04:34 PM   Modules accepted: Orders

## 2021-09-17 ENCOUNTER — Ambulatory Visit (INDEPENDENT_AMBULATORY_CARE_PROVIDER_SITE_OTHER): Payer: Medicare HMO

## 2021-09-17 DIAGNOSIS — E785 Hyperlipidemia, unspecified: Secondary | ICD-10-CM

## 2021-09-17 DIAGNOSIS — F419 Anxiety disorder, unspecified: Secondary | ICD-10-CM

## 2021-09-17 DIAGNOSIS — F32A Depression, unspecified: Secondary | ICD-10-CM

## 2021-09-17 NOTE — Progress Notes (Signed)
Chronic Care Management Pharmacy Note  09/17/2021 Name:  Dana Bryan MRN:  [id number] DOB:  Apr 29, 1951  Summary: -No Rx changes  Subjective: Dana Bryan is an 70 y.o. year old female who is a primary patient of Tabori, Aundra Millet, MD.  The CCM team was consulted for assistance with disease management and care coordination needs.    Engaged with patient by telephone for initial visit in response to provider referral for pharmacy case management and/or care coordination services.   Consent to Services:  The patient was given the following information about Chronic Care Management services today, agreed to services, and gave verbal consent: 1. CCM service includes personalized support from designated clinical staff supervised by the primary care provider, including individualized plan of care and coordination with other care providers 2. 24/7 contact phone numbers for assistance for urgent and routine care needs. 3. Service will only be billed when office clinical staff spend 20 minutes or more in a month to coordinate care. 4. Only one practitioner may furnish and bill the service in a calendar month. 5.The patient may stop CCM services at any time (effective at the end of the month) by phone call to the office staff. 6. The patient will be responsible for cost sharing (co-pay) of up to 20% of the service fee (after annual deductible is met). Patient agreed to services and consent obtained.  Patient Care Team: Midge Minium, MD as PCP - General (Family Medicine) Paula Compton, MD as Consulting Physician (Obstetrics and Gynecology) Havery Moros, Carlota Raspberry, MD as Consulting Physician (Gastroenterology) Madelin Rear, [facility] as Pharmacist (Pharmacist)  Hospital visits: None in previous 6 months  Objective:  Lab Results  Component Value Date   CREATININE 0.72 09/09/2021   CREATININE 0.75 02/07/2021   CREATININE 0.75 07/23/2020    Lab Results  Component Value  Date   HGBA1C 5.3 03/22/2019   Last diabetic Eye exam: No results found for: HMDIABEYEEXA  Last diabetic Foot exam: No results found for: HMDIABFOOTEX      Component Value Date/Time   CHOL 166 09/09/2021 0840   TRIG 62.0 09/09/2021 0840   TRIG 79 12/16/2006 0919   HDL 89.40 09/09/2021 0840   CHOLHDL 2 09/09/2021 0840   VLDL 12.4 09/09/2021 0840   LDLCALC 64 09/09/2021 0840   LDLDIRECT 168.5 07/30/2011 0739    Hepatic Function Latest Ref Rng & Units 09/09/2021 02/07/2021 07/23/2020  Total Protein 6.0 - 8.3 g/dL 6.8 7.0 6.9  Albumin 3.5 - 5.2 g/dL 4.1 4.0 4.5  AST 0 - 37 U/L _0 ALT 0 - 35 U/L _1 Alk Phosphatase 39 - 117 U/L 100 92 87  Total Bilirubin 0.2 - 1.2 mg/dL 0.4 0.4 0.5  Bilirubin, Direct 0.0 - 0.3 mg/dL 0.1 0.0 0.1    Lab Results  Component Value Date/Time   TSH 5.14 09/09/2021 08:40 AM   TSH 4.07 02/07/2021 08:52 AM   FREET4 0.72 01/24/2020 03:12 PM   FREET4 0.85 02/02/2019 12:07 PM    CBC Latest Ref Rng & Units 01/23/2020 03/22/2019 03/21/2019  WBC 4.0 - 10.5 K/uL 5.7 4.9 7.2  Hemoglobin 12.0 - 15.0 g/dL 14.5 12.6 14.6  Hematocrit 36.0 - 46.0 % 42.9 37.5 44.1  Platelets 150.0 - 400.0 K/uL 247.0 259 301    Lab Results  Component Value Date/Time   VD25OH 34.67 09/09/2021 08:40 AM    Clinical ASCVD:  The 10-year ASCVD risk score (Arnett DK, et al., 2019) is: 6%  Values used to calculate the score:     Age: 60 years     Sex: Female     Is Non-Hispanic African American: No     Diabetic: No     Tobacco smoker: No     Systolic Blood Pressure: 262 mmHg     Is BP treated: No     HDL Cholesterol: 89.4 mg/dL     Total Cholesterol: 166 mg/dL    Social History   Tobacco Use  Smoking Status Former   Types: Cigarettes   Quit date: 01/22/2004   Years since quitting: 17.6  Smokeless Tobacco Never   BP Readings from Last 3 Encounters:  09/09/21 126/80  02/07/21 120/78  11/21/20 120/68   Pulse Readings from Last 3 Encounters:  09/09/21 73   02/07/21 72  11/21/20 65   Wt Readings from Last 3 Encounters:  09/09/21 131 lb (59.4 kg)  02/07/21 126 lb 6.4 oz (57.3 kg)  01/27/21 125 lb (56.7 kg)    Assessment: Review of patient past medical history, allergies, medications, health status, including review of consultants reports, laboratory and other test data, was performed as part of comprehensive evaluation and provision of chronic care management services.   SDOH:  (Social Determinants of Health) assessments and interventions performed: Yes   CCM Care Plan  Allergies  Allergen Reactions   Chlorhexidine Gluconate     Medications Reviewed Today     Reviewed by Madelin Rear, [facility] (Pharmacist) on 09/17/21 at [place] List Status: <None>   Medication Order Taking? Sig Documenting Provider Last Dose Status Informant  ALPRAZolam (XANAX) 0.25 MG tablet [id number]  Take 1 tablet (0.25 mg total) by mouth daily as needed. Midge Minium, MD  Active   Ascorbic Acid (VITAMIN C PO) [id number] Yes Take 500 mg by mouth daily. [provider] Taking Active   atorvastatin (LIPITOR) 20 MG tablet [id number] Yes TAKE 1 TABLET BY MOUTH EVERY DAY Midge Minium, MD Taking Active   buPROPion (WELLBUTRIN XL) 300 MG 24 hr tablet [id number] Yes Take 1 tablet (300 mg total) by mouth daily. Midge Minium, MD Taking Active   busPIRone (BUSPAR) 7.5 MG tablet [id number] Yes TAKE 1 TABLET BY MOUTH 2 TIMES DAILY. Midge Minium, MD Taking Active   Cholecalciferol (VITAMIN D3 PO) [id number] Yes Take 2,000 Units by mouth. [provider] Taking Active   ibuprofen (ADVIL,MOTRIN) 200 MG tablet [id number]  Take 400 mg by mouth daily as needed for headache. [provider]  Active Self  zinc gluconate 50 MG tablet [id number]  Take 50 mg by mouth daily. [provider]  Active             Patient Active Problem List   Diagnosis Date Noted   Osteopenia 09/09/2021   Heart murmur 53/74/8270   Complicated  migraine 78/67/5449   Alcohol abuse, daily use 01/06/2019   Routine health maintenance 08/06/2011   VITAMIN B12 DEFICIENCY 07/18/2008   Hyperlipidemia 02/15/2008   Anxiety and depression 02/15/2008    Immunization History  Administered Date(s) Administered   Fluad Quad(high Dose 65+) 09/10/2020   Influenza Whole 10/12/2008, 09/16/2009, 07/28/2012   Influenza, High Dose Seasonal PF 09/18/2019   Influenza,inj,Quad PF,6+ Mos 08/31/2014, 10/26/2017   Influenza-Unspecified 09/28/2013, 10/05/2015, 10/31/2018   PFIZER Comirnaty(Gray Top)Covid-19 Tri-Sucrose Vaccine 04/01/2021   PFIZER(Purple Top)SARS-COV-2 Vaccination 02/11/2020, 03/06/2020, 10/19/2020   Pneumococcal Conjugate-13 01/06/2019   Pneumococcal Polysaccharide-23 02/07/2021   Tdap 01/07/2016   Zoster Recombinat (Shingrix) 07/21/2019, 10/21/2019   Zoster,  Live 11/08/2014    Conditions to be addressed/monitored: HTN, HLD, Anxiety, Depression, and Osteopenia  Care Plan : ccm pharmacy care plan  Updates made by Madelin Rear, [facility] since 09/17/2021 12:00 AM     Problem: hld mdd/gad osteopenia   Priority: High     Long-Range Goal: Disease management   Note:    Pharmacist Clinical Goal(s):  Patient will verbalize ability to afford treatment regimen contact provider office for questions/concerns as evidenced notation of same in electronic health record through collaboration with PharmD and provider.   Interventions: 1:1 collaboration with Midge Minium, MD regarding development and update of comprehensive plan of care as evidenced by provider attestation and co-signature Inter-disciplinary care team collaboration (see longitudinal plan of care) Comprehensive medication review performed; medication list updated in electronic medical record  Hyperlipidemia: (LDL goal < 100) -Controlled -Current treatment: Atorvastatin 20 mg once daily  -Medications previously tried: n/a  -Current dietary patterns: no eating out, no  fried foods. cheerios, slice of cheese toast, toast with peanut butter. 6-8 cups of coffee. 2-3 drinks/day. Drinks water through the day. -Current exercise habits: no formal exercise, watching after grand children. -Educated on Cholesterol goals;  Exercise goal of 150 minutes per week; -Recommended to continue current medication  Depression/Anxiety (Goal: minimize symptoms) -Controlled -Current treatment: Buspar 7.5 mg twice daily  Bupropion XL 300 mg once daily  Alprazolam 0.25 mg as needed (rare) -Medications previously tried/failed: prozac (sexual dysfunction)   -PHQ9: 0 -GAD7: 9 -Declined referral mental health support -Educated on Benefits of cognitive-behavioral therapy with or without medication -Recommended to continue current medication -Goal to cut down on alcohol use - current 2-3 drinks/day - reviewed replacement/substitution options for mixed drinks/wine   Osteopenia (Goal ensure appropriate supplementation) -Controlled -Last DEXA Scan: 01/2020 - osteopenia  -Patient is not a candidate for pharmacologic treatment -Current treatment  Vitamin D3 2000 units daily  -Recommend [phone] units of vitamin D daily. Recommend 1200 mg of calcium daily from dietary and supplemental sources. Recommend weight-bearing and muscle strengthening exercises for building and maintaining bone density. -Reviewed calcium containing foods and trial off of calcium supplement to see if constipation improves  Patient Goals/Self-Care Activities Patient will:  - take medications as prescribed target a minimum of 150 minutes of moderate intensity exercise weekly  Follow Up Plan: PharmD f/u telephone visit 6 months, CPA pt call 1 month to check on constipation/medication related needs  Medication Assistance: None required.  Patient affirms current coverage meets needs.  Patient's preferred pharmacy is:  [facility] [id number], [place] [address] [place] [address] Phone: [phone] Fax: [phone]  [id number] [id number]: [phone]: [phone] Pt endorses 100% compliance  Follow Up:  Patient agrees to Care Plan and Follow-up.     Future Appointments  Date Time Provider [place] 09/29/2021 10:30 AM [facility] MM 3 [facility]  03/10/2022  8:30 AM TMidge Minium MD [facility]  03/18/2022  2:00 PM [facility] PHARMACIST [facility]   JMadelin Rear PharmD, CPP Clinical Pharmacist Practitioner  [place] [phone]

## 2021-09-17 NOTE — Patient Instructions (Signed)
Ms. Titsworth,  Thank you for talking with me today. I have included our care plan/goals in the following pages.   Thanks! Ellin Mayhew, PharmD Clinical Pharmacist  [phone]  Care Plan : ccm pharmacy care plan  Updates made by Madelin Rear, [facility] since 09/17/2021 12:00 AM     Problem: hld mdd/gad osteopenia   Priority: High     Long-Range Goal: Disease management   Note:    Pharmacist Clinical Goal(s):  Patient will verbalize ability to afford treatment regimen contact provider office for questions/concerns as evidenced notation of same in electronic health record through collaboration with PharmD and provider.   Interventions: 1:1 collaboration with Midge Minium, MD regarding development and update of comprehensive plan of care as evidenced by provider attestation and co-signature Inter-disciplinary care team collaboration (see longitudinal plan of care) Comprehensive medication review performed; medication list updated in electronic medical record  Hyperlipidemia: (LDL goal < 100) -Controlled -Current treatment: Atorvastatin 20 mg once daily  -Medications previously tried: n/a  -Current dietary patterns: no eating out, no fried foods. cheerios, slice of cheese toast, toast with peanut butter. 6-8 cups of coffee. 2-3 drinks/day. Drinks water through the day. -Current exercise habits: no formal exercise, watching after grand children. -Educated on Cholesterol goals;  Exercise goal of 150 minutes per week; -Recommended to continue current medication  Depression/Anxiety (Goal: minimize symptoms) -Controlled -Current treatment: Buspar 7.5 mg twice daily  Bupropion XL 300 mg once daily  Alprazolam 0.25 mg as needed (rare) -Medications previously tried/failed: prozac (sexual dysfunction)   -PHQ9: 0 -GAD7: 9 -Declined referral mental health support -Educated on Benefits of cognitive-behavioral therapy with or without medication -Recommended to continue  current medication -Goal to cut down on alcohol use - current 2-3 drinks/day - reviewed replacement/substitution options for mixed drinks/wine   Osteopenia (Goal ensure appropriate supplementation) -Controlled -Last DEXA Scan: 01/2020 - osteopenia  -Patient is not a candidate for pharmacologic treatment -Current treatment  Vitamin D3 2000 units daily  -Recommend [phone] units of vitamin D daily. Recommend 1200 mg of calcium daily from dietary and supplemental sources. Recommend weight-bearing and muscle strengthening exercises for building and maintaining bone density. -Reviewed calcium containing foods and trial off of calcium supplement to see if constipation improves  Patient Goals/Self-Care Activities Patient will:  - take medications as prescribed target a minimum of 150 minutes of moderate intensity exercise weekly  Follow Up Plan: PharmD f/u telephone visit 6 months, CPA pt call 1 month to check on constipation/medication related needs  Medication Assistance: None required.  Patient affirms current coverage meets needs.  Patient's preferred pharmacy is:  [id number], [place] [address] [place] [address] Phone: [phone] Fax: [phone]  [id number] [place] [place] [place]Phone: [phone]: [phone] Pt endorses 100% compliance  Follow Up:  Patient agrees to Care Plan and Follow-up.     The patient was given the following information about Chronic Care Management services today, agreed to services, and gave verbal consent: 1. CCM service includes personalized support from designated clinical staff supervised by the primary care provider, including individualized plan of care and coordination with other care providers 2. 24/7 contact phone numbers for assistance for urgent and routine care needs. 3. Service will  only be billed when office clinical staff spend 20 minutes or more in a month to coordinate care. 4.  Only one practitioner may furnish and bill the service in a calendar month. 5.The patient may stop CCM services at any time (effective at the end of the month) by phone call to the office staff. 6. The patient will be responsible for cost sharing (co-pay) of up to 20% of the service fee (after annual deductible is met). Patient agreed to services and consent obtained.  The patient verbalized understanding of instructions provided today and agreed to receive a MyChart copy of patient instruction and/or educational materials. Telephone follow up appointment with pharmacy team member scheduled for: See next appointment with "Care Management Staff" under "What's Next" below.

## 2021-09-19 DIAGNOSIS — R69 Illness, unspecified: Secondary | ICD-10-CM | POA: Diagnosis not present

## 2021-09-19 DIAGNOSIS — E785 Hyperlipidemia, unspecified: Secondary | ICD-10-CM

## 2021-09-19 DIAGNOSIS — F419 Anxiety disorder, unspecified: Secondary | ICD-10-CM | POA: Diagnosis not present

## 2021-09-19 DIAGNOSIS — F32A Depression, unspecified: Secondary | ICD-10-CM | POA: Diagnosis not present

## 2021-09-29 ENCOUNTER — Other Ambulatory Visit: Payer: Self-pay

## 2021-09-29 ENCOUNTER — Ambulatory Visit
Admission: RE | Admit: 2021-09-29 | Discharge: 2021-09-29 | Disposition: A | Discharge status: Home / Self Care | Payer: Medicare HMO | Source: Ambulatory Visit

## 2021-09-29 DIAGNOSIS — Z1231 Encounter for screening mammogram for malignant neoplasm of breast: Secondary | ICD-10-CM

## 2021-09-30 ENCOUNTER — Telehealth: Payer: Self-pay

## 2021-09-30 NOTE — Progress Notes (Signed)
    Chronic Care Management Pharmacy Assistant   Name: Dana Bryan  MRN: [id number] DOB: Jan 24, 1951   Reason for Encounter: Disease State - General Adherence Call   Recent office visits:  None noted.   Recent consult visits:  None noted.   Hospital visits:  None in previous 6 months  Medications: Outpatient Encounter Medications as of 09/30/2021  Medication Sig   ALPRAZolam (XANAX) 0.25 MG tablet Take 1 tablet (0.25 mg total) by mouth daily as needed.   Ascorbic Acid (VITAMIN C PO) Take 500 mg by mouth daily.   atorvastatin (LIPITOR) 20 MG tablet TAKE 1 TABLET BY MOUTH EVERY DAY   buPROPion (WELLBUTRIN XL) 300 MG 24 hr tablet Take 1 tablet (300 mg total) by mouth daily.   busPIRone (BUSPAR) 7.5 MG tablet TAKE 1 TABLET BY MOUTH 2 TIMES DAILY.   Cholecalciferol (VITAMIN D3 PO) Take 2,000 Units by mouth.   ibuprofen (ADVIL,MOTRIN) 200 MG tablet Take 400 mg by mouth daily as needed for headache.   zinc gluconate 50 MG tablet Take 50 mg by mouth daily.   No facility-administered encounter medications on file as of 09/30/2021.    Have you had any problems recently with your health? Patient denied having any recent problems with her health.  Have you had any problems with your pharmacy? Patient denied having any problems with her current pharmacy.  What issues or side effects are you having with your medications? Patient denied having any issues or side effects with her current medications.   What would you like me to pass along to Madelin Rear, CPP for them to help you with?  Patient reported her constipation has completely resolved since stopping the calcium supplement. She stated she was very appreciative of this advice and wanted to thank Edison Nasuti for his time.  What can we do to take care of you better? Patient did not have any suggestions at this time.   Care Gaps  AWV: done 02/13/21 Colonoscopy: done 11/21/20 DM Eye Exam: N/A DM Foot Exam: N/A Microalbumin:  N/A HbgAIC: N/A DEXA: done 02/13/20 Mammogram: done 09/29/21   Star Rating Drugs: Atorvastatin (LIPITOR) 20 MG tablet - last filled 09/04/21 90 days   Future Appointments  Date Time Provider [place]  03/10/2022  8:30 AM Midge Minium, MD [facility]  03/18/2022  2:00 PM [facility] PHARMACIST [facility] [place], [place] Clinical Pharmacist Assistant  [phone]  Time Spent: 33 minutes

## 2021-10-02 DIAGNOSIS — N8110 Cystocele, unspecified: Secondary | ICD-10-CM | POA: Diagnosis not present

## 2021-10-02 DIAGNOSIS — Z01411 Encounter for gynecological examination (general) (routine) with abnormal findings: Secondary | ICD-10-CM | POA: Diagnosis not present

## 2021-10-02 DIAGNOSIS — N819 Female genital prolapse, unspecified: Secondary | ICD-10-CM | POA: Diagnosis not present

## 2021-10-02 DIAGNOSIS — N9089 Other specified noninflammatory disorders of vulva and perineum: Secondary | ICD-10-CM | POA: Diagnosis not present

## 2021-10-02 DIAGNOSIS — Z124 Encounter for screening for malignant neoplasm of cervix: Secondary | ICD-10-CM | POA: Diagnosis not present

## 2021-10-10 ENCOUNTER — Other Ambulatory Visit: Payer: Self-pay | Admitting: Family Medicine

## 2021-10-30 DIAGNOSIS — H5213 Myopia, bilateral: Secondary | ICD-10-CM | POA: Diagnosis not present

## 2021-10-30 DIAGNOSIS — N9089 Other specified noninflammatory disorders of vulva and perineum: Secondary | ICD-10-CM | POA: Diagnosis not present

## 2021-10-30 DIAGNOSIS — H25813 Combined forms of age-related cataract, bilateral: Secondary | ICD-10-CM | POA: Diagnosis not present

## 2021-10-30 DIAGNOSIS — N819 Female genital prolapse, unspecified: Secondary | ICD-10-CM | POA: Diagnosis not present

## 2021-10-30 DIAGNOSIS — N8110 Cystocele, unspecified: Secondary | ICD-10-CM | POA: Diagnosis not present

## 2021-10-30 DIAGNOSIS — H524 Presbyopia: Secondary | ICD-10-CM | POA: Diagnosis not present

## 2021-11-04 ENCOUNTER — Other Ambulatory Visit: Payer: Self-pay

## 2021-11-04 MED ORDER — BUPROPION HCL ER (XL) 300 MG PO TB24: 300.0000 mg | ORAL_TABLET | Freq: Every day | ORAL | 1 refills | Status: DC

## 2021-11-30 ENCOUNTER — Encounter: Payer: Self-pay | Admitting: Family Medicine

## 2021-12-02 ENCOUNTER — Encounter: Payer: Self-pay | Admitting: Registered Nurse

## 2021-12-02 ENCOUNTER — Other Ambulatory Visit: Payer: Self-pay

## 2021-12-02 ENCOUNTER — Ambulatory Visit (INDEPENDENT_AMBULATORY_CARE_PROVIDER_SITE_OTHER): Payer: Medicare HMO | Admitting: Registered Nurse

## 2021-12-02 VITALS — BP 138/74 | HR 90 | Temp 98.0°F | Resp 18 | Ht 64.0 in | Wt 134.4 lb

## 2021-12-02 DIAGNOSIS — M549 Dorsalgia, unspecified: Secondary | ICD-10-CM | POA: Diagnosis not present

## 2021-12-02 DIAGNOSIS — N3000 Acute cystitis without hematuria: Secondary | ICD-10-CM | POA: Diagnosis not present

## 2021-12-02 LAB — POCT URINALYSIS DIP (MANUAL ENTRY)
Bilirubin, UA: NEGATIVE
Blood, UA: NEGATIVE
Glucose, UA: NEGATIVE mg/dL
Ketones, POC UA: NEGATIVE mg/dL
Nitrite, UA: NEGATIVE
Protein Ur, POC: NEGATIVE mg/dL
Spec Grav, UA: 1.015 (ref 1.010–1.025)
Urobilinogen, UA: 0.2 E.U./dL
pH, UA: 6.5 (ref 5.0–8.0)

## 2021-12-02 MED ORDER — SULFAMETHOXAZOLE-TRIMETHOPRIM 800-160 MG PO TABS: 1.0000 | ORAL_TABLET | Freq: Two times a day (BID) | ORAL | 0 refills | Status: DC

## 2021-12-02 NOTE — Patient Instructions (Addendum)
Dana Bryan -   Dana Bryan to meet you  Take bactrim as instructed  Ok to use AZO or generic.  Stay well hydrated  I'll call if the urine culture shows any concerns. Results will be available on MyChart as well  Thank you  Rich     If you have lab work done today you will be contacted with your lab results within the next 2 weeks.  If you have not heard from [place] then please contact us. The fastest way to get your results is to register for My Chart.   IF you received an x-ray today, you will receive an invoice from [facility]. Please contact [facility] at [phone] with questions or concerns regarding your invoice.   IF you received labwork today, you will receive an invoice from [facility]. Please contact [facility] at [phone] with questions or concerns regarding your invoice.   Our billing staff will not be able to assist you with questions regarding bills from these companies.  You will be contacted with the lab results as soon as they are available. The fastest way to get your results is to activate your My Chart account. Instructions are located on the last page of this paperwork. If you have not heard from [place] regarding the results in 2 weeks, please contact this office.

## 2021-12-03 ENCOUNTER — Other Ambulatory Visit: Payer: Self-pay | Admitting: Family Medicine

## 2021-12-05 ENCOUNTER — Other Ambulatory Visit: Payer: Self-pay | Admitting: Registered Nurse

## 2021-12-05 ENCOUNTER — Encounter: Payer: Self-pay | Admitting: Family Medicine

## 2021-12-05 ENCOUNTER — Encounter: Payer: Self-pay | Admitting: Registered Nurse

## 2021-12-05 DIAGNOSIS — N3000 Acute cystitis without hematuria: Secondary | ICD-10-CM

## 2021-12-05 DIAGNOSIS — M549 Dorsalgia, unspecified: Secondary | ICD-10-CM

## 2021-12-05 MED ORDER — SULFAMETHOXAZOLE-TRIMETHOPRIM 800-160 MG PO TABS: 1.0000 | ORAL_TABLET | Freq: Two times a day (BID) | ORAL | 0 refills | Status: DC

## 2021-12-07 ENCOUNTER — Other Ambulatory Visit: Payer: Self-pay | Admitting: Family Medicine

## 2021-12-08 NOTE — Progress Notes (Signed)
Established Patient Office Visit  Subjective:  Patient ID: Dana Bryan, female    DOB: 08-27-1951  Age: 70 y.o. MRN: [id number]  CC:  Chief Complaint  Patient presents with   Back Pain    Patient states she is having some lower back pain , come pressure when urinating and  pressure.    HPI Docia Klar presents for back pain  Onset around 1 week ago Lower back / flank pain, pressure with urination. Some frequency and urgency  No gross hematuria  No systemic symptoms.  Past Medical History:  Diagnosis Date   Anxiety    on meds   Basal cell carcinoma    Cancer (HCC)    Cataract    no sx    Generalized headaches    Heart murmur    per Dr.Tabori   Hyperlipidemia    on meds   Reactive depression (situational)    on meds   Squamous cell skin cancer, finger    Vasomotor rhinitis     Past Surgical History:  Procedure Laterality Date   COLONOSCOPY  2008   normal    TONSILLECTOMY  1973    Family History  Problem Relation Age of Onset   Alcohol abuse Father    Hypertension Mother    Colon polyps Neg Hx    Colon cancer Neg Hx    Esophageal cancer Neg Hx    Stomach cancer Neg Hx    Rectal cancer Neg Hx     Social History   Socioeconomic History   Marital status: Married    Spouse name: Not on file   Number of children: Not on file   Years of education: Not on file   Highest education level: Not on file  Occupational History   Not on file  Tobacco Use   Smoking status: Former    Types: Cigarettes    Quit date: 01/22/2004    Years since quitting: 17.8   Smokeless tobacco: Never  Vaping Use   Vaping Use: Never used  Substance and Sexual Activity   Alcohol use: Yes    Alcohol/week: 15.0 standard drinks    Types: 15 Standard drinks or equivalent per week    Comment: drinks 1-2 drinks daily   Drug use: Never   Sexual activity: Not on file  Other Topics Concern   Not on file  Social History Narrative   HSG, college Grad. Married  '71. 2 Daughters- '77, '81. 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, occupational)   Social Determinants of Radio broadcast assistant Strain: Low Risk    Difficulty of Paying Living Expenses: Not hard at all  Food Insecurity: No Food Insecurity   Worried About Charity fundraiser in the Last Year: Never true   Arboriculturist in the Last Year: Never true  Transportation Needs: No Transportation Needs   Lack of Transportation (Medical): No   Lack of Transportation (Non-Medical): No  Physical Activity: Inactive   Days of Exercise per Week: 0 days   Minutes of Exercise per Session: 0 min  Stress: No Stress Concern Present   Feeling of Stress : Only a little  Social Connections: Moderately Isolated   Frequency of Communication with Friends and Family: More than three times a week   Frequency of Social Gatherings with Friends and Family: More than three times a week   Attends Religious Services:  Never   Active Member of Clubs or Organizations: No   Attends Archivist Meetings: Never   Marital Status: Married  Human resources officer Violence: Not At Risk   Fear of Current or Ex-Partner: No   Emotionally Abused: No   Physically Abused: No   Sexually Abused: No    Outpatient Medications Prior to Visit  Medication Sig Dispense Refill   ALPRAZolam (XANAX) 0.25 MG tablet Take 1 tablet (0.25 mg total) by mouth daily as needed. 30 tablet 0   Ascorbic Acid (VITAMIN C PO) Take 500 mg by mouth daily.     buPROPion (WELLBUTRIN XL) 300 MG 24 hr tablet Take 1 tablet (300 mg total) by mouth daily. 90 tablet 1   busPIRone (BUSPAR) 7.5 MG tablet TAKE 1 TABLET BY MOUTH TWICE A DAY 180 tablet 0   Cholecalciferol (VITAMIN D3 PO) Take 2,000 Units by mouth.     ibuprofen (ADVIL,MOTRIN) 200 MG tablet Take 400 mg by mouth daily as needed for headache.     zinc gluconate 50 MG tablet Take 50 mg by mouth daily.     atorvastatin (LIPITOR) 20  MG tablet TAKE 1 TABLET BY MOUTH EVERY DAY 90 tablet 0   No facility-administered medications prior to visit.    Allergies  Allergen Reactions   Chlorhexidine Gluconate     ROS Review of Systems  Constitutional: Negative.   HENT: Negative.    Eyes: Negative.   Respiratory: Negative.    Cardiovascular: Negative.   Gastrointestinal: Negative.   Genitourinary:  Positive for dysuria and flank pain.  Skin: Negative.   Neurological: Negative.   Psychiatric/Behavioral: Negative.    All other systems reviewed and are negative.    Objective:    Physical Exam Vitals and nursing note reviewed.  Constitutional:      General: She is not in acute distress.    Appearance: Normal appearance. She is normal weight. She is not ill-appearing, toxic-appearing or diaphoretic.  Cardiovascular:     Rate and Rhythm: Normal rate and regular rhythm.     Heart sounds: Normal heart sounds. No murmur heard.   No friction rub. No gallop.  Pulmonary:     Effort: Pulmonary effort is normal. No respiratory distress.     Breath sounds: Normal breath sounds. No stridor. No wheezing, rhonchi or rales.  Chest:     Chest wall: No tenderness.  Skin:    General: Skin is warm and dry.  Neurological:     General: No focal deficit present.     Mental Status: She is alert and oriented to person, place, and time. Mental status is at baseline.  Psychiatric:        Mood and Affect: Mood normal.        Behavior: Behavior normal.        Thought Content: Thought content normal.        Judgment: Judgment normal.    BP 138/74    Pulse 90    Temp 98 F (36.7 C) (Temporal)    Resp 18    Ht 5\' 4"  (1.626 m)    Wt 134 lb 6.4 oz (61 kg)    SpO2 99%    BMI 23.07 kg/m  Wt Readings from Last 3 Encounters:  12/02/21 134 lb 6.4 oz (61 kg)  09/09/21 131 lb (59.4 kg)  02/07/21 126 lb 6.4 oz (57.3 kg)     There are no preventive care reminders to display for this patient.  There are no preventive care  reminders to  display for this patient.  Lab Results  Component Value Date   TSH 5.14 09/09/2021   Lab Results  Component Value Date   WBC 5.7 01/23/2020   HGB 14.5 01/23/2020   HCT 42.9 01/23/2020   MCV 91.4 01/23/2020   PLT 247.0 01/23/2020   Lab Results  Component Value Date   NA 140 09/09/2021   K 4.1 09/09/2021   CO2 31 09/09/2021   GLUCOSE 96 09/09/2021   BUN 16 09/09/2021   CREATININE 0.72 09/09/2021   BILITOT 0.4 09/09/2021   ALKPHOS 100 09/09/2021   AST 12 09/09/2021   ALT 14 09/09/2021   PROT 6.8 09/09/2021   ALBUMIN 4.1 09/09/2021   CALCIUM 8.9 09/09/2021   ANIONGAP 13 03/21/2019   GFR 85.77 09/09/2021   Lab Results  Component Value Date   CHOL 166 09/09/2021   Lab Results  Component Value Date   HDL 89.40 09/09/2021   Lab Results  Component Value Date   LDLCALC 64 09/09/2021   Lab Results  Component Value Date   TRIG 62.0 09/09/2021   Lab Results  Component Value Date   CHOLHDL 2 09/09/2021   Lab Results  Component Value Date   HGBA1C 5.3 03/22/2019      Assessment & Plan:   Problem List Items Addressed This Visit   None Visit Diagnoses     Back pain, unspecified back location, unspecified back pain laterality, unspecified chronicity    -  Primary   Relevant Orders   POCT urinalysis dipstick (Completed)   Urine Culture   Acute cystitis without hematuria       Relevant Orders   Urine Culture       Meds ordered this encounter  Medications   DISCONTD: sulfamethoxazole-trimethoprim (BACTRIM DS) 800-160 MG tablet    Sig: Take 1 tablet by mouth 2 (two) times daily.    Dispense:  6 tablet    Refill:  0    Order Specific Question:   Supervising Provider    Answer:   Carlota Raspberry, JEFFREY R [[id number]]    Follow-up: Return if symptoms worsen or fail to improve.   PLAN Poct ua suggestive of UTI. Will send culture Start bactrim po bid x 3 days as above, adjust treatment based on culture results Patient encouraged to call clinic with any questions,  comments, or concerns.  Maximiano Coss, NP

## 2021-12-12 ENCOUNTER — Telehealth: Payer: Self-pay | Admitting: Family Medicine

## 2021-12-12 ENCOUNTER — Other Ambulatory Visit: Payer: Self-pay

## 2021-12-12 DIAGNOSIS — R300 Dysuria: Secondary | ICD-10-CM

## 2021-12-12 MED ORDER — CEPHALEXIN 500 MG PO CAPS: 500.0000 mg | ORAL_CAPSULE | Freq: Two times a day (BID) | ORAL | 0 refills | Status: DC

## 2021-12-12 NOTE — Telephone Encounter (Signed)
We can send in Keflex 500mg  BID x5 days, #10, no refills but she needs to schedule an appt next week so we can send her urine for culture and refer to urology if appropriate

## 2021-12-12 NOTE — Telephone Encounter (Signed)
Patient is aware and medication sent

## 2021-12-12 NOTE — Telephone Encounter (Signed)
Ongoing discomfort when urinating, especially when my bladder is nearly empty. Frequent urination.  Lower back pain, abdominal pain, bloating.  I have gone thru 2 1/2 rounds of antibiotics.    Please advise pt saw Richard on 12/02/21

## 2021-12-17 ENCOUNTER — Ambulatory Visit: Payer: Medicare HMO | Admitting: Family Medicine

## 2021-12-17 ENCOUNTER — Encounter: Payer: Self-pay | Admitting: *Deleted

## 2022-01-01 DIAGNOSIS — L308 Other specified dermatitis: Secondary | ICD-10-CM | POA: Diagnosis not present

## 2022-01-01 DIAGNOSIS — R300 Dysuria: Secondary | ICD-10-CM | POA: Diagnosis not present

## 2022-01-07 DIAGNOSIS — L900 Lichen sclerosus et atrophicus: Secondary | ICD-10-CM | POA: Insufficient documentation

## 2022-01-09 ENCOUNTER — Other Ambulatory Visit: Payer: Self-pay | Admitting: Family Medicine

## 2022-02-27 ENCOUNTER — Other Ambulatory Visit: Payer: Self-pay | Admitting: Family Medicine

## 2022-03-10 ENCOUNTER — Ambulatory Visit (INDEPENDENT_AMBULATORY_CARE_PROVIDER_SITE_OTHER): Payer: Medicare HMO | Admitting: Family Medicine

## 2022-03-10 ENCOUNTER — Other Ambulatory Visit: Payer: Self-pay

## 2022-03-10 ENCOUNTER — Encounter: Payer: Self-pay | Admitting: Family Medicine

## 2022-03-10 VITALS — BP 110/78 | HR 71 | Temp 97.6°F | Resp 16 | Wt 135.0 lb

## 2022-03-10 DIAGNOSIS — F419 Anxiety disorder, unspecified: Secondary | ICD-10-CM

## 2022-03-10 DIAGNOSIS — F32A Depression, unspecified: Secondary | ICD-10-CM | POA: Diagnosis not present

## 2022-03-10 DIAGNOSIS — E785 Hyperlipidemia, unspecified: Secondary | ICD-10-CM

## 2022-03-10 DIAGNOSIS — R69 Illness, unspecified: Secondary | ICD-10-CM | POA: Diagnosis not present

## 2022-03-10 LAB — CBC WITH DIFFERENTIAL/PLATELET
Basophils Absolute: 0 10*3/uL (ref 0.0–0.1)
Basophils Relative: 0.8 % (ref 0.0–3.0)
Eosinophils Absolute: 0 10*3/uL (ref 0.0–0.7)
Eosinophils Relative: 0.9 % (ref 0.0–5.0)
HCT: 39.8 % (ref 36.0–46.0)
Hemoglobin: 13.1 g/dL (ref 12.0–15.0)
Lymphocytes Relative: 29.9 % (ref 12.0–46.0)
Lymphs Abs: 1.3 10*3/uL (ref 0.7–4.0)
MCHC: 32.9 g/dL (ref 30.0–36.0)
MCV: 85.5 fl (ref 78.0–100.0)
Monocytes Absolute: 0.4 10*3/uL (ref 0.1–1.0)
Monocytes Relative: 8.3 % (ref 3.0–12.0)
Neutro Abs: 2.6 10*3/uL (ref 1.4–7.7)
Neutrophils Relative %: 60.1 % (ref 43.0–77.0)
Platelets: 216 10*3/uL (ref 150.0–400.0)
RBC: 4.66 Mil/uL (ref 3.87–5.11)
RDW: 16.1 % — ABNORMAL HIGH (ref 11.5–15.5)
WBC: 4.4 10*3/uL (ref 4.0–10.5)

## 2022-03-10 LAB — LIPID PANEL
Cholesterol: 176 mg/dL (ref 0–200)
HDL: 96.6 mg/dL (ref >39.00)
LDL Cholesterol: 68 mg/dL (ref 0–99)
NonHDL: 79.62
Total CHOL/HDL Ratio: 2
Triglycerides: 58 mg/dL (ref 0.0–149.0)
VLDL: 11.6 mg/dL (ref 0.0–40.0)

## 2022-03-10 LAB — HEPATIC FUNCTION PANEL
ALT: 15 U/L (ref 0–35)
AST: 15 U/L (ref 0–37)
Albumin: 4.4 g/dL (ref 3.5–5.2)
Alkaline Phosphatase: 111 U/L (ref 39–117)
Bilirubin, Direct: 0.1 mg/dL (ref 0.0–0.3)
Total Bilirubin: 0.5 mg/dL (ref 0.2–1.2)
Total Protein: 6.9 g/dL (ref 6.0–8.3)

## 2022-03-10 LAB — BASIC METABOLIC PANEL
BUN: 14 mg/dL (ref 6–23)
CO2: 27 mEq/L (ref 19–32)
Calcium: 9.1 mg/dL (ref 8.4–10.5)
Chloride: 102 mEq/L (ref 96–112)
Creatinine, Ser: 0.73 mg/dL (ref 0.40–1.20)
GFR: 84.07 mL/min (ref >60.00)
Glucose, Bld: 89 mg/dL (ref 70–99)
Potassium: 4 mEq/L (ref 3.5–5.1)
Sodium: 138 mEq/L (ref 135–145)

## 2022-03-10 LAB — TSH: TSH: 5.29 u[IU]/mL (ref 0.35–5.50)

## 2022-03-10 NOTE — Progress Notes (Signed)
? ?  Subjective:  ? ? Patient ID: Dana Bryan, female    DOB: Feb 20, 1951, 71 y.o.   MRN: [id number] ? ?HPI ?Hyperlipidemia- chronic problem, on Lipitor '20mg'$  daily.  Pt has not been exercising as much recently due to watching the grandkids.  Denies CP, SOB, abd pain, N/V. ? ?Anxiety/Depression- chronic problem, on Wellbutrin '300mg'$  and Buspar 7.'5mg'$  BID.  Pt reports mood is 'pretty good' despite increased stressors recently.  Sister passed away a few weeks ago.  Has been helping daughter with the grandkids (age 76 and 2) and is 'really tired'. ? ? ?Review of Systems ?For ROS see HPI  ? ?This visit occurred during the SARS-CoV-2 public health emergency.  Safety protocols were in place, including screening questions prior to the visit, additional usage of staff PPE, and extensive cleaning of exam room while observing appropriate contact time as indicated for disinfecting solutions.   ?   ?Objective:  ? Physical Exam ?Vitals reviewed.  ?Constitutional:   ?   General: She is not in acute distress. ?   Appearance: Normal appearance. She is well-developed. She is not ill-appearing.  ?HENT:  ?   Head: Normocephalic and atraumatic.  ?Eyes:  ?   Conjunctiva/sclera: Conjunctivae normal.  ?   Pupils: Pupils are equal, round, and reactive to light.  ?Neck:  ?   Thyroid: No thyromegaly.  ?Cardiovascular:  ?   Rate and Rhythm: Normal rate and regular rhythm.  ?   Pulses: Normal pulses.  ?   Heart sounds: Normal heart sounds. No murmur heard. ?Pulmonary:  ?   Effort: Pulmonary effort is normal. No respiratory distress.  ?   Breath sounds: Normal breath sounds.  ?Abdominal:  ?   General: There is no distension.  ?   Palpations: Abdomen is soft.  ?   Tenderness: There is no abdominal tenderness.  ?Musculoskeletal:  ?   Cervical back: Normal range of motion and neck supple.  ?   Right lower leg: No edema.  ?   Left lower leg: No edema.  ?Lymphadenopathy:  ?   Cervical: No cervical adenopathy.  ?Skin: ?   General: Skin is warm and  dry.  ?Neurological:  ?   Mental Status: She is alert and oriented to person, place, and time.  ?Psychiatric:     ?   Behavior: Behavior normal.  ? ? ? ? ? ?   ?Assessment & Plan:  ? ? ?

## 2022-03-10 NOTE — Patient Instructions (Addendum)
Schedule your complete physical in 6 months ?We'll notify you of your lab results and make any changes if needed ?Continue to work on healthy diet and regular exercise- you look great!!! ?Call with any questions or concerns ?Stay Safe! Stay Healthy! ?I'm so sorry for your loss ?Happy Spring!!! ?

## 2022-03-10 NOTE — Assessment & Plan Note (Signed)
Chronic problem.  Tolerating statin w/o difficulty.  Check labs.  Adjust meds prn  

## 2022-03-10 NOTE — Assessment & Plan Note (Signed)
Stable.  Pt lost her sister on 3/8 and is understandably sad.  Has a lot of stress with helping her daughter and grandkids.  Is attempting to set boundaries and find her balance.  No med changes at this time.  Will follow. ?

## 2022-03-12 ENCOUNTER — Encounter: Payer: Self-pay | Admitting: Family Medicine

## 2022-03-17 DIAGNOSIS — B078 Other viral warts: Secondary | ICD-10-CM | POA: Diagnosis not present

## 2022-03-17 DIAGNOSIS — L814 Other melanin hyperpigmentation: Secondary | ICD-10-CM | POA: Diagnosis not present

## 2022-03-17 DIAGNOSIS — L565 Disseminated superficial actinic porokeratosis (DSAP): Secondary | ICD-10-CM | POA: Diagnosis not present

## 2022-03-17 DIAGNOSIS — D1801 Hemangioma of skin and subcutaneous tissue: Secondary | ICD-10-CM | POA: Diagnosis not present

## 2022-03-17 DIAGNOSIS — L821 Other seborrheic keratosis: Secondary | ICD-10-CM | POA: Diagnosis not present

## 2022-03-17 DIAGNOSIS — D225 Melanocytic nevi of trunk: Secondary | ICD-10-CM | POA: Diagnosis not present

## 2022-03-17 DIAGNOSIS — L820 Inflamed seborrheic keratosis: Secondary | ICD-10-CM | POA: Diagnosis not present

## 2022-03-18 ENCOUNTER — Telehealth: Payer: Medicare HMO

## 2022-04-10 ENCOUNTER — Other Ambulatory Visit: Payer: Self-pay | Admitting: Family Medicine

## 2022-04-10 NOTE — Telephone Encounter (Signed)
Patient is requesting a refill of the following medications: ?Requested Prescriptions  ? ?Pending Prescriptions Disp Refills  ? busPIRone (BUSPAR) 7.5 MG tablet [Pharmacy Med Name: BUSPIRONE HCL 7.5 MG TABLET] 180 tablet 0  ?  Sig: TAKE 1 TABLET BY MOUTH TWICE A DAY  ? ? ?Date of patient request: 04/10/2022 ?Last office visit: 03/10/2022 ?Date of last refill: 01/09/2022 ?Last refill amount: 180 tablets  ?Follow up time period per chart: 09/14/2022 ? ?

## 2022-04-13 ENCOUNTER — Encounter: Payer: Self-pay | Admitting: Family Medicine

## 2022-04-16 ENCOUNTER — Encounter: Payer: Self-pay | Admitting: Family Medicine

## 2022-04-16 ENCOUNTER — Ambulatory Visit (INDEPENDENT_AMBULATORY_CARE_PROVIDER_SITE_OTHER): Payer: Medicare HMO | Admitting: Family Medicine

## 2022-04-16 VITALS — BP 118/70 | HR 65 | Temp 97.6°F | Resp 16 | Ht 64.0 in | Wt 132.2 lb

## 2022-04-16 DIAGNOSIS — L609 Nail disorder, unspecified: Secondary | ICD-10-CM

## 2022-04-16 NOTE — Progress Notes (Signed)
? ?  Subjective:  ? ? Patient ID: Dana Bryan, female    DOB: 1951/09/07, 71 y.o.   MRN: [id number] ? ?HPI ?? Nail fungus- removed nail polish in mid Feb.  Noticed nails were rough, dried out.  Has not had polish since then but nails continue to have white streaks.  Streaks are growing out and only remain on big toes. ? ? ?Review of Systems ?For ROS see HPI  ?   ?Objective:  ? Physical Exam ?Vitals reviewed.  ?Constitutional:   ?   General: She is not in acute distress. ?   Appearance: Normal appearance. She is not ill-appearing.  ?Skin: ?   General: Skin is warm and dry.  ?   Findings: No lesion or rash.  ?   Comments: Rough great toe nails bilaterally w/ some pitting from polish but no evidence of fungal infection.  ?Neurological:  ?   Mental Status: She is alert.  ? ? ? ? ? ?   ?Assessment & Plan:  ?Abnormality of nail surface- new.  Areas remaining on great toe nails are consistent w/ damage from polish and not a fungal infxn.  Pt very relieved.  No treatment needed. ? ?

## 2022-04-16 NOTE — Patient Instructions (Signed)
Follow up as needed or as scheduled ?Thankfully this is not a nail fungus and should continue to grow out ?It is ok to put polish on them ?Call with any questions or concerns ?Happy Spring!!! ?

## 2022-04-28 ENCOUNTER — Encounter: Payer: Self-pay | Admitting: Family Medicine

## 2022-04-28 DIAGNOSIS — N819 Female genital prolapse, unspecified: Secondary | ICD-10-CM | POA: Diagnosis not present

## 2022-04-28 DIAGNOSIS — L900 Lichen sclerosus et atrophicus: Secondary | ICD-10-CM | POA: Diagnosis not present

## 2022-05-23 DIAGNOSIS — R0789 Other chest pain: Secondary | ICD-10-CM | POA: Insufficient documentation

## 2022-05-24 ENCOUNTER — Other Ambulatory Visit: Payer: Self-pay | Admitting: Family Medicine

## 2022-06-01 ENCOUNTER — Other Ambulatory Visit: Payer: Self-pay

## 2022-06-01 DIAGNOSIS — F419 Anxiety disorder, unspecified: Secondary | ICD-10-CM

## 2022-06-01 MED ORDER — BUPROPION HCL ER (XL) 300 MG PO TB24: 300.0000 mg | ORAL_TABLET | Freq: Every day | ORAL | 1 refills | Status: DC

## 2022-06-11 ENCOUNTER — Ambulatory Visit (INDEPENDENT_AMBULATORY_CARE_PROVIDER_SITE_OTHER): Payer: Medicare HMO | Admitting: Family Medicine

## 2022-06-11 ENCOUNTER — Encounter: Payer: Self-pay | Admitting: Family Medicine

## 2022-06-11 ENCOUNTER — Telehealth: Payer: Self-pay

## 2022-06-11 VITALS — BP 130/80 | HR 68 | Temp 98.1°F | Resp 16 | Ht 64.0 in | Wt 135.5 lb

## 2022-06-11 DIAGNOSIS — R0602 Shortness of breath: Secondary | ICD-10-CM

## 2022-06-11 DIAGNOSIS — F419 Anxiety disorder, unspecified: Secondary | ICD-10-CM | POA: Diagnosis not present

## 2022-06-11 DIAGNOSIS — R079 Chest pain, unspecified: Secondary | ICD-10-CM

## 2022-06-11 DIAGNOSIS — R8290 Unspecified abnormal findings in urine: Secondary | ICD-10-CM

## 2022-06-11 DIAGNOSIS — I358 Other nonrheumatic aortic valve disorders: Secondary | ICD-10-CM

## 2022-06-11 DIAGNOSIS — I447 Left bundle-branch block, unspecified: Secondary | ICD-10-CM

## 2022-06-11 DIAGNOSIS — R69 Illness, unspecified: Secondary | ICD-10-CM | POA: Diagnosis not present

## 2022-06-11 DIAGNOSIS — F32A Depression, unspecified: Secondary | ICD-10-CM

## 2022-06-11 LAB — BASIC METABOLIC PANEL
BUN: 16 mg/dL (ref 6–23)
CO2: 30 mEq/L (ref 19–32)
Calcium: 9.6 mg/dL (ref 8.4–10.5)
Chloride: 101 mEq/L (ref 96–112)
Creatinine, Ser: 0.77 mg/dL (ref 0.40–1.20)
GFR: 78.71 mL/min (ref >60.00)
Glucose, Bld: 84 mg/dL (ref 70–99)
Potassium: 3.7 mEq/L (ref 3.5–5.1)
Sodium: 138 mEq/L (ref 135–145)

## 2022-06-11 LAB — CBC WITH DIFFERENTIAL/PLATELET
Basophils Absolute: 0 10*3/uL (ref 0.0–0.1)
Basophils Relative: 0.3 % (ref 0.0–3.0)
Eosinophils Absolute: 0.1 10*3/uL (ref 0.0–0.7)
Eosinophils Relative: 0.8 % (ref 0.0–5.0)
HCT: 40.4 % (ref 36.0–46.0)
Hemoglobin: 13.4 g/dL (ref 12.0–15.0)
Lymphocytes Relative: 15.5 % (ref 12.0–46.0)
Lymphs Abs: 1.1 10*3/uL (ref 0.7–4.0)
MCHC: 33.1 g/dL (ref 30.0–36.0)
MCV: 86.2 fl (ref 78.0–100.0)
Monocytes Absolute: 0.5 10*3/uL (ref 0.1–1.0)
Monocytes Relative: 7.1 % (ref 3.0–12.0)
Neutro Abs: 5.6 10*3/uL (ref 1.4–7.7)
Neutrophils Relative %: 76.3 % (ref 43.0–77.0)
Platelets: 214 10*3/uL (ref 150.0–400.0)
RBC: 4.68 Mil/uL (ref 3.87–5.11)
RDW: 17.1 % — ABNORMAL HIGH (ref 11.5–15.5)
WBC: 7.3 10*3/uL (ref 4.0–10.5)

## 2022-06-11 LAB — POCT URINALYSIS DIPSTICK
Glucose, UA: NEGATIVE
Protein, UA: NEGATIVE
Spec Grav, UA: 1.02 (ref 1.010–1.025)
Urobilinogen, UA: 0.2 E.U./dL
pH, UA: 6.5 (ref 5.0–8.0)

## 2022-06-11 LAB — HEPATIC FUNCTION PANEL
ALT: 15 U/L (ref 0–35)
AST: 13 U/L (ref 0–37)
Albumin: 4.3 g/dL (ref 3.5–5.2)
Alkaline Phosphatase: 113 U/L (ref 39–117)
Bilirubin, Direct: 0.2 mg/dL (ref 0.0–0.3)
Total Bilirubin: 0.5 mg/dL (ref 0.2–1.2)
Total Protein: 7.3 g/dL (ref 6.0–8.3)

## 2022-06-11 LAB — TSH: TSH: 3.55 u[IU]/mL (ref 0.35–5.50)

## 2022-06-11 MED ORDER — FLUOXETINE HCL 10 MG PO CAPS: 10.0000 mg | ORAL_CAPSULE | Freq: Every day | ORAL | 3 refills | Status: DC

## 2022-06-11 MED ORDER — ALPRAZOLAM 0.25 MG PO TABS: 0.2500 mg | ORAL_TABLET | Freq: Every day | ORAL | 0 refills | Status: DC | PRN

## 2022-06-11 MED ORDER — CEPHALEXIN 500 MG PO CAPS: 500.0000 mg | ORAL_CAPSULE | Freq: Two times a day (BID) | ORAL | 0 refills | Status: AC

## 2022-06-11 NOTE — Patient Instructions (Signed)
Follow up in 4-6 weeks to recheck mood We'll notify you of your lab results and make any changes if needed START the Fluoxetine once daily in addition to your other medication Make sure you are drinking LOTS of fluids We'll call you to schedule your Cardiology appt If you develop worsening chest pressure, shortness of breath, or other concerns- please go to the ER Call with any questions or concerns Hang in there!

## 2022-06-11 NOTE — Progress Notes (Unsigned)
   Subjective:    Patient ID: Dana Bryan, female    DOB: 04/27/1951, 71 y.o.   MRN: [id number]  HPI Pt has list of concerns today.  SOB- pt reports a number of weeks ago she had burning in her chest while she was outside.  Initially thought it was due to air quality but then noted she would have some SOB w/ bending forward to apply lotion to legs.  Increased fatigue.  Has had some chest pressure and heaviness- does not necessarily occur w/ exertion.  Can occur at rest.  Denies swelling of hands or feet unless extended time outside or w/ prolonged standing.    Increased irritability- pt reports she is irritable and then she gets tearful b/c she feels short and angry 'all the time'.  Decreased short term memory recently.  Currently on Wellbutrin XL '300mg'$  daily and Buspar 7.'5mg'$  BID.  Also has Alprazolam to use as needed and she uses this very sparingly.  Was previously on Sertraline and Fluoxetine.    Cloudy urine- pt reports last night she had very uncomfortable feeling after urinating.  'like I had to go but there was nothing left'.  Pt reports she was lying in bed and the pain finally required her to get up and sit back in the bathroom and only a 'few drops' came out.     Review of Systems For ROS see HPI     Objective:   Physical Exam        Assessment & Plan:

## 2022-06-11 NOTE — Telephone Encounter (Signed)
-----   Message from Midge Minium, MD sent at 06/11/2022 12:49 PM EDT ----- Your urine shows moderate white blood cells which can be suspicious for an infection.  I will start Keflex while we are waiting on the urine culture so you don't have to wait over the weekend.

## 2022-06-11 NOTE — Progress Notes (Incomplete)
   Subjective:    Patient ID: [name], female    DOB: 1951/09/15, 71 y.o.   MRN: [id number]  HPI Pt has list of concerns today.  SOB- pt reports a number of weeks ago she had burning in her chest while she was outside.  Initially thought it was due to air quality but then noted she would have some SOB w/ bending forward to apply lotion to legs.  Increased fatigue.  Has had some chest pressure and heaviness- does not necessarily occur w/ exertion.  Can occur at rest.  Denies swelling of hands or feet unless extended time outside or w/ prolonged standing.    Increased irritability- pt reports she is irritable and then she gets tearful b/c she feels short and angry 'all the time'.  Decreased short term memory recently.  Currently on Wellbutrin XL '300mg'$  daily and Buspar 7.'5mg'$  BID.  Also has Alprazolam to use as needed and she uses this very sparingly.  Was previously on Sertraline and Fluoxetine.    Cloudy urine- pt reports last night she had very uncomfortable feeling after urinating.  'like I had to go but there was nothing left'.  Pt reports she was lying in bed and the pain finally required her to get up and sit back in the bathroom and only a 'few drops' came out.     Review of Systems For ROS see HPI     Objective:   Physical Exam        Assessment & Plan:

## 2022-06-12 ENCOUNTER — Telehealth: Payer: Self-pay

## 2022-06-12 NOTE — Telephone Encounter (Signed)
Informed pt of lab results  

## 2022-06-14 DIAGNOSIS — I447 Left bundle-branch block, unspecified: Secondary | ICD-10-CM | POA: Insufficient documentation

## 2022-06-14 DIAGNOSIS — I358 Other nonrheumatic aortic valve disorders: Secondary | ICD-10-CM | POA: Insufficient documentation

## 2022-06-14 LAB — URINE CULTURE
MICRO NUMBER:: 13559881
SPECIMEN QUALITY:: ADEQUATE

## 2022-06-18 NOTE — Progress Notes (Signed)
Cardiology Office Note:   Date:  06/19/2022  NAME:  Dana Bryan    MRN: [id number] DOB:  1951-05-29   PCP:  Midge Minium, MD  Cardiologist:  [name], MD  Electrophysiologist:  None   Referring MD: Midge Minium, MD   Chief Complaint  Patient presents with   Chest Pain   History of Present Illness:   Dana Bryan is a 71 y.o. female with a hx of anxiety, hyperlipidemia who is being seen today for the evaluation of chest pain/murmur at the request of Midge Minium, MD.  She reports for the last 6 months she becomes short of breath with activity.  Does not occur at rest.  Can occur with strenuous activity.  She also describes constant pressure in her chest.  Symptoms last all day.  She reports she is able to go about her day.  Exertion does not worsen it.  Cessation of activity does not improve it.  No evidence of volume overload.  Found to have a new left bundle branch block.  EKG does demonstrate left bundle branch block with QRS 148 ms.  Medical history is significant for depression and anxiety.  Reports symptoms are well controlled.  She does not regularly exercise but is very active with her grandchildren.  She drives to [facility] several days per week.  She does have a family history of heart disease in her mother.  She is a former smoker of 30 years but quit several years ago.  She does drink alcohol daily but in moderation.  She reports no drug use.  She is retired Engineer, water.  She is married.  She has 2 children.  She has 2 grandchildren.  CV exam is normal today.  Cholesterol level are well controlled.  Not diabetic.  She does have a 2 out of 6 systolic ejection murmur.  Radiates into the carotids.  Suspect this is just mild aortic stenosis.  Echo 2020.  Largely normal.  Problem List LBBB T chol 179, HDL 96, LDL 68, TG 58   Past Medical History: Past Medical History:  Diagnosis Date   Anxiety    on meds   Basal cell  carcinoma    Cancer (HCC)    Cataract    no sx    Generalized headaches    Heart murmur    per Dr.Tabori   Hyperlipidemia    on meds   Reactive depression (situational)    on meds   Squamous cell skin cancer, finger    Vasomotor rhinitis     Past Surgical History: Past Surgical History:  Procedure Laterality Date   COLONOSCOPY  2008   normal    TONSILLECTOMY  1973    Current Medications: Current Meds  Medication Sig   ALPRAZolam (XANAX) 0.25 MG tablet Take 1 tablet (0.25 mg total) by mouth daily as needed.   Ascorbic Acid (VITAMIN C PO) Take 500 mg by mouth daily.   atorvastatin (LIPITOR) 20 MG tablet TAKE 1 TABLET BY MOUTH EVERY DAY   buPROPion (WELLBUTRIN XL) 300 MG 24 hr tablet Take 1 tablet (300 mg total) by mouth daily.   busPIRone (BUSPAR) 7.5 MG tablet TAKE 1 TABLET BY MOUTH TWICE A DAY   Cholecalciferol (VITAMIN D3 PO) Take 2,000 Units by mouth.   clobetasol cream (TEMOVATE) 1.75 % Apply 1 application. topically 2 (two) times daily.   FLUoxetine (PROZAC) 10 MG capsule Take 1 capsule (10 mg total) by mouth daily.  ibuprofen (ADVIL,MOTRIN) 200 MG tablet Take 400 mg by mouth daily as needed for headache.   metoprolol tartrate (LOPRESSOR) 100 MG tablet TAKE 1 TABLET 2 HR PRIOR TO CARDIAC PROCEDURE   zinc gluconate 50 MG tablet Take 50 mg by mouth daily.     Allergies:    Chlorhexidine gluconate   Social History: Social History   Socioeconomic History   Marital status: Married    Spouse name: Not on file   Number of children: 2   Years of education: Not on file   Highest education level: Not on file  Occupational History   Occupation: Retired Science writer  Tobacco Use   Smoking status: Former    Years: 30.00    Types: Cigarettes    Quit date: 01/22/2004    Years since quitting: 18.4   Smokeless tobacco: Never  Vaping Use   Vaping Use: Never used  Substance and Sexual Activity   Alcohol use: Yes    Alcohol/week: 15.0 standard drinks of alcohol    Types: 15  Standard drinks or equivalent per week    Comment: drinks 1-2 drinks daily   Drug use: Never   Sexual activity: Not on file  Other Topics Concern   Not on file  Social History Narrative   HSG, college Grad. Married '71. 2 Daughters- '77, '81. 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  '11 has returned to work- out of the home ( [place])   Social Determinants of [place] Strain: [place]  (01/27/2021)   Overall Financial Resource Strain (CARDIA)    Difficulty of Paying Living Expenses: Not hard at [place]: No [place] (01/27/2021)   Hunger Vital Sign    Worried About [place] in the Last Year: Never true    [place] in the Last Year: Never true  Transportation Needs: No Transportation Needs (01/27/2021)   PRAPARE - Hydrologist (Medical): No    Lack of Transportation (Non-Medical): No  Physical Activity: Inactive (01/27/2021)   Exercise Vital Sign    Days of Exercise per Week: 0 days    Minutes of Exercise per Session: 0 min  Stress: No Stress Concern Present (01/27/2021)   [place]    Feeling of Stress : Only a little  Social Connections: Moderately Isolated (01/27/2021)   Social Connection and Isolation Panel [NHANES]    Frequency of Communication with Friends and Family: More than three times a week    Frequency of Social Gatherings with Friends and Family: More than three times a week    Attends Religious Services: Never    Marine scientist or Organizations: No    Attends Music therapist: Never    Marital Status: Married     Family History: The patient's family history includes Alcohol abuse in her father; Heart attack in her mother; Hypertension in her father and mother. There is no history of Colon polyps, Colon cancer, Esophageal cancer, Stomach cancer, or Rectal cancer.  ROS:   All other  ROS reviewed and negative. Pertinent positives noted in the HPI.     EKGs/Labs/Other Studies Reviewed:   The following studies were personally reviewed by me today:  EKG: EKG reviewed from primary care physician office dated 06/11/2022.  That EKG demonstrates normal sinus rhythm heart rate 70 with a left bundle branch block QRS 148 ms.  Recent Labs:  06/11/2022: ALT 15; BUN 16; Creatinine, Ser 0.77; Hemoglobin 13.4; Platelets 214.0; Potassium 3.7; Sodium 138; TSH 3.55   Recent Lipid Panel    Component Value Date/Time   CHOL 176 03/10/2022 0922   TRIG 58.0 03/10/2022 0922   TRIG 79 12/16/2006 0919   HDL 96.60 03/10/2022 0922   CHOLHDL 2 03/10/2022 0922   VLDL 11.6 03/10/2022 0922   LDLCALC 68 03/10/2022 0922   LDLDIRECT 168.5 07/30/2011 0739    Physical Exam:   VS:  BP 128/84   Pulse 64   Ht '5\' 4"'$  (1.626 m)   Wt 134 lb 12.8 oz (61.1 kg)   SpO2 99%   BMI 23.14 kg/m    Wt Readings from Last 3 Encounters:  06/19/22 134 lb 12.8 oz (61.1 kg)  06/11/22 135 lb 8 oz (61.5 kg)  04/16/22 132 lb 4 oz (60 kg)    General: Well nourished, well developed, in no acute distress Head: Atraumatic, normal size  Eyes: PEERLA, EOMI  Neck: Supple, no JVD Endocrine: No thryomegaly Cardiac: Normal S1, S2; RRR; 2 out of 6 systolic ejection murmur, S2 is clearly heard, radiating to carotids Lungs: Clear to auscultation bilaterally, no wheezing, rhonchi or rales  Abd: Soft, nontender, no hepatomegaly  Ext: No edema, pulses 2+ Musculoskeletal: No deformities, BUE and BLE strength normal and equal Skin: Warm and dry, no rashes   Neuro: Alert and oriented to person, place, time, and situation, CNII-XII grossly intact, no focal deficits  Psych: Normal mood and affect   ASSESSMENT:   Dana Bryan is a 71 y.o. female who presents for the following: 1. Precordial pain   2. Murmur   3. SOB (shortness of breath)   4. LBBB (left bundle branch block)    PLAN:   1. Precordial pain 2.  Murmur 3. SOB (shortness of breath) 4. LBBB (left bundle branch block) -She describes constant chest discomfort.  EKG with left bundle branch block.  Not worsened by exertion or alleviated by rest.  Family history of heart disease is present.  She is a former smoker.  I recommended to proceed with coronary CTA to evaluate her chest pain symptoms.  She will take 100 mg of metoprolol tartrate 2 hours before the scan.  Overall suspect this could be acid reflux or musculoskeletal.  We will see the results of the scan.  She does have a murmur on exam which existed with mild aortic stenosis.  She needs an echocardiogram.  She is short of breath with activity.  Nonetheless she does maintain a high level of activity.  No signs of volume overload.  We will proceed with echo and coronary CT as above.  Recent thyroid studies are normal.  She is not anemic.  Symptoms could also be depression and anxiety related.  She is working with her primary care physician on this.  Her left bundle branch block is new.  Could be LV dyssynchrony.  Again echo will evaluate this.  We will go ahead and plan for 1 year follow-up for what I suspect is mild aortic stenosis.  She will see me back earlier if needed.  Disposition: Return in about 1 year (around 06/20/2023).  Medication Adjustments/Labs and Tests Ordered: Current medicines are reviewed at length with the patient today.  Concerns regarding medicines are outlined above.  Orders Placed This Encounter  Procedures   CT CORONARY MORPH W/CTA COR W/SCORE W/CA W/CM &/OR WO/CM   ECHOCARDIOGRAM COMPLETE   Meds ordered this encounter  Medications  metoprolol tartrate (LOPRESSOR) 100 MG tablet    Sig: TAKE 1 TABLET 2 HR PRIOR TO CARDIAC PROCEDURE    Dispense:  1 tablet    Refill:  0    Patient Instructions  Medication Instructions:  Your Physician recommend you continue on your current medication as directed.    *If you need a refill on your cardiac medications before your  next appointment, please call your pharmacy*   Lab Work: None ordered today   Testing/Procedures: Your physician has requested that you have an echocardiogram. Echocardiography is a painless test that uses sound waves to create images of your heart. It provides your doctor with information about the size and shape of your heart and how well your heart's chambers and valves are working. This procedure takes approximately one hour. There are no restrictions for this procedure. [place] [address]  Cardiac CT Angiography (CTA), is a special type of CT scan that uses a computer to produce multi-dimensional views of major blood vessels throughout the body. In CT angiography, a contrast material is injected through an IV to help visualize the blood vessels [facility]  Follow-Up: At [facility], you and your health needs are our priority.  As part of our continuing mission to provide you with exceptional heart care, we have created designated Provider Care Teams.  These Care Teams include your primary Cardiologist (physician) and Advanced Practice Providers (APPs -  Physician Assistants and Nurse Practitioners) who all work together to provide you with the care you need, when you need it.  We recommend signing up for the patient portal called "MyChart".  Sign up information is provided on this After Visit Summary.  MyChart is used to connect with patients for Virtual Visits (Telemedicine).  Patients are able to view lab/test results, encounter notes, upcoming appointments, etc.  Non-urgent messages can be sent to your provider as well.   To learn more about what you can do with MyChart, go to [email or web address].    Your next appointment:   1 year(s)  The format for your next appointment:   In Person  Provider:   [name], MD {    Your cardiac CT will be scheduled at one of the below locations:   [facility] [address] [place],  [place] [address] [phone]   If scheduled at [facility], please arrive at the [facility] and Children's Entrance (Entrance C2) of [facility] 30 minutes prior to test start time. You can use the FREE valet parking offered at entrance C (encouraged to control the heart rate for the test)  Proceed to the [facility] Radiology Department (first floor) to check-in and test prep.  All radiology patients and guests should use entrance C2 at [facility], accessed from [facility], even though the hospital's physical address listed is [address].    If scheduled at [facility], please arrive 15 mins early for check-in and test prep.  Please follow these instructions carefully (unless otherwise directed):  On the Night Before the Test: Be sure to Drink plenty of water. Do not consume any caffeinated/decaffeinated beverages or chocolate 12 hours prior to your test. Do not take any antihistamines 12 hours prior to your test.  On the Day of the Test: Drink plenty of water until 1 hour prior to the test. Do not eat any food 4 hours prior to the test. You may take your regular medications prior  to the test.  Take metoprolol (Lopressor) 100 mg two hours prior to test. FEMALES- please wear underwire-free bra if available, avoid dresses & tight clothing       After the Test: Drink plenty of water. After receiving IV contrast, you may experience a mild flushed feeling. This is normal. On occasion, you may experience a mild rash up to 24 hours after the test. This is not dangerous. If this occurs, you can take Benadryl 25 mg and increase your fluid intake. If you experience trouble breathing, this can be serious. If it is severe call 911 IMMEDIATELY. If it is mild, please call our office. If you take any of these medications: Glipizide/Metformin, Avandament, Glucavance, please do not take 48 hours after completing test unless  otherwise instructed.  We will call to schedule your test 2-4 weeks out understanding that some insurance companies will need an authorization prior to the service being performed.   For non-scheduling related questions, please contact the cardiac imaging nurse navigator should you have any questions/concerns: Marchia Bond, Cardiac Imaging Nurse Navigator Gordy Clement, Cardiac Imaging Nurse Navigator [facility] Direct Office Dial: [phone]   For scheduling needs, including cancellations and rescheduling, please call [place], [phone].          Signed, Addison Naegeli. Audie Box, MD, [place] [facility]  [address], [place],  [address] [phone]  06/19/2022 9:38 AM

## 2022-06-19 ENCOUNTER — Encounter: Payer: Self-pay | Admitting: Cardiovascular Disease

## 2022-06-19 ENCOUNTER — Ambulatory Visit: Payer: Medicare HMO | Admitting: Cardiovascular Disease

## 2022-06-19 VITALS — BP 128/84 | HR 64 | Ht 64.0 in | Wt 134.8 lb

## 2022-06-19 DIAGNOSIS — R0602 Shortness of breath: Secondary | ICD-10-CM | POA: Diagnosis not present

## 2022-06-19 DIAGNOSIS — R011 Cardiac murmur, unspecified: Secondary | ICD-10-CM | POA: Diagnosis not present

## 2022-06-19 DIAGNOSIS — R072 Precordial pain: Secondary | ICD-10-CM

## 2022-06-19 DIAGNOSIS — I447 Left bundle-branch block, unspecified: Secondary | ICD-10-CM

## 2022-06-19 MED ORDER — METOPROLOL TARTRATE 100 MG PO TABS: ORAL_TABLET | ORAL | 0 refills | Status: DC

## 2022-06-19 NOTE — Patient Instructions (Addendum)
Medication Instructions:  Your Physician recommend you continue on your current medication as directed.    *If you need a refill on your cardiac medications before your next appointment, please call your pharmacy*   Lab Work: None ordered today   Testing/Procedures: Your physician has requested that you have an echocardiogram. Echocardiography is a painless test that uses sound waves to create images of your heart. It provides your doctor with information about the size and shape of your heart and how well your heart's chambers and valves are working. This procedure takes approximately one hour. There are no restrictions for this procedure. Dana Bryan [address]  Cardiac CT Angiography (CTA), is a special type of CT scan that uses a computer to produce multi-dimensional views of major blood vessels throughout the body. In CT angiography, a contrast material is injected through an IV to help visualize the blood vessels [facility]  Follow-Up: At [facility], you and your health needs are our priority.  As part of our continuing mission to provide you with exceptional heart care, we have created designated Provider Care Teams.  These Care Teams include your primary Cardiologist (physician) and Advanced Practice Providers (APPs -  Physician Assistants and Nurse Practitioners) who all work together to provide you with the care you need, when you need it.  We recommend signing up for the patient portal called "MyChart".  Sign up information is provided on this After Visit Summary.  MyChart is used to connect with patients for Virtual Visits (Telemedicine).  Patients are able to view lab/test results, encounter notes, upcoming appointments, etc.  Non-urgent messages can be sent to your provider as well.   To learn more about what you can do with MyChart, go to [email or web address].    Your next appointment:   1 year(s)  The format for your next appointment:   In  Person  Provider:   [name], MD {    Your cardiac CT will be scheduled at one of the below locations:   [facility] [address] [place], [place] [address] [phone]   If scheduled at [facility], please arrive at the [facility] and Children's Entrance (Entrance C2) of [facility] 30 minutes prior to test start time. You can use the FREE valet parking offered at entrance C (encouraged to control the heart rate for the test)  Proceed to the [facility] Radiology Department (first floor) to check-in and test prep.  All radiology patients and guests should use entrance C2 at [facility], accessed from [facility], even though the hospital's physical address listed is [address]    If scheduled at [facility], please arrive 15 mins early for check-in and test prep.  Please follow these instructions carefully (unless otherwise directed):  On the Night Before the Test: Be sure to Drink plenty of water. Do not consume any caffeinated/decaffeinated beverages or chocolate 12 hours prior to your test. Do not take any antihistamines 12 hours prior to your test.  On the Day of the Test: Drink plenty of water until 1 hour prior to the test. Do not eat any food 4 hours prior to the test. You may take your regular medications prior to the test.  Take metoprolol (Lopressor) 100 mg two hours prior to test. FEMALES- please wear underwire-free bra if available, avoid dresses & tight clothing       After the Test: Drink plenty of water.  After receiving IV contrast, you may experience a mild flushed feeling. This is normal. On occasion, you may experience a mild rash up to 24 hours after the test. This is not dangerous. If this occurs, you can take Benadryl 25 mg and increase your fluid intake. If you experience trouble breathing, this can be serious. If it is severe call 911 IMMEDIATELY. If it is  mild, please call our office. If you take any of these medications: Glipizide/Metformin, Avandament, Glucavance, please do not take 48 hours after completing test unless otherwise instructed.  We will call to schedule your test 2-4 weeks out understanding that some insurance companies will need an authorization prior to the service being performed.   For non-scheduling related questions, please contact the cardiac imaging nurse navigator should you have any questions/concerns: Marchia Bond, Cardiac Imaging Nurse Navigator Gordy Clement, Cardiac Imaging Nurse Navigator [facility] Direct Office Dial: [phone]   For scheduling needs, including cancellations and rescheduling, please call [place], [phone].

## 2022-06-27 ENCOUNTER — Encounter: Payer: Self-pay | Admitting: Family Medicine

## 2022-07-02 ENCOUNTER — Other Ambulatory Visit: Payer: Self-pay | Admitting: Family

## 2022-07-02 DIAGNOSIS — B351 Tinea unguium: Secondary | ICD-10-CM

## 2022-07-06 ENCOUNTER — Ambulatory Visit (HOSPITAL_COMMUNITY): Payer: Medicare HMO | Attending: Internal Medicine

## 2022-07-06 DIAGNOSIS — R011 Cardiac murmur, unspecified: Secondary | ICD-10-CM | POA: Diagnosis not present

## 2022-07-06 DIAGNOSIS — I361 Nonrheumatic tricuspid (valve) insufficiency: Secondary | ICD-10-CM | POA: Diagnosis not present

## 2022-07-06 LAB — ECHOCARDIOGRAM COMPLETE
AR max vel: 1.12 cm2
AV Area VTI: 1.1 cm2
AV Area mean vel: 1.07 cm2
AV Mean grad: 18 mmHg
AV Peak grad: 30.5 mmHg
Ao pk vel: 2.76 m/s
Area-P 1/2: 3.42 cm2
S' Lateral: 3.5 cm

## 2022-07-08 ENCOUNTER — Telehealth (HOSPITAL_COMMUNITY): Payer: Self-pay | Admitting: Emergency Medicine

## 2022-07-08 NOTE — Telephone Encounter (Signed)
Attempted to call patient regarding upcoming cardiac CT appointment. °Left message on voicemail with name and callback number °Jahmeek Shirk RN Navigator Cardiac Imaging °[facility] °[phone] Office °[phone] Cell ° °

## 2022-07-09 ENCOUNTER — Telehealth (HOSPITAL_COMMUNITY): Payer: Self-pay | Admitting: *Deleted

## 2022-07-09 ENCOUNTER — Ambulatory Visit (INDEPENDENT_AMBULATORY_CARE_PROVIDER_SITE_OTHER): Payer: Medicare HMO | Admitting: Family Medicine

## 2022-07-09 ENCOUNTER — Encounter: Payer: Self-pay | Admitting: Family Medicine

## 2022-07-09 VITALS — BP 138/80 | HR 80 | Temp 98.2°F | Resp 16 | Ht 64.0 in | Wt 132.5 lb

## 2022-07-09 DIAGNOSIS — F32A Depression, unspecified: Secondary | ICD-10-CM | POA: Diagnosis not present

## 2022-07-09 DIAGNOSIS — F419 Anxiety disorder, unspecified: Secondary | ICD-10-CM

## 2022-07-09 DIAGNOSIS — R69 Illness, unspecified: Secondary | ICD-10-CM | POA: Diagnosis not present

## 2022-07-09 NOTE — Telephone Encounter (Signed)
Patient returning call regarding upcoming cardiac imaging study; pt verbalizes understanding of appt date/time, parking situation and where to check in, pre-test NPO status and medications ordered, and verified current allergies; name and call back number provided for further questions should they arise  Gordy Clement RN Navigator Cardiac Imaging [facility] [phone] office [phone] cell  Patient to take '100mg'$  metoprolol tartrate two hours prior to her cardiac CT scan. She is aware to arrive at 2:30pm.

## 2022-07-09 NOTE — Assessment & Plan Note (Signed)
Pt reports feeling much better after starting Fluoxetine '10mg'$  at last visit.  This is in addition to her Wellbutrin XL '300mg'$  daily and Buspar 7.'5mg'$  BID.  Currently feels she is in a good place and no med adjustments are needed.  Will follow.
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
## 2022-07-09 NOTE — Patient Instructions (Addendum)
Follow up as scheduled or as needed No med changes at this time Keep up the good work!  You look great! Call with any questions or concerns Hang in there!!!

## 2022-07-10 ENCOUNTER — Ambulatory Visit (HOSPITAL_COMMUNITY)
Admission: RE | Admit: 2022-07-10 | Discharge: 2022-07-10 | Disposition: A | Discharge status: Home / Self Care | Payer: Medicare HMO | Source: Ambulatory Visit | Attending: Cardiovascular Disease | Admitting: Cardiovascular Disease

## 2022-07-10 ENCOUNTER — Other Ambulatory Visit: Payer: Self-pay | Admitting: Family Medicine

## 2022-07-10 DIAGNOSIS — R072 Precordial pain: Secondary | ICD-10-CM | POA: Insufficient documentation

## 2022-07-10 MED ORDER — NITROGLYCERIN 0.4 MG SL SUBL
0.8000 mg | SUBLINGUAL_TABLET | Freq: Once | SUBLINGUAL | Status: AC
  Administered 2022-07-10: 0.8 mg via SUBLINGUAL

## 2022-07-10 MED ORDER — IOHEXOL 350 MG/ML SOLN
100.0000 mL | Freq: Once | INTRAVENOUS | Status: AC | PRN
  Administered 2022-07-10: 100 mL via INTRAVENOUS

## 2022-07-10 MED ORDER — NITROGLYCERIN 0.4 MG SL SUBL
SUBLINGUAL_TABLET | SUBLINGUAL | Status: AC
  Filled 2022-07-10: qty 2

## 2022-07-16 ENCOUNTER — Encounter: Payer: Self-pay | Admitting: Family Medicine

## 2022-07-21 ENCOUNTER — Encounter: Payer: Self-pay | Admitting: Family Medicine

## 2022-07-22 NOTE — Progress Notes (Unsigned)
Cardiology Office Note:   Date:  07/23/2022  NAME:  Dana Bryan    MRN: [id number] DOB:  05/28/1951   PCP:  Midge Minium, MD  Cardiologist:  [name], MD  Electrophysiologist:  None   Referring MD: Midge Minium, MD   Chief Complaint  Patient presents with   Follow-up   History of Present Illness:   Dana Bryan is a 71 y.o. female with a hx of LBBB, bicuspid aortic valve who presents for follow-up. Seen for SOB and LBBB. Echo with mild AS. CCTA with minimal CAD and bicuspid aortic valve. Concern for LAA thrombus as well. Needs TEE.  She presents with her husband.  Still getting short of breath intermittently.  Can occur at rest or with strenuous activity.  Not exercising that much.  Very active with her grandchildren in [place].  Denies chest pain.  Coronary CTA with nonobstructive CAD.  Bicuspid aortic valve.  Echocardiogram demonstrated mild aortic stenosis but there was concern for possibly more severe AS.  Her murmur is prominent but not that impressive.  There is a bit of a delayed and S2 sound.  It is more prominent at the right lower sternal border.  Suspect echo may have missed the most prominent jet.  We discussed proceeding with transesophageal echocardiogram.  We also discussed benign hemangioma in her liver as well as atelectasis in the lungs.  We also discussed concerns for left atrial appendage thrombus which on my review is not present.  Transesophageal echo can further elucidate all of these findings.  We will proceed with this.  She is willing to proceed.  Problem List LBBB T chol 179, HDL 96, LDL 68, TG 58  Aortic stenosis/Bicuspid aortic valve  Non-obstructive CAD -minimal CAD CCTA 07/13/2022  Past Medical History: Past Medical History:  Diagnosis Date   Anxiety    on meds   Basal cell carcinoma    Cancer (HCC)    Cataract    no sx    Generalized headaches    Heart murmur    per Dr.Tabori   Hyperlipidemia     on meds   Reactive depression (situational)    on meds   Squamous cell skin cancer, finger    Vasomotor rhinitis     Past Surgical History: Past Surgical History:  Procedure Laterality Date   COLONOSCOPY  2008   normal    TONSILLECTOMY  1973    Current Medications: Current Meds  Medication Sig   ALPRAZolam (XANAX) 0.25 MG tablet Take 1 tablet (0.25 mg total) by mouth daily as needed.   Ascorbic Acid (VITAMIN C PO) Take 500 mg by mouth daily.   atorvastatin (LIPITOR) 20 MG tablet TAKE 1 TABLET BY MOUTH EVERY DAY   buPROPion (WELLBUTRIN XL) 300 MG 24 hr tablet Take 1 tablet (300 mg total) by mouth daily.   busPIRone (BUSPAR) 7.5 MG tablet TAKE 1 TABLET BY MOUTH TWICE A DAY   Cholecalciferol (VITAMIN D3 PO) Take 2,000 Units by mouth.   clobetasol cream (TEMOVATE) 2.94 % Apply 1 application. topically 2 (two) times daily.   ibuprofen (ADVIL,MOTRIN) 200 MG tablet Take 400 mg by mouth daily as needed for headache.   zinc gluconate 50 MG tablet Take 50 mg by mouth daily.   [DISCONTINUED] FLUoxetine (PROZAC) 10 MG capsule Take 1 capsule (10 mg total) by mouth daily.     Allergies:    Chlorhexidine gluconate   Social History: Social History   Socioeconomic History  Marital status: Married    Spouse name: Not on file   Number of children: 2   Years of education: Not on file   Highest education level: Not on file  Occupational History   Occupation: Retired Science writer  Tobacco Use   Smoking status: Former    Years: 30.00    Types: Cigarettes    Quit date: 01/22/2004    Years since quitting: 18.5   Smokeless tobacco: Never  Vaping Use   Vaping Use: Never used  Substance and Sexual Activity   Alcohol use: Yes    Alcohol/week: 15.0 standard drinks of alcohol    Types: 15 Standard drinks or equivalent per week    Comment: drinks 1-2 drinks daily   Drug use: Never   Sexual activity: Not on file  Other Topics Concern   Not on file  Social History Narrative   HSG, college  Grad. Married '71. 2 Daughters- '77, '81. 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  '11 has returned to work- out of the home ( [place])   Social Determinants of [place] Strain: [place]  (01/27/2021)   Overall Financial Resource Strain (CARDIA)    Difficulty of Paying Living Expenses: Not hard at [place]: No [place] (01/27/2021)   Hunger Vital Sign    Worried About [place] in the Last Year: Never true    [place] in the Last Year: Never true  Transportation Needs: No Transportation Needs (01/27/2021)   PRAPARE - Hydrologist (Medical): No    Lack of Transportation (Non-Medical): No  Physical Activity: Inactive (01/27/2021)   Exercise Vital Sign    Days of Exercise per Week: 0 days    Minutes of Exercise per Session: 0 min  Stress: No Stress Concern Present (01/27/2021)   [place]    Feeling of Stress : Only a little  Social Connections: Moderately Isolated (01/27/2021)   Social Connection and Isolation Panel [NHANES]    Frequency of Communication with Friends and Family: More than three times a week    Frequency of Social Gatherings with Friends and Family: More than three times a week    Attends Religious Services: Never    Marine scientist or Organizations: No    Attends Music therapist: Never    Marital Status: Married     Family History: The patient's family history includes Alcohol abuse in her father; Heart attack in her mother; Hypertension in her father and mother. There is no history of Colon polyps, Colon cancer, Esophageal cancer, Stomach cancer, or Rectal cancer.  ROS:   All other ROS reviewed and negative. Pertinent positives noted in the HPI.     EKGs/Labs/Other Studies Reviewed:   The following studies were personally reviewed by me today:  EKG:  EKG is ordered today.  The ekg  ordered today demonstrates normal sinus rhythm heart rate 65, left bundle branch block, and was personally reviewed by me.   Recent Labs: 06/11/2022: ALT 15; BUN 16; Creatinine, Ser 0.77; Hemoglobin 13.4; Platelets 214.0; Potassium 3.7; Sodium 138; TSH 3.55   Recent Lipid Panel    Component Value Date/Time   CHOL 176 03/10/2022 0922   TRIG 58.0 03/10/2022 0922   TRIG 79 12/16/2006 0919   HDL 96.60 03/10/2022 0922   CHOLHDL 2 03/10/2022 0922   VLDL 11.6 03/10/2022 5625  LDLCALC 68 03/10/2022 0922   LDLDIRECT 168.5 07/30/2011 0739    Physical Exam:   VS:  BP (!) 138/90 (BP Location: Right Arm, Patient Position: Sitting, Cuff Size: Normal)   Pulse 65   Ht '5\' 4"'$  (1.626 m)   Wt 133 lb (60.3 kg)   BMI 22.83 kg/m    Wt Readings from Last 3 Encounters:  07/23/22 133 lb (60.3 kg)  07/09/22 132 lb 8 oz (60.1 kg)  06/19/22 134 lb 12.8 oz (61.1 kg)    General: Well nourished, well developed, in no acute distress Head: Atraumatic, normal size  Eyes: PEERLA, EOMI  Neck: Supple, no JVD Endocrine: No thryomegaly Cardiac: Normal S1, S2; RRR; 3-6 systolic ejection murmur,  Lungs: Clear to auscultation bilaterally, no wheezing, rhonchi or rales  Abd: Soft, nontender, no hepatomegaly  Ext: No edema, pulses 2+ Musculoskeletal: No deformities, BUE and BLE strength normal and equal Skin: Warm and dry, no rashes   Neuro: Alert and oriented to person, place, time, and situation, CNII-XII grossly intact, no focal deficits  Psych: Normal mood and affect   ASSESSMENT:   Dana Bryan is a 71 y.o. female who presents for the following: 1. Bicuspid aortic valve   2. Aortic stenosis due to bicuspid aortic valve     PLAN:   1. Bicuspid aortic valve 2. Aortic stenosis due to bicuspid aortic valve -Bicuspid aortic valve on CT scan.  Not that fully evaluated on echo.  Also concerns for left atrial appendage thrombus on CT.  She does not have A-fib.  She has left bundle branch block.  I am  not concerned for left atrial appendage thrombus.  Her murmur is prominent.  Valve looks a bit narrowed on CT.  Echo did not fully elucidate this.  She is having shortness of breath which could be related to her valve.  Therefore have recommended to proceed with transesophageal echocardiogram.  Risk and benefits explained.  She is a great candidate for this procedure.  We will set her up on September 04, 2022.  She was counseled that her family members need to be screened for a bicuspid aortic valve.  She has no evidence of aortic disease.  Shared Decision Making/Informed Consent The risks [esophageal damage, perforation (1:10,000 risk), bleeding, pharyngeal hematoma as well as other potential complications associated with conscious sedation including aspiration, arrhythmia, respiratory failure and death], benefits (treatment guidance and diagnostic support) and alternatives of a transesophageal echocardiogram were discussed in detail with Dana Bryan and she is willing to proceed.   Disposition: Return in about 1 year (around 07/24/2023).  Medication Adjustments/Labs and Tests Ordered: Current medicines are reviewed at length with the patient today.  Concerns regarding medicines are outlined above.  Orders Placed This Encounter  Procedures   CBC   Basic metabolic panel   EKG [id number]   No orders of the defined types were placed in this encounter.   Patient Instructions  Medication Instructions:  The current medical regimen is effective;  continue present plan and medications.  *If you need a refill on your cardiac medications before your next appointment, please call your pharmacy*   Lab Work: CBC, BMET 1 week before TEE (week of Septebmer 8th)   If you have labs (blood work) drawn today and your tests are completely normal, you will receive your results only by: [place] (if you have MyChart) OR A paper copy in the mail If you have any lab test that is abnormal or we need to  change your treatment, we will call you to review the results.   Testing/Procedures: Your physician has requested that you have a TEE. During a TEE, sound waves are used to create images of your heart. It provides your doctor with information about the size and shape of your heart and how well your heart's chambers and valves are working. In this test, a transducer is attached to the end of a flexible tube that's guided down your throat and into your esophagus (the tube leading from you mouth to your stomach) to get a more detailed image of your heart. You are not awake for the procedure. Please see the instruction sheet given to you today. For further information please visit [email or web address].    Follow-Up: At [facility], you and your health needs are our priority.  As part of our continuing mission to provide you with exceptional heart care, we have created designated Provider Care Teams.  These Care Teams include your primary Cardiologist (physician) and Advanced Practice Providers (APPs -  Physician Assistants and Nurse Practitioners) who all work together to provide you with the care you need, when you need it.  We recommend signing up for the patient portal called "MyChart".  Sign up information is provided on this After Visit Summary.  MyChart is used to connect with patients for Virtual Visits (Telemedicine).  Patients are able to view lab/test results, encounter notes, upcoming appointments, etc.  Non-urgent messages can be sent to your provider as well.   To learn more about what you can do with MyChart, go to [email or web address].    Your next appointment:   12 months  The format for your next appointment:   In Person  Provider:   [name], MD    Other Instructions  You are scheduled for a TEE on September 15 th with Dr. Audie Box.  Please arrive at the [facility] (Main Entrance A) at [facility]: [place], [place] [address] at 6:30 am. (1  hour prior to procedure unless lab work is needed; if lab work is needed arrive 1.5 hours ahead)  DIET: Nothing to eat or drink after midnight except a sip of water with medications (see medication instructions below)  FYI: For your safety, and to allow [place] to monitor your vital signs accurately during the surgery/procedure we request that   if you have artificial nails, gel coating, SNS etc. Please have those removed prior to your surgery/procedure. Not having the nail coverings /polish removed may result in cancellation or delay of your surgery/procedure.   Medication Instructions:   Continue your anticoagulant: N/A You will need to continue your anticoagulant after your procedure until you  are told by your  Provider that it is safe to stop   Labs: CBC, BMET 1 week before (week of September 8th)    You must have a responsible person to drive you home and stay in the waiting area during your procedure. Failure to do so could result in cancellation.  Bring your insurance cards.  *Special Note: Every effort is made to have your procedure done on time. Occasionally there are emergencies that occur at the hospital that may cause delays. Please be patient if a delay does occur.            Time Spent with Patient: I have spent a total of 35 minutes with patient reviewing hospital notes, telemetry, EKGs, labs and examining the patient as well as establishing an assessment and plan that was discussed  with the patient.  > 50% of time was spent in direct patient care.  Signed, Addison Naegeli. Audie Box, MD, [place]  [address] [place],  [address] [phone]  07/23/2022 9:55 AM

## 2022-07-23 ENCOUNTER — Ambulatory Visit (INDEPENDENT_AMBULATORY_CARE_PROVIDER_SITE_OTHER): Payer: Medicare HMO

## 2022-07-23 ENCOUNTER — Encounter: Payer: Self-pay | Admitting: Cardiovascular Disease

## 2022-07-23 ENCOUNTER — Ambulatory Visit: Payer: Medicare HMO | Admitting: Cardiovascular Disease

## 2022-07-23 VITALS — BP 138/90 | HR 65 | Ht 64.0 in | Wt 133.0 lb

## 2022-07-23 DIAGNOSIS — Q231 Congenital insufficiency of aortic valve: Secondary | ICD-10-CM

## 2022-07-23 DIAGNOSIS — Z0000 Encounter for general adult medical examination without abnormal findings: Secondary | ICD-10-CM

## 2022-07-23 DIAGNOSIS — Q230 Congenital stenosis of aortic valve: Secondary | ICD-10-CM

## 2022-07-23 DIAGNOSIS — I350 Nonrheumatic aortic (valve) stenosis: Secondary | ICD-10-CM

## 2022-07-23 NOTE — Progress Notes (Signed)
Subjective:   Dana Bryan is a 71 y.o. female who presents for Medicare Annual (Subsequent) preventive examination.   I connected with Mariane Masters  today by telephone and verified that I am speaking with the correct person using two identifiers. Location patient: home Location provider: work Persons participating in the virtual visit: patient, provider.   I discussed the limitations, risks, security and privacy concerns of performing an evaluation and management service by telephone and the availability of in person appointments. I also discussed with the patient that there may be a patient responsible charge related to this service. The patient expressed understanding and verbally consented to this telephonic visit.    Interactive audio and video telecommunications were attempted between this provider and patient, however failed, due to patient having technical difficulties OR patient did not have access to video capability.  We continued and completed visit with audio only.    Review of Systems     Cardiac Risk Factors include: advanced age (>74mn, >>98women)     Objective:    Today's Vitals   There is no height or weight on file to calculate BMI.     07/23/2022   11:43 AM 01/27/2021    1:38 PM 03/21/2019    7:03 PM  Advanced Directives  Does Patient Have a Medical Advance Directive? Yes Yes No  Type of AParamedicof [place]Living will [place] will   Copy of H[place] Chart? No - copy requested Yes - validated most recent copy scanned in chart (See row information)   Would patient like information on creating a medical advance directive?   No - Patient declined    Current Medications (verified) Outpatient Encounter Medications as of 07/23/2022  Medication Sig   ALPRAZolam (XANAX) 0.25 MG tablet Take 1 tablet (0.25 mg total) by mouth daily as needed.   Ascorbic Acid (VITAMIN C PO) Take 500  mg by mouth daily.   atorvastatin (LIPITOR) 20 MG tablet TAKE 1 TABLET BY MOUTH EVERY DAY   buPROPion (WELLBUTRIN XL) 300 MG 24 hr tablet Take 1 tablet (300 mg total) by mouth daily.   busPIRone (BUSPAR) 7.5 MG tablet TAKE 1 TABLET BY MOUTH TWICE A DAY   Cholecalciferol (VITAMIN D3 PO) Take 2,000 Units by mouth.   clobetasol cream (TEMOVATE) 02.54% Apply 1 application. topically 2 (two) times daily.   ibuprofen (ADVIL,MOTRIN) 200 MG tablet Take 400 mg by mouth daily as needed for headache.   zinc gluconate 50 MG tablet Take 50 mg by mouth daily.   No facility-administered encounter medications on file as of 07/23/2022.    Allergies (verified) Chlorhexidine gluconate   History: Past Medical History:  Diagnosis Date   Anxiety    on meds   Basal cell carcinoma    Cancer (HCC)    Cataract    no sx    Generalized headaches    Heart murmur    per Dr.Tabori   Hyperlipidemia    on meds   Reactive depression (situational)    on meds   Squamous cell skin cancer, finger    Vasomotor rhinitis    Past Surgical History:  Procedure Laterality Date   COLONOSCOPY  2008   normal    TONSILLECTOMY  1973   Family History  Problem Relation Age of Onset   Heart attack Mother    Hypertension Mother    Hypertension Father    Alcohol abuse Father    Colon  polyps Neg Hx    Colon cancer Neg Hx    Esophageal cancer Neg Hx    Stomach cancer Neg Hx    Rectal cancer Neg Hx    Social History   Socioeconomic History   Marital status: Married    Spouse name: Not on file   Number of children: 2   Years of education: Not on file   Highest education level: Not on file  Occupational History   Occupation: Retired Science writer  Tobacco Use   Smoking status: Former    Years: 30.00    Types: Cigarettes    Quit date: 01/22/2004    Years since quitting: 18.5   Smokeless tobacco: Never  Vaping Use   Vaping Use: Never used  Substance and Sexual Activity   Alcohol use: Yes    Alcohol/week: 15.0  standard drinks of alcohol    Types: 15 Standard drinks or equivalent per week    Comment: drinks 1-2 drinks daily   Drug use: Never   Sexual activity: Not on file  Other Topics Concern   Not on file  Social History Narrative   HSG, college Grad. Married '71. 2 Daughters- '77, '81. 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, occupational)   Social Determinants of [place] Strain: [place]  (07/23/2022)   Overall Financial Resource Strain (CARDIA)    Difficulty of Paying Living Expenses: Not hard at all  Food Insecurity: No [place] (07/23/2022)   Hunger Vital Sign    Worried About Running Out of Food in the Last Year: Never true    [place] in the Last Year: Never true  Transportation Needs: No Transportation Needs (07/23/2022)   PRAPARE - Hydrologist (Medical): No    Lack of Transportation (Non-Medical): No  Physical Activity: Insufficiently Active (07/23/2022)   Exercise Vital Sign    Days of Exercise per Week: 3 days    Minutes of Exercise per Session: 30 min  Stress: No Stress Concern Present (07/23/2022)   [place]    Feeling of Stress : Not at all  Social Connections: Moderately Isolated (07/23/2022)   Social Connection and Isolation Panel [NHANES]    Frequency of Communication with Friends and Family: Three times a week    Frequency of Social Gatherings with Friends and Family: Three times a week    Attends Religious Services: Never    Active Member of Clubs or Organizations: No    Attends Music therapist: Never    Marital Status: Married    Tobacco Counseling Counseling given: Not Answered   Clinical Intake:  Pre-visit preparation completed: Yes  Pain : No/denies pain     Nutritional Risks: None Diabetes: No  How often do you need to have someone help you when you  read instructions, pamphlets, or other written materials from your doctor or pharmacy?: 1 - Never What is the last grade level you completed in school?: [place]   Interpreter Needed?: No  Information entered by :: L.Wilson,LPN   Activities of Daily Living    07/23/2022   11:46 AM 07/23/2022   11:36 AM  In your present state of health, do you have any difficulty performing the following activities:  Hearing? 0 0  Vision? 0 1  Difficulty concentrating or making decisions? 0 1  Walking  or climbing stairs? 0 0  Dressing or bathing? 0 0  Doing errands, shopping? 0 0  Preparing Food and eating ? N N  Using the Toilet? N N  In the past six months, have you accidently leaked urine? N   Do you have problems with loss of bowel control? N N  Managing your Medications? N N  Managing your Finances? N N  Housekeeping or managing your Housekeeping? N N    Patient Care Team: Midge Minium, MD as PCP - General (Family Medicine) O'Neal, Cassie Freer, MD as PCP - Cardiology (Cardiology) Paula Compton, MD as Consulting Physician (Obstetrics and Gynecology) Havery Moros, Carlota Raspberry, MD as Consulting Physician (Gastroenterology) Madelin Rear, [facility] as Pharmacist (Pharmacist)  Indicate any recent Medical Services you may have received from other than [facility] providers in the past year (date may be approximate).     Assessment:   This is a routine wellness examination for Te[place].  Hearing/Vision screen Vision Screening - Comments:: Annual eye exams wear glasses   Dietary issues and exercise activities discussed: Current Exercise Habits: Home exercise routine, Type of exercise: walking, Time (Minutes): 30, Frequency (Times/Week): 3, Weekly Exercise (Minutes/Week): 90, Intensity: Mild, Exercise limited by: None identified   Goals Addressed   None    Depression Screen    07/23/2022   11:43 AM 07/23/2022   11:41 AM 07/09/2022   10:17 AM 06/11/2022   10:41 AM 04/16/2022     8:22 AM 02/07/2021    8:26 AM 01/27/2021    1:40 PM  PHQ 2/9 Scores  PHQ - 2 Score 0 0 0 6 2 0 1  PHQ- 9 Score   '3 11 4 '$ 0     Fall Risk    07/23/2022   11:43 AM 07/23/2022   11:36 AM 07/19/2022    2:04 PM 07/09/2022   10:17 AM 06/11/2022   10:41 AM  Fall Risk   Falls in the past year? 0 0 0 0 0  Number falls in past yr: 0 0 0  0  Injury with Fall? 0 0 0  0  Risk for fall due to :    No Fall Risks No Fall Risks  Follow up Education provided;Falls evaluation completed   Falls evaluation completed Falls evaluation completed    FALL RISK PREVENTION PERTAINING TO THE HOME:  Any stairs in or around the home? Yes  If so, are there any without handrails? No  Home free of loose throw rugs in walkways, pet beds, electrical cords, etc? Yes  Adequate lighting in your home to reduce risk of falls? Yes   ASSISTIVE DEVICES UTILIZED TO PREVENT FALLS:  Life alert? No  Use of a cane, walker or w/c? No  Grab bars in the bathroom? Yes  Shower chair or bench in shower? No  Elevated toilet seat or a handicapped toilet? No   Cognitive Function:  Normal cognitive status assessed telephone conversation  by this Nurse Health Advisor. No abnormalities found.        07/23/2022   11:47 AM  6CIT Screen  What Year? 0 points  What month? 0 points  What time? 0 points  Count back from 20 0 points  Months in reverse 0 points  Repeat phrase 0 points  Total Score 0 points    Immunizations Immunization History  Administered Date(s) Administered   Fluad Quad(high Dose 65+) 09/10/2020   Influenza Whole 10/12/2008, 09/16/2009, 07/28/2012   Influenza, High Dose Seasonal PF 09/18/2019, 10/02/2021   Influenza,inj,Quad PF,6+  Mos 08/31/2014, 10/26/2017   Influenza-Unspecified 09/28/2013, 10/05/2015, 10/31/2018   PFIZER Comirnaty(Gray Top)Covid-19 Tri-Sucrose Vaccine 04/01/2021   PFIZER(Purple Top)SARS-COV-2 Vaccination 02/11/2020, 03/06/2020, 10/19/2020   Pfizer Covid-19 Vaccine Bivalent Booster 89yr & up  10/02/2021, 05/01/2022   Pfizer Covid-19 Vaccine Bivalent Booster 5y-11y 04/30/2022   Pneumococcal Conjugate-13 01/06/2019   Pneumococcal Polysaccharide-23 02/07/2021   Tdap 01/07/2016   Zoster Recombinat (Shingrix) 07/21/2019, 10/21/2019   Zoster, Live 11/08/2014    TDAP status: Up to date  Flu Vaccine status: Up to date  Pneumococcal vaccine status: Up to date  Covid-19 vaccine status: Completed vaccines  Qualifies for Shingles Vaccine? Yes   Zostavax completed Yes   Shingrix Completed?: Yes  Screening Tests Health Maintenance  Topic Date Due   INFLUENZA VACCINE  07/21/2022   MAMMOGRAM  09/29/2022   COLONOSCOPY (Pts 45-430yrInsurance coverage will need to be confirmed)  11/22/2023   TETANUS/TDAP  01/06/2026   Pneumonia Vaccine 6538Years old  Completed   DEXA SCAN  Completed   COVID-19 Vaccine  Completed   Hepatitis C Screening  Completed   Zoster Vaccines- Shingrix  Completed   HPV VACCINES  Aged Out    Health Maintenance  Health Maintenance Due  Topic Date Due   INFLUENZA VACCINE  07/21/2022    Colorectal cancer screening: Type of screening: Colonoscopy. Completed 11/21/2020. Repeat every 3 years  Mammogram status: Completed 09/29/2021. Repeat every year  Bone Density status: Completed 02/13/2020. Results reflect: Bone density results: OSTEOPENIA. Repeat every 5 years.  Lung Cancer Screening: (Low Dose CT Chest recommended if Age 69-80ears, 30 pack-year currently smoking OR have quit w/in 15years.) does not qualify.   Lung Cancer Screening Referral: n/a  Additional Screening:  Hepatitis C Screening: does not qualify;   Vision Screening: Recommended annual ophthalmology exams for early detection of glaucoma and other disorders of the eye. Is the patient up to date with their annual eye exam?  Yes  Who is the provider or what is the name of the office in which the patient attends annual eye exams? Dr.Shipiro  If pt is not established with a provider,  would they like to be referred to a provider to establish care? No .   Dental Screening: Recommended annual dental exams for proper oral hygiene  Community Resource Referral / Chronic Care Management: CRR required this visit?  No   CCM required this visit?  No      Plan:     I have personally reviewed and noted the following in the patient's chart:   Medical and social history Use of alcohol, tobacco or illicit drugs  Current medications and supplements including opioid prescriptions.  Functional ability and status Nutritional status Physical activity Advanced directives List of other physicians Hospitalizations, surgeries, and ER visits in previous 12 months Vitals Screenings to include cognitive, depression, and falls Referrals and appointments  In addition, I have reviewed and discussed with patient certain preventive protocols, quality metrics, and best practice recommendations. A written personalized care plan for preventive services as well as general preventive health recommendations were provided to patient.     LaDaphane ShepherdLPN   8/08/23/7341 Nurse Notes: none

## 2022-07-23 NOTE — Patient Instructions (Signed)
Dana Bryan , Thank you for taking time to come for your Medicare Wellness Visit. I appreciate your ongoing commitment to your health goals. Please review the following plan we discussed and let me know if I can assist you in the future.   Screening recommendations/referrals: Colonoscopy: 11/21/2020 Mammogram: 09/29/2021 Bone Density: 02/13/2020 Recommended yearly ophthalmology/optometry visit for glaucoma screening and checkup Recommended yearly dental visit for hygiene and checkup  Vaccinations: Influenza vaccine: completed  Pneumococcal vaccine: completed  Tdap vaccine: 01/07/2016 Shingles vaccine: completed     Advanced directives: yes   Conditions/risks identified: none   Next appointment: none    Preventive Care 68 Years and Older, Female Preventive care refers to lifestyle choices and visits with your health care provider that can promote health and wellness. What does preventive care include? A yearly physical exam. This is also called an annual well check. Dental exams once or twice a year. Routine eye exams. Ask your health care provider how often you should have your eyes checked. Personal lifestyle choices, including: Daily care of your teeth and gums. Regular physical activity. Eating a healthy diet. Avoiding tobacco and drug use. Limiting alcohol use. Practicing safe sex. Taking low-dose aspirin every day. Taking vitamin and mineral supplements as recommended by your health care provider. What happens during an annual well check? The services and screenings done by your health care provider during your annual well check will depend on your age, overall health, lifestyle risk factors, and family history of disease. Counseling  Your health care provider may ask you questions about your: Alcohol use. Tobacco use. Drug use. Emotional well-being. Home and relationship well-being. Sexual activity. Eating habits. History of falls. Memory and ability to  understand (cognition). Work and work Statistician. Reproductive health. Screening  You may have the following tests or measurements: Height, weight, and BMI. Blood pressure. Lipid and cholesterol levels. These may be checked every 5 years, or more frequently if you are over 33 years old. Skin check. Lung cancer screening. You may have this screening every year starting at age 33 if you have a 30-pack-year history of smoking and currently smoke or have quit within the past 15 years. Fecal occult blood test (FOBT) of the stool. You may have this test every year starting at age 18. Flexible sigmoidoscopy or colonoscopy. You may have a sigmoidoscopy every 5 years or a colonoscopy every 10 years starting at age 54. Hepatitis C blood test. Hepatitis B blood test. Sexually transmitted disease (STD) testing. Diabetes screening. This is done by checking your blood sugar (glucose) after you have not eaten for a while (fasting). You may have this done every 1-3 years. Bone density scan. This is done to screen for osteoporosis. You may have this done starting at age 6. Mammogram. This may be done every 1-2 years. Talk to your health care provider about how often you should have regular mammograms. Talk with your health care provider about your test results, treatment options, and if necessary, the need for more tests. Vaccines  Your health care provider may recommend certain vaccines, such as: Influenza vaccine. This is recommended every year. Tetanus, diphtheria, and acellular pertussis (Tdap, Td) vaccine. You may need a Td booster every 10 years. Zoster vaccine. You may need this after age 32. Pneumococcal 13-valent conjugate (PCV13) vaccine. One dose is recommended after age 37. Pneumococcal polysaccharide (PPSV23) vaccine. One dose is recommended after age 60. Talk to your health care provider about which screenings and vaccines you need and how often you  need them. This information is not  intended to replace advice given to you by your health care provider. Make sure you discuss any questions you have with your health care provider. Document Released: 01/03/2016 Document Revised: 08/26/2016 Document Reviewed: 10/08/2015 Elsevier Interactive Patient Education  2017 [place] Prevention in the Home Falls can cause injuries. They can happen to people of all ages. There are many things you can do to make your home safe and to help prevent falls. What can I do on the outside of my home? Regularly fix the edges of walkways and driveways and fix any cracks. Remove anything that might make you trip as you walk through a door, such as a raised step or threshold. Trim any bushes or trees on the path to your home. Use bright outdoor lighting. Clear any walking paths of anything that might make someone trip, such as rocks or tools. Regularly check to see if handrails are loose or broken. Make sure that both sides of any steps have handrails. Any raised decks and porches should have guardrails on the edges. Have any leaves, snow, or ice cleared regularly. Use sand or salt on walking paths during winter. Clean up any spills in your garage right away. This includes oil or grease spills. What can I do in the bathroom? Use night lights. Install grab bars by the toilet and in the tub and shower. Do not use towel bars as grab bars. Use non-skid mats or decals in the tub or shower. If you need to sit down in the shower, use a plastic, non-slip stool. Keep the floor dry. Clean up any water that spills on the floor as soon as it happens. Remove soap buildup in the tub or shower regularly. Attach bath mats securely with double-sided non-slip rug tape. Do not have throw rugs and other things on the floor that can make you trip. What can I do in the bedroom? Use night lights. Make sure that you have a light by your bed that is easy to reach. Do not use any sheets or blankets that are  too big for your bed. They should not hang down onto the floor. Have a firm chair that has side arms. You can use this for support while you get dressed. Do not have throw rugs and other things on the floor that can make you trip. What can I do in the kitchen? Clean up any spills right away. Avoid walking on wet floors. Keep items that you use a lot in easy-to-reach places. If you need to reach something above you, use a strong step stool that has a grab bar. Keep electrical cords out of the way. Do not use floor polish or wax that makes floors slippery. If you must use wax, use non-skid floor wax. Do not have throw rugs and other things on the floor that can make you trip. What can I do with my stairs? Do not leave any items on the stairs. Make sure that there are handrails on both sides of the stairs and use them. Fix handrails that are broken or loose. Make sure that handrails are as long as the stairways. Check any carpeting to make sure that it is firmly attached to the stairs. Fix any carpet that is loose or worn. Avoid having throw rugs at the top or bottom of the stairs. If you do have throw rugs, attach them to the floor with carpet tape. Make sure that you have a light switch  at the top of the stairs and the bottom of the stairs. If you do not have them, ask someone to add them for you. What else can I do to help prevent falls? Wear shoes that: Do not have high heels. Have rubber bottoms. Are comfortable and fit you well. Are closed at the toe. Do not wear sandals. If you use a stepladder: Make sure that it is fully opened. Do not climb a closed stepladder. Make sure that both sides of the stepladder are locked into place. Ask someone to hold it for you, if possible. Clearly mark and make sure that you can see: Any grab bars or handrails. First and last steps. Where the edge of each step is. Use tools that help you move around (mobility aids) if they are needed. These  include: Canes. Walkers. Scooters. Crutches. Turn on the lights when you go into a dark area. Replace any light bulbs as soon as they burn out. Set up your furniture so you have a clear path. Avoid moving your furniture around. If any of your floors are uneven, fix them. If there are any pets around you, be aware of where they are. Review your medicines with your doctor. Some medicines can make you feel dizzy. This can increase your chance of falling. Ask your doctor what other things that you can do to help prevent falls. This information is not intended to replace advice given to you by your health care provider. Make sure you discuss any questions you have with your health care provider. Document Released: 10/03/2009 Document Revised: 05/14/2016 Document Reviewed: 01/11/2015 Elsevier Interactive Patient Education  2017 [employer].

## 2022-07-23 NOTE — Patient Instructions (Signed)
Medication Instructions:  The current medical regimen is effective;  continue present plan and medications.  *If you need a refill on your cardiac medications before your next appointment, please call your pharmacy*   Lab Work: CBC, BMET 1 week before TEE (week of Septebmer 8th)   If you have labs (blood work) drawn today and your tests are completely normal, you will receive your results only by: Price (if you have MyChart) OR A paper copy in the mail If you have any lab test that is abnormal or we need to change your treatment, we will call you to review the results.   Testing/Procedures: Your physician has requested that you have a TEE. During a TEE, sound waves are used to create images of your heart. It provides your doctor with information about the size and shape of your heart and how well your heart's chambers and valves are working. In this test, a transducer is attached to the end of a flexible tube that's guided down your throat and into your esophagus (the tube leading from you mouth to your stomach) to get a more detailed image of your heart. You are not awake for the procedure. Please see the instruction sheet given to you today. For further information please visit [email or web address].    Follow-Up: At [facility], you and your health needs are our priority.  As part of our continuing mission to provide you with exceptional heart care, we have created designated Provider Care Teams.  These Care Teams include your primary Cardiologist (physician) and Advanced Practice Providers (APPs -  Physician Assistants and Nurse Practitioners) who all work together to provide you with the care you need, when you need it.  We recommend signing up for the patient portal called "MyChart".  Sign up information is provided on this After Visit Summary.  MyChart is used to connect with patients for Virtual Visits (Telemedicine).  Patients are able to view lab/test results, encounter  notes, upcoming appointments, etc.  Non-urgent messages can be sent to your provider as well.   To learn more about what you can do with MyChart, go to [email or web address].    Your next appointment:   12 months  The format for your next appointment:   In Person  Provider:   [name], MD    Other Instructions  You are scheduled for a TEE on September 15 th with Dr. Audie Box.  Please arrive at the [facility] (Main Entrance A) at [facility]: [place], [place] [address] at 6:30 am. (1 hour prior to procedure unless lab work is needed; if lab work is needed arrive 1.5 hours ahead)  DIET: Nothing to eat or drink after midnight except a sip of water with medications (see medication instructions below)  FYI: For your safety, and to allow [place] to monitor your vital signs accurately during the surgery/procedure we request that   if you have artificial nails, gel coating, SNS etc. Please have those removed prior to your surgery/procedure. Not having the nail coverings /polish removed may result in cancellation or delay of your surgery/procedure.   Medication Instructions:   Continue your anticoagulant: N/A You will need to continue your anticoagulant after your procedure until you  are told by your  Provider that it is safe to stop   Labs: CBC, BMET 1 week before (week of September 8th)    You must have a responsible person to drive you home and stay in the waiting area during  your procedure. Failure to do so could result in cancellation.  Bring your insurance cards.  *Special Note: Every effort is made to have your procedure done on time. Occasionally there are emergencies that occur at the hospital that may cause delays. Please be patient if a delay does occur.

## 2022-08-23 ENCOUNTER — Other Ambulatory Visit: Payer: Self-pay | Admitting: Family Medicine

## 2022-08-26 ENCOUNTER — Other Ambulatory Visit: Payer: Self-pay | Admitting: Family Medicine

## 2022-08-26 DIAGNOSIS — Z1231 Encounter for screening mammogram for malignant neoplasm of breast: Secondary | ICD-10-CM

## 2022-08-28 ENCOUNTER — Encounter (HOSPITAL_COMMUNITY): Payer: Self-pay | Admitting: Cardiovascular Disease

## 2022-08-28 DIAGNOSIS — Q231 Congenital insufficiency of aortic valve: Secondary | ICD-10-CM | POA: Diagnosis not present

## 2022-08-29 LAB — CBC
Hematocrit: 37.4 % (ref 34.0–46.6)
Hemoglobin: 12.9 g/dL (ref 11.1–15.9)
MCH: 30.2 pg (ref 26.6–33.0)
MCHC: 34.5 g/dL (ref 31.5–35.7)
MCV: 88 fL (ref 79–97)
Platelets: 233 10*3/uL (ref 150–450)
RBC: 4.27 x10E6/uL (ref 3.77–5.28)
RDW: 14.1 % (ref 11.7–15.4)
WBC: 4.8 10*3/uL (ref 3.4–10.8)

## 2022-08-29 LAB — BASIC METABOLIC PANEL
BUN/Creatinine Ratio: 16 (ref 12–28)
BUN: 12 mg/dL (ref 8–27)
CO2: 24 mmol/L (ref 20–29)
Calcium: 9.6 mg/dL (ref 8.7–10.3)
Chloride: 103 mmol/L (ref 96–106)
Creatinine, Ser: 0.74 mg/dL (ref 0.57–1.00)
Glucose: 87 mg/dL (ref 70–99)
Potassium: 4.7 mmol/L (ref 3.5–5.2)
Sodium: 140 mmol/L (ref 134–144)
eGFR: 88 mL/min/{1.73_m2} (ref >59)

## 2022-09-04 ENCOUNTER — Ambulatory Visit (HOSPITAL_COMMUNITY)
Admission: RE | Admit: 2022-09-04 | Discharge: 2022-09-04 | Disposition: A | Discharge status: Home / Self Care | Payer: Medicare HMO | Attending: Cardiovascular Disease | Admitting: Cardiovascular Disease

## 2022-09-04 ENCOUNTER — Encounter (HOSPITAL_COMMUNITY): Payer: Self-pay | Admitting: Cardiovascular Disease

## 2022-09-04 ENCOUNTER — Ambulatory Visit (HOSPITAL_BASED_OUTPATIENT_CLINIC_OR_DEPARTMENT_OTHER): Payer: Medicare HMO | Admitting: Certified Registered"

## 2022-09-04 ENCOUNTER — Ambulatory Visit (HOSPITAL_COMMUNITY): Payer: Medicare HMO | Admitting: Certified Registered"

## 2022-09-04 ENCOUNTER — Other Ambulatory Visit: Payer: Self-pay

## 2022-09-04 ENCOUNTER — Encounter (HOSPITAL_COMMUNITY)
Admission: RE | Disposition: A | Discharge status: Home / Self Care | Payer: Self-pay | Source: Home / Self Care | Attending: Cardiovascular Disease

## 2022-09-04 ENCOUNTER — Ambulatory Visit (HOSPITAL_BASED_OUTPATIENT_CLINIC_OR_DEPARTMENT_OTHER)
Admission: RE | Admit: 2022-09-04 | Discharge: 2022-09-04 | Disposition: A | Discharge status: Home / Self Care | Payer: Medicare HMO | Source: Home / Self Care | Attending: Cardiovascular Disease | Admitting: Cardiovascular Disease

## 2022-09-04 DIAGNOSIS — F418 Other specified anxiety disorders: Secondary | ICD-10-CM

## 2022-09-04 DIAGNOSIS — I352 Nonrheumatic aortic (valve) stenosis with insufficiency: Secondary | ICD-10-CM | POA: Diagnosis not present

## 2022-09-04 DIAGNOSIS — Z87891 Personal history of nicotine dependence: Secondary | ICD-10-CM | POA: Diagnosis not present

## 2022-09-04 DIAGNOSIS — I350 Nonrheumatic aortic (valve) stenosis: Secondary | ICD-10-CM

## 2022-09-04 DIAGNOSIS — I700 Atherosclerosis of aorta: Secondary | ICD-10-CM | POA: Insufficient documentation

## 2022-09-04 DIAGNOSIS — I2510 Atherosclerotic heart disease of native coronary artery without angina pectoris: Secondary | ICD-10-CM | POA: Diagnosis not present

## 2022-09-04 DIAGNOSIS — R69 Illness, unspecified: Secondary | ICD-10-CM | POA: Diagnosis not present

## 2022-09-04 HISTORY — PX: BUBBLE STUDY: SHX6837

## 2022-09-04 HISTORY — PX: TEE WITHOUT CARDIOVERSION: SHX5443

## 2022-09-04 LAB — ECHO TEE
AR max vel: 1.2 cm2
AV Area VTI: 1.15 cm2
AV Area mean vel: 1.28 cm2
AV Mean grad: 21 mmHg
AV Peak grad: 32.3 mmHg
Ao pk vel: 2.84 m/s

## 2022-09-04 SURGERY — ECHOCARDIOGRAM, TRANSESOPHAGEAL
Anesthesia: Monitor Anesthesia Care

## 2022-09-04 MED ORDER — SODIUM CHLORIDE 0.9 % IV SOLN: INTRAVENOUS | Status: DC

## 2022-09-04 MED ORDER — PROPOFOL 500 MG/50ML IV EMUL
INTRAVENOUS | Status: DC | PRN
  Administered 2022-09-04: 75 ug/kg/min via INTRAVENOUS

## 2022-09-04 MED ORDER — BUTAMBEN-TETRACAINE-BENZOCAINE 2-2-14 % EX AERO
INHALATION_SPRAY | CUTANEOUS | Status: DC | PRN
  Administered 2022-09-04: 2 via TOPICAL

## 2022-09-04 MED ORDER — LIDOCAINE 2% (20 MG/ML) 5 ML SYRINGE
INTRAMUSCULAR | Status: DC | PRN
  Administered 2022-09-04 (×2): 20 mg via INTRAVENOUS

## 2022-09-04 NOTE — CV Procedure (Signed)
    TRANSESOPHAGEAL ECHOCARDIOGRAM   NAME:  Dana Bryan    MRN: [id number] DOB:  [birth year]    ADMIT DATE: 09/04/2022  INDICATIONS: Bicuspid aortic valve  PROCEDURE:   Informed consent was obtained prior to the procedure. The risks, benefits and alternatives for the procedure were discussed and the patient comprehended these risks.  Risks include, but are not limited to, cough, sore throat, vomiting, nausea, somnolence, esophageal and stomach trauma or perforation, bleeding, low blood pressure, aspiration, pneumonia, infection, trauma to the teeth and death.    Procedural time out performed. The oropharynx was anesthetized with topical 1% benzocaine.    Anesthesia was administered by Dr. Roanna Banning.  The patient was administered 130 mg of propofol and 40 mg of lidocaine to achieve and maintain moderate conscious sedation.  The patient's heart rate, blood pressure, and oxygen saturation are monitored continuously during the procedure. The period of conscious sedation is 15 minutes, of which I was present face-to-face 100% of this time.   The transesophageal probe was inserted in the esophagus and stomach without difficulty and multiple views were obtained.   COMPLICATIONS:    There were no immediate complications.  KEY FINDINGS:  Bicuspid aortic valve. Mild to moderate aortic stenosis. No left atrial appendage thrombus Normal left ventricular and right ventricular function.  Full report to follow. Further management per primary team.   [place] T. Audie Box, MD, [place]  [address] [place], [place] [address] [phone]  7:56 AM

## 2022-09-04 NOTE — H&P (Signed)
Cardiology Admission History and Physical:  Patient ID: Dana Bryan MRN: [id number] DOB: 1951-04-23  Admit date: 09/04/2022  Primary Care Provider: Midge Minium, MD Primary Cardiologist: [name], MD  Primary Electrophysiologist:  None   Chief Complaint:  Bicuspid aortic valve  Patient Profile:  Dana Bryan is a 71 y.o. female with bicuspid aortic valve who presents for TEE.   History of Present Illness:  Ms. Montesano underwent cardiac CT for chest pain.  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  Bicuspid aortic valve.  Echocardiogram with uncertainty and severity of stenosis.  Examination and consistent with severe aortic stenosis.  Also concerns for left atrial appendage thrombus but no history of atrial fibrillation seen on cardiac CT.  She presents for transesophageal echocardiogram for further evaluation.  She reports no difficulty swallowing.  She denies any chest pain or trouble breathing.  She is quite stable today on examination.  Heart Pathway Score:       Past Medical History: Past Medical History:  Diagnosis Date   Anxiety    on meds   Basal cell carcinoma    Cancer ([place])    Cataract    no sx    Generalized headaches    Heart murmur    per Dr.Tabori   Hyperlipidemia    on meds   Reactive depression (situational)    on meds   Squamous cell skin cancer, finger    Vasomotor rhinitis     Past Surgical History: Past Surgical History:  Procedure Laterality Date   COLONOSCOPY  2008   normal    TONSILLECTOMY  1973     Medications Prior to Admission: Prior to Admission medications   Medication Sig Start Date End Date Taking? Authorizing Provider  ALPRAZolam (XANAX) 0.25 MG tablet Take 1 tablet (0.25 mg total) by mouth daily as needed. 06/11/22  Yes Midge Minium, MD  Ascorbic Acid (VITAMIN C PO) Take 500 mg by mouth daily.   Yes [provider]  atorvastatin (LIPITOR) 20 MG tablet TAKE 1 TABLET BY MOUTH EVERY DAY 08/25/22   Yes Midge Minium, MD  buPROPion (WELLBUTRIN XL) 300 MG 24 hr tablet Take 1 tablet (300 mg total) by mouth daily. 06/01/22  Yes Midge Minium, MD  busPIRone (BUSPAR) 7.5 MG tablet TAKE 1 TABLET BY MOUTH TWICE A DAY 07/10/22  Yes Midge Minium, MD  Cholecalciferol (VITAMIN D3 PO) Take 2,000 Units by mouth.   Yes [provider]  clobetasol cream (TEMOVATE) 6.21 % Apply 1 application  topically 2 (two) times daily as needed (irritation).   Yes [provider]  ibuprofen (ADVIL,MOTRIN) 200 MG tablet Take 400 mg by mouth daily as needed for headache.   Yes [provider]  zinc gluconate 50 MG tablet Take 50 mg by mouth daily.   Yes [provider]     Allergies:    Allergies  Allergen Reactions   Chlorhexidine Gluconate Rash    Social History:   Social History   Socioeconomic History   Marital status: Married    Spouse name: Not on file   Number of children: 2   Years of education: Not on file   Highest education level: Not on file  Occupational History   Occupation: Retired Science writer  Tobacco Use   Smoking status: Former    Years: 30.00    Types: Cigarettes    Quit date: 01/22/2004    Years since quitting: 18.6   Smokeless tobacco: Never  Vaping Use  Vaping Use: Never used  Substance and Sexual Activity   Alcohol use: Yes    Alcohol/week: 15.0 standard drinks of alcohol    Types: 15 Standard drinks or equivalent per week    Comment: drinks 1-2 drinks daily   Drug use: Never   Sexual activity: Not on file  Other Topics Concern   Not on file  Social History Narrative   HSG, college Grad. Married '71. 2 Daughters- '77, '81. 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, occupational)   Social Determinants of [place] Strain: [place]  (07/23/2022)   Overall Financial Resource Strain (CARDIA)    Difficulty of Paying Living Expenses: Not hard at all   Food Insecurity: No [place] (07/23/2022)   Hunger Vital Sign    Worried About Running Out of Food in the Last Year: Never true    [place] in the Last Year: Never true  Transportation Needs: No Transportation Needs (07/23/2022)   PRAPARE - Hydrologist (Medical): No    Lack of Transportation (Non-Medical): No  Physical Activity: Insufficiently Active (07/23/2022)   Exercise Vital Sign    Days of Exercise per Week: 3 days    Minutes of Exercise per Session: 30 min  Stress: No Stress Concern Present (07/23/2022)   [place]    Feeling of Stress : Not at all  Social Connections: Moderately Isolated (07/23/2022)   Social Connection and Isolation Panel [NHANES]    Frequency of Communication with Friends and Family: Three times a week    Frequency of Social Gatherings with Friends and Family: Three times a week    Attends Religious Services: Never    Active Member of Clubs or Organizations: No    Attends Archivist Meetings: Never    Marital Status: Married  Human resources officer Violence: Not At Risk (07/23/2022)   Humiliation, Afraid, Rape, and Kick questionnaire    Fear of Current or Ex-Partner: No    Emotionally Abused: No    Physically Abused: No    Sexually Abused: No     Family History:   The patient's family history includes Alcohol abuse in her father; Heart attack in her mother; Hypertension in her father and mother. There is no history of Colon polyps, Colon cancer, Esophageal cancer, Stomach cancer, or Rectal cancer.    ROS:  All other ROS reviewed and negative. Pertinent positives noted in the HPI.     Physical Exam/Data:   Vitals:   09/04/22 0648  BP: (!) 145/74  Pulse: 64  Resp: 20  Temp: (!) 97.1 F (36.2 C)  TempSrc: Temporal  SpO2: 98%  Weight: 59.9 kg  Height: '5\' 4"'$  (1.626 m)   No intake or output data in the 24 hours ending 09/04/22 0723      09/04/2022    6:48 AM 07/23/2022    9:16 AM 07/09/2022   10:17 AM  Last 3 Weights  Weight (lbs) 132 lb 133 lb 132 lb 8 oz  Weight (kg) 59.875 kg 60.328 kg 60.102 kg    Body mass index is 22.66 kg/m.  General: Well nourished, well developed, in no acute distress Head: Atraumatic, normal size  Eyes: PEERLA, EOMI  Neck: Supple, no JVD Endocrine: No thryomegaly Cardiac: Normal S1, S2; RRR; no murmurs, rubs, or gallops Lungs: Clear to auscultation bilaterally, no wheezing,  rhonchi or rales  Abd: Soft, nontender, no hepatomegaly  Ext: No edema, pulses 2+ Musculoskeletal: No deformities, BUE and BLE strength normal and equal Skin: Warm and dry, no rashes   Neuro: Alert and oriented to person, place, time, and situation, CNII-XII grossly intact, no focal deficits  Psych: Normal mood and affect    Laboratory Data: High Sensitivity Troponin:  No results for input(s): "TROPONINIHS" in the last 720 hours.    Cardiac EnzymesNo results for input(s): "TROPONINI" in the last 168 hours. No results for input(s): "TROPIPOC" in the last 168 hours.  Chemistry Recent Labs  Lab 08/28/22 0946  NA 140  K 4.7  CL 103  CO2 24  GLUCOSE 87  BUN 12  CREATININE 0.74  CALCIUM 9.6    No results for input(s): "PROT", "ALBUMIN", "AST", "ALT", "ALKPHOS", "BILITOT" in the last 168 hours. Hematology Recent Labs  Lab 08/28/22 0946  WBC 4.8  RBC 4.27  HGB 12.9  HCT 37.4  MCV 88  MCH 30.2  MCHC 34.5  RDW 14.1  PLT 233   BNPNo results for input(s): "BNP", "PROBNP" in the last 168 hours.  DDimer No results for input(s): "DDIMER" in the last 168 hours.  Radiology/Studies:  No results found.  Assessment and Plan:   #Bicuspid aortic valve #Possible left atrial appendage thrombus -N.p.o. for transesophageal echocardiogram to evaluate bicuspid aortic valve as well as possible left atrial appendage thrombus.  No difficulty swallowing.  Allergies reviewed.  We will proceed with transesophageal  echocardiogram.  Signed, Addison Naegeli. Audie Box, MD, [place]  09/04/2022 7:23 AM

## 2022-09-04 NOTE — Anesthesia Preprocedure Evaluation (Addendum)
Anesthesia Evaluation  Patient identified by MRN, date of birth, ID band Patient awake    Reviewed: Allergy & Precautions, NPO status , Patient's Chart, lab work & pertinent test results  Airway Mallampati: II  TM Distance: >3 FB Neck ROM: Full    Dental no notable dental hx.    Pulmonary former smoker,    Pulmonary exam normal        Cardiovascular + Valvular Problems/Murmurs AS  Rhythm:Regular Rate:Normal + Systolic murmurs    Neuro/Psych PSYCHIATRIC DISORDERS Anxiety Depression negative neurological ROS     GI/Hepatic negative GI ROS, (+)     substance abuse  ,   Endo/Other  negative endocrine ROS  Renal/GU negative Renal ROS     Musculoskeletal negative musculoskeletal ROS (+)   Abdominal   Peds  Hematology negative hematology ROS (+)   Anesthesia Other Findings AORTIC STENOSIS  Reproductive/Obstetrics                            Anesthesia Physical Anesthesia Plan  ASA: 4  Anesthesia Plan: MAC   Post-op Pain Management:    Induction: Intravenous  PONV Risk Score and Plan: 2 and Propofol infusion and Treatment may vary due to age or medical condition  Airway Management Planned: Nasal Cannula  Additional Equipment:   Intra-op Plan:   Post-operative Plan:   Informed Consent: I have reviewed the patients History and Physical, chart, labs and discussed the procedure including the risks, benefits and alternatives for the proposed anesthesia with the patient or authorized representative who has indicated his/her understanding and acceptance.     Dental advisory given  Plan Discussed with: CRNA  Anesthesia Plan Comments:         Anesthesia Quick Evaluation

## 2022-09-04 NOTE — Anesthesia Procedure Notes (Signed)
Procedure Name: MAC Date/Time: 09/04/2022 7:36 AM  Performed by: Lavell Luster, CRNAPre-anesthesia Checklist: Patient identified, Emergency Drugs available, Suction available, Patient being monitored and Timeout performed Patient Re-evaluated:Patient Re-evaluated prior to induction Oxygen Delivery Method: Circle system utilized Preoxygenation: Pre-oxygenation with 100% oxygen Induction Type: IV induction Placement Confirmation: positive ETCO2 Dental Injury: Teeth and Oropharynx as per pre-operative assessment

## 2022-09-04 NOTE — Progress Notes (Signed)
  Echocardiogram Echocardiogram Transesophageal has been performed.  Dana Bryan 09/04/2022, 8:27 AM

## 2022-09-04 NOTE — Transfer of Care (Signed)
Immediate Anesthesia Transfer of Care Note  Patient: Dana Bryan  Procedure(s) Performed: TRANSESOPHAGEAL ECHOCARDIOGRAM (TEE) BUBBLE STUDY  Patient Location: PACU  Anesthesia Type:MAC  Level of Consciousness: sedated  Airway & Oxygen Therapy: Patient connected to nasal cannula oxygen  Post-op Assessment: Post -op Vital signs reviewed and stable  Post vital signs: stable  Last Vitals:  Vitals Value Taken Time  BP 122/65 09/04/22 0801  Temp    Pulse 60 09/04/22 0803  Resp 14 09/04/22 0803  SpO2 100 % 09/04/22 0803  Vitals shown include unvalidated device data.  Last Pain:  Vitals:   09/04/22 0648  TempSrc: Temporal  PainSc: 1          Complications: No notable events documented.

## 2022-09-06 ENCOUNTER — Encounter (HOSPITAL_COMMUNITY): Payer: Self-pay | Admitting: Cardiovascular Disease

## 2022-09-06 NOTE — Anesthesia Postprocedure Evaluation (Signed)
Anesthesia Post Note  Patient: Dana Bryan  Procedure(s) Performed: TRANSESOPHAGEAL ECHOCARDIOGRAM (TEE) BUBBLE STUDY     Patient location during evaluation: Endoscopy Anesthesia Type: MAC Level of consciousness: awake Pain management: pain level controlled Vital Signs Assessment: post-procedure vital signs reviewed and stable Respiratory status: spontaneous breathing, nonlabored ventilation, respiratory function stable and patient connected to nasal cannula oxygen Cardiovascular status: stable and blood pressure returned to baseline Postop Assessment: no apparent nausea or vomiting Anesthetic complications: no   No notable events documented.  Last Vitals:  Vitals:   09/04/22 0815 09/04/22 0823  BP: 132/65 138/72  Pulse: 67 60  Resp: 15 16  Temp:  36.5 C  SpO2: 99% 98%    Last Pain:  Vitals:   09/04/22 0823  TempSrc:   PainSc: 0-No pain                 [name]

## 2022-09-14 ENCOUNTER — Encounter: Payer: Medicare HMO | Admitting: Family Medicine

## 2022-09-21 ENCOUNTER — Ambulatory Visit (INDEPENDENT_AMBULATORY_CARE_PROVIDER_SITE_OTHER): Payer: Medicare HMO | Admitting: Family Medicine

## 2022-09-21 ENCOUNTER — Encounter: Payer: Self-pay | Admitting: Family Medicine

## 2022-09-21 VITALS — BP 124/70 | HR 72 | Temp 96.6°F | Resp 16 | Ht 63.0 in | Wt 134.1 lb

## 2022-09-21 DIAGNOSIS — Z23 Encounter for immunization: Secondary | ICD-10-CM | POA: Diagnosis not present

## 2022-09-21 DIAGNOSIS — E785 Hyperlipidemia, unspecified: Secondary | ICD-10-CM | POA: Diagnosis not present

## 2022-09-21 DIAGNOSIS — E538 Deficiency of other specified B group vitamins: Secondary | ICD-10-CM

## 2022-09-21 DIAGNOSIS — Z0000 Encounter for general adult medical examination without abnormal findings: Secondary | ICD-10-CM | POA: Diagnosis not present

## 2022-09-21 DIAGNOSIS — M8580 Other specified disorders of bone density and structure, unspecified site: Secondary | ICD-10-CM

## 2022-09-21 LAB — LIPID PANEL
Cholesterol: 161 mg/dL (ref 0–200)
HDL: 86.9 mg/dL (ref >39.00)
LDL Cholesterol: 65 mg/dL (ref 0–99)
NonHDL: 74.07
Total CHOL/HDL Ratio: 2
Triglycerides: 45 mg/dL (ref 0.0–149.0)
VLDL: 9 mg/dL (ref 0.0–40.0)

## 2022-09-21 LAB — VITAMIN B12: Vitamin B-12: 146 pg/mL — ABNORMAL LOW (ref 211–911)

## 2022-09-21 LAB — HEPATIC FUNCTION PANEL
ALT: 15 U/L (ref 0–35)
AST: 14 U/L (ref 0–37)
Albumin: 4.3 g/dL (ref 3.5–5.2)
Alkaline Phosphatase: 107 U/L (ref 39–117)
Bilirubin, Direct: 0.1 mg/dL (ref 0.0–0.3)
Total Bilirubin: 0.4 mg/dL (ref 0.2–1.2)
Total Protein: 7.2 g/dL (ref 6.0–8.3)

## 2022-09-21 LAB — TSH: TSH: 3.94 u[IU]/mL (ref 0.35–5.50)

## 2022-09-21 LAB — VITAMIN D 25 HYDROXY (VIT D DEFICIENCY, FRACTURES): VITD: 40.98 ng/mL (ref 30.00–100.00)

## 2022-09-21 NOTE — Progress Notes (Signed)
   Subjective:    Patient ID: Dana Bryan, female    DOB: 1951-01-26, 71 y.o.   MRN: [id number]  HPI CPE- UTD on colonoscopy, mammo, Tdap, PNA vaccines.  Will get flu today  Patient Care Team    Relationship Specialty Notifications Start End  Midge Minium, MD PCP - General Family Medicine  01/06/19   O'Neal, Cassie Freer, MD PCP - Cardiology Cardiology  06/19/22   Paula Compton, MD Consulting Physician Obstetrics and Gynecology  01/06/19   Armbruster, Carlota Raspberry, MD Consulting Physician Gastroenterology  01/23/20   Madelin Rear, [facility] (Inactive) Pharmacist Pharmacist  08/04/21    Comment: [phone]    Health Maintenance  Topic Date Due   INFLUENZA VACCINE  07/21/2022   MAMMOGRAM  09/29/2022   COLONOSCOPY (Pts 45-76yr Insurance coverage will need to be confirmed)  11/22/2023   TETANUS/TDAP  01/06/2026   Pneumonia Vaccine 71 Years old  Completed   DEXA SCAN  Completed   COVID-19 Vaccine  Completed   Hepatitis C Screening  Completed   Zoster Vaccines- Shingrix  Completed   HPV VACCINES  Aged Out      Review of Systems Patient reports no vision/ hearing changes, adenopathy,fever, weight change,  persistant/recurrent hoarseness , swallowing issues, chest pain, palpitations, edema, persistant/recurrent cough, hemoptysis, dyspnea (rest/exertional/paroxysmal nocturnal), gastrointestinal bleeding (melena, rectal bleeding), abdominal pain, significant heartburn, bowel changes, GU symptoms (dysuria, hematuria, incontinence), Gyn symptoms (abnormal  bleeding, pain),  syncope, focal weakness, memory loss, numbness & tingling, skin/hair/nail changes, abnormal bruising or bleeding, anxiety, or depression.     Objective:   Physical Exam General Appearance:    Alert, cooperative, no distress, appears stated age  Head:    Normocephalic, without obvious abnormality, atraumatic  Eyes:    PERRL, conjunctiva/corneas clear, EOM's intact both eyes  Ears:    Normal TM's and external  ear canals, both ears  Nose:   Nares normal, septum midline, mucosa normal, no drainage    or sinus tenderness  Throat:   Lips, mucosa, and tongue normal; teeth and gums normal  Neck:   Supple, symmetrical, trachea midline, no adenopathy;    Thyroid: no enlargement/tenderness/nodules  Back:     Symmetric, no curvature, ROM normal, no CVA tenderness  Lungs:     Clear to auscultation bilaterally, respirations unlabored  Chest Wall:    No tenderness or deformity   Heart:    Regular rate and rhythm, S1 and S2 normal, no murmur, rub   or gallop  Breast Exam:    Deferred to GYN  Abdomen:     Soft, non-tender, bowel sounds active all four quadrants,    no masses, no organomegaly  Genitalia:    Deferred to GYN  Rectal:    Extremities:   Extremities normal, atraumatic, no cyanosis or edema  Pulses:   2+ and symmetric all extremities  Skin:   Skin color, texture, turgor normal, no rashes or lesions  Lymph nodes:   Cervical, supraclavicular, and axillary nodes normal  Neurologic:   CNII-XII intact, normal strength, sensation and reflexes    throughout          Assessment & Plan:

## 2022-09-21 NOTE — Assessment & Plan Note (Signed)
Pt's PE WNL.  UTD on colonoscopy, mammo, Tdap, PNA.  Flu shot given today.  Check labs.  Anticipatory guidance provided.

## 2022-09-21 NOTE — Assessment & Plan Note (Signed)
Check labs and replete prn. 

## 2022-09-21 NOTE — Patient Instructions (Addendum)
Follow up in 6 months to recheck cholesterol We'll notify you of your lab results and make any changes if needed Keep up the good work on healthy diet and regular exercise- you look great! Call with any questions or concerns Stay Safe!  Stay Healthy! Happy Fall!!! 

## 2022-09-21 NOTE — Assessment & Plan Note (Signed)
Chronic problem.  Due for DEXA- ordered.  Check Vit D and replete prn.

## 2022-09-21 NOTE — Assessment & Plan Note (Signed)
Chronic problem.  Tolerating statin w/o difficulty.  Check labs.  Adjust meds prn  

## 2022-09-22 NOTE — Progress Notes (Signed)
Pt seen results Via my chart  

## 2022-09-30 ENCOUNTER — Ambulatory Visit
Admission: RE | Admit: 2022-09-30 | Discharge: 2022-09-30 | Disposition: A | Discharge status: Home / Self Care | Payer: Medicare HMO | Source: Ambulatory Visit | Attending: Family Medicine | Admitting: Family Medicine

## 2022-09-30 DIAGNOSIS — Z1231 Encounter for screening mammogram for malignant neoplasm of breast: Secondary | ICD-10-CM

## 2022-10-08 ENCOUNTER — Other Ambulatory Visit: Payer: Self-pay | Admitting: Family Medicine

## 2022-10-20 ENCOUNTER — Ambulatory Visit (INDEPENDENT_AMBULATORY_CARE_PROVIDER_SITE_OTHER)
Admission: RE | Admit: 2022-10-20 | Discharge: 2022-10-20 | Disposition: A | Discharge status: Home / Self Care | Payer: Medicare HMO | Source: Ambulatory Visit | Attending: Family Medicine | Admitting: Family Medicine

## 2022-10-20 DIAGNOSIS — M8580 Other specified disorders of bone density and structure, unspecified site: Secondary | ICD-10-CM

## 2022-11-06 ENCOUNTER — Ambulatory Visit (INDEPENDENT_AMBULATORY_CARE_PROVIDER_SITE_OTHER)
Admission: RE | Admit: 2022-11-06 | Discharge: 2022-11-06 | Disposition: A | Discharge status: Home / Self Care | Payer: Medicare HMO | Source: Ambulatory Visit | Attending: Family Medicine | Admitting: Family Medicine

## 2022-11-06 ENCOUNTER — Encounter: Payer: Self-pay | Admitting: Family Medicine

## 2022-11-06 ENCOUNTER — Ambulatory Visit (INDEPENDENT_AMBULATORY_CARE_PROVIDER_SITE_OTHER): Payer: Medicare HMO | Admitting: Family Medicine

## 2022-11-06 ENCOUNTER — Telehealth: Payer: Self-pay | Admitting: Family Medicine

## 2022-11-06 VITALS — BP 112/68 | HR 92 | Temp 97.9°F | Resp 18 | Ht 63.0 in | Wt 131.4 lb

## 2022-11-06 DIAGNOSIS — R051 Acute cough: Secondary | ICD-10-CM | POA: Diagnosis not present

## 2022-11-06 DIAGNOSIS — R059 Cough, unspecified: Secondary | ICD-10-CM | POA: Diagnosis not present

## 2022-11-06 DIAGNOSIS — R52 Pain, unspecified: Secondary | ICD-10-CM | POA: Diagnosis not present

## 2022-11-06 DIAGNOSIS — J189 Pneumonia, unspecified organism: Secondary | ICD-10-CM | POA: Diagnosis not present

## 2022-11-06 DIAGNOSIS — R079 Chest pain, unspecified: Secondary | ICD-10-CM | POA: Diagnosis not present

## 2022-11-06 LAB — POC COVID19 BINAXNOW: SARS Coronavirus 2 Ag: NEGATIVE

## 2022-11-06 LAB — POCT INFLUENZA A/B
Influenza A, POC: NEGATIVE
Influenza B, POC: NEGATIVE

## 2022-11-06 MED ORDER — GUAIFENESIN-CODEINE 100-10 MG/5ML PO SYRP: 10.0000 mL | ORAL_SOLUTION | Freq: Three times a day (TID) | ORAL | 0 refills | Status: DC | PRN

## 2022-11-06 MED ORDER — ALBUTEROL SULFATE HFA 108 (90 BASE) MCG/ACT IN AERS: 2.0000 | INHALATION_SPRAY | Freq: Four times a day (QID) | RESPIRATORY_TRACT | 0 refills | Status: DC | PRN

## 2022-11-06 MED ORDER — PROMETHAZINE-DM 6.25-15 MG/5ML PO SYRP: 5.0000 mL | ORAL_SOLUTION | Freq: Four times a day (QID) | ORAL | 0 refills | Status: DC | PRN

## 2022-11-06 NOTE — Telephone Encounter (Signed)
I switched the codeine based cough syrup to the promethazine cough syrup

## 2022-11-06 NOTE — Progress Notes (Signed)
   Subjective:    Patient ID: Dana Bryan, female    DOB: Dec 28, 1950, 71 y.o.   MRN: [id number]  HPI 'i feel terrible'- sxs started 11 days ago w/ cough.  + body aches, 'even my skin hurts'.  Cough is productive, having trouble sleeping.  Was in bed 'all day yesterday'.  No fevers.  + HA.  Denies sinus pain/pressure.  'pain in L lung when I breathe deep'.     Review of Systems For ROS see HPI     Objective:   Physical Exam Vitals reviewed.  Constitutional:      General: She is not in acute distress.    Appearance: She is well-developed. She is ill-appearing.     Comments: Obviously not feeling well  HENT:     Head: Normocephalic and atraumatic.     Right Ear: Tympanic membrane normal.     Left Ear: Tympanic membrane normal.     Nose: Mucosal edema and congestion present. No rhinorrhea.     Right Sinus: No maxillary sinus tenderness or frontal sinus tenderness.     Left Sinus: No maxillary sinus tenderness or frontal sinus tenderness.     Mouth/Throat:     Pharynx: Uvula midline. Posterior oropharyngeal erythema present. No oropharyngeal exudate.  Eyes:     Conjunctiva/sclera: Conjunctivae normal.     Pupils: Pupils are equal, round, and reactive to light.  Cardiovascular:     Rate and Rhythm: Normal rate and regular rhythm.     Heart sounds: Normal heart sounds.  Pulmonary:     Effort: Pulmonary effort is normal. No respiratory distress.     Breath sounds: No wheezing.     Comments: + wet, hacking cough + decreased breath sounds at lung bases Musculoskeletal:     Cervical back: Normal range of motion and neck supple.  Lymphadenopathy:     Cervical: No cervical adenopathy.  Skin:    General: Skin is warm.  Neurological:     Mental Status: She is alert.           Assessment & Plan:  Cough/body aches- ongoing issue for pt.  Flu and COVID tests negative.  Given her worsening symptoms and now pain in L lung w/ deep breath, concern for PNA.  Will get CXR,  start Doxycycline as pt has hx of tobacco use, albuterol as needed for shortness of breath/wheezing, and cough syrup prn.  Pt appears unwell and cautioned that she may need to go to ER if sxs change or worsen.  Pt expressed understanding and is in agreement w/ plan.

## 2022-11-06 NOTE — Patient Instructions (Signed)
Go to [facility] to get your chest xray (in the basement) USE the Albuterol- 2 puffs as needed- for cough, wheezing, shortness of breath TAKE the codeine cough syrup as needed- may cause drowsiness When NOT taking the codeine syrup use either Mucinex DM, Robitussin, or Delsym Alternate Tylenol/ibuprofen as needed for body aches Drink LOTS of fluids LOTS of rest Hang in there!!!

## 2022-11-06 NOTE — Telephone Encounter (Signed)
Caller name: Jonquil Stubbe  On DPR?: Yes  Call back number: [phone] (mobile)  Provider they see: Midge Minium, MD  Reason for call:  pt called stating that pharmacy told her this medication guaifenesin-codeine isn't cover by her insurance and her pharmacy is out of stock. Pt wants to know is there another medication she can take.

## 2022-11-06 NOTE — Telephone Encounter (Signed)
Called and informed patient. 

## 2022-11-06 NOTE — Telephone Encounter (Signed)
Pharmacy is out of the cough medicine and insurance did not cover it . Is there another one we can call in ?

## 2022-11-09 ENCOUNTER — Other Ambulatory Visit: Payer: Self-pay

## 2022-11-09 ENCOUNTER — Emergency Department (HOSPITAL_COMMUNITY): Payer: Medicare HMO

## 2022-11-09 ENCOUNTER — Encounter (HOSPITAL_COMMUNITY): Payer: Self-pay | Admitting: Emergency Medicine

## 2022-11-09 ENCOUNTER — Telehealth: Payer: Self-pay

## 2022-11-09 ENCOUNTER — Inpatient Hospital Stay (HOSPITAL_COMMUNITY)
Admission: EM | Admit: 2022-11-09 | Discharge: 2022-11-18 | DRG: 193 | Disposition: A | Discharge status: Home / Self Care | Payer: Medicare HMO | Attending: Family Medicine | Admitting: Family Medicine

## 2022-11-09 DIAGNOSIS — J90 Pleural effusion, not elsewhere classified: Secondary | ICD-10-CM | POA: Diagnosis not present

## 2022-11-09 DIAGNOSIS — F419 Anxiety disorder, unspecified: Secondary | ICD-10-CM | POA: Diagnosis present

## 2022-11-09 DIAGNOSIS — I350 Nonrheumatic aortic (valve) stenosis: Secondary | ICD-10-CM

## 2022-11-09 DIAGNOSIS — J168 Pneumonia due to other specified infectious organisms: Secondary | ICD-10-CM | POA: Diagnosis not present

## 2022-11-09 DIAGNOSIS — E785 Hyperlipidemia, unspecified: Secondary | ICD-10-CM | POA: Diagnosis present

## 2022-11-09 DIAGNOSIS — R0781 Pleurodynia: Secondary | ICD-10-CM | POA: Diagnosis present

## 2022-11-09 DIAGNOSIS — E871 Hypo-osmolality and hyponatremia: Secondary | ICD-10-CM | POA: Diagnosis present

## 2022-11-09 DIAGNOSIS — J869 Pyothorax without fistula: Secondary | ICD-10-CM

## 2022-11-09 DIAGNOSIS — J189 Pneumonia, unspecified organism: Principal | ICD-10-CM | POA: Diagnosis present

## 2022-11-09 DIAGNOSIS — R079 Chest pain, unspecified: Secondary | ICD-10-CM | POA: Diagnosis not present

## 2022-11-09 DIAGNOSIS — Z85828 Personal history of other malignant neoplasm of skin: Secondary | ICD-10-CM

## 2022-11-09 DIAGNOSIS — I10 Essential (primary) hypertension: Secondary | ICD-10-CM | POA: Diagnosis present

## 2022-11-09 DIAGNOSIS — Q231 Congenital insufficiency of aortic valve: Secondary | ICD-10-CM

## 2022-11-09 DIAGNOSIS — F32A Depression, unspecified: Secondary | ICD-10-CM | POA: Diagnosis present

## 2022-11-09 DIAGNOSIS — E878 Other disorders of electrolyte and fluid balance, not elsewhere classified: Secondary | ICD-10-CM | POA: Diagnosis present

## 2022-11-09 DIAGNOSIS — D72829 Elevated white blood cell count, unspecified: Secondary | ICD-10-CM | POA: Insufficient documentation

## 2022-11-09 DIAGNOSIS — I700 Atherosclerosis of aorta: Secondary | ICD-10-CM | POA: Diagnosis present

## 2022-11-09 DIAGNOSIS — R0602 Shortness of breath: Secondary | ICD-10-CM | POA: Diagnosis not present

## 2022-11-09 DIAGNOSIS — F1010 Alcohol abuse, uncomplicated: Secondary | ICD-10-CM | POA: Diagnosis present

## 2022-11-09 DIAGNOSIS — N393 Stress incontinence (female) (male): Secondary | ICD-10-CM | POA: Diagnosis present

## 2022-11-09 DIAGNOSIS — K219 Gastro-esophageal reflux disease without esophagitis: Secondary | ICD-10-CM | POA: Diagnosis present

## 2022-11-09 DIAGNOSIS — J449 Chronic obstructive pulmonary disease, unspecified: Secondary | ICD-10-CM | POA: Diagnosis present

## 2022-11-09 DIAGNOSIS — J9811 Atelectasis: Secondary | ICD-10-CM | POA: Diagnosis present

## 2022-11-09 DIAGNOSIS — R0789 Other chest pain: Secondary | ICD-10-CM | POA: Diagnosis not present

## 2022-11-09 DIAGNOSIS — Z811 Family history of alcohol abuse and dependence: Secondary | ICD-10-CM

## 2022-11-09 DIAGNOSIS — F3289 Other specified depressive episodes: Secondary | ICD-10-CM | POA: Diagnosis present

## 2022-11-09 DIAGNOSIS — I447 Left bundle-branch block, unspecified: Secondary | ICD-10-CM | POA: Diagnosis present

## 2022-11-09 DIAGNOSIS — E876 Hypokalemia: Secondary | ICD-10-CM | POA: Diagnosis present

## 2022-11-09 DIAGNOSIS — Z79899 Other long term (current) drug therapy: Secondary | ICD-10-CM

## 2022-11-09 DIAGNOSIS — Z8249 Family history of ischemic heart disease and other diseases of the circulatory system: Secondary | ICD-10-CM

## 2022-11-09 DIAGNOSIS — Z87891 Personal history of nicotine dependence: Secondary | ICD-10-CM

## 2022-11-09 LAB — CBC WITH DIFFERENTIAL/PLATELET
Abs Immature Granulocytes: 0.26 10*3/uL — ABNORMAL HIGH (ref 0.00–0.07)
Basophils Absolute: 0 10*3/uL (ref 0.0–0.1)
Basophils Relative: 0 %
Eosinophils Absolute: 0 10*3/uL (ref 0.0–0.5)
Eosinophils Relative: 0 %
HCT: 36.2 % (ref 36.0–46.0)
Hemoglobin: 12.2 g/dL (ref 12.0–15.0)
Immature Granulocytes: 2 %
Lymphocytes Relative: 5 %
Lymphs Abs: 0.8 10*3/uL (ref 0.7–4.0)
MCH: 30.7 pg (ref 26.0–34.0)
MCHC: 33.7 g/dL (ref 30.0–36.0)
MCV: 91 fL (ref 80.0–100.0)
Monocytes Absolute: 1.1 10*3/uL — ABNORMAL HIGH (ref 0.1–1.0)
Monocytes Relative: 7 %
Neutro Abs: 14.4 10*3/uL — ABNORMAL HIGH (ref 1.7–7.7)
Neutrophils Relative %: 86 %
Platelets: 274 10*3/uL (ref 150–400)
RBC: 3.98 MIL/uL (ref 3.87–5.11)
RDW: 13.7 % (ref 11.5–15.5)
WBC: 16.5 10*3/uL — ABNORMAL HIGH (ref 4.0–10.5)
nRBC: 0 % (ref 0.0–0.2)

## 2022-11-09 MED ORDER — DOXYCYCLINE HYCLATE 100 MG PO TABS: 100.0000 mg | ORAL_TABLET | Freq: Two times a day (BID) | ORAL | 0 refills | Status: DC

## 2022-11-09 NOTE — ED Provider Triage Note (Signed)
Emergency Medicine Provider Triage Evaluation Note  Dana Bryan , a 71 y.o. female  was evaluated in triage.  Pt complains of left sided chest pain and SOB.  Recent diagnosed with PNA, started on doxycycline Sunday.  States since then worsening pain and increased SOB.  Negative covid/flu testing.  Denies fever  Review of Systems  Positive: Chest pain, SOB Negative: fever  Physical Exam  BP (!) 138/41 (BP Location: Left Arm)   Pulse (!) 49   Temp 97.8 F (36.6 C) (Oral)   Resp 16   SpO2 97%  Gen:   Awake, no distress   Resp:  Normal effort  MSK:   Moves extremities without difficulty  Other:    Medical Decision Making  Medically screening exam initiated at 11:06 PM.  Appropriate orders placed.  Dana Bryan was informed that the remainder of the evaluation will be completed by another provider, this initial triage assessment does not replace that evaluation, and the importance of remaining in the ED until their evaluation is complete.  Chset pain, SOB.  Recently diagnosed with PNA, currently on doxycycline.  EKG, labs, CXR.   Dana Pickett, PA-C 11/09/22 2307

## 2022-11-09 NOTE — Telephone Encounter (Signed)
Informed pt of chest xray result. She will start the medication today per pt . She is asking what can she take for pain she states the tylenol and IBU is not helping and not sleeping

## 2022-11-09 NOTE — Telephone Encounter (Signed)
Pt states the pain is in her ribs and runs into her neck .

## 2022-11-09 NOTE — Telephone Encounter (Signed)
-----   Message from Midge Minium, MD sent at 11/09/2022  7:31 AM EST ----- As I was worried/suspected, your chest xray shows L lower lobe pneumonia.  Please start the Doxycycline twice daily (take w/ food).  This is available for pick up at your pharmacy.

## 2022-11-09 NOTE — Telephone Encounter (Signed)
Where is she having pain?  She can use the promethazine cough syrup to help w/ sleep.  Hopefully starting the antibiotics will improve things.  But if needed, we can send in something for pain if appropriate

## 2022-11-09 NOTE — ED Triage Notes (Signed)
Pt reported to ED for further evaluation of recent pneumonia diagnosis after experiencing left sided chest pain that increases with inspiration.

## 2022-11-10 ENCOUNTER — Observation Stay (HOSPITAL_COMMUNITY): Payer: Medicare HMO

## 2022-11-10 DIAGNOSIS — J189 Pneumonia, unspecified organism: Secondary | ICD-10-CM | POA: Diagnosis present

## 2022-11-10 DIAGNOSIS — J918 Pleural effusion in other conditions classified elsewhere: Secondary | ICD-10-CM | POA: Diagnosis not present

## 2022-11-10 DIAGNOSIS — E878 Other disorders of electrolyte and fluid balance, not elsewhere classified: Secondary | ICD-10-CM | POA: Diagnosis present

## 2022-11-10 DIAGNOSIS — J9811 Atelectasis: Secondary | ICD-10-CM | POA: Diagnosis not present

## 2022-11-10 DIAGNOSIS — R0781 Pleurodynia: Secondary | ICD-10-CM | POA: Diagnosis present

## 2022-11-10 DIAGNOSIS — J90 Pleural effusion, not elsewhere classified: Secondary | ICD-10-CM | POA: Diagnosis not present

## 2022-11-10 DIAGNOSIS — K219 Gastro-esophageal reflux disease without esophagitis: Secondary | ICD-10-CM | POA: Diagnosis present

## 2022-11-10 LAB — BASIC METABOLIC PANEL
Anion gap: 15 (ref 5–15)
Anion gap: 11 (ref 5–15)
BUN: 15 mg/dL (ref 8–23)
BUN: 10 mg/dL (ref 8–23)
CO2: 21 mmol/L — ABNORMAL LOW (ref 22–32)
CO2: 19 mmol/L — ABNORMAL LOW (ref 22–32)
Calcium: 8.6 mg/dL — ABNORMAL LOW (ref 8.9–10.3)
Calcium: 7.8 mg/dL — ABNORMAL LOW (ref 8.9–10.3)
Chloride: 93 mmol/L — ABNORMAL LOW (ref 98–111)
Chloride: 99 mmol/L (ref 98–111)
Creatinine, Ser: 0.63 mg/dL (ref 0.44–1.00)
Creatinine, Ser: 0.62 mg/dL (ref 0.44–1.00)
GFR, Estimated: >60 mL/min (ref >60)
GFR, Estimated: >60 mL/min (ref >60)
Glucose, Bld: 149 mg/dL — ABNORMAL HIGH (ref 70–99)
Glucose, Bld: 102 mg/dL — ABNORMAL HIGH (ref 70–99)
Potassium: 3.4 mmol/L — ABNORMAL LOW (ref 3.5–5.1)
Potassium: 3.6 mmol/L (ref 3.5–5.1)
Sodium: 129 mmol/L — ABNORMAL LOW (ref 135–145)
Sodium: 129 mmol/L — ABNORMAL LOW (ref 135–145)

## 2022-11-10 LAB — TROPONIN I (HIGH SENSITIVITY)
Troponin I (High Sensitivity): 7 ng/L (ref <18)
Troponin I (High Sensitivity): 9 ng/L (ref <18)

## 2022-11-10 LAB — LACTIC ACID, PLASMA: Lactic Acid, Venous: 1.4 mmol/L (ref 0.5–1.9)

## 2022-11-10 MED ORDER — POTASSIUM CHLORIDE 20 MEQ PO PACK
40.0000 meq | PACK | Freq: Once | ORAL | Status: AC
  Administered 2022-11-10: 40 meq via ORAL
  Filled 2022-11-10: qty 2

## 2022-11-10 MED ORDER — SODIUM CHLORIDE 0.9 % IV SOLN
1.0000 g | INTRAVENOUS | Status: AC
  Administered 2022-11-11 – 2022-11-15 (×5): 1 g via INTRAVENOUS
  Filled 2022-11-10 (×5): qty 10

## 2022-11-10 MED ORDER — ACETAMINOPHEN 500 MG PO TABS
1000.0000 mg | ORAL_TABLET | Freq: Four times a day (QID) | ORAL | Status: DC | PRN
  Administered 2022-11-10 – 2022-11-11 (×2): 1000 mg via ORAL
  Filled 2022-11-10 (×2): qty 2

## 2022-11-10 MED ORDER — BUSPIRONE HCL 5 MG PO TABS
7.5000 mg | ORAL_TABLET | Freq: Two times a day (BID) | ORAL | Status: DC
  Administered 2022-11-10 – 2022-11-18 (×17): 7.5 mg via ORAL
  Filled 2022-11-10 (×3): qty 2
  Filled 2022-11-10: qty 1
  Filled 2022-11-10 (×3): qty 2
  Filled 2022-11-10: qty 1
  Filled 2022-11-10 (×5): qty 2
  Filled 2022-11-10 (×2): qty 1
  Filled 2022-11-10: qty 2
  Filled 2022-11-10: qty 1
  Filled 2022-11-10 (×2): qty 2

## 2022-11-10 MED ORDER — CALCIUM CARBONATE ANTACID 500 MG PO CHEW
1.0000 | CHEWABLE_TABLET | Freq: Two times a day (BID) | ORAL | Status: DC
  Administered 2022-11-10 – 2022-11-16 (×4): 200 mg via ORAL
  Filled 2022-11-10 (×14): qty 1

## 2022-11-10 MED ORDER — PANTOPRAZOLE SODIUM 40 MG PO TBEC
40.0000 mg | DELAYED_RELEASE_TABLET | Freq: Every day | ORAL | Status: DC
  Administered 2022-11-10 – 2022-11-18 (×9): 40 mg via ORAL
  Filled 2022-11-10 (×9): qty 1

## 2022-11-10 MED ORDER — ALBUTEROL SULFATE HFA 108 (90 BASE) MCG/ACT IN AERS: 2.0000 | INHALATION_SPRAY | RESPIRATORY_TRACT | Status: DC | PRN

## 2022-11-10 MED ORDER — ACETAMINOPHEN 325 MG PO TABS
650.0000 mg | ORAL_TABLET | Freq: Four times a day (QID) | ORAL | Status: DC | PRN
  Administered 2022-11-10: 650 mg via ORAL
  Filled 2022-11-10: qty 2

## 2022-11-10 MED ORDER — ENOXAPARIN SODIUM 40 MG/0.4ML IJ SOSY
40.0000 mg | PREFILLED_SYRINGE | INTRAMUSCULAR | Status: DC
  Administered 2022-11-11 – 2022-11-17 (×7): 40 mg via SUBCUTANEOUS
  Filled 2022-11-10 (×7): qty 0.4

## 2022-11-10 MED ORDER — SODIUM CHLORIDE 0.9 % IV SOLN
1.0000 g | Freq: Once | INTRAVENOUS | Status: AC
  Administered 2022-11-10: 1 g via INTRAVENOUS
  Filled 2022-11-10: qty 10

## 2022-11-10 MED ORDER — BUPROPION HCL ER (XL) 150 MG PO TB24
300.0000 mg | ORAL_TABLET | Freq: Every day | ORAL | Status: DC
  Administered 2022-11-10 – 2022-11-18 (×9): 300 mg via ORAL
  Filled 2022-11-10 (×2): qty 2
  Filled 2022-11-10 (×2): qty 1
  Filled 2022-11-10 (×5): qty 2
  Filled 2022-11-10: qty 1

## 2022-11-10 MED ORDER — ALBUTEROL SULFATE (2.5 MG/3ML) 0.083% IN NEBU: 2.5000 mg | INHALATION_SOLUTION | RESPIRATORY_TRACT | Status: DC | PRN

## 2022-11-10 MED ORDER — ACETAMINOPHEN 325 MG PO TABS
325.0000 mg | ORAL_TABLET | Freq: Once | ORAL | Status: AC
  Administered 2022-11-10: 325 mg via ORAL
  Filled 2022-11-10: qty 1

## 2022-11-10 MED ORDER — SODIUM CHLORIDE 0.9 % IV SOLN
500.0000 mg | Freq: Once | INTRAVENOUS | Status: AC
  Administered 2022-11-10: 500 mg via INTRAVENOUS
  Filled 2022-11-10: qty 5

## 2022-11-10 MED ORDER — ATORVASTATIN CALCIUM 10 MG PO TABS
20.0000 mg | ORAL_TABLET | Freq: Every day | ORAL | Status: DC
  Administered 2022-11-10 – 2022-11-18 (×9): 20 mg via ORAL
  Filled 2022-11-10 (×9): qty 2

## 2022-11-10 MED ORDER — POTASSIUM CHLORIDE 10 MEQ/100ML IV SOLN
10.0000 meq | INTRAVENOUS | Status: DC
  Administered 2022-11-10: 10 meq via INTRAVENOUS
  Filled 2022-11-10: qty 100

## 2022-11-10 MED ORDER — IOHEXOL 350 MG/ML SOLN
50.0000 mL | Freq: Once | INTRAVENOUS | Status: AC | PRN
  Administered 2022-11-10: 50 mL via INTRAVENOUS

## 2022-11-10 MED ORDER — SODIUM CHLORIDE 0.9 % IV SOLN
500.0000 mg | INTRAVENOUS | Status: AC
  Administered 2022-11-11 – 2022-11-13 (×3): 500 mg via INTRAVENOUS
  Filled 2022-11-10 (×3): qty 5

## 2022-11-10 MED ORDER — IBUPROFEN 400 MG PO TABS
600.0000 mg | ORAL_TABLET | Freq: Once | ORAL | Status: AC
  Administered 2022-11-10: 600 mg via ORAL
  Filled 2022-11-10: qty 1

## 2022-11-10 NOTE — ED Provider Notes (Signed)
[facility] EMERGENCY DEPARTMENT Provider Note   CSN: [id number] Arrival date & time: 11/09/22  2251     History Chief Complaint  Patient presents with   Pneumonia    Dana Bryan is a 71 y.o. female with history of hyperlipidemia, bicuspid aortic valve, presents to the emergency department for evaluation of left-sided rib pain, cough for 2 weeks.  Patient reports that she has a productive cough with phlegm, no blood.  She has a runny nose but denies any suggestion or any fevers.  She works that she starting to have some left-sided chest pain with coughing and also with movement.  She reports that she has pain with taking breath as well.  She was seen at her primary care office and was given prescription cough syrup as well as a chest x-ray.  She was called in some antibiotics yesterday.  Patient had 2 doses.  She reports that she had a vomiting episode yesterday from the antibiotic, but otherwise has not had any emesis, nausea, or abdominal pain.  She feels short of breath as it hurts to take a deep breath in.  She presents to the emergency department because of the left-sided chest pain.   Pneumonia Associated symptoms include chest pain and shortness of breath. Pertinent negatives include no abdominal pain and no headaches.       Home Medications Prior to Admission medications   Medication Sig Start Date End Date Taking? Authorizing Provider  albuterol (VENTOLIN HFA) 108 (90 Base) MCG/ACT inhaler Inhale 2 puffs into the lungs every 6 (six) hours as needed for wheezing or shortness of breath. 11/06/22   Midge Minium, MD  ALPRAZolam Duanne Moron) 0.25 MG tablet Take 1 tablet (0.25 mg total) by mouth daily as needed. 06/11/22   Midge Minium, MD  Ascorbic Acid (VITAMIN C PO) Take 500 mg by mouth daily.    [provider]  atorvastatin (LIPITOR) 20 MG tablet TAKE 1 TABLET BY MOUTH EVERY DAY 08/25/22   Midge Minium, MD  buPROPion  (WELLBUTRIN XL) 300 MG 24 hr tablet Take 1 tablet (300 mg total) by mouth daily. 06/01/22   Midge Minium, MD  busPIRone (BUSPAR) 7.5 MG tablet TAKE 1 TABLET BY MOUTH TWICE A DAY 10/08/22   Midge Minium, MD  Cholecalciferol (VITAMIN D3 PO) Take 2,000 Units by mouth.    [provider]  clobetasol cream (TEMOVATE) 6.28 % Apply 1 application  topically 2 (two) times daily as needed (irritation).    [provider]  doxycycline (VIBRA-TABS) 100 MG tablet Take 1 tablet (100 mg total) by mouth 2 (two) times daily. 11/09/22   Midge Minium, MD  ibuprofen (ADVIL,MOTRIN) 200 MG tablet Take 400 mg by mouth daily as needed for headache.    [provider]  promethazine-dextromethorphan (PROMETHAZINE-DM) 6.25-15 MG/5ML syrup Take 5 mLs by mouth 4 (four) times daily as needed. 11/06/22   Midge Minium, MD  zinc gluconate 50 MG tablet Take 50 mg by mouth daily.    [provider]      Allergies    Chlorhexidine gluconate    Review of Systems   Review of Systems  Constitutional:  Negative for chills and fever.  HENT:  Positive for rhinorrhea. Negative for congestion.   Respiratory:  Positive for cough and shortness of breath.   Cardiovascular:  Positive for chest pain.  Gastrointestinal:  Positive for vomiting. Negative for abdominal pain and nausea.  Neurological:  Negative for headaches.  Physical Exam Updated Vital Signs BP 139/81   Pulse (!) 52   Temp 98.6 F (37 C)   Resp 17   SpO2 97%  Physical Exam Vitals and nursing note reviewed.  Constitutional:      General: She is not in acute distress.    Appearance: Normal appearance. She is not ill-appearing or toxic-appearing.  HENT:     Head: Normocephalic and atraumatic.     Mouth/Throat:     Mouth: Mucous membranes are moist.  Eyes:     General: No scleral icterus. Cardiovascular:     Rate and Rhythm: Regular rhythm. Bradycardia present.     Heart sounds: Murmur heard.   Pulmonary:     Effort: Pulmonary effort is normal. No respiratory distress.     Breath sounds: Normal breath sounds.     Comments: Diminished more on the left.  No respiratory distress, accessory muscle use, nasal flaring, tripoding, or cyanosis present.  She is satting 97% on room air without any increased work of breathing. Musculoskeletal:        General: No deformity.     Cervical back: Normal range of motion.  Skin:    General: Skin is warm and dry.  Neurological:     General: No focal deficit present.     Mental Status: She is alert. Mental status is at baseline.     ED Results / Procedures / Treatments   Labs (all labs ordered are listed, but only abnormal results are displayed) Labs Reviewed  CBC WITH DIFFERENTIAL/PLATELET - Abnormal; Notable for the following components:      Result Value   WBC 16.5 (*)    Neutro Abs 14.4 (*)    Monocytes Absolute 1.1 (*)    Abs Immature Granulocytes 0.26 (*)    All other components within normal limits  BASIC METABOLIC PANEL - Abnormal; Notable for the following components:   Sodium 129 (*)    Potassium 3.4 (*)    Chloride 93 (*)    CO2 21 (*)    Glucose, Bld 149 (*)    Calcium 8.6 (*)    All other components within normal limits  CULTURE, BLOOD (ROUTINE X 2)  CULTURE, BLOOD (ROUTINE X 2)  LACTIC ACID, PLASMA  LACTIC ACID, PLASMA  TROPONIN I (HIGH SENSITIVITY)  TROPONIN I (HIGH SENSITIVITY)    EKG EKG Interpretation  Date/Time:  Monday November 09 2022 23:28:22 EST Ventricular Rate:  98 PR Interval:  160 QRS Duration: 144 QT Interval:  330 QTC Calculation: 421 R Axis:   32 Text Interpretation: Sinus rhythm with Premature atrial complexes in a pattern of bigeminy and Premature ventricular complexes or Fusion complexes Left bundle branch block that is not seen on prior tracings Abnormal ECG When compared with ECG of 21-Mar-2019 20:29, PREVIOUS ECG IS PRESENT Confirmed by Cindee Lame [id number]) on 11/09/2022 11:32:08  PM  Radiology DG Chest 2 View  Result Date: 11/09/2022 CLINICAL DATA:  Pneumonia. Worsening pain and shortness of breath EXAM: CHEST - 2 VIEW COMPARISON:  Radiograph 11/06/2022 FINDINGS: Worsening left lower lobe opacity with development of pleural effusion. No focal airspace disease in the right lung. Normal heart size with stable mediastinal contours. There is mild biapical pleuroparenchymal scarring IMPRESSION: Worsening left lower lobe opacity with development of pleural effusion. Recommend radiographic follow-up to resolution. Electronically Signed   By: Keith Rake M.D.   On: 11/09/2022 23:37    Procedures Procedures   Medications Ordered in ED Medications  cefTRIAXone (ROCEPHIN) 1 g in  sodium chloride 0.9 % 100 mL IVPB (1 g Intravenous New Bag/Given 11/10/22 0758)  azithromycin (ZITHROMAX) 500 mg in sodium chloride 0.9 % 250 mL IVPB (has no administration in time range)  ibuprofen (ADVIL) tablet 600 mg (600 mg Oral Given 11/10/22 6415)    ED Course/ Medical Decision Making/ A&P                           Medical Decision Making Amount and/or Complexity of Data Reviewed Labs: ordered.  Risk Decision regarding hospitalization.   71 year old female presents emerged part for evaluation of left-sided chest pain, pain with inspiration, and worsening cough.  Differential diagnosis includes was limited to worsening pneumonia, ACS, arrhythmia, pneumothorax, PE, dissection, aneurysm.  Vital signs show normotensive, afebrile, bradycardia, otherwise satting well on room air without increased work of breathing.  Exam as noted above.  Labs and imaging orders in triage.  I independently reviewed and interpreted the patient's labs.  Patient has an elevated white count at 16.5 with a left shift.  No anemia.  BMP shows significant hyponatremia which is new for the patient at 129.  Mild decrease in potassium 3.4.  Chloride 93.  Bicarb at 21 and glucose 149.  Patient also decreased calcium at  8.6.  Troponin was at 7 with a repeat at 9.  Blood cultures and lactic pending.  CXR shows Worsening left lower lobe opacity with development of pleural effusion. Recommend radiographic follow-up to resolution.  Give the white count, worsening pneumonia, and hyponatremia, I think this patient could benefit from a hospital admission. Patient is amenable. Started on azithromycin and rocephin. Will admit to family medicine teaching service. Discussed with the admitting provider about CT chest to r/o emphyema, but patient is well appearing. Admitting team will decide after discussion with their attending.  I discussed this case with my attending physician who cosigned this note including patient's presenting symptoms, physical exam, and planned diagnostics and interventions. Attending physician stated agreement with plan or made changes to plan which were implemented.   Final Clinical Impression(s) / ED Diagnoses Final diagnoses:  Community acquired pneumonia of left lower lobe of lung    Rx / DC Orders ED Discharge Orders     None         Sherrell Puller, PA-C 11/10/22 8309    Gareth Morgan, MD 11/10/22 1212

## 2022-11-10 NOTE — Assessment & Plan Note (Addendum)
Stable. Cardiac monitoring discontinued. - Potential cardiology follow up outpatient for Zio  - Monitor symptoms

## 2022-11-10 NOTE — Assessment & Plan Note (Deleted)
Protonix started, see above for cardiac work up

## 2022-11-10 NOTE — Telephone Encounter (Signed)
Oh no

## 2022-11-10 NOTE — Consult Note (Signed)
NAME:  Dana Bryan, MRN:  [id number], DOB:  Aug 28, 1951, LOS: 0 ADMISSION DATE:  11/09/2022, CONSULTATION DATE:  11/21 REFERRING MD:  Larae Grooms , REASON FOR CONSULT:  Left pleural effusion   History of Present Illness:  Dana Bryan is an 71 y.o. F who presented to [facility] on 11/21 with left sided chest pain.  Patient with reports of 2 to 4 weeks of ongoing cough without fever or chills.  On 11/20 she developed pleuritic chest pain which began to worsen.  She eventually presented to the [facility] emergency department.  Troponin within normal limits.  CT chest notable for community-acquired pneumonia of the right upper lobe, left lower lobe loculated pleural effusion.  The patient was admitted to the family medicine teaching service.  PCCM was consulted for management of left pleural effusion.  Pertinent  Medical History  Bicuspid aortic valve, mood disorder, left bundle branch block, smoking history 20 pack years.  Significant Hospital Events: Including procedures, antibiotic start and stop dates in addition to other pertinent events   11/21 presented to [facility], CT chest, IR consult  Interim History / Subjective:  See above  Subjective: endorses pleuritic chest pain, denies significant shortness of breath and was coughing.  Objective   Blood pressure (!) 149/77, pulse 95, temperature 98.2 F (36.8 C), resp. rate 17, SpO2 96 %.       No intake or output data in the 24 hours ending 11/10/22 1537 There were no vitals filed for this visit.  Examination: General: In bed, NAD, appears comfortable HEENT: MM pink/moist, anicteric, atraumatic Neuro: RASS 0, PERRL 67m, GCS 15 CV: S1S2, NSR, no m/r/g appreciated PULM:  air movement in all lobes, trachea midline, chest expansion symmetric GI: soft, bsx4 active, non-tender   Extremities: warm/dry, no pretibial edema, capillary refill less than 3 seconds  Skin:  no rashes or lesions noted  Left lung  [place]      Lab/imaging Troponin 7 > 9 Lactic 1.4 K3.4, sodium 129 Flu COVID-negative WBC 16.5 Blood cultures pending  Resolved Hospital Problem list     Assessment & Plan:  Small loculated left pleural effusion seen on CT chest with contrast, suspect secondary to infection See [place]. 20 pack year smoking history. Worked as bCustomer service manager Currently retired. -IR consulted for placement of pigtail catheter -CT chest in 3 months to ensure resolution per rads -Will possibly need tube lytics  CAP on RUL and LLL On room air, afebrile, WBC 16.5 -Agree with Azithromycin and rocephin, follow up cultures -Pulmonary toilet as able -Continue on continuous tele and spo2 monitoring -Add urine strep and Legionella  All other issues per primary- PCCM will continue to follow. Please call with questions.  Best Practice (right click and "Reselect all SmartList Selections" daily)   Per primary  Labs   CBC: Recent Labs  Lab 11/09/22 2311  WBC 16.5*  NEUTROABS 14.4*  HGB 12.2  HCT 36.2  MCV 91.0  PLT 2706   Basic Metabolic Panel: Recent Labs  Lab 11/09/22 2311  NA 129*  K 3.4*  CL 93*  CO2 21*  GLUCOSE 149*  BUN 15  CREATININE 0.63  CALCIUM 8.6*   GFR: Estimated Creatinine Clearance: 54.9 mL/min (by C-G formula based on SCr of 0.63 mg/dL). Recent Labs  Lab 11/09/22 2311 11/10/22 0716  WBC 16.5*  --   LATICACIDVEN  --  1.4    Liver Function Tests: No results for input(s): "AST", "ALT", "ALKPHOS", "BILITOT", "PROT", "ALBUMIN" in the last  168 hours. No results for input(s): "LIPASE", "AMYLASE" in the last 168 hours. No results for input(s): "AMMONIA" in the last 168 hours.  ABG No results found for: "PHART", "PCO2ART", "PO2ART", "HCO3", "TCO2", "ACIDBASEDEF", "O2SAT"   Coagulation Profile: No results for input(s): "INR", "PROTIME" in the last 168 hours.  Cardiac Enzymes: No results for input(s): "CKTOTAL", "CKMB", "CKMBINDEX", "TROPONINI" in the last 168  hours.  HbA1C: Hgb A1c MFr Bld  Date/Time Value Ref Range Status  03/22/2019 05:33 AM 5.3 4.8 - 5.6 % Final    Comment:    (NOTE) Pre diabetes:          5.7%-6.4% Diabetes:              >6.4% Glycemic control for   <7.0% adults with diabetes   12/09/2017 07:26 AM 5.7 4.6 - 6.5 % Final    Comment:    Glycemic Control Guidelines for People with Diabetes:Non Diabetic:  <6%Goal of Therapy: <7%Additional Action Suggested:  >8%     CBG: No results for input(s): "GLUCAP" in the last 168 hours.  Review of Systems:   Positives in bold  Gen: fever, chills, weight change, fatigue, night sweats HEENT:  blurred vision, double vision, hearing loss, tinnitus, sinus congestion, rhinorrhea, sore throat, neck stiffness, dysphagia PULM:  shortness of breath, cough, sputum production, hemoptysis, wheezing CV: chest pain, edema, orthopnea, paroxysmal nocturnal dyspnea, palpitations GI:  abdominal pain, nausea, vomiting, diarrhea, hematochezia, melena, constipation, change in bowel habits GU: dysuria, hematuria, polyuria, oliguria, urethral discharge Endocrine: hot or cold intolerance, polyuria, polyphagia or appetite change Derm: rash, dry skin, scaling or peeling skin change Heme: easy bruising, bleeding, bleeding gums Neuro: headache, numbness, weakness, slurred speech, loss of memory or consciousness   Past Medical History:  She,  has a past medical history of Anxiety, Basal cell carcinoma, Cancer (HCC), Cataract, Generalized headaches, Heart murmur, Hyperlipidemia, Reactive depression (situational), Squamous cell skin cancer, finger, and Vasomotor rhinitis.   Surgical History:   Past Surgical History:  Procedure Laterality Date   BUBBLE STUDY  09/04/2022   Procedure: BUBBLE STUDY;  Surgeon: Geralynn Rile, MD;  Location: [facility];  Service: Cardiovascular;;   COLONOSCOPY  2008   normal    TEE WITHOUT CARDIOVERSION N/A 09/04/2022   Procedure: TRANSESOPHAGEAL ECHOCARDIOGRAM  (TEE);  Surgeon: Geralynn Rile, MD;  Location: [facility];  Service: Cardiovascular;  Laterality: N/A;   TONSILLECTOMY  1973     Social History:   reports that she quit smoking about 18 years ago. Her smoking use included cigarettes. She has never used smokeless tobacco. She reports current alcohol use of about 15.0 standard drinks of alcohol per week. She reports that she does not use drugs.   Family History:  Her family history includes Alcohol abuse in her father; Heart attack in her mother; Hypertension in her father and mother. There is no history of Colon polyps, Colon cancer, Esophageal cancer, Stomach cancer, or Rectal cancer.   Allergies Allergies  Allergen Reactions   Chlorhexidine Gluconate Rash     Home Medications  Prior to Admission medications   Medication Sig Start Date End Date Taking? Authorizing Provider  ALPRAZolam (XANAX) 0.25 MG tablet Take 1 tablet (0.25 mg total) by mouth daily as needed. Patient taking differently: Take 0.25 mg by mouth daily as needed for anxiety. 06/11/22  Yes Midge Minium, MD  Ascorbic Acid (VITAMIN C PO) Take 500 mg by mouth daily.   Yes [provider]  atorvastatin (LIPITOR) 20 MG tablet TAKE  1 TABLET BY MOUTH EVERY DAY Patient taking differently: Take 20 mg by mouth daily. 08/25/22  Yes Midge Minium, MD  buPROPion (WELLBUTRIN XL) 300 MG 24 hr tablet Take 1 tablet (300 mg total) by mouth daily. 06/01/22  Yes Midge Minium, MD  busPIRone (BUSPAR) 7.5 MG tablet TAKE 1 TABLET BY MOUTH TWICE A DAY Patient taking differently: Take 7.5 mg by mouth 2 (two) times daily. 10/08/22  Yes Midge Minium, MD  Cholecalciferol (VITAMIN D3 PO) Take 2,000 Units by mouth.   Yes [provider]  clobetasol cream (TEMOVATE) 4.70 % Apply 1 application  topically 2 (two) times daily as needed (irritation).   Yes [provider]  cyanocobalamin (VITAMIN B12) 1000 MCG tablet Take 1,000 mcg by mouth daily.    Yes [provider]  doxycycline (VIBRA-TABS) 100 MG tablet Take 1 tablet (100 mg total) by mouth 2 (two) times daily. 11/09/22  Yes Midge Minium, MD  ibuprofen (ADVIL,MOTRIN) 200 MG tablet Take 400 mg by mouth daily as needed for headache.   Yes [provider]  promethazine-dextromethorphan (PROMETHAZINE-DM) 6.25-15 MG/5ML syrup Take 5 mLs by mouth 4 (four) times daily as needed. Patient taking differently: Take 5 mLs by mouth 4 (four) times daily as needed for cough. 11/06/22  Yes Midge Minium, MD  zinc gluconate 50 MG tablet Take 50 mg by mouth daily.   Yes [provider]  albuterol (VENTOLIN HFA) 108 (90 Base) MCG/ACT inhaler Inhale 2 puffs into the lungs every 6 (six) hours as needed for wheezing or shortness of breath. 11/06/22   Midge Minium, MD     Critical care time: N/A    [employer] MSN, APRN, AGACNP-BC [facility]  11/10/2022 , 3:37 PM  Please see [email or web address] for pager details  If no response, please call [phone] After hours, please call Elink at [phone]'

## 2022-11-10 NOTE — Progress Notes (Signed)
FMTS Interim Progress Note  Went to bedside to check on patient alongside student doctor Leonette Monarch to discuss CT chest result and informed patient that we have consulted pulmonology. All questions answered and patient in agreement with plan in place. She reports feeling better but having pleuritic pain with coughing. Informed patient to let us know if she needs anything, she was appreciative.    Donney Dice, DO 11/10/2022, 3:14 PM PGY-3, [facility] pager [phone]

## 2022-11-10 NOTE — Assessment & Plan Note (Addendum)
Stable. Remain afebrile with good work of breathing on room air. Blood culture and chest tube culture NGTD. Antibiotics completed. - Daily CBC/BMP - Schedule Tylenol q6hr - Continue to monitor routine vitals

## 2022-11-10 NOTE — Progress Notes (Signed)
OT Cancellation Note  Patient Details Name: Dana Bryan MRN: [id number] DOB: 1951-04-23   Cancelled Treatment:    Reason Eval/Treat Not Completed: OT screened, no needs identified, will sign off Discussed with pt and RN. Pt has been ambulating to bathroom without assist and without safety concerns. Reports R sided pain that causes "some SOB" but does not impact ability to complete ADLs/mobility. No formal OT eval needed.  Layla Maw 11/10/2022, 1:29 PM

## 2022-11-10 NOTE — Hospital Course (Addendum)
Dana Bryan is a 71 year old female with past medical history of HTN and HLD admitted for left sided CAP with parapneumonic effusion and pleuritic chest pain.  Community acquired pneumonia with pleural effusion On admission patient had left sided pleuritic chest pain. CXR demonstrated worsening left lobe opacity with development of pleural effusion. CT chest demonstrated small loculated left pleural effusion with associated left lower lobe consolidation. Patchy ground-glass opacities of the RUL and aortic atherosclerosis as well. Pulmonology and CT surgery were consulted and recommended patient get CT-guided pigtail chest tube and TPN dornase once tube is placed to help clear septations. Chest tube was placed 11/22. IV azithromycin and rocephin course was completed along with 4 fibrinolysis treatments. Repeat CT chest was done and demonstrated a smaller pleural effusion still present and appropriate placement of chest tube. Her pain has been managed with scheduled oxycodone, toradol, and tylenol q6h PRN. Chest tube was removed 11/28. CXR upon removal demonstrated no radiographic change in the multilocular pleural effusion and there is still some consolidation/atelectasis present. Pulmonology recommends Augmentin PO for 14 days more days. Will be discharged on 5 tablets of 5 mg oxycodone to have on hand for pain control and Albuterol PRN for SOB d/t COPD seen on CXR. Repeat CXR outpatient with pulmonology.   Aortic Stenosis ECHO in 08/2022 showed LVEF 60-65% and the LV has normal function along with moderate aortic stenosis with bicuspid aortic valve. Potentially could need a cardiology follow up outpatient for [place].

## 2022-11-10 NOTE — ED Notes (Signed)
Patient denies pain and is resting comfortably.  

## 2022-11-10 NOTE — Telephone Encounter (Signed)
Pt is now appropriately in ER as she is having L sided chest pain that is worse w/ breathing.  Will hold on pain meds at this time

## 2022-11-10 NOTE — ED Notes (Signed)
Per patient's request. IV potassium was stopped at this time. PT states she can't tolerate the irritation from it. Admitting team informed.

## 2022-11-10 NOTE — ED Notes (Signed)
PT to CT.

## 2022-11-10 NOTE — Progress Notes (Signed)
PT Cancellation Note  Patient Details Name: Dana Bryan MRN: [id number] DOB: 12/18/1951   Cancelled Treatment:    Reason Eval/Treat Not Completed: Patient at procedure or test/unavailable.  Gone to CT, reattempt at another time.   Ramond Dial 11/10/2022, 11:13 AM  Mee Hives, PT PhD Acute Rehab Dept. Number: [place] and [place]

## 2022-11-10 NOTE — ED Notes (Signed)
Pulmonology at bedside assessing pt at this time. Pt is having frequent runs of Vtach, complaining of palpitations and chest pain during episodes. Pt continues to be A&O X 4, denies dizziness or lightheadedness. Other VSS. Secure chat sent to hospitalist. Pulmonology at bedside states the monitor is showing LBBB, not concerning for Vtach.

## 2022-11-10 NOTE — Progress Notes (Signed)
PT Cancellation Note  Patient Details Name: Dana Bryan MRN: [id number] DOB: 02/14/1951   Cancelled Treatment:    Reason Eval/Treat Not Completed: Other (comment).  Pt screened, feels she is at baseline and has been walking to BR without help from nursing.  Will dc physical therapy for now, signing off.   Ramond Dial 11/10/2022, 2:28 PM  Mee Hives, PT PhD Acute Rehab Dept. Number: [place] and [place]

## 2022-11-10 NOTE — Progress Notes (Signed)
FMTS Interim Progress Note  Consulted pulmonology given patient's CT chest demonstrating small loculated left pleural effusion. Patient reassuringly remains asymptomatic and not hypoxic other than endorsing pleuritic chest pain. Unsure if patient may require a pleural tap. Spoke with Georgann Housekeeper, NP, who says that he will come see her and place recommendations. Appreciate involvement and recommendations of pulmonology team.   Donney Dice, DO 11/10/2022, 2:49 PM PGY-3, [facility] Service pager [phone]

## 2022-11-10 NOTE — H&P (Addendum)
Hospital Admission History and Physical Service Pager: [phone]  Patient name: Dana Bryan Medical record number: [id number] Date of Birth: 09/13/1951 Age: 71 y.o. Gender: female  Primary Care Provider: Midge Minium, MD Consultants: None  Code Status: FULL  Preferred Emergency Contact:  Contact Information     Name Relation Home Work [place] Spouse [phone]  [phone]       Chief Complaint: pleuritic chest pain  Assessment and Plan: Vennela Jutte is a 71 y.o. female presenting with onset of left sided pleuritic chest pain. Differential for this patient's presentation of this includes pneumonia causing developing pleural effusion seems to be most viable cause, but she does not seem to be experiencing any infectious symptoms while afebrile, without lactic acidosis or tachycardia/tachypnea. Multiple other possibilities in relation to developing pleural effusion including CHF although unlikely as the pleural effusion is limited to one side and echo previously showed LVEF 60-65% with aortic stenosis and bicuspid valve. Unlikely related to malignancy as she does not seem to have systemic or B symptoms but with smoking history, higher on the differential. No evidence of nephrotic syndrome, cirrhosis, or pancreatitis on physical examination. Well's score 0 without tachycardia or tachypnea or predisposing factor to PE.   Community acquired pneumonia complicated by pleural effusion  Patient maintains oxygenation on room air, shows no signs of sepsis. Trops negative, EKG unchanged from previous with LBBB>>does not appear cardiac related. Remains otherwise euvolemic on examination. Given presentation without many infectious symptoms, continuing with CT chest for further characterization. Consider thoracentesis if persistent lack of improvement medically.  -- Admit to FMTS on med telemetry, attending Dr. Owens Shark - Daily CBC/BMP - Afternoon BMP per  below  - PT/OT eval and treat - Continuous cardiac monitoring - Sputum assessment and Gram stain - S/p azithromycin + Rocephin> de-escalate to oral when able - Blood culture x2 - Strict ins and outs -- CT chest w/ contrast    Electrolyte abnormality Hyponatremia to 129; K 3.4>repleted. Seems related to hypovolemic vs euvolemic hyponatremia, unsure if acute vs chronic without enough data. Hopeful that she corrects on her own. Will recheck BMP in the afternoon and anticipate need for correction via fluid initiation 0.9 NS.  - Repeat afternoon BMP  - K repleted   Aortic stenosis Echo 08/2022 with left ventricular ejection fraction, by estimation, is 60 to 65%. The left ventricle has normal function. Moderate aortic stenosis is present. The aortic valve is bicuspid. Patient reports fluttering that could cause concern for the possibility of paroxysmal Afib.  - Continuous cardiac monitoring - Needs cards outpatient for ?Zio  - Monitor symptoms, correct lytes   Acid reflux Protonix started, see above for cardiac work up   Alcohol abuse, daily use Liquor daily. CIWAs initiated, monitor for symptoms. No signs of withdrawal on admission   Anxiety and depression Take Welbutrin and Buspar at home. Rarely takes Xanax, reports that 30 day prescription lasts for 2 years. No current concern for benzo withdrawal as she rarely takes this.  - Home medications restarted   Hyperlipidemia Last lipid panel LDL 65 09/21/22 - Home Lipitor        FEN/GI: Regular  VTE Prophylaxis: Lovenox   Disposition: Med tele   History of Present Illness:  Dana Bryan is a 71 y.o. female presenting with left-sided pleuritic chest pain onset over last few days. Patient saw her PCP on Friday, 11/06/22 for an ongoing cough for 2 weeks without fever or chills. She  was sent for an XR and found progressive PNA. Started Doxycycline yesterday, 11/09/22. She began having significant pleuritic chest pain on the  left. She notes that she cannot take any deep breaths. On left side, she has a cramping sensation. Presented to the ER for this as it was getting worse but has not had true dyspnea. Solely pain with deep breathing.    Has had heart palpitations that have lasted for a long time (~1 hour) intermittently for 3-4 weeks. No chest pain or DOE.  Patient has a known LBBB and bicuspid aortic, follows with cardiology.    Has stress incontinence 2/2 to coughing but not urinary symptoms otherwise.   No nausea, vomiting. Small amount of diarrhea recently but nothing significant, per patient.    In the ED, she was started on azithro and CTX for coverage, WBC 16.5, otherwise CBC nml, Na 129, K 3.4   Review Of Systems: Per HPI with the following additions: No bowel incontinence or other respiratory complaints  Pertinent Past Medical History: Past Medical History:  Diagnosis Date   Anxiety    on meds   Basal cell carcinoma    Cancer (HCC)    Cataract    no sx    Generalized headaches    Heart murmur    per Dr.Tabori   Hyperlipidemia    on meds   Reactive depression (situational)    on meds   Squamous cell skin cancer, finger    Vasomotor rhinitis     Remainder reviewed in history tab.   Pertinent Past Surgical History: Past Surgical History:  Procedure Laterality Date   BUBBLE STUDY  09/04/2022   Procedure: BUBBLE STUDY;  Surgeon: Geralynn Rile, MD;  Location: [facility];  Service: Cardiovascular;;   COLONOSCOPY  2008   normal    TEE WITHOUT CARDIOVERSION N/A 09/04/2022   Procedure: TRANSESOPHAGEAL ECHOCARDIOGRAM (TEE);  Surgeon: Geralynn Rile, MD;  Location: [facility];  Service: Cardiovascular;  Laterality: N/A;   [place] reviewed in history tab.  Pertinent Social History: Tobacco use: Former-- smoked thirty years and quit 17-18 years ago  Alcohol use: Alcohol use daily, vodka tonic once daily, treats to a cosmo Other Substance use: None   Lives with   Pertinent Family History: Family History  Problem Relation Age of Onset   Heart attack Mother    Hypertension Mother    Hypertension Father    Alcohol abuse Father    Colon polyps Neg Hx    Colon cancer Neg Hx    Esophageal cancer Neg Hx    Stomach cancer Neg Hx    Rectal cancer Neg Hx      Remainder reviewed in history tab.   Important Outpatient Medications: Buspar  Welbutrin  Zinc  Vit C Vit D3  Xanax 30 day rx will last her 2 years   Remainder reviewed in medication history.   Objective: BP 139/81   Pulse (!) 52   Temp 98.3 F (36.8 C)   Resp 17   SpO2 97%  Exam: General: NAD, well appearance, pleasant caucasian female with husband at bedside Eyes: PERRLA/EOMI, nml conjunctivae  ENTM: No acute abnormalities without obvious deformities. Normal nares and external ears Neck: Nml ROM without LAD  Cardiovascular: Systolic murmur RUSB, RRR without gallops or rubs  Respiratory: left sided ronchi with LLL crackles and slight diminishment. Normal WOB Gastrointestinal: NTTP, BS active, nondistended/soft  MSK: Able to move all extremities with palpable DP pulses  Derm: No  rashes or jaundice  Neuro: A&Ox3 without focal deficits  Psych: Normal affect and mood   Labs:  CBC BMET  Recent Labs  Lab 11/09/22 2311  WBC 16.5*  HGB 12.2  HCT 36.2  PLT 274   Recent Labs  Lab 11/09/22 2311  NA 129*  K 3.4*  CL 93*  CO2 21*  BUN 15  CREATININE 0.63  GLUCOSE 149*  CALCIUM 8.6*     EKG:  Broad V1, V2 QRS complexes with LBBB. qTc 421  Imaging Studies Performed:  CXR Impression from Radiologist:  IMPRESSION: Worsening left lower lobe opacity with development of pleural effusion. Recommend radiographic follow-up to resolution.   My Interpretation: LLL consolidation with what appears to be developing pleural effusion    Erskine Emery, MD 11/10/2022, 10:48 AM PGY-2, [place] Intern pager: [phone], text pages  welcome Secure chat group [place]

## 2022-11-10 NOTE — Assessment & Plan Note (Deleted)
Electrolytes stable today. - Monitor closely with daily BMP

## 2022-11-10 NOTE — Assessment & Plan Note (Deleted)
Take Welbutrin and Buspar restarted upon admission. - Stable

## 2022-11-10 NOTE — Progress Notes (Signed)
IR requested to evaluate patient for left chest tube. Imaging reviewed by Dr. Pascal Lux who states patient needs CT Surgery evaluation prior to IR performing any procedure. Ordering providers made aware. No IR procedure planned at this time but IR will leave the order in place.  Soyla Dryer, [place] [phone] 11/10/2022, 4:53 PM

## 2022-11-10 NOTE — Assessment & Plan Note (Deleted)
Chronic and stable.  - Continue home atorvastatin 20 mg daily

## 2022-11-11 ENCOUNTER — Inpatient Hospital Stay (HOSPITAL_COMMUNITY): Payer: Medicare HMO

## 2022-11-11 ENCOUNTER — Encounter (HOSPITAL_COMMUNITY): Payer: Self-pay | Admitting: Student

## 2022-11-11 DIAGNOSIS — E785 Hyperlipidemia, unspecified: Secondary | ICD-10-CM | POA: Diagnosis not present

## 2022-11-11 DIAGNOSIS — J869 Pyothorax without fistula: Secondary | ICD-10-CM | POA: Diagnosis not present

## 2022-11-11 DIAGNOSIS — R091 Pleurisy: Secondary | ICD-10-CM | POA: Diagnosis not present

## 2022-11-11 DIAGNOSIS — R846 Abnormal cytological findings in specimens from respiratory organs and thorax: Secondary | ICD-10-CM | POA: Diagnosis not present

## 2022-11-11 DIAGNOSIS — K219 Gastro-esophageal reflux disease without esophagitis: Secondary | ICD-10-CM | POA: Diagnosis not present

## 2022-11-11 DIAGNOSIS — J189 Pneumonia, unspecified organism: Secondary | ICD-10-CM | POA: Diagnosis not present

## 2022-11-11 DIAGNOSIS — E876 Hypokalemia: Secondary | ICD-10-CM | POA: Diagnosis not present

## 2022-11-11 DIAGNOSIS — Z85828 Personal history of other malignant neoplasm of skin: Secondary | ICD-10-CM | POA: Diagnosis not present

## 2022-11-11 DIAGNOSIS — F419 Anxiety disorder, unspecified: Secondary | ICD-10-CM | POA: Diagnosis not present

## 2022-11-11 DIAGNOSIS — R0781 Pleurodynia: Secondary | ICD-10-CM | POA: Diagnosis not present

## 2022-11-11 DIAGNOSIS — J90 Pleural effusion, not elsewhere classified: Secondary | ICD-10-CM

## 2022-11-11 DIAGNOSIS — E871 Hypo-osmolality and hyponatremia: Secondary | ICD-10-CM | POA: Diagnosis not present

## 2022-11-11 DIAGNOSIS — J9811 Atelectasis: Secondary | ICD-10-CM | POA: Diagnosis not present

## 2022-11-11 DIAGNOSIS — N393 Stress incontinence (female) (male): Secondary | ICD-10-CM | POA: Diagnosis not present

## 2022-11-11 DIAGNOSIS — F3289 Other specified depressive episodes: Secondary | ICD-10-CM | POA: Diagnosis not present

## 2022-11-11 DIAGNOSIS — F1010 Alcohol abuse, uncomplicated: Secondary | ICD-10-CM | POA: Diagnosis not present

## 2022-11-11 DIAGNOSIS — Z87891 Personal history of nicotine dependence: Secondary | ICD-10-CM | POA: Diagnosis not present

## 2022-11-11 DIAGNOSIS — J449 Chronic obstructive pulmonary disease, unspecified: Secondary | ICD-10-CM | POA: Diagnosis not present

## 2022-11-11 DIAGNOSIS — Z79899 Other long term (current) drug therapy: Secondary | ICD-10-CM | POA: Diagnosis not present

## 2022-11-11 DIAGNOSIS — R69 Illness, unspecified: Secondary | ICD-10-CM | POA: Diagnosis not present

## 2022-11-11 DIAGNOSIS — Q231 Congenital insufficiency of aortic valve: Secondary | ICD-10-CM | POA: Diagnosis not present

## 2022-11-11 DIAGNOSIS — Z8249 Family history of ischemic heart disease and other diseases of the circulatory system: Secondary | ICD-10-CM | POA: Diagnosis not present

## 2022-11-11 DIAGNOSIS — Z811 Family history of alcohol abuse and dependence: Secondary | ICD-10-CM | POA: Diagnosis not present

## 2022-11-11 DIAGNOSIS — I10 Essential (primary) hypertension: Secondary | ICD-10-CM | POA: Diagnosis not present

## 2022-11-11 DIAGNOSIS — I700 Atherosclerosis of aorta: Secondary | ICD-10-CM | POA: Diagnosis not present

## 2022-11-11 DIAGNOSIS — I447 Left bundle-branch block, unspecified: Secondary | ICD-10-CM | POA: Diagnosis not present

## 2022-11-11 LAB — CBC
HCT: 33.7 % — ABNORMAL LOW (ref 36.0–46.0)
Hemoglobin: 11.8 g/dL — ABNORMAL LOW (ref 12.0–15.0)
MCH: 30.5 pg (ref 26.0–34.0)
MCHC: 35 g/dL (ref 30.0–36.0)
MCV: 87.1 fL (ref 80.0–100.0)
Platelets: 333 10*3/uL (ref 150–400)
RBC: 3.87 MIL/uL (ref 3.87–5.11)
RDW: 13.7 % (ref 11.5–15.5)
WBC: 11.4 10*3/uL — ABNORMAL HIGH (ref 4.0–10.5)
nRBC: 0 % (ref 0.0–0.2)

## 2022-11-11 LAB — BASIC METABOLIC PANEL
Anion gap: 10 (ref 5–15)
BUN: 9 mg/dL (ref 8–23)
CO2: 21 mmol/L — ABNORMAL LOW (ref 22–32)
Calcium: 8.2 mg/dL — ABNORMAL LOW (ref 8.9–10.3)
Chloride: 98 mmol/L (ref 98–111)
Creatinine, Ser: 0.54 mg/dL (ref 0.44–1.00)
GFR, Estimated: >60 mL/min (ref >60)
Glucose, Bld: 155 mg/dL — ABNORMAL HIGH (ref 70–99)
Potassium: 3.5 mmol/L (ref 3.5–5.1)
Sodium: 129 mmol/L — ABNORMAL LOW (ref 135–145)

## 2022-11-11 MED ORDER — MIDAZOLAM HCL 2 MG/2ML IJ SOLN
INTRAMUSCULAR | Status: AC | PRN
  Administered 2022-11-11: 1 mg via INTRAVENOUS

## 2022-11-11 MED ORDER — FENTANYL CITRATE (PF) 100 MCG/2ML IJ SOLN
INTRAMUSCULAR | Status: AC | PRN
  Administered 2022-11-11: 25 ug via INTRAVENOUS

## 2022-11-11 MED ORDER — FENTANYL CITRATE (PF) 100 MCG/2ML IJ SOLN
INTRAMUSCULAR | Status: AC
  Filled 2022-11-11: qty 2

## 2022-11-11 MED ORDER — ACETAMINOPHEN 500 MG PO TABS
1000.0000 mg | ORAL_TABLET | Freq: Four times a day (QID) | ORAL | Status: DC
  Administered 2022-11-11 – 2022-11-18 (×26): 1000 mg via ORAL
  Filled 2022-11-11 (×28): qty 2

## 2022-11-11 MED ORDER — LIDOCAINE HCL 2 % IJ SOLN: 10.0000 mL | Freq: Once | INTRAMUSCULAR | Status: DC

## 2022-11-11 MED ORDER — LIDOCAINE 5 % EX PTCH
1.0000 | MEDICATED_PATCH | CUTANEOUS | Status: DC
  Administered 2022-11-13 – 2022-11-14 (×2): 1 via TRANSDERMAL
  Filled 2022-11-11 (×4): qty 1

## 2022-11-11 MED ORDER — MIDAZOLAM HCL 2 MG/2ML IJ SOLN
INTRAMUSCULAR | Status: AC
  Filled 2022-11-11: qty 2

## 2022-11-11 MED ORDER — OXYCODONE HCL 5 MG PO TABS
5.0000 mg | ORAL_TABLET | Freq: Four times a day (QID) | ORAL | Status: DC | PRN
  Administered 2022-11-11 – 2022-11-17 (×4): 5 mg via ORAL
  Filled 2022-11-11 (×4): qty 1

## 2022-11-11 NOTE — Progress Notes (Addendum)
Rounded on patient this evening after chest tube placement.  Patient was sitting up comfortably in bed upon arrival.  She says she is doing fine and her chest pain is mild due to her medication.  Patient did note that she has been coughing more since the chest tube placement. Cough is nonproductive and denies any headache, lightheadedness, worsening chest pain or dyspnea. Patient was sating in the mid 90s on RA during this encounter. She was in good spirit without any complain.  Alen Bleacher, MD PGY-2, [facility] Family Medicine Resident  Please page [phone] with questions.

## 2022-11-11 NOTE — Plan of Care (Signed)

## 2022-11-11 NOTE — ED Notes (Signed)
In route to IR

## 2022-11-11 NOTE — ED Notes (Addendum)
Coming back from IR per report pt was given 1 mg versed 25 mcg of fentanyl 12 F left lateral side chest tube placed 126/62 96%RA

## 2022-11-11 NOTE — ED Notes (Signed)
Patient ambulated to restroom.

## 2022-11-11 NOTE — ED Notes (Signed)
While patient resting, oxygen levels dropping to 87-88% RA. Patient placed on oxygen [place] 2L. No signs of respiratory distress at this time.

## 2022-11-11 NOTE — ED Notes (Signed)
Patient denies pain and is resting comfortably.  

## 2022-11-11 NOTE — Progress Notes (Addendum)
Daily Progress Note Intern Pager: [phone]  Patient name: Dana Bryan Medical record number: [id number] Date of birth: 05/16/1951 Age: 71 y.o. Gender: female  Primary Care Provider: Midge Minium, MD Consultants: Pulmonology Code Status: FULL  Pt Overview and Major Events to Date:  11/21 - admitted  Assessment and Plan: Dana Bryan is a 71 year old female with past medical history of HTN and HLD admitted for left sided CAP with parapneumonic effusion and pleuritic chest pain.  Community acquired pneumonia with pleural effusion Oxygen levels dropped to 87-88% on room air overnight and she was placed on 2L [place]. Her pleuritic chest pain worsened overnight and tylenol was not enough, so lidocaine patch and oxycodone was given along with tylenol to help moderate her symptoms. She no longer wants the oxycodone and didn't use the lidocaine patch. Her oxygen saturation has improved, the drop was likely due to her pain last night. Pulmonology and CT surgery evaluated and recommended loculated fluid collection will likely resolve with drainage, IV antibiotics, aggressive pulmonary hygiene, and possible lytic therapy. She is currently on broad-spectrum antibiotics and a sputum culture has been ordered. PT/OT cancelled eval and treat. On exam, her breath sounds are still diminished and crackles still present over the LLL. Remains euvolemic on examination. - NPO for IR procedure - Daily CBC/BMP - Continuous cardiac monitoring - S/p azithromycin + Rocephin > de-escalate to oral when able - Sputum and blood culture pending x2 - Strict I&Os - Schedule Tylenol q6hr   Electrolyte abnormality Hyponatremia remains 129, corrected for hyperglycemia is 130; K 3.4>repleted. Seems related to hypovolemic vs euvolemic hyponatremia.  - Consider IVF for hyponatremia  Aortic stenosis ECHO 08/2022 with left ventricular ejection fraction, by estimation, is 60 to 65%. The left ventricle  has normal function. Moderate aortic stenosis is present. The aortic valve is bicuspid - Continuous cardiac monitoring - Potential cardiology follow up outpatient for Zio  - Monitor symptoms  Acid reflux Protonix started, see above for cardiac work up   Alcohol abuse, daily use Liquor daily. CIWAs initiated, monitor for symptoms. No signs of withdrawal on admission. - Discussed alcohol cessation - Discontinued CIWAs  Anxiety and depression Take Welbutrin and Buspar restarted upon admission. - Stable  Hyperlipidemia Last lipid panel LDL 65 09/21/22. Continue home Lipitor. - Stable   FEN/GI: NPO for procedure PPx: Lovenox Dispo: med-tele  Subjective: Overnight the patient had exacerbated pain with coughing, she asked for more pain medication and was given Oxycodone along with her Tylenol, she didn't want to use the lidocaine patch due to past history with it. Her oxygen saturation has improved from overnight when she was saturating in the upper 80s and was placed on 2 L [place], but was taken off oxygen this morning and is saturating at 97% this morning. She still has a persistent cough, phlegm production, and some pain which she thinks she can tolerate with tylenol.   Objective: Temp:  [97.5 F (36.4 C)-98.2 F (36.8 C)] 97.5 F (36.4 C) (11/22 1046) Pulse Rate:  [48-95] 48 (11/22 0845) Resp:  [17-30] 22 (11/22 0845) BP: (133-165)/(67-86) 135/69 (11/22 0845) SpO2:  [90 %-97 %] 95 % (11/22 0845) Physical Exam: General: Well appearing, pleasant female, laying comfortably with husband at bedside. NAD. Cardiovascular: RRR. Systolic aortic stenosis murmur.  Respiratory: LLL crackles and rhonchi, with diminished breath sounds.  Abdomen: Soft, nontender, nondistended. Normal bowel sounds. Extremities: Able to move all extremities with palpable DP pulses   Psych: normal mood  and affect.  Laboratory: Most recent CBC Lab Results  Component Value Date   WBC 11.4 (H) 11/11/2022   HGB  11.8 (L) 11/11/2022   HCT 33.7 (L) 11/11/2022   MCV 87.1 11/11/2022   PLT 333 11/11/2022   Most recent BMP    Latest Ref Rng & Units 11/11/2022    1:18 AM  BMP  Glucose 70 - 99 mg/dL 155   BUN 8 - 23 mg/dL 9   Creatinine 0.44 - 1.00 mg/dL 0.54   Sodium 135 - 145 mmol/L 129   Potassium 3.5 - 5.1 mmol/L 3.5   Chloride 98 - 111 mmol/L 98   CO2 22 - 32 mmol/L 21   Calcium 8.9 - 10.3 mg/dL 8.2     Other pertinent labs - none  Imaging/Diagnostic Tests: CT Chest w/ Contrast Radiologist Impression:  1. Small loculated left pleural effusion with associated left lower lobe consolidation, findings are concerning for infection. Recommend follow-up chest CT 3 months to ensure resolution. 2. Patchy ground-glass opacities of the right upper lobe, likely infectious or inflammatory. Recommend attention on follow-up. 3. Aortic Atherosclerosis  Britt Bolognese, Medical Student 11/11/2022, 12:18 PM  I was personally present and re-performed the exam. I verified the service and findings are accurately documented in the student's note.  Alen Bleacher, MD 11/11/2022 7:07 PM   [place] Intern pager: [phone], text pages welcome Secure chat group [place]

## 2022-11-11 NOTE — Procedures (Signed)
Pre procedural Dx: Left sided pleural effusion Post procedural Dx: Same  Technically successful CT guided placed of a 12 Fr drainage catheter placement into the left pleural splace yielding 400 cc of serous fluid.   A representative aspirated sample was capped and sent to the laboratory for analysis.   Chest tube connected to pleur-vac device.   EBL: Trace Complications: None immediate  Ronny Bacon, MD Pager #: [phone]

## 2022-11-11 NOTE — Consult Note (Signed)
Chief Complaint: Patient was seen in consultation today for left chest tube placement.  Referring Physician(s): PCCM/Family Medicine Teaching Service  Supervising Physician: Sandi Mariscal  Patient Status: [facility] - Out-pt  History of Present Illness: Dana Bryan is a 71 y.o. female with a past medical history significant for anxiety, depression, HLD, bicuspid aortic valve with moderate aortic stenosis, previous tobacco seen today for left chest tube placement. [name] developed a productive cough earlier this month and was seen by her PCP and was treated with antibiotics however the cough did not improve. She then presented to the ED after developing left sided chest pain. Initial exam in the ED noted leukocytosis (16.5), CXR showed a left lower lobe consolidation/possible pleural effusion. Follow up CT scan of the chest showed:  1. Small loculated left pleural effusion with associated left lower lobe consolidation, findings are concerning for infection. Recommend follow-up chest CT 3 months to ensure resolution. 2. Patchy ground-glass opacities of the right upper lobe, likely infectious or inflammatory. Recommend attention on follow-up.  She was started on IV antibiotics for CAP with improvement in her symptoms as well as leukocytosis. PCCM/CT surgery were consulted for management of the pleural effusion and recommendation as been made for CT guided chest tube placement in IR for drainage and possible lytic therapy.   Patient seen in the ED, she is feeling much better than when she arrived - she still has left sided chest pain with deep inspiration, minimal non productive cough, doesn't have much of an appetite but is improving. She understand the procedure and is agreeable to proceed.  Past Medical History:  Diagnosis Date   Anxiety    on meds   Basal cell carcinoma    Cancer (HCC)    Cataract    no sx    Generalized headaches    Heart murmur    per Dr.Tabori    Hyperlipidemia    on meds   Reactive depression (situational)    on meds   Squamous cell skin cancer, finger    Vasomotor rhinitis     Past Surgical History:  Procedure Laterality Date   BUBBLE STUDY  09/04/2022   Procedure: BUBBLE STUDY;  Surgeon: Geralynn Rile, MD;  Location: [place];  Service: Cardiovascular;;   COLONOSCOPY  2008   normal    TEE WITHOUT CARDIOVERSION N/A 09/04/2022   Procedure: TRANSESOPHAGEAL ECHOCARDIOGRAM (TEE);  Surgeon: Geralynn Rile, MD;  Location: [place];  Service: Cardiovascular;  Laterality: N/A;   TONSILLECTOMY  1973    Allergies: Chlorhexidine gluconate  Medications: Prior to Admission medications   Medication Sig Start Date End Date Taking? Authorizing Provider  ALPRAZolam (XANAX) 0.25 MG tablet Take 1 tablet (0.25 mg total) by mouth daily as needed. Patient taking differently: Take 0.25 mg by mouth daily as needed for anxiety. 06/11/22  Yes Midge Minium, MD  Ascorbic Acid (VITAMIN C PO) Take 500 mg by mouth daily.   Yes [provider]  atorvastatin (LIPITOR) 20 MG tablet TAKE 1 TABLET BY MOUTH EVERY DAY Patient taking differently: Take 20 mg by mouth daily. 08/25/22  Yes Midge Minium, MD  buPROPion (WELLBUTRIN XL) 300 MG 24 hr tablet Take 1 tablet (300 mg total) by mouth daily. 06/01/22  Yes Midge Minium, MD  busPIRone (BUSPAR) 7.5 MG tablet TAKE 1 TABLET BY MOUTH TWICE A DAY Patient taking differently: Take 7.5 mg by mouth 2 (two) times daily. 10/08/22  Yes Midge Minium, MD  Cholecalciferol (VITAMIN  D3 PO) Take 2,000 Units by mouth.   Yes [provider]  clobetasol cream (TEMOVATE) 3.53 % Apply 1 application  topically 2 (two) times daily as needed (irritation).   Yes [provider]  cyanocobalamin (VITAMIN B12) 1000 MCG tablet Take 1,000 mcg by mouth daily.   Yes [provider]  doxycycline (VIBRA-TABS) 100 MG tablet Take 1 tablet (100 mg total) by mouth 2  (two) times daily. 11/09/22  Yes Midge Minium, MD  ibuprofen (ADVIL,MOTRIN) 200 MG tablet Take 400 mg by mouth daily as needed for headache.   Yes [provider]  promethazine-dextromethorphan (PROMETHAZINE-DM) 6.25-15 MG/5ML syrup Take 5 mLs by mouth 4 (four) times daily as needed. Patient taking differently: Take 5 mLs by mouth 4 (four) times daily as needed for cough. 11/06/22  Yes Midge Minium, MD  zinc gluconate 50 MG tablet Take 50 mg by mouth daily.   Yes [provider]  albuterol (VENTOLIN HFA) 108 (90 Base) MCG/ACT inhaler Inhale 2 puffs into the lungs every 6 (six) hours as needed for wheezing or shortness of breath. 11/06/22   Midge Minium, MD     Family History  Problem Relation Age of Onset   Heart attack Mother    Hypertension Mother    Hypertension Father    Alcohol abuse Father    Colon polyps Neg Hx    Colon cancer Neg Hx    Esophageal cancer Neg Hx    Stomach cancer Neg Hx    Rectal cancer Neg Hx     Social History   Socioeconomic History   Marital status: Married    Spouse name: Not on file   Number of children: 2   Years of education: Not on file   Highest education level: Not on file  Occupational History   Occupation: Retired Science writer  Tobacco Use   Smoking status: Former    Years: 30.00    Types: Cigarettes    Quit date: 01/22/2004    Years since quitting: 18.8   Smokeless tobacco: Never  Vaping Use   Vaping Use: Never used  Substance and Sexual Activity   Alcohol use: Yes    Alcohol/week: 15.0 standard drinks of alcohol    Types: 15 Standard drinks or equivalent per week    Comment: drinks 1-2 drinks daily   Drug use: Never   Sexual activity: Not on file  Other Topics Concern   Not on file  Social History Narrative   HSG, college Grad. Married '71. 2 Daughters- '77, '81. 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, occupational)   Social  Determinants of [place] Strain: [place]  (07/23/2022)   Overall Financial Resource Strain (CARDIA)    Difficulty of Paying Living Expenses: Not hard at all  Food Insecurity: No [place] (07/23/2022)   Hunger Vital Sign    Worried About Running Out of Food in the Last Year: Never true    [place] in the Last Year: Never true  Transportation Needs: No Transportation Needs (07/23/2022)   PRAPARE - Hydrologist (Medical): No    Lack of Transportation (Non-Medical): No  Physical Activity: Insufficiently Active (07/23/2022)   Exercise Vital Sign    Days of Exercise per Week: 3 days    Minutes of Exercise per Session: 30 min  Stress: No Stress Concern Present (07/23/2022)  [employer] - Occupational Stress Questionnaire    Feeling of Stress : Not at all  Social Connections: Moderately Isolated (07/23/2022)   Social Connection and Isolation Panel [NHANES]    Frequency of Communication with Friends and Family: Three times a week    Frequency of Social Gatherings with Friends and Family: Three times a week    Attends Religious Services: Never    Active Member of Clubs or Organizations: No    Attends Archivist Meetings: Never    Marital Status: Married     Review of Systems: A 12 point ROS discussed and pertinent positives are indicated in the HPI above.  All other systems are negative.  Review of Systems  Constitutional:  Positive for appetite change (decreased). Negative for chills and fever.  Respiratory:  Positive for cough. Negative for shortness of breath.   Cardiovascular:  Positive for chest pain (left sided, with deep breaths only).  Gastrointestinal:  Negative for abdominal pain, nausea and vomiting.  Musculoskeletal:  Negative for back pain.  Neurological:  Negative for dizziness and headaches.    Vital Signs: BP 135/69   Pulse (!) 48   Temp (!) 97.5 F (36.4 C)   Resp (!) 22    SpO2 95%   Physical Exam Vitals and nursing note reviewed.  Constitutional:      General: She is not in acute distress. HENT:     Head: Normocephalic.     Mouth/Throat:     Mouth: Mucous membranes are moist.     Pharynx: Oropharynx is clear. No oropharyngeal exudate or posterior oropharyngeal erythema.  Cardiovascular:     Rate and Rhythm: Normal rate and regular rhythm.  Pulmonary:     Effort: Pulmonary effort is normal.     Comments: Diminished breath sounds on the left Abdominal:     General: There is no distension.     Palpations: Abdomen is soft.     Tenderness: There is no abdominal tenderness.  Skin:    General: Skin is warm and dry.  Neurological:     Mental Status: She is alert and oriented to person, place, and time.  Psychiatric:        Mood and Affect: Mood normal.        Behavior: Behavior normal.        Thought Content: Thought content normal.        Judgment: Judgment normal.      MD Evaluation Airway: WNL Heart: WNL Abdomen: WNL Chest/ Lungs: WNL ASA  Classification: 2 Mallampati/Airway Score: Two   Imaging: CT CHEST W CONTRAST  Result Date: 11/10/2022 CLINICAL DATA:  Pleural effusion EXAM: CT CHEST WITH CONTRAST TECHNIQUE: Multidetector CT imaging of the chest was performed during intravenous contrast administration. RADIATION DOSE REDUCTION: This exam was performed according to the departmental dose-optimization program which includes automated exposure control, adjustment of the mA and/or kV according to patient size and/or use of iterative reconstruction technique. CONTRAST:  17m OMNIPAQUE IOHEXOL 350 MG/ML SOLN COMPARISON:  None Available. FINDINGS: Cardiovascular: Normal heart size. No pericardial effusion. Normal caliber thoracic aorta with mild atherosclerotic disease. Mild coronary artery calcifications. Mediastinum/Nodes: Small hiatal hernia. Thyroid is unremarkable. No pathologically enlarged lymph nodes seen in the chest. Lungs/Pleura:  Central airways are patent. Biapical pleural-parenchymal scarring. Patchy ground-glass opacities of the right upper lobe. Small loculated left pleural effusion with associated atelectasis and left lower lobe consolidation, and ground-glass opacities. No evidence of pleural thickening. Upper Abdomen: Small low-attenuation left renal lesions  which are too small to completely characterize but likely simple cysts, no specific follow-up imaging is recommended. No acute abnormality. Musculoskeletal: No chest wall abnormality. No acute or significant osseous findings. IMPRESSION: 1. Small loculated left pleural effusion with associated left lower lobe consolidation, findings are concerning for infection. Recommend follow-up chest CT 3 months to ensure resolution. 2. Patchy ground-glass opacities of the right upper lobe, likely infectious or inflammatory. Recommend attention on follow-up. 3. Aortic Atherosclerosis (ICD10-I70.0). Electronically Signed   By: Yetta Glassman M.D.   On: 11/10/2022 11:34   DG Chest 2 View  Result Date: 11/09/2022 CLINICAL DATA:  Pneumonia. Worsening pain and shortness of breath EXAM: CHEST - 2 VIEW COMPARISON:  Radiograph 11/06/2022 FINDINGS: Worsening left lower lobe opacity with development of pleural effusion. No focal airspace disease in the right lung. Normal heart size with stable mediastinal contours. There is mild biapical pleuroparenchymal scarring IMPRESSION: Worsening left lower lobe opacity with development of pleural effusion. Recommend radiographic follow-up to resolution. Electronically Signed   By: Keith Rake M.D.   On: 11/09/2022 23:37   DG Chest 2 View  Result Date: 11/08/2022 CLINICAL DATA:  Productive cough with some lt side chest pain, AND body aches x 10 days, x-smoker. EXAM: CHEST - 2 VIEW COMPARISON:  CT 07/10/2022 FINDINGS: Focal airspace consolidation in the anterior basal segment left lower lobe, with patchy extension up towards the hilum. Right lung  clear. Heart size and mediastinal contours are within normal limits. No effusion. Visualized bones unremarkable. IMPRESSION: Left lower lobe pneumonia. Electronically Signed   By: Lucrezia Europe M.D.   On: 11/08/2022 10:58   DG Bone Density  Result Date: 10/21/2022 Table formatting from the original result was not included. Date of study: 10/20/2022 Exam: DUAL X-RAY ABSORPTIOMETRY (DXA) FOR BONE MINERAL DENSITY (BMD) Instrument: [employer] Requesting Provider: PCP Indication: follow up for low BMD Comparison: 02/13/2020 Clinical data: Pt is a 71 y.o. female with previous history of foot fracture. On calcium and vitamin D. Results:  Lumbar spine L1-L4 Femoral neck (FN) 33% distal radius T-score -0.9 RFN: -2.4 LFN: -2.2 n/a Change in BMD from previous DXA test (%) n/a -3.2% n/a (*) statistically significant Assessment: the BMD is low according to the [facility] classification for osteoporosis (see below). Fracture risk: moderate FRAX score: 10 year major osteoporotic risk: 21.2%. 10 year hip fracture risk: 5%. The thresholds for treatment are 20% and 3%, respectively. Comments: the technical quality of the study is good. Evaluation for secondary causes should be considered if clinically indicated. Recommend optimizing calcium (1200 mg/day) and vitamin D (800 IU/day) intake. Followup: Repeat BMD is appropriate after 2 years or after 1-2 years if starting treatment. WHO criteria for diagnosis of osteoporosis in postmenopausal women and in men 89 y/o or older: - normal: T-score -1.0 to + 1.0 - osteopenia/low bone density: T-score between -2.5 and -1.0 - osteoporosis: T-score below -2.5 - severe osteoporosis: T-score below -2.5 with history of fragility fracture Note: although not part of the WHO classification, the presence of a fragility fracture, regardless of the T-score, should be considered diagnostic of osteoporosis, provided other causes for the fracture have been excluded. Treatment: The National Osteoporosis  Foundation recommends that treatment be considered in postmenopausal women and men age 34 or older with: 1. Hip or vertebral (clinical or morphometric) fracture 2. T-score of - 2.5 or lower at the spine or hip 3. 10-year fracture probability by FRAX of at least 20% for a major osteoporotic fracture and 3% for  a hip fracture Philemon Kingdom, MD [facility]    Labs:  CBC: Recent Labs    06/11/22 1126 08/28/22 0946 11/09/22 2311 11/11/22 0118  WBC 7.3 4.8 16.5* 11.4*  HGB 13.4 12.9 12.2 11.8*  HCT 40.4 37.4 36.2 33.7*  PLT 214.0 233 274 333    COAGS: No results for input(s): "INR", "APTT" in the last 8760 hours.  BMP: Recent Labs    08/28/22 0946 11/09/22 2311 11/10/22 1632 11/11/22 0118  NA 140 129* 129* 129*  K 4.7 3.4* 3.6 3.5  CL 103 93* 99 98  CO2 24 21* 19* 21*  GLUCOSE 87 149* 102* 155*  BUN '12 15 10 9  '$ CALCIUM 9.6 8.6* 7.8* 8.2*  CREATININE 0.74 0.63 0.62 0.54  GFRNONAA  --  >60 >60 >60    LIVER FUNCTION TESTS: Recent Labs    03/10/22 0922 06/11/22 1126 09/21/22 1106  BILITOT 0.5 0.5 0.4  AST '15 13 14  '$ ALT '15 15 15  '$ ALKPHOS 111 113 107  PROT 6.9 7.3 7.2  ALBUMIN 4.4 4.3 4.3    TUMOR MARKERS: No results for input(s): "AFPTM", "CEA", "CA199", "CHROMGRNA" in the last 8760 hours.  Assessment and Plan:  71 y/o F admitted with CAP and new left loculated pleural effusion seen today for CT guided chest tube placement in IR for drainage and possible lytic therapy per PCCM/CT surgery discretion.   Patient history and imaging reviewed by Dr. Pascal Lux who approves procedure. Patient has been NPO since midnight, no anticoagulation currently.  Risks and benefits discussed with the patient including bleeding, infection, damage to adjacent structures, pneumothorax and sepsis.  All of the patient's questions were answered, patient is agreeable to proceed.  Consent signed and in chart.  Thank you for this interesting consult.  I greatly enjoyed meeting  Adelynne Joerger and look forward to participating in their care.  A copy of this report was sent to the requesting provider on this date.  Electronically Signed: Joaquim Nam, PA-C 11/11/2022, 12:00 PM   I spent a total of 40 Minutes in face to face in clinical consultation, greater than 50% of which was counseling/coordinating care for left chest tube placement.

## 2022-11-11 NOTE — Consult Note (Addendum)
[address]       [place] [address]             [phone]        Dana Bryan Medical Record #[id number] Date of Birth: 04/27/1951  Referring: Donney Dice, DO  Primary Care: Midge Minium, MD Primary Cardiologist:[place] Doreatha Lew, MD  Reason for Consult: Evaluation of left loculated parapneumonic pleural effusion  History of Present Illness:     Dana Bryan is a 71 year old female with past history of tobacco use for about 30 years having quit 18 years ago.  She has a known bicuspid aortic valve with moderate aortic stenosis followed by Dr. Eleonore Chiquito.  She also has a history of anxiety and depression and daily alcohol use.  She was seen by her primary care physician on 11/06/2022 with a primary complaint of persistent productive cough for least 2 weeks duration.  She was treated symptomatically.  She then presented to the emergency room on the 23 after developing worsening pleuritic chest pain on the left side.  An EKG showed left bundle branch block which was similar to previous tracings.  White blood count was elevated at 16,500.  The chest x-ray showed left lower lobe consolidation with possible early development of a pleural effusion.  This was further evaluated with a CT scan of the chest that confirmed a complex left loculated parapneumonic effusion.  She was started on broad-spectrum IV antibiotic coverage with azithromycin and ceftriaxone.  Within 24 hours, her white blood count had decreased to 11,000 and she was feeling better in general.  She remains in the emergency room awaiting a bed on the floor following admission by the medicine service.  The pulmonary medicine team has been consulted as a and have recommended placement of a pigtail catheter into the left parapneumonic effusion by the interventional radiology team.  CT surgery has been consulted to evaluate Dana Bryan prior to proceeding with oral  drainage.  Currently, Dana Bryan is resting comfortably in the emergency room with her husband at the bedside.  She continues to have a loose cough and significant left-sided chest discomfort.  She is oxygenating well on room air but she tells me that she was doing significant shortness of breath prior to the onset of cough a few weeks back.  She is concerned that this may be related to the aortic stenosis.  She remains afebrile.      Current Activity/ Functional Status: Patient is independent with mobility/ambulation, transfers, ADL's, IADL's.   Zubrod Score: At the time of surgery this patient's most appropriate activity status/level should be described as: '[]'$     0    Normal activity, no symptoms '[x]'$     1    Restricted in physical strenuous activity but ambulatory, able to do out light work '[]'$     2    Ambulatory and capable of self care, unable to do work activities, up and about                 more than 50%  Of the time                            '[]'$     3    Only limited self care, in bed greater than 50% of waking hours '[]'$     4    Completely disabled, no self care, confined to bed or chair '[]'$   5    Moribund  Past Medical History:  Diagnosis Date   Anxiety    on meds   Basal cell carcinoma    Cancer (HCC)    Cataract    no sx    Generalized headaches    Heart murmur    per Dr.Tabori   Hyperlipidemia    on meds   Reactive depression (situational)    on meds   Squamous cell skin cancer, finger    Vasomotor rhinitis     Past Surgical History:  Procedure Laterality Date   BUBBLE STUDY  09/04/2022   Procedure: BUBBLE STUDY;  Surgeon: Geralynn Rile, MD;  Location: [facility];  Service: Cardiovascular;;   COLONOSCOPY  2008   normal    TEE WITHOUT CARDIOVERSION N/A 09/04/2022   Procedure: TRANSESOPHAGEAL ECHOCARDIOGRAM (TEE);  Surgeon: Geralynn Rile, MD;  Location: [facility] ENDOSCOPY;  Service: Cardiovascular;  Laterality: N/A;   TONSILLECTOMY  1973    Social  History   Tobacco Use  Smoking Status Former   Years: 30.00   Types: Cigarettes   Quit date: 01/22/2004   Years since quitting: 18.8  Smokeless Tobacco Never    Social History   Substance and Sexual Activity  Alcohol Use Yes   Alcohol/week: 15.0 standard drinks of alcohol   Types: 15 Standard drinks or equivalent per week   Comment: drinks 1-2 drinks daily     Allergies  Allergen Reactions   Chlorhexidine Gluconate Rash    Current Facility-Administered Medications  Medication Dose Route Frequency Provider Last Rate Last Admin   acetaminophen (TYLENOL) tablet 1,000 mg  1,000 mg Oral Q6H PRN Arlyce Dice, MD   1,000 mg at 11/11/22 0430   albuterol (PROVENTIL) (2.5 MG/3ML) 0.083% nebulizer solution 2.5 mg  2.5 mg Nebulization Q4H PRN Martyn Malay, MD       atorvastatin (LIPITOR) tablet 20 mg  20 mg Oral Daily Martyn Malay, MD   20 mg at 11/11/22 0847   azithromycin (ZITHROMAX) 500 mg in sodium chloride 0.9 % 250 mL IVPB  500 mg Intravenous Q24H Martyn Malay, MD       buPROPion (WELLBUTRIN XL) 24 hr tablet 300 mg  300 mg Oral Daily Dorris Singh M, MD   300 mg at 11/10/22 1418   busPIRone (BUSPAR) tablet 7.5 mg  7.5 mg Oral BID Martyn Malay, MD   7.5 mg at 11/10/22 2116   calcium carbonate (TUMS - dosed in mg elemental calcium) chewable tablet 200 mg of elemental calcium  1 tablet Oral BID Martyn Malay, MD   200 mg of elemental calcium at 11/10/22 2117   cefTRIAXone (ROCEPHIN) 1 g in sodium chloride 0.9 % 100 mL IVPB  1 g Intravenous Q24H Martyn Malay, MD 200 mL/hr at 11/11/22 0843 1 g at 11/11/22 0843   enoxaparin (LOVENOX) injection 40 mg  40 mg Subcutaneous Q24H Maxwell, Allee, MD       lidocaine (LIDODERM) 5 % 1 patch  1 patch Transdermal Q24H Ezequiel Essex, MD       oxyCODONE (Oxy IR/ROXICODONE) immediate release tablet 5 mg  5 mg Oral Q6H PRN Ezequiel Essex, MD   5 mg at 11/11/22 0134   pantoprazole (PROTONIX) EC tablet 40 mg  40 mg Oral Daily Martyn Malay, MD   40 mg at 11/11/22 6962   Current Outpatient Medications  Medication Sig Dispense Refill   ALPRAZolam (XANAX) 0.25 MG tablet Take 1 tablet (0.25 mg total) by mouth  daily as needed. (Patient taking differently: Take 0.25 mg by mouth daily as needed for anxiety.) 30 tablet 0   Ascorbic Acid (VITAMIN C PO) Take 500 mg by mouth daily.     atorvastatin (LIPITOR) 20 MG tablet TAKE 1 TABLET BY MOUTH EVERY DAY (Patient taking differently: Take 20 mg by mouth daily.) 90 tablet 0   buPROPion (WELLBUTRIN XL) 300 MG 24 hr tablet Take 1 tablet (300 mg total) by mouth daily. 90 tablet 1   busPIRone (BUSPAR) 7.5 MG tablet TAKE 1 TABLET BY MOUTH TWICE A DAY (Patient taking differently: Take 7.5 mg by mouth 2 (two) times daily.) 180 tablet 0   Cholecalciferol (VITAMIN D3 PO) Take 2,000 Units by mouth.     clobetasol cream (TEMOVATE) 7.94 % Apply 1 application  topically 2 (two) times daily as needed (irritation).     cyanocobalamin (VITAMIN B12) 1000 MCG tablet Take 1,000 mcg by mouth daily.     doxycycline (VIBRA-TABS) 100 MG tablet Take 1 tablet (100 mg total) by mouth 2 (two) times daily. 20 tablet 0   ibuprofen (ADVIL,MOTRIN) 200 MG tablet Take 400 mg by mouth daily as needed for headache.     promethazine-dextromethorphan (PROMETHAZINE-DM) 6.25-15 MG/5ML syrup Take 5 mLs by mouth 4 (four) times daily as needed. (Patient taking differently: Take 5 mLs by mouth 4 (four) times daily as needed for cough.) 240 mL 0   zinc gluconate 50 MG tablet Take 50 mg by mouth daily.     albuterol (VENTOLIN HFA) 108 (90 Base) MCG/ACT inhaler Inhale 2 puffs into the lungs every 6 (six) hours as needed for wheezing or shortness of breath. 8 g 0    (Not in a hospital admission)   Family History  Problem Relation Age of Onset   Heart attack Mother    Hypertension Mother    Hypertension Father    Alcohol abuse Father    Colon polyps Neg Hx    Colon cancer Neg Hx    Esophageal cancer Neg Hx    Stomach cancer Neg  Hx    Rectal cancer Neg Hx      Review of Systems:     Cardiac Review of Systems: Y or  [    ]= no  Chest Pain [  yes, left side  ]  Resting SOB [   ] Exertional SOB  [ x ]  Orthopnea [  ]   Pedal Edema [   ]    Palpitations [  ] Syncope  [  ]   Presyncope [   ]  General Review of Systems: [Y] = yes [  ]=no Constitional: recent weight change [  ]; anorexia [  ]; fatigue [  ]; nausea [  ]; night sweats [  ]; fever [  ]; or chills [ x ]                                                                 Eye : blurred vision [  ]; diplopia [   ]; vision changes [  ];  Amaurosis fugax[  ]; Resp: cough [ x ];  wheezing[  ];  hemoptysis[  ]; shortness of breath[ x ]; paroxysmal nocturnal dyspnea[  ]; dyspnea on exertion[  ]; or  orthopnea[  ];  GI:  gallstones[  ], vomiting[  ];  dysphagia[  ]; melena[  ];  hematochezia [  ]; heartburn[  ];   Hx of  Colonoscopy[  ]; GU: kidney stones [  ]; hematuria[  ];   dysuria [  ];  nocturia[  ];  history of     obstruction [  ]; urinary frequency [  ]             Skin: rash, swelling[  ];, hair loss[  ];  peripheral edema[  ];  or itching[  ]; Musculosketetal: myalgias[  ];  joint swelling[  ];  joint erythema[  ];  joint pain[  ];  back pain[  ];  Heme/Lymph: bruising[  ];  bleeding[  ];  anemia[  ];  Neuro: TIA[  ];  headaches[  ];  stroke[  ];  vertigo[  ];  seizures[  ];   paresthesias[  ];  difficulty walking[  ];  Psych:depression[ x ]; anxiety[x  ];  Endocrine: diabetes[  ];  thyroid dysfunction[  ];                Physical Exam: BP 135/69   Pulse (!) 48   Temp 97.9 F (36.6 C) (Oral)   Resp (!) 22   SpO2 95%    General appearance: alert and mild distress Head: Normocephalic, without obvious abnormality, atraumatic Neck: no adenopathy, no carotid bruit, no JVD, and supple, symmetrical, trachea midline Lymph nodes: No cervical or clavicular adenopathy Resp: Breath sounds are clear on the right and diminished in the left base Cardio: Regular  rate and rhythm, harsh grade 4/6 systolic murmur GI: Soft and nontender Extremities: No obvious deformities.  All distal pulses are easily palpable.  No peripheral edema. Neurologic: Grossly normal  Diagnostic Studies & Laboratory data:     Recent Radiology Findings:   CT CHEST W CONTRAST  Result Date: 11/10/2022 CLINICAL DATA:  Pleural effusion EXAM: CT CHEST WITH CONTRAST TECHNIQUE: Multidetector CT imaging of the chest was performed during intravenous contrast administration. RADIATION DOSE REDUCTION: This exam was performed according to the departmental dose-optimization program which includes automated exposure control, adjustment of the mA and/or kV according to patient size and/or use of iterative reconstruction technique. CONTRAST:  15m OMNIPAQUE IOHEXOL 350 MG/ML SOLN COMPARISON:  None Available. FINDINGS: Cardiovascular: Normal heart size. No pericardial effusion. Normal caliber thoracic aorta with mild atherosclerotic disease. Mild coronary artery calcifications. Mediastinum/Nodes: Small hiatal hernia. Thyroid is unremarkable. No pathologically enlarged lymph nodes seen in the chest. Lungs/Pleura: Central airways are patent. Biapical pleural-parenchymal scarring. Patchy ground-glass opacities of the right upper lobe. Small loculated left pleural effusion with associated atelectasis and left lower lobe consolidation, and ground-glass opacities. No evidence of pleural thickening. Upper Abdomen: Small low-attenuation left renal lesions which are too small to completely characterize but likely simple cysts, no specific follow-up imaging is recommended. No acute abnormality. Musculoskeletal: No chest wall abnormality. No acute or significant osseous findings. IMPRESSION: 1. Small loculated left pleural effusion with associated left lower lobe consolidation, findings are concerning for infection. Recommend follow-up chest CT 3 months to ensure resolution. 2. Patchy ground-glass opacities of the  right upper lobe, likely infectious or inflammatory. Recommend attention on follow-up. 3. Aortic Atherosclerosis (ICD10-I70.0). Electronically Signed   By: LYetta GlassmanM.D.   On: 11/10/2022 11:34   DG Chest 2 View  Result Date: 11/09/2022 CLINICAL DATA:  Pneumonia. Worsening pain and shortness of breath EXAM: CHEST - 2 VIEW  COMPARISON:  Radiograph 11/06/2022 FINDINGS: Worsening left lower lobe opacity with development of pleural effusion. No focal airspace disease in the right lung. Normal heart size with stable mediastinal contours. There is mild biapical pleuroparenchymal scarring IMPRESSION: Worsening left lower lobe opacity with development of pleural effusion. Recommend radiographic follow-up to resolution. Electronically Signed   By: Keith Rake M.D.   On: 11/09/2022 23:37     I have independently reviewed the above radiologic studies and discussed with the patient   Recent Lab Findings: Lab Results  Component Value Date   WBC 11.4 (H) 11/11/2022   HGB 11.8 (L) 11/11/2022   HCT 33.7 (L) 11/11/2022   PLT 333 11/11/2022   GLUCOSE 155 (H) 11/11/2022   CHOL 161 09/21/2022   TRIG 45.0 09/21/2022   HDL 86.90 09/21/2022   LDLDIRECT 168.5 07/30/2011   LDLCALC 65 09/21/2022   ALT 15 09/21/2022   AST 14 09/21/2022   NA 129 (L) 11/11/2022   K 3.5 11/11/2022   CL 98 11/11/2022   CREATININE 0.54 11/11/2022   BUN 9 11/11/2022   CO2 21 (L) 11/11/2022   TSH 3.94 09/21/2022   INR 1.0 03/21/2019   HGBA1C 5.3 03/22/2019      Assessment / Plan:     -71 year old female with apparent community-acquired pneumonia presents with loculated left parapneumonic pleural effusion and persistent cough.  Dr. Prescott Gum has reviewed the CT scan and concurs with the recommendations of pulmonary medicine to proceed with image-guided drainage of the pleural space by interventional radiology.  This loculated fluid collection will likely resolve with drainage, IV antibiotics, aggressive pulmonary  hygiene, and possible lytic therapy.  She is currently on broad-spectrum antibiotics (azithromycin and ceftriaxone) and a sputum culture has been ordered but not yet submitted.  CT surgery will continue to follow progress and will be available should surgical intervention be required.  -Bicuspid aortic valve (Sever's type I) with moderate aortic stenosis, no AI, and preserved LV function.  It sounds as though she was having some shortness of breath and exercise intolerance prior to the onset of the pneumonia.  This is being followed by Dr. Eleonore Chiquito.   I  spent 25 minutes counseling the patient face to face.   Antony Odea, PA-C  11/11/2022 10:01 AM  Patient examined, images of CT scan of chest as well as in the last TEE 8 weeks ago to assess aortic stenosis personally reviewed.  I agree with the above assessment outlined by Enid Cutter, PA-C.  The patient has had 2 weeks of pulmonary symptoms malaise and increasing chest wall pain from a cough both dry and nonproductive.  Patient was admitted through the ED and has been placed on IV antibiotics for community-acquired pneumonia with a parapneumonic effusion.  IR has placed a pigtail catheter at the left lower pleural space and drained 400 cc of clear xanthochromic fluid which has been sent off for culture.  Patient should not need surgical intervention/VATS to treat this process but should be given an extended course of IV antibiotics.  The pneumonia and effusion are not related to her known moderate aortic stenosis from a bicuspid aortic valve.  Dahlia Byes MD

## 2022-11-11 NOTE — Progress Notes (Signed)
FMTS Brief Progress Note  S: Patient was seen in bed this evening.  She reports feeling chest pain.  She feels like the chest tube site feels like a wound.  She feels that the pain medications on board have been helpful with controlling her symptoms, specifically Motrin.   O: BP 132/86 (BP Location: Right Arm)   Pulse 96   Temp 98 F (36.7 C) (Oral)   Resp 18   Ht '5\' 3"'$  (1.6 m)   Wt 61.4 kg   SpO2 96%   BMI 23.98 kg/m    General: No acute distress.  Well-appearing, elderly female. Respiratory: Diminished lung sounds in the left lower lobe, chest tube shows serous fluid with a light yellow tinge.  Normal effort.  A/P: Community acquired pneumonia with pleural effusion -Encourage incentive spirometry -Flutter valve -Start Motrin for pain control.   -Current pain regimen: Tylenol 1000 mg every 6 hours, lidocaine patch, oxycodone 5 mg every 6 hours as needed for breakthrough pain -Start guaifenesin    Alesia Morin, MD 11/11/2022, 9:57 PM PGY-1, [facility] Night Resident  Please page [phone] with questions.

## 2022-11-11 NOTE — ED Notes (Signed)
Patient uncomfortable. Reports minimal relief from Tylenol for pain on left side near rib cage. MD Jeani Hawking aware. New orders received.

## 2022-11-11 NOTE — Progress Notes (Addendum)
NAME:  Dana Bryan, MRN:  [id number], DOB:  [birth year], LOS: 0 ADMISSION DATE:  11/09/2022, CONSULTATION DATE:  11/21 REFERRING MD:  Larae Grooms , REASON FOR CONSULT:  Left pleural effusion   History of Present Illness:  Dana Bryan is an 71 y.o. F who presented to [facility] on 11/21 with left sided chest pain.  Patient with reports of 2 to 4 weeks of ongoing cough without fever or chills.  On 11/20 she developed pleuritic chest pain which began to worsen.  She eventually presented to the [facility] emergency department.  Troponin within normal limits.  CT chest notable for community-acquired pneumonia of the right upper lobe, left lower lobe loculated pleural effusion.  The patient was admitted to the family medicine teaching service.  PCCM was consulted for management of left pleural effusion.  Pertinent  Medical History  Bicuspid aortic valve, mood disorder, left bundle branch block, smoking history 20 pack years.  Significant Hospital Events: Including procedures, antibiotic start and stop dates in addition to other pertinent events   11/21 presented to [facility], CT chest, IR consult  Interim History / Subjective:  Plan is for interventional radiology to place chest tube  Objective   Blood pressure 135/69, pulse (!) 48, temperature 97.9 F (36.6 C), temperature source Oral, resp. rate (!) 22, SpO2 95 %.       No intake or output data in the 24 hours ending 11/11/22 0939 There were no vitals filed for this visit.  Examination: General: Awake and alert complaining left-sided chest pain that increases with deep breath HEENT: MM pink/moist no JVD is appreciated Neuro: Grossly intact CV: Heart sounds are regular PULM: Diminished breath sounds on the left At the time of my examination on room air GI: soft, bsx4 active  GU: Voids Extremities: warm/dry, negative edema  Skin: no rashes or lesions    Resolved Hospital Problem list     Assessment & Plan:  Small  loculated left pleural effusion seen on CT chest with contrast, suspect secondary to infection See [place] above. 20 pack year smoking history. Worked as Customer service manager. Currently retired.  11/11/2022 after consulting cardiovascular thoracic surgery plans for interventional radiology to place chest tube today 11/11/2022. Most likely will need lytics   CAP on RUL and LLL Continue antimicrobial therapy O2 for sats less than 88%  All other issues per primary- PCCM will continue to follow. Please call with questions.  Best Practice (right click and "Reselect all SmartList Selections" daily)   Per primary  Labs   CBC: Recent Labs  Lab 11/09/22 2311 11/11/22 0118  WBC 16.5* 11.4*  NEUTROABS 14.4*  --   HGB 12.2 11.8*  HCT 36.2 33.7*  MCV 91.0 87.1  PLT 274 517    Basic Metabolic Panel: Recent Labs  Lab 11/09/22 2311 11/10/22 1632 11/11/22 0118  NA 129* 129* 129*  K 3.4* 3.6 3.5  CL 93* 99 98  CO2 21* 19* 21*  GLUCOSE 149* 102* 155*  BUN '15 10 9  '$ CREATININE 0.63 0.62 0.54  CALCIUM 8.6* 7.8* 8.2*   GFR: Estimated Creatinine Clearance: 54.9 mL/min (by C-G formula based on SCr of 0.54 mg/dL). Recent Labs  Lab 11/09/22 2311 11/10/22 0716 11/11/22 0118  WBC 16.5*  --  11.4*  LATICACIDVEN  --  1.4  --     Liver Function Tests: No results for input(s): "AST", "ALT", "ALKPHOS", "BILITOT", "PROT", "ALBUMIN" in the last 168 hours. No results for input(s): "LIPASE", "AMYLASE" in the last 168 hours.  No results for input(s): "AMMONIA" in the last 168 hours.  ABG No results found for: "PHART", "PCO2ART", "PO2ART", "HCO3", "TCO2", "ACIDBASEDEF", "O2SAT"   Coagulation Profile: No results for input(s): "INR", "PROTIME" in the last 168 hours.  Cardiac Enzymes: No results for input(s): "CKTOTAL", "CKMB", "CKMBINDEX", "TROPONINI" in the last 168 hours.  HbA1C: Hgb A1c MFr Bld  Date/Time Value Ref Range Status  03/22/2019 05:33 AM 5.3 4.8 - 5.6 % Final    Comment:    (NOTE) Pre  diabetes:          5.7%-6.4% Diabetes:              >6.4% Glycemic control for   <7.0% adults with diabetes   12/09/2017 07:26 AM 5.7 4.6 - 6.5 % Final    Comment:    Glycemic Control Guidelines for People with Diabetes:Non Diabetic:  <6%Goal of Therapy: <7%Additional Action Suggested:  >8%     CBG: No results for input(s): "GLUCAP" in the last 168 hours.   Richardson Landry Minor ACNP Acute Care Nurse Practitioner [place] Please consult Amion 11/11/2022, 9:39 AM   Critical care attending attestation note: I agree with the Advanced Practitioner's note, impression, and recommendations as outlined. I have taken an independent interval history, reviewed the chart and examined the patient. The following reflects my medical decision making and independent critical care time   Synopsis of assessment and plan: 71 year old with community-acquired pneumonia and complex loculated left-sided pleural effusion, now status post chest tube placed by IR   Physical exam: General: Acute bleeding ill-appearing female, lying on the bed HEENT: [place]/AT, eyes anicteric.  moist mucus membranes Neuro: Alert, awake following commands Chest: Coarse breath sounds, no wheezes or rhonchi. Chest tube placed on left side Heart: Regular rate and rhythm, no murmurs or gallops Abdomen: Soft, nontender, nondistended, bowel sounds present Skin: No rash   Labs and images were reviewed  Assessment and plan: Community-acquired pneumonia Complex loculated left-sided pleural effusion with septations  Continue IV antibiotics Follow-up pleural fluid studies, likely it is exudative Patient will probably need TPA and dornase via chest tube but will wait until pleural fluid is out Continue chest tube care  PCCM will continue to follow along, please call with questions   Jacky Kindle, MD [facility] See Amion for pager If no response to pager, please call [phone] until  7pm After 7pm, Please call E-link [phone]  11/11/2022, 5:23 PM

## 2022-11-12 ENCOUNTER — Inpatient Hospital Stay (HOSPITAL_COMMUNITY): Payer: Medicare HMO

## 2022-11-12 DIAGNOSIS — J90 Pleural effusion, not elsewhere classified: Secondary | ICD-10-CM | POA: Diagnosis not present

## 2022-11-12 DIAGNOSIS — R0781 Pleurodynia: Secondary | ICD-10-CM | POA: Diagnosis not present

## 2022-11-12 DIAGNOSIS — J189 Pneumonia, unspecified organism: Secondary | ICD-10-CM | POA: Diagnosis not present

## 2022-11-12 LAB — CBC
HCT: 35.5 % — ABNORMAL LOW (ref 36.0–46.0)
Hemoglobin: 11.9 g/dL — ABNORMAL LOW (ref 12.0–15.0)
MCH: 30.2 pg (ref 26.0–34.0)
MCHC: 33.5 g/dL (ref 30.0–36.0)
MCV: 90.1 fL (ref 80.0–100.0)
Platelets: 400 10*3/uL (ref 150–400)
RBC: 3.94 MIL/uL (ref 3.87–5.11)
RDW: 14 % (ref 11.5–15.5)
WBC: 11 10*3/uL — ABNORMAL HIGH (ref 4.0–10.5)
nRBC: 0 % (ref 0.0–0.2)

## 2022-11-12 LAB — BASIC METABOLIC PANEL
Anion gap: 14 (ref 5–15)
BUN: 11 mg/dL (ref 8–23)
CO2: 21 mmol/L — ABNORMAL LOW (ref 22–32)
Calcium: 8.5 mg/dL — ABNORMAL LOW (ref 8.9–10.3)
Chloride: 98 mmol/L (ref 98–111)
Creatinine, Ser: 0.59 mg/dL (ref 0.44–1.00)
GFR, Estimated: >60 mL/min (ref >60)
Glucose, Bld: 124 mg/dL — ABNORMAL HIGH (ref 70–99)
Potassium: 3.2 mmol/L — ABNORMAL LOW (ref 3.5–5.1)
Sodium: 133 mmol/L — ABNORMAL LOW (ref 135–145)

## 2022-11-12 MED ORDER — POTASSIUM CHLORIDE 20 MEQ PO PACK
60.0000 meq | PACK | Freq: Once | ORAL | Status: AC
  Filled 2022-11-12: qty 3

## 2022-11-12 MED ORDER — STERILE WATER FOR INJECTION IJ SOLN
5.0000 mg | Freq: Once | RESPIRATORY_TRACT | Status: AC
  Administered 2022-11-12: 5 mg via INTRAPLEURAL
  Filled 2022-11-12: qty 5

## 2022-11-12 MED ORDER — SODIUM CHLORIDE 0.9% FLUSH
10.0000 mL | Freq: Three times a day (TID) | INTRAVENOUS | Status: DC
  Administered 2022-11-13: 10 mL via INTRAPLEURAL

## 2022-11-12 MED ORDER — SODIUM CHLORIDE 0.9% FLUSH
10.0000 mL | Freq: Three times a day (TID) | INTRAVENOUS | Status: DC
  Administered 2022-11-12 – 2022-11-16 (×9): 10 mL via INTRAPLEURAL

## 2022-11-12 MED ORDER — SODIUM CHLORIDE (PF) 0.9 % IJ SOLN
10.0000 mg | Freq: Once | INTRAMUSCULAR | Status: AC
  Administered 2022-11-12: 10 mg via INTRAPLEURAL
  Filled 2022-11-12: qty 10

## 2022-11-12 MED ORDER — POTASSIUM CHLORIDE CRYS ER 20 MEQ PO TBCR
60.0000 meq | EXTENDED_RELEASE_TABLET | Freq: Once | ORAL | Status: AC
  Administered 2022-11-12: 60 meq via ORAL
  Filled 2022-11-12: qty 3

## 2022-11-12 MED ORDER — KETOROLAC TROMETHAMINE 15 MG/ML IJ SOLN
15.0000 mg | Freq: Four times a day (QID) | INTRAMUSCULAR | Status: AC
  Administered 2022-11-12 – 2022-11-17 (×20): 15 mg via INTRAVENOUS
  Filled 2022-11-12 (×20): qty 1

## 2022-11-12 NOTE — Progress Notes (Signed)
NAME:  Dana Bryan, MRN:  [id number], DOB:  1951/04/26, LOS: 1 ADMISSION DATE:  11/09/2022, CONSULTATION DATE:  11/21 REFERRING MD:  Larae Grooms , REASON FOR CONSULT:  Left pleural effusion   History of Present Illness:  Dana Bryan is an 71 y.o. F who presented to [facility] on 11/21 with left sided chest pain.  Patient with reports of 2 to 4 weeks of ongoing cough without fever or chills.  On 11/20 she developed pleuritic chest pain which began to worsen.  She eventually presented to the [facility] emergency department.  Troponin within normal limits.  CT chest notable for community-acquired pneumonia of the right upper lobe, left lower lobe loculated pleural effusion.  The patient was admitted to the family medicine teaching service.  PCCM was consulted for management of left pleural effusion.  Pertinent  Medical History  Bicuspid aortic valve, mood disorder, left bundle branch block, smoking history 20 pack years.  Significant Hospital Events: Including procedures, antibiotic start and stop dates in addition to other pertinent events   11/21 presented to [facility], CT chest, IR consult  Interim History / Subjective:  Patient had chest tube placed by IR Total chest tube output was 500 cc in last 24 hours  Objective   Blood pressure 121/66, pulse (!) 54, temperature 98 F (36.7 C), temperature source Oral, resp. rate 20, height '5\' 3"'$  (1.6 m), weight 54.9 kg, SpO2 95 %.        Intake/Output Summary (Last 24 hours) at 11/12/2022 1528 Last data filed at 11/12/2022 0447 Gross per 24 hour  Intake 615.43 ml  Output 98 ml  Net 517.43 ml   Filed Weights   11/11/22 1600 11/12/22 0459  Weight: 61.4 kg 54.9 kg    Examination: General: Awake and alert complaining left-sided chest pain that increases with deep breath HEENT: MM pink/moist no JVD is appreciated Neuro: Alert, awake, following commands CV: Heart sounds are regular PULM: Diminished breath sounds on the left At  the time of my examination on room air GI: soft, bsx4 active  Skin: no rashes or lesions    Resolved Hospital Problem list     Assessment & Plan:  Loculated left sided complex pleural effusion with septations  Community-acquired pneumonia Pigtail chest tube was placed by IR yesterday Patient's chest tube output is 500 cc in last 24 hours Tube lytics were given today, chest tube was clamped for 1 hour We will repeat x-ray chest in the morning  PCCM will follow along  Best Practice (right click and "Reselect all SmartList Selections" daily)   Per primary  Labs   CBC: Recent Labs  Lab 11/09/22 2311 11/11/22 0118 11/12/22 0203  WBC 16.5* 11.4* 11.0*  NEUTROABS 14.4*  --   --   HGB 12.2 11.8* 11.9*  HCT 36.2 33.7* 35.5*  MCV 91.0 87.1 90.1  PLT 274 333 144    Basic Metabolic Panel: Recent Labs  Lab 11/09/22 2311 11/10/22 1632 11/11/22 0118 11/12/22 0203  NA 129* 129* 129* 133*  K 3.4* 3.6 3.5 3.2*  CL 93* 99 98 98  CO2 21* 19* 21* 21*  GLUCOSE 149* 102* 155* 124*  BUN '15 10 9 11  '$ CREATININE 0.63 0.62 0.54 0.59  CALCIUM 8.6* 7.8* 8.2* 8.5*   GFR: Estimated Creatinine Clearance: 54.9 mL/min (by C-G formula based on SCr of 0.59 mg/dL). Recent Labs  Lab 11/09/22 2311 11/10/22 0716 11/11/22 0118 11/12/22 0203  WBC 16.5*  --  11.4* 11.0*  LATICACIDVEN  --  1.4  --   --     Liver Function Tests: No results for input(s): "AST", "ALT", "ALKPHOS", "BILITOT", "PROT", "ALBUMIN" in the last 168 hours. No results for input(s): "LIPASE", "AMYLASE" in the last 168 hours. No results for input(s): "AMMONIA" in the last 168 hours.  ABG No results found for: "PHART", "PCO2ART", "PO2ART", "HCO3", "TCO2", "ACIDBASEDEF", "O2SAT"   Coagulation Profile: No results for input(s): "INR", "PROTIME" in the last 168 hours.  Cardiac Enzymes: No results for input(s): "CKTOTAL", "CKMB", "CKMBINDEX", "TROPONINI" in the last 168 hours.  HbA1C: Hgb A1c MFr Bld  Date/Time Value  Ref Range Status  03/22/2019 05:33 AM 5.3 4.8 - 5.6 % Final    Comment:    (NOTE) Pre diabetes:          5.7%-6.4% Diabetes:              >6.4% Glycemic control for   <7.0% adults with diabetes   12/09/2017 07:26 AM 5.7 4.6 - 6.5 % Final    Comment:    Glycemic Control Guidelines for People with Diabetes:Non Diabetic:  <6%Goal of Therapy: <7%Additional Action Suggested:  >8%     CBG: No results for input(s): "GLUCAP" in the last 168 hours.    Jacky Kindle, MD [facility] See Amion for pager If no response to pager, please call [phone] until 7pm After 7pm, Please call E-link [phone]  11/12/2022, 3:28 PM

## 2022-11-12 NOTE — Plan of Care (Signed)
  Problem: Education: Goal: Knowledge of General Education information will improve Description: Including pain rating scale, medication(s)/side effects and non-pharmacologic comfort measures 11/12/2022 0729 by Birdie Hopes, RN Outcome: Progressing 11/11/2022 1851 by Birdie Hopes, RN Outcome: Progressing   Problem: Health Behavior/Discharge Planning: Goal: Ability to manage health-related needs will improve 11/12/2022 0729 by Birdie Hopes, RN Outcome: Progressing 11/11/2022 1851 by Birdie Hopes, RN Outcome: Progressing   Problem: Clinical Measurements: Goal: Ability to maintain clinical measurements within normal limits will improve 11/12/2022 0729 by Birdie Hopes, RN Outcome: Progressing 11/11/2022 1851 by Birdie Hopes, RN Outcome: Progressing Goal: Will remain free from infection 11/12/2022 0729 by Birdie Hopes, RN Outcome: Progressing 11/11/2022 1851 by Birdie Hopes, RN Outcome: Progressing Goal: Diagnostic test results will improve 11/12/2022 0729 by Birdie Hopes, RN Outcome: Progressing 11/11/2022 1851 by Birdie Hopes, RN Outcome: Progressing Goal: Respiratory complications will improve 11/12/2022 0729 by Birdie Hopes, RN Outcome: Progressing 11/11/2022 1851 by Birdie Hopes, RN Outcome: Progressing Goal: Cardiovascular complication will be avoided 11/12/2022 0729 by Birdie Hopes, RN Outcome: Progressing 11/11/2022 1851 by Birdie Hopes, RN Outcome: Progressing   Problem: Activity: Goal: Risk for activity intolerance will decrease 11/12/2022 0729 by Birdie Hopes, RN Outcome: Progressing 11/11/2022 1851 by Birdie Hopes, RN Outcome: Progressing   Problem: Nutrition: Goal: Adequate nutrition will be maintained 11/12/2022 0729 by Birdie Hopes, RN Outcome: Progressing 11/11/2022 1851 by Birdie Hopes, RN Outcome: Progressing   Problem: Coping: Goal: Level of anxiety will decrease 11/12/2022 0729 by Birdie Hopes, RN Outcome: Progressing 11/11/2022 1851 by  Birdie Hopes, RN Outcome: Progressing   Problem: Elimination: Goal: Will not experience complications related to bowel motility 11/12/2022 0729 by Birdie Hopes, RN Outcome: Progressing 11/11/2022 1851 by Birdie Hopes, RN Outcome: Progressing Goal: Will not experience complications related to urinary retention 11/12/2022 0729 by Birdie Hopes, RN Outcome: Progressing 11/11/2022 1851 by Birdie Hopes, RN Outcome: Progressing   Problem: Pain Managment: Goal: General experience of comfort will improve 11/12/2022 0729 by Birdie Hopes, RN Outcome: Progressing 11/11/2022 1851 by Birdie Hopes, RN Outcome: Progressing   Problem: Safety: Goal: Ability to remain free from injury will improve 11/12/2022 0729 by Birdie Hopes, RN Outcome: Progressing 11/11/2022 1851 by Birdie Hopes, RN Outcome: Progressing   Problem: Skin Integrity: Goal: Risk for impaired skin integrity will decrease 11/12/2022 0729 by Birdie Hopes, RN Outcome: Progressing 11/11/2022 1851 by Birdie Hopes, RN Outcome: Progressing

## 2022-11-12 NOTE — Assessment & Plan Note (Addendum)
S/p chest tube placement 11/22. Chest tube yielded 70 cc yesterday. Pulm repeated 1 intrapleural fibrinolytic treatment as her chest tube output had decreased, but CT still shows a small loculated effusion. They recommended chest tube removal and observation if output remains low, long 21 day course of antibiotics, and f/u in clinic with imaging.   - Pulm consulted and following, awaiting recommendations for antibiotics - Continue pain regimen of oxycodone 5 mg q6h prn and toradol q6h, decrease as able  - Monitor output from chest tube

## 2022-11-12 NOTE — Procedures (Signed)
Pleural Fibrinolytic Administration Procedure Note  Dana Bryan  [id number]  17-Mar-1951  Date:11/12/22  Time:3:26 PM   Provider Performing:Timiko Offutt   Procedure: Pleural Fibrinolysis Initial day [phone])  Indication(s) Fibrinolysis of complicated pleural effusion  Consent Risks of the procedure as well as the alternatives and risks of each were explained to the patient and/or caregiver.  Consent for the procedure was obtained.   Anesthesia None   Time Out Verified patient identification, verified procedure, site/side was marked, verified correct patient position, special equipment/implants available, medications/allergies/relevant history reviewed, required imaging and test results available.   Sterile Technique Hand hygiene, gloves   Procedure Description Existing pleural catheter was cleaned and accessed in sterile manner.  '10mg'$  of tPA in 30cc of saline and '5mg'$  of dornase in 30cc of sterile water were injected into pleural space using existing pleural catheter.  Catheter will be clamped for 1 hour and then placed back to suction.   Complications/Tolerance None; patient tolerated the procedure well.  EBL None   Specimen(s) None

## 2022-11-12 NOTE — Progress Notes (Signed)
Daily Progress Note Intern Pager: [phone]  Patient name: Dayshia Ballinas Medical record number: [id number] Date of birth: 1951-12-21 Age: 71 y.o. Gender: female  Primary Care Provider: Midge Minium, MD Consultants: pulmonology, CT surgery  Code Status: Full  Pt Overview and Major Events to Date:  11/21: Admitted  11/22: Chest tube placement for loculated left pleural effusion   Assessment and Plan: Hodaya Curto is a 71 year old female with past medical history of HTN and HLD admitted for left sided CAP with parapneumonic effusion and pleuritic chest pain.   Community acquired pneumonia with pleural effusion On room air, doing well this morning. Still having a cough.  - Daily CBC/BMP - Continuous cardiac monitoring - continue CTX and azithromycin  - Sputum and blood culture pending x2 - Strict I&Os - Schedule Tylenol q6hr   Electrolyte abnormality Hyponatremia 132 and hypokalemia at 3.2.  - Consider IVF for hyponatremia if worsens  -  K repletion ordered  -  monitor daily BMPs  Aortic stenosis ECHO 08/2022 with left ventricular ejection fraction, by estimation, is 60 to 65%. The left ventricle has normal function. Moderate aortic stenosis is present. The aortic valve is bicuspid - Continuous cardiac monitoring - Potential cardiology follow up outpatient for Zio  - Monitor symptoms  Pleural effusion Noted to have small loculated pleural effusion on the left on CXR and better visualized on CT chest. S/p chest tube placement 11/22.  - Pulm initially consulted and following, recommended chest tube placement  - CT surg consulted and appreciate recs -IR consulted for chest tube placement - awaiting fluid cultures per CT surg  Acid reflux Protonix started, see above for cardiac work up   Alcohol abuse, daily use No signs of withdrawal noted within this admission. CIWAs discontinued.    Anxiety and depression Take Welbutrin and Buspar restarted  upon admission. - Stable  Hyperlipidemia Chronic and stable. Last lipid panel LDL 65 09/21/22.  - Continue home atorvastatin 20 mg daily        FEN/GI: regular diet  PPx: lovenox  Dispo:Home pending clinical improvement . Barriers include clinical improvement.   Subjective:  No acute overnight events reported. Patient denies dyspnea and chest pain although reports improved rib pain from admission. Discussed the events that have occurred, husband also at bedside and all questions answered.   Objective: Temp:  [97.5 F (36.4 C)-98.2 F (36.8 C)] 98.2 F (36.8 C) (11/23 0800) Pulse Rate:  [50-96] 53 (11/23 0428) Resp:  [18-26] 18 (11/23 0428) BP: (95-143)/(42-86) 143/63 (11/23 0428) SpO2:  [93 %-99 %] 96 % (11/23 0428) Weight:  [54.9 kg-61.4 kg] 54.9 kg (11/23 0459) Physical Exam: General: Patient well-appearing, in no acute distress. Cardiovascular: RRR, no murmurs or gallops auscultated, chest tube in place Respiratory: CTAB, no wheezing, rales or rhonchi noted Abdomen: soft, nontender, nondistended, presence of bowel sounds  Extremities: no LE edema noted bilaterally, distal pulses strong and equal bilaterally  Psych: mood appropriate, very pleasant   Laboratory: Most recent CBC Lab Results  Component Value Date   WBC 11.0 (H) 11/12/2022   HGB 11.9 (L) 11/12/2022   HCT 35.5 (L) 11/12/2022   MCV 90.1 11/12/2022   PLT 400 11/12/2022   Most recent BMP    Latest Ref Rng & Units 11/12/2022    2:03 AM  BMP  Glucose 70 - 99 mg/dL 124   BUN 8 - 23 mg/dL 11   Creatinine 0.44 - 1.00 mg/dL 0.59   Sodium 135 -  145 mmol/L 133   Potassium 3.5 - 5.1 mmol/L 3.2   Chloride 98 - 111 mmol/L 98   CO2 22 - 32 mmol/L 21   Calcium 8.9 - 10.3 mg/dL 8.5      Imaging/Diagnostic Tests: CT [facility] PLEURAL DRAIN W/INDWELL CATH W/IMG GUIDE  Result Date: 11/11/2022 INDICATION: Left-sided pleural effusion. Please perform CT-guided chest tube placement for infection source control  purposes. EXAM: CT PERC PLEURAL DRAIN W/INDWELL CATH W/IMG GUIDE COMPARISON:  Chest CT-11/10/2022 MEDICATIONS: The patient is currently admitted to the hospital and receiving intravenous antibiotics. The antibiotics were administered within an appropriate time frame prior to the initiation of the procedure. ANESTHESIA/SEDATION: Moderate (conscious) sedation was employed during this procedure as administered by the Interventional Radiology RN. A total of Versed 1 mg and Fentanyl 25 mcg was administered intravenously. Moderate Sedation Time: 21 minutes. The patient's level of consciousness and vital signs were monitored continuously by radiology nursing throughout the procedure under my direct supervision. CONTRAST:  None COMPLICATIONS: None immediate. PROCEDURE: RADIATION DOSE REDUCTION: This exam was performed according to the departmental dose-optimization program which includes automated exposure control, adjustment of the mA and/or kV according to patient size and/or use of iterative reconstruction technique. Informed written consent was obtained from the patient after a discussion of the risks, benefits and alternatives to treatment. The patient was placed supine, slightly RPO on the CT gantry and a pre procedural CT was performed re-demonstrating the known partially loculated small to moderate-sized left-sided pleural effusion. CT gantry needle position was marked and the collection identified sonographically. The procedure was planned. A timeout was performed prior to the initiation of the procedure. The inferolateral aspect of the left chest was prepped and draped in the usual sterile fashion. The overlying soft tissues were anesthetized with 1% lidocaine with epinephrine. Under direct ultrasound guidance, the left pleural effusion was accessed with a 18 gauge trocar needle. A short Amplatz wire was coiled within the left pleural space. Appropriate position was confirmed with CT imaging Next, the track was  dilated allowing placement of a 12 [place] all-purpose drainage catheter. Appropriate positioning was confirmed with a limited postprocedural CT scan. Approximately 400 cc of serous pleural fluid was aspirated. The tube was connected to a pleura vac device and sutured in place. A dressing was applied. The patient tolerated the procedure well without immediate post procedural complication. IMPRESSION: Successful CT guided placement of a 57 French all purpose drain catheter into the left pleural space with aspiration of 400 cc of serous pleural fluid. Samples were sent to the laboratory as requested by the ordering clinical team. Electronically Signed   By: Sandi Mariscal M.D.   On: 11/11/2022 14:59     Donney Dice, DO 11/12/2022, 9:15 AM  PGY-3, [place] Intern pager: [phone], text pages welcome Secure chat group [place]

## 2022-11-13 ENCOUNTER — Inpatient Hospital Stay (HOSPITAL_COMMUNITY): Payer: Medicare HMO

## 2022-11-13 DIAGNOSIS — J90 Pleural effusion, not elsewhere classified: Secondary | ICD-10-CM | POA: Diagnosis not present

## 2022-11-13 DIAGNOSIS — D72829 Elevated white blood cell count, unspecified: Secondary | ICD-10-CM | POA: Insufficient documentation

## 2022-11-13 DIAGNOSIS — R0781 Pleurodynia: Secondary | ICD-10-CM | POA: Diagnosis not present

## 2022-11-13 DIAGNOSIS — J189 Pneumonia, unspecified organism: Secondary | ICD-10-CM | POA: Diagnosis not present

## 2022-11-13 LAB — BASIC METABOLIC PANEL
Anion gap: 12 (ref 5–15)
BUN: 15 mg/dL (ref 8–23)
CO2: 21 mmol/L — ABNORMAL LOW (ref 22–32)
Calcium: 8.3 mg/dL — ABNORMAL LOW (ref 8.9–10.3)
Chloride: 98 mmol/L (ref 98–111)
Creatinine, Ser: 0.8 mg/dL (ref 0.44–1.00)
GFR, Estimated: >60 mL/min (ref >60)
Glucose, Bld: 119 mg/dL — ABNORMAL HIGH (ref 70–99)
Potassium: 5.1 mmol/L (ref 3.5–5.1)
Sodium: 131 mmol/L — ABNORMAL LOW (ref 135–145)

## 2022-11-13 LAB — EXPECTORATED SPUTUM ASSESSMENT W GRAM STAIN, RFLX TO RESP C

## 2022-11-13 LAB — CBC
HCT: 37.7 % (ref 36.0–46.0)
Hemoglobin: 13 g/dL (ref 12.0–15.0)
MCH: 31 pg (ref 26.0–34.0)
MCHC: 34.5 g/dL (ref 30.0–36.0)
MCV: 89.8 fL (ref 80.0–100.0)
Platelets: 463 10*3/uL — ABNORMAL HIGH (ref 150–400)
RBC: 4.2 MIL/uL (ref 3.87–5.11)
RDW: 14.1 % (ref 11.5–15.5)
WBC: 15.3 10*3/uL — ABNORMAL HIGH (ref 4.0–10.5)
nRBC: 0 % (ref 0.0–0.2)

## 2022-11-13 MED ORDER — FENTANYL CITRATE PF 50 MCG/ML IJ SOSY
50.0000 ug | PREFILLED_SYRINGE | Freq: Once | INTRAMUSCULAR | Status: AC
  Administered 2022-11-13: 50 ug via INTRAVENOUS
  Filled 2022-11-13: qty 1

## 2022-11-13 MED ORDER — SODIUM CHLORIDE (PF) 0.9 % IJ SOLN
10.0000 mg | Freq: Once | INTRAMUSCULAR | Status: AC
  Administered 2022-11-13: 10 mg via INTRAPLEURAL
  Filled 2022-11-13: qty 10

## 2022-11-13 MED ORDER — STERILE WATER FOR INJECTION IJ SOLN
5.0000 mg | Freq: Once | RESPIRATORY_TRACT | Status: AC
  Administered 2022-11-13: 5 mg via INTRAPLEURAL
  Filled 2022-11-13: qty 5

## 2022-11-13 MED ORDER — SODIUM CHLORIDE 0.9% FLUSH
10.0000 mL | Freq: Three times a day (TID) | INTRAVENOUS | Status: DC
  Administered 2022-11-13 – 2022-11-17 (×9): 10 mL via INTRAPLEURAL

## 2022-11-13 NOTE — Plan of Care (Signed)
  Problem: Clinical Measurements: Goal: Respiratory complications will improve Outcome: Not Progressing   Problem: Clinical Measurements: Goal: Will remain free from infection Outcome: Not Progressing   Problem: Pain Managment: Goal: General experience of comfort will improve Outcome: Not Progressing

## 2022-11-13 NOTE — Procedures (Signed)
Pleural Fibrinolytic Administration Procedure Note  Dana Bryan  [id number]  Oct 06, 1951  Date:11/13/22  Time:2:22 PM   Provider Performing:Carlito Bogert   Procedure: Pleural Fibrinolysis Subsequent day [phone])  Indication(s) Fibrinolysis of complicated pleural effusion  Consent Risks of the procedure as well as the alternatives and risks of each were explained to the patient and/or caregiver.  Consent for the procedure was obtained.   Anesthesia None   Time Out Verified patient identification, verified procedure, site/side was marked, verified correct patient position, special equipment/implants available, medications/allergies/relevant history reviewed, required imaging and test results available.   Sterile Technique Hand hygiene, gloves   Procedure Description Existing pleural catheter was cleaned and accessed in sterile manner.  '10mg'$  of tPA in 30cc of saline and '5mg'$  of dornase in 30cc of sterile water were injected into pleural space using existing pleural catheter.  Catheter will be clamped for 1 hour and then placed back to suction.   Complications/Tolerance None; patient tolerated the procedure well.  EBL None   Specimen(s) None

## 2022-11-13 NOTE — Progress Notes (Signed)
Yellow Mews score noted, patient is resting from ambulating to the bathroom, vital signs rechecked  and back to green. No escalation needed.    11/12/22 2010 11/12/22 2021 11/12/22 2030  Assess: MEWS Score  Temp 97.6 F (36.4 C)  --   --   BP 129/60  --   --   MAP (mmHg) 79  --   --   ECG Heart Rate (!) 113  --  (!) 110  Resp (!) 22 20  --   Level of Consciousness Alert  --   --   SpO2 95 %  --   --   O2 Device Room Air  --   --   Assess: MEWS Score  MEWS Temp 0 0 0  MEWS Systolic 0 0 0  MEWS Pulse '2 2 1  '$ MEWS RR 1 0 0  MEWS LOC 0 0 0  MEWS Score '3 2 1  '$ MEWS Score Color Yellow Yellow Green  Assess: if the MEWS score is Yellow or Red  Were vital signs taken at a resting state? Yes (pt  just came back from bathroom)  --   --   Focused Assessment No change from prior assessment  --   --   Does the patient meet 2 or more of the SIRS criteria? No  --   --   MEWS guidelines implemented *See Row Information* No, vital signs rechecked  --   --   Treat  Pain Scale 0-10  --   --   Pain Score 0  --   --   Assess: SIRS CRITERIA  SIRS Temperature  0 0 0  SIRS Pulse '1 1 1  '$ SIRS Respirations  1 0 0  SIRS WBC '1 1 1  '$ SIRS Score Sum  3 2 2

## 2022-11-13 NOTE — Progress Notes (Signed)
  Transition of Care [facility]) Screening Note   Patient Details  Name: Adeja Sarratt Date of Birth: 1951/04/07   Transition of Care [facility]) CM/SW Contact:    Benard Halsted, LCSW Phone Number: 11/13/2022, 8:32 AM    Transition of Care Department [facility]) has reviewed patient. We will continue to monitor patient advancement through interdisciplinary progression rounds. If new patient transition needs arise, please place a TOC consult.

## 2022-11-13 NOTE — Progress Notes (Signed)
NAME:  Dana Bryan, MRN:  [id number], DOB:  10-Sep-1951, LOS: 2 ADMISSION DATE:  11/09/2022, CONSULTATION DATE:  11/21 REFERRING MD:  Dana Bryan , REASON FOR CONSULT:  Left pleural effusion   History of Present Illness:  Dana Bryan is an 71 y.o. F who presented to [facility] on 11/21 with left sided chest pain.  Patient with reports of 2 to 4 weeks of ongoing cough without fever or chills.  On 11/20 she developed pleuritic chest pain which began to worsen.  She eventually presented to the [facility] emergency department.  Troponin within normal limits.  CT chest notable for community-acquired pneumonia of the right upper lobe, left lower lobe loculated pleural effusion.  The patient was admitted to the family medicine teaching service.  PCCM was consulted for management of left pleural effusion.  Pertinent  Medical History  Bicuspid aortic valve, mood disorder, left bundle branch block, smoking history 20 pack years.  Significant Hospital Events: Including procedures, antibiotic start and stop dates in addition to other pertinent events   11/21 presented to [facility], CT chest, IR consult 11/22 chest tube inserted left per interventional radiology 11/12/2022 lytics x 1 11/13/2022 chest tube extremely painful for any type of fluid instillation  Interim History / Subjective:  Patient had chest tube placed by IR Total chest tube output was 500 cc in last 24 hours  Objective   Blood pressure 135/74, pulse (!) 52, temperature 97.9 F (36.6 C), temperature source Oral, resp. rate 19, height '5\' 3"'$  (1.6 m), weight 62 kg, SpO2 99 %.        Intake/Output Summary (Last 24 hours) at 11/13/2022 0846 Last data filed at 11/13/2022 0600 Gross per 24 hour  Intake 20 ml  Output 905 ml  Net -885 ml   Filed Weights   11/11/22 1600 11/12/22 0459 11/13/22 0500  Weight: 61.4 kg 54.9 kg 62 kg    Examination: General: Well-nourished well-developed female no acute distress HEENT: MM  pink/moist no JVD is appreciated Neuro: Grossly intact without focal defect CV: Heart sounds are regular PULM: Diminished left base currently on room air Left chest tube in place to Pleur-evac no recent drainage total of 953 cc since chest tube insertion and lytics given x 1.  Chest tube flushed was extremely painful to the patient unable to aspirate any pleural fluid.  GI: soft, bsx4 active  GU: Voids Extremities: warm/dry, negative edema  Skin: no rashes or lesions     Resolved Hospital Problem list     Assessment & Plan:  Loculated left sided complex pleural effusion with septations  Community-acquired pneumonia Interventional radiology placed chest tube 11/11/2022 Lytics were given 11/12/2022 Approximately 953 cc since lytics were given and chest tube placed and drainage Left chest tube is no longer draining is painful for any fluid in infusion including normal saline. Check chest x-ray to confirm chest tube is in proper position I suspect she will not tolerate lytics at this time due to the sensitivity of normal saline causing extreme pain. Discussed with the physician concerning further lytic infusion  Best Practice (right click and "Reselect all SmartList Selections" daily)   Per primary  Labs   CBC: Recent Labs  Lab 11/09/22 2311 11/11/22 0118 11/12/22 0203 11/13/22 0525  WBC 16.5* 11.4* 11.0* 15.3*  NEUTROABS 14.4*  --   --   --   HGB 12.2 11.8* 11.9* 13.0  HCT 36.2 33.7* 35.5* 37.7  MCV 91.0 87.1 90.1 89.8  PLT 274 333 400 463*  Basic Metabolic Panel: Recent Labs  Lab 11/09/22 2311 11/10/22 1632 11/11/22 0118 11/12/22 0203 11/13/22 0224  NA 129* 129* 129* 133* 131*  K 3.4* 3.6 3.5 3.2* 5.1  CL 93* 99 98 98 98  CO2 21* 19* 21* 21* 21*  GLUCOSE 149* 102* 155* 124* 119*  BUN '15 10 9 11 15  '$ CREATININE 0.63 0.62 0.54 0.59 0.80  CALCIUM 8.6* 7.8* 8.2* 8.5* 8.3*   GFR: Estimated Creatinine Clearance: 54.9 mL/min (by C-G formula based on SCr of  0.8 mg/dL). Recent Labs  Lab 11/09/22 2311 11/10/22 0716 11/11/22 0118 11/12/22 0203 11/13/22 0525  WBC 16.5*  --  11.4* 11.0* 15.3*  LATICACIDVEN  --  1.4  --   --   --     Liver Function Tests: No results for input(s): "AST", "ALT", "ALKPHOS", "BILITOT", "PROT", "ALBUMIN" in the last 168 hours. No results for input(s): "LIPASE", "AMYLASE" in the last 168 hours. No results for input(s): "AMMONIA" in the last 168 hours.  ABG No results found for: "PHART", "PCO2ART", "PO2ART", "HCO3", "TCO2", "ACIDBASEDEF", "O2SAT"   Coagulation Profile: No results for input(s): "INR", "PROTIME" in the last 168 hours.  Cardiac Enzymes: No results for input(s): "CKTOTAL", "CKMB", "CKMBINDEX", "TROPONINI" in the last 168 hours.  HbA1C: Hgb A1c MFr Bld  Date/Time Value Ref Range Status  03/22/2019 05:33 AM 5.3 4.8 - 5.6 % Final    Comment:    (NOTE) Pre diabetes:          5.7%-6.4% Diabetes:              >6.4% Glycemic control for   <7.0% adults with diabetes   12/09/2017 07:26 AM 5.7 4.6 - 6.5 % Final    Comment:    Glycemic Control Guidelines for People with Diabetes:Non Diabetic:  <6%Goal of Therapy: <7%Additional Action Suggested:  >8%     CBG: No results for input(s): "GLUCAP" in the last 168 hours.   Dana Bryan ACNP Acute Care Nurse Practitioner [place] Please consult Amion 11/13/2022, 8:46 AM

## 2022-11-13 NOTE — Progress Notes (Signed)
Daily Progress Note Intern Pager: [phone]  Patient name: Dana Bryan Medical record number: [id number] Date of birth: [birth year] Age: 71 y.o. Gender: female  Primary Care Provider: Midge Minium, MD Consultants: pulmonology, IR, CT surgery  Code Status: Full code  Pt Overview and Major Events to Date:  11/21: Admitted 11/22: Chest tube placement for loculated left pleural effusion   Assessment and Plan: Dana Bryan is a 71 year old female with past medical history of HTN and HLD admitted for left sided CAP with parapneumonic effusion and pleuritic chest pain.    Community acquired pneumonia with pleural effusion On room air, doing well this morning. Still having a cough.  - Daily CBC/BMP - Continuous cardiac monitoring - Continue azithromycin for a total of 3 day course (ending today 11/24) - continue CTX for a total of 5 day course (ending 11/26) - Sputum and blood culture pending x2 - Strict I&Os - Schedule Tylenol q6hr   Electrolyte abnormality Hyponatremia 131. Hypokalemia resolved today.  - Consider IVF for hyponatremia if worsens  -  monitor daily BMPs with repletion as appropriate   Aortic stenosis ECHO 08/2022 with left ventricular ejection fraction, by estimation, is 60 to 65%. The left ventricle has normal function. Moderate aortic stenosis is present. The aortic valve is bicuspid - Continuous cardiac monitoring - Potential cardiology follow up outpatient for Zio  - Monitor symptoms  Pleural effusion Noted to have small loculated pleural effusion on the left on CXR and better visualized on CT chest. S/p chest tube placement 11/22. Fibrinolysis performed yesterday.  - Pulm initially consulted and following, recommended chest tube placement  - CT surg consulted and appreciate recs -IR consulted for chest tube placement - awaiting fluid cultures per CT surg  Acid reflux Protonix started, see above for cardiac work up    Leukocytosis WBC elevated from yesterday, now increased to 15.3 to 11. Patient has remained afebrile and reassuringly no systemic signs of infection. -monitor vitals closely  -if febrile, consider repeat blood cx and initiating antibiotic therapy -trend with daily CBC  Anxiety and depression Take Welbutrin and Buspar restarted upon admission. - Stable  Hyperlipidemia Chronic and stable. Last lipid panel LDL 65 09/21/22.  - Continue home atorvastatin 20 mg daily   Alcohol abuse, daily use-resolved as of 11/13/2022 No signs of withdrawal noted within this admission. CIWAs discontinued.         FEN/GI: regular diet  PPx: lovenox  Dispo:Home pending clinical improvement . Barriers include pending clinical improvement.   Subjective:  No acute overnight events reported. Patient reports that she was in a lot of pain after the fibrinolysis but now is better. Still denies chest pain and dyspnea although her rib pain has slightly improved.   Objective: Temp:  [97.6 F (36.4 C)-98 F (36.7 C)] 97.9 F (36.6 C) (11/24 0400) Pulse Rate:  [52-61] 52 (11/24 0609) Resp:  [16-25] 19 (11/24 0609) BP: (121-140)/(60-75) 135/74 (11/24 0400) SpO2:  [95 %-99 %] 99 % (11/24 0609) Weight:  [62 kg] 62 kg (11/24 0500) Physical Exam: General: Patient sitting upright in bed, in no acute distress. Cardiovascular: RRR, no murmurs or gallops auscultated  Respiratory: CTAB, no wheezing, rales or rhonchi noted Abdomen: soft, nontender, nondistended, presence of bowel sounds Extremities: distal pulses strong and equal bilaterally, no LE edema noted bilaterally   Laboratory: Most recent CBC Lab Results  Component Value Date   WBC 15.3 (H) 11/13/2022   HGB 13.0 11/13/2022   HCT  37.7 11/13/2022   MCV 89.8 11/13/2022   PLT 463 (H) 11/13/2022   Most recent BMP    Latest Ref Rng & Units 11/13/2022    2:24 AM  BMP  Glucose 70 - 99 mg/dL 119   BUN 8 - 23 mg/dL 15   Creatinine 0.44 - 1.00  mg/dL 0.80   Sodium 135 - 145 mmol/L 131   Potassium 3.5 - 5.1 mmol/L 5.1   Chloride 98 - 111 mmol/L 98   CO2 22 - 32 mmol/L 21   Calcium 8.9 - 10.3 mg/dL 8.3       Imaging/Diagnostic Tests: DG CHEST PORT 1 VIEW  Result Date: 11/13/2022 CLINICAL DATA:  Chest tube in place, left pleural effusion. EXAM: PORTABLE CHEST 1 VIEW COMPARISON:  Chest radiograph dated 11/12/2022 FINDINGS: The heart size and mediastinal contours are within normal limits. Opacity overlying the left lung base likely represents residual pleural fluid and associated atelectasis/airspace disease, similar to prior exam. A left-sided pleural pigtail catheter is unchanged in position. The right lung is clear with no right pleural effusion. There is no pneumothorax. IMPRESSION: Opacity overlying the left lung base likely represents residual pleural fluid and associated atelectasis/airspace disease, similar to prior exam. Electronically Signed   By: Zerita Boers M.D.   On: 11/13/2022 09:12     Donney Dice, DO 11/13/2022, 9:49 AM  PGY-3, [place] Intern pager: [phone], text pages welcome Secure chat group [place]

## 2022-11-13 NOTE — Assessment & Plan Note (Deleted)
WBC stable today, improved to 10.8 from 15.3 yesterday.  Patient remains afebrile. -Monitor daily CBC -Monitor for fever with routine vitals.

## 2022-11-14 ENCOUNTER — Inpatient Hospital Stay (HOSPITAL_COMMUNITY): Payer: Medicare HMO

## 2022-11-14 DIAGNOSIS — R0781 Pleurodynia: Secondary | ICD-10-CM | POA: Diagnosis not present

## 2022-11-14 DIAGNOSIS — J90 Pleural effusion, not elsewhere classified: Secondary | ICD-10-CM | POA: Diagnosis not present

## 2022-11-14 DIAGNOSIS — J869 Pyothorax without fistula: Secondary | ICD-10-CM

## 2022-11-14 DIAGNOSIS — J189 Pneumonia, unspecified organism: Secondary | ICD-10-CM | POA: Diagnosis not present

## 2022-11-14 LAB — CBC
HCT: 31.2 % — ABNORMAL LOW (ref 36.0–46.0)
Hemoglobin: 10.6 g/dL — ABNORMAL LOW (ref 12.0–15.0)
MCH: 30.5 pg (ref 26.0–34.0)
MCHC: 34 g/dL (ref 30.0–36.0)
MCV: 89.7 fL (ref 80.0–100.0)
Platelets: 389 10*3/uL (ref 150–400)
RBC: 3.48 MIL/uL — ABNORMAL LOW (ref 3.87–5.11)
RDW: 14.1 % (ref 11.5–15.5)
WBC: 10.3 10*3/uL (ref 4.0–10.5)
nRBC: 0 % (ref 0.0–0.2)

## 2022-11-14 LAB — BASIC METABOLIC PANEL
Anion gap: 11 (ref 5–15)
BUN: 11 mg/dL (ref 8–23)
CO2: 23 mmol/L (ref 22–32)
Calcium: 8.5 mg/dL — ABNORMAL LOW (ref 8.9–10.3)
Chloride: 103 mmol/L (ref 98–111)
Creatinine, Ser: 0.7 mg/dL (ref 0.44–1.00)
GFR, Estimated: >60 mL/min (ref >60)
Glucose, Bld: 101 mg/dL — ABNORMAL HIGH (ref 70–99)
Potassium: 4.5 mmol/L (ref 3.5–5.1)
Sodium: 137 mmol/L (ref 135–145)

## 2022-11-14 LAB — CULTURE, RESPIRATORY W GRAM STAIN: Culture: NORMAL

## 2022-11-14 MED ORDER — SODIUM CHLORIDE (PF) 0.9 % IJ SOLN
10.0000 mg | Freq: Once | INTRAMUSCULAR | Status: AC
  Administered 2022-11-14: 10 mg via INTRAPLEURAL
  Filled 2022-11-14: qty 10

## 2022-11-14 MED ORDER — SODIUM CHLORIDE 0.9% FLUSH
10.0000 mL | Freq: Three times a day (TID) | INTRAVENOUS | Status: DC
  Administered 2022-11-14 – 2022-11-16 (×4): 10 mL via INTRAPLEURAL

## 2022-11-14 MED ORDER — FENTANYL CITRATE PF 50 MCG/ML IJ SOSY
50.0000 ug | PREFILLED_SYRINGE | Freq: Once | INTRAMUSCULAR | Status: AC
  Administered 2022-11-14: 50 ug via INTRAVENOUS
  Filled 2022-11-14: qty 1

## 2022-11-14 MED ORDER — STERILE WATER FOR INJECTION IJ SOLN
5.0000 mg | Freq: Once | RESPIRATORY_TRACT | Status: AC
  Administered 2022-11-14: 5 mg via INTRAPLEURAL
  Filled 2022-11-14: qty 5

## 2022-11-14 NOTE — Progress Notes (Signed)
NAME:  Dana Bryan, MRN:  [id number], DOB:  1951/11/12, LOS: 3 ADMISSION DATE:  11/09/2022, CONSULTATION DATE:  11/21 REFERRING MD:  Larae Grooms , REASON FOR CONSULT:  Left pleural effusion   History of Present Illness:  Dana Bryan is an 71 y.o. F who presented to [facility] on 11/21 with left sided chest pain.  Patient with reports of 2 to 4 weeks of ongoing cough without fever or chills.  On 11/20 she developed pleuritic chest pain which began to worsen.  She eventually presented to the [facility] emergency department.  Troponin within normal limits.  CT chest notable for community-acquired pneumonia of the right upper lobe, left lower lobe loculated pleural effusion.  The patient was admitted to the family medicine teaching service.  PCCM was consulted for management of left pleural effusion.  Pertinent  Medical History  Bicuspid aortic valve, mood disorder, left bundle branch block, smoking history 20 pack years.  Significant Hospital Events: Including procedures, antibiotic start and stop dates in addition to other pertinent events   11/21 presented to [facility], CT chest, IR consult 11/22 chest tube inserted left per interventional radiology 11/12/2022 lytics x 1 11/13/2022 chest tube extremely painful for any type of fluid instillation  Interim History / Subjective:  Patient complained of chest pain postthrombolytic installation in chest tube Stated cough is getting better  Objective   Blood pressure 132/77, pulse 91, temperature 97.6 F (36.4 C), temperature source Oral, resp. rate 20, height '5\' 3"'$  (1.6 m), weight 61.7 kg, SpO2 95 %.        Intake/Output Summary (Last 24 hours) at 11/14/2022 1645 Last data filed at 11/13/2022 2200 Gross per 24 hour  Intake 0 ml  Output 600 ml  Net -600 ml   Filed Weights   11/12/22 0459 11/13/22 0500 11/14/22 0500  Weight: 54.9 kg 62 kg 61.7 kg    Examination: Physical exam: General: Middle-age female, lying on the  bed HEENT: [place]/AT, eyes anicteric.  moist mucus membranes Neuro: Alert, awake following commands Chest: Coarse breath sounds, no wheezes or rhonchi.  Left-sided pigtail chest tube in place Heart: Regular rate and rhythm, no murmurs or gallops Abdomen: Soft, nontender, nondistended, bowel sounds present Skin: No rash  Resolved Hospital Problem list     Assessment & Plan:  Loculated left sided complex pleural effusion with septations  Community-acquired pneumonia Interventional radiology placed chest tube 11/11/2022 Patient received third dose of thrombolytic in chest tube today Her chest tube output was 600 yesterday Though x-ray chest showing no improvement with appropriate placement of chest tube Repeat chest CT without contrast tomorrow to further evaluate this complex pleural effusion Continue antibiotics per primary team  Best Practice (right click and "Reselect all SmartList Selections" daily)   Per primary  Labs   CBC: Recent Labs  Lab 11/09/22 2311 11/11/22 0118 11/12/22 0203 11/13/22 0525 11/14/22 0235  WBC 16.5* 11.4* 11.0* 15.3* 10.3  NEUTROABS 14.4*  --   --   --   --   HGB 12.2 11.8* 11.9* 13.0 10.6*  HCT 36.2 33.7* 35.5* 37.7 31.2*  MCV 91.0 87.1 90.1 89.8 89.7  PLT 274 333 400 463* 497    Basic Metabolic Panel: Recent Labs  Lab 11/10/22 1632 11/11/22 0118 11/12/22 0203 11/13/22 0224 11/14/22 0235  NA 129* 129* 133* 131* 137  K 3.6 3.5 3.2* 5.1 4.5  CL 99 98 98 98 103  CO2 19* 21* 21* 21* 23  GLUCOSE 102* 155* 124* 119* 101*  BUN  $'10 9 11 15 11  'F$ CREATININE 0.62 0.54 0.59 0.80 0.70  CALCIUM 7.8* 8.2* 8.5* 8.3* 8.5*   GFR: Estimated Creatinine Clearance: 54.9 mL/min (by C-G formula based on SCr of 0.7 mg/dL). Recent Labs  Lab 11/10/22 0716 11/11/22 0118 11/12/22 0203 11/13/22 0525 11/14/22 0235  WBC  --  11.4* 11.0* 15.3* 10.3  LATICACIDVEN 1.4  --   --   --   --     Liver Function Tests: No results for input(s): "AST", "ALT",  "ALKPHOS", "BILITOT", "PROT", "ALBUMIN" in the last 168 hours. No results for input(s): "LIPASE", "AMYLASE" in the last 168 hours. No results for input(s): "AMMONIA" in the last 168 hours.  ABG No results found for: "PHART", "PCO2ART", "PO2ART", "HCO3", "TCO2", "ACIDBASEDEF", "O2SAT"   Coagulation Profile: No results for input(s): "INR", "PROTIME" in the last 168 hours.  Cardiac Enzymes: No results for input(s): "CKTOTAL", "CKMB", "CKMBINDEX", "TROPONINI" in the last 168 hours.  HbA1C: Hgb A1c MFr Bld  Date/Time Value Ref Range Status  03/22/2019 05:33 AM 5.3 4.8 - 5.6 % Final    Comment:    (NOTE) Pre diabetes:          5.7%-6.4% Diabetes:              >6.4% Glycemic control for   <7.0% adults with diabetes   12/09/2017 07:26 AM 5.7 4.6 - 6.5 % Final    Comment:    Glycemic Control Guidelines for People with Diabetes:Non Diabetic:  <6%Goal of Therapy: <7%Additional Action Suggested:  >8%     CBG: No results for input(s): "GLUCAP" in the last 168 hours.    Jacky Kindle, MD [place] [facility] See Amion for pager If no response to pager, please call [phone] until 7pm After 7pm, Please call E-link [phone]

## 2022-11-14 NOTE — Procedures (Signed)
Pleural Fibrinolytic Administration Procedure Note  Stefanie Hodgens  [id number]  12-Nov-1951  Date:11/14/22  Time:4:49 PM   Provider Performing:Koray Soter   Procedure: Pleural Fibrinolysis Subsequent day [phone])  Indication(s) Fibrinolysis of complicated pleural effusion  Consent Risks of the procedure as well as the alternatives and risks of each were explained to the patient and/or caregiver.  Consent for the procedure was obtained.   Anesthesia None   Time Out Verified patient identification, verified procedure, site/side was marked, verified correct patient position, special equipment/implants available, medications/allergies/relevant history reviewed, required imaging and test results available.   Sterile Technique Hand hygiene, gloves   Procedure Description Existing pleural catheter was cleaned and accessed in sterile manner.  '10mg'$  of tPA in 30cc of saline and '5mg'$  of dornase in 30cc of sterile water were injected into pleural space using existing pleural catheter.  Catheter will be clamped for 1 hour and then placed back to suction.   Complications/Tolerance None; patient tolerated the procedure well.  EBL None   Specimen(s) None

## 2022-11-14 NOTE — Progress Notes (Addendum)
Physical Therapy Note  Patient screened earlier this week, as she was doing very well ambulating independently in room. Requested PT stop by for further assessment this morning, order acknowledged. Patient states she feels comfortable walking in room but does not know how to walk out of room with equipment. Demonstrated use of RW to hold chest tube cannister and patient states she does not feel she needs additional PT. Able to safely mobilize through hallways without LOB, using RW to merely hold cannister. Verbalized understanding to have staff assist with equipment if she is to leave the room. Signing off. Please re-order if any change in status.   Candie Mile, PT, DPT Physical Therapist Acute Rehabilitation Services [place]

## 2022-11-14 NOTE — Progress Notes (Signed)
     Daily Progress Note Intern Pager: [phone]  Patient name: Dana Bryan Medical record number: [id number] Date of birth: 1951/04/21 Age: 71 y.o. Gender: female  Primary Care Provider: Midge Minium, MD Consultants: Pulmonology, IR, CT surgery Code Status: Full code  Pt Overview and Major Events to Date:  11/21: Admitted 11/22: Chest tube placement for loculated left pleural effusion  Assessment and Plan: [name] is a 71 year old female admitted with left-sided CAP with parapneumonic effusion and pleural chest pain. Pertinent PMH/PSH includes HLD and HTN.  Community acquired pneumonia with pleural effusion Stable on room air. Remain afebrile and endorses no chest pain.  Blood culture NGTD x4days - Daily CBC/BMP - Continuous cardiac monitoring - Completed 3-days azithromycin course - continue CTX for a total of 5 day course (ending 11/26) - Sputum and blood culture pending x2 - Schedule Tylenol q6hr   Electrolyte abnormality Electrolytes stable today. - Monitor closely with daily BMP  Aortic stenosis Stable.  Monitor in room shows bundle branch block. - Continuous cardiac monitoring - Potential cardiology follow up outpatient for Zio  - Monitor symptoms  Pleural effusion S/p chest tube placement 11/22 and on fibrinolysis treatment. Will complete her 3-day fibrinolysis therapy today. Chest tube yielded 400cc yesterday.  - Pulm initially consulted and following, recommended chest tube placement  - CT surg consulted and appreciate recs -IR consulted for chest tube placement - awaiting fluid cultures per CT surg  Leukocytosis WBC stable today, improved to 10.8 from 15.3 yesterday.  Patient remains afebrile. -Monitor daily CBC -Monitor for fever with routine vitals.   FEN/GI: Regular diet PPx: Lovenox Dispo:Home pending clinical improvement .  Subjective:  Patient was laying down in bed comfortably on arrival.  She says she is doing fine  and has no complaint.  Chest pain is much improved.  Objective: Temp:  [97.5 F (36.4 C)-97.6 F (36.4 C)] 97.6 F (36.4 C) (11/25 0300) Pulse Rate:  [84-91] 91 (11/25 0300) Resp:  [20-25] 20 (11/25 0329) BP: (132-138)/(70-77) 132/77 (11/25 0300) SpO2:  [95 %] 95 % (11/25 0300) Weight:  [61.7 kg] 61.7 kg (11/25 0500) Physical Exam: General: Alert, well appearing, NAD HEENT: Atraumatic, MMM, No sclera icterus CV: RRR, no murmurs, normal S1/S2 Pulm: CTAB, good WOB on RA, mildly diminished breath sound on the left and chest tube placement site without drainage. Ext: No BLE edema, +2 Pedal and radial pulse.   Laboratory: Most recent CBC Lab Results  Component Value Date   WBC 10.3 11/14/2022   HGB 10.6 (L) 11/14/2022   HCT 31.2 (L) 11/14/2022   MCV 89.7 11/14/2022   PLT 389 11/14/2022   Most recent BMP    Latest Ref Rng & Units 11/14/2022    2:35 AM  BMP  Glucose 70 - 99 mg/dL 101   BUN 8 - 23 mg/dL 11   Creatinine 0.44 - 1.00 mg/dL 0.70   Sodium 135 - 145 mmol/L 137   Potassium 3.5 - 5.1 mmol/L 4.5   Chloride 98 - 111 mmol/L 103   CO2 22 - 32 mmol/L 23   Calcium 8.9 - 10.3 mg/dL 8.5      Imaging/Diagnostic Tests: CXR (11/24) Impression: left lung atelectasis/airspace disease stable from prior image.  Alen Bleacher, MD 11/14/2022, 10:12 AM  PGY-2, [place] Intern pager: [phone], text pages welcome Secure chat group [place]

## 2022-11-15 ENCOUNTER — Inpatient Hospital Stay (HOSPITAL_COMMUNITY): Payer: Medicare HMO

## 2022-11-15 DIAGNOSIS — J189 Pneumonia, unspecified organism: Secondary | ICD-10-CM | POA: Diagnosis not present

## 2022-11-15 DIAGNOSIS — J90 Pleural effusion, not elsewhere classified: Secondary | ICD-10-CM | POA: Diagnosis not present

## 2022-11-15 LAB — CBC
HCT: 25.5 % — ABNORMAL LOW (ref 36.0–46.0)
HCT: 26.2 % — ABNORMAL LOW (ref 36.0–46.0)
Hemoglobin: 8.6 g/dL — ABNORMAL LOW (ref 12.0–15.0)
Hemoglobin: 8.6 g/dL — ABNORMAL LOW (ref 12.0–15.0)
MCH: 30.5 pg (ref 26.0–34.0)
MCH: 30.2 pg (ref 26.0–34.0)
MCHC: 33.7 g/dL (ref 30.0–36.0)
MCHC: 32.8 g/dL (ref 30.0–36.0)
MCV: 90.4 fL (ref 80.0–100.0)
MCV: 91.9 fL (ref 80.0–100.0)
Platelets: 353 10*3/uL (ref 150–400)
Platelets: 388 10*3/uL (ref 150–400)
RBC: 2.82 MIL/uL — ABNORMAL LOW (ref 3.87–5.11)
RBC: 2.85 MIL/uL — ABNORMAL LOW (ref 3.87–5.11)
RDW: 14 % (ref 11.5–15.5)
RDW: 14 % (ref 11.5–15.5)
WBC: 9 10*3/uL (ref 4.0–10.5)
WBC: 9.3 10*3/uL (ref 4.0–10.5)
nRBC: 0 % (ref 0.0–0.2)
nRBC: 0 % (ref 0.0–0.2)

## 2022-11-15 LAB — CULTURE, BLOOD (ROUTINE X 2)
Culture: NO GROWTH
Culture: NO GROWTH
Special Requests: ADEQUATE
Special Requests: ADEQUATE

## 2022-11-15 LAB — BASIC METABOLIC PANEL
Anion gap: 10 (ref 5–15)
BUN: 11 mg/dL (ref 8–23)
CO2: 24 mmol/L (ref 22–32)
Calcium: 8.2 mg/dL — ABNORMAL LOW (ref 8.9–10.3)
Chloride: 104 mmol/L (ref 98–111)
Creatinine, Ser: 0.62 mg/dL (ref 0.44–1.00)
GFR, Estimated: >60 mL/min (ref >60)
Glucose, Bld: 98 mg/dL (ref 70–99)
Potassium: 4.3 mmol/L (ref 3.5–5.1)
Sodium: 138 mmol/L (ref 135–145)

## 2022-11-15 MED ORDER — IOHEXOL 350 MG/ML SOLN
75.0000 mL | Freq: Once | INTRAVENOUS | Status: AC | PRN
  Administered 2022-11-15: 75 mL via INTRAVENOUS

## 2022-11-15 NOTE — Progress Notes (Addendum)
   NAME:  Dana Bryan, MRN:  [id number], DOB:  [birth year], LOS: 4 ADMISSION DATE:  11/09/2022, CONSULTATION DATE:  11/21 REFERRING MD:  Larae Grooms , REASON FOR CONSULT:  Left pleural effusion   History of Present Illness:  Dana Bryan is an 71 y.o. F who presented to [facility] on 11/21 with left sided chest pain.  Patient with reports of 2 to 4 weeks of ongoing cough without fever or chills.  On 11/20 she developed pleuritic chest pain which began to worsen.  She eventually presented to the [facility] emergency department.  Troponin within normal limits.  CT chest notable for community-acquired pneumonia of the right upper lobe, left lower lobe loculated pleural effusion.  The patient was admitted to the family medicine teaching service.  PCCM was consulted for management of left pleural effusion.  Pertinent  Medical History  Bicuspid aortic valve, mood disorder, left bundle branch block, smoking history 20 pack years.  Significant Hospital Events: Including procedures, antibiotic start and stop dates in addition to other pertinent events   11/21 presented to [facility], CT chest, IR consult 11/22 chest tube inserted left per interventional radiology 11/12/2022 lytics x 1 11/24 lytics X 2 11/25 lytics X 3  Interim History / Subjective:   Chest output is slowing down.  Overall recorded 625 cc out but it looks like she has had only 50 cc out since overnight shift.  Objective   Blood pressure 127/67, pulse 90, temperature 98.1 F (36.7 C), temperature source Oral, resp. rate 18, height '5\' 3"'$  (1.6 m), weight 60.4 kg, SpO2 93 %.        Intake/Output Summary (Last 24 hours) at 11/15/2022 1548 Last data filed at 11/15/2022 0330 Gross per 24 hour  Intake 290 ml  Output 625 ml  Net -335 ml   Filed Weights   11/13/22 0500 11/14/22 0500 11/15/22 0310  Weight: 62 kg 61.7 kg 60.4 kg    Examination: Gen:      No acute distress HEENT:  EOMI, sclera anicteric Neck:     No masses;  no thyromegaly Lungs:    Clear to auscultation bilaterally; normal respiratory effort.  Left-sided chest tube with serosanguineous output CV:         Regular rate and rhythm; no murmurs Abd:      + bowel sounds; soft, non-tender; no palpable masses, no distension Ext:    No edema; adequate peripheral perfusion Skin:      Warm and dry; no rash Neuro: alert and oriented x 3 Psych: normal mood and affect   Resolved Hospital Problem list     Assessment & Plan:  Loculated left sided complex pleural effusion with septations  Community-acquired pneumonia Interventional radiology placed chest tube 11/11/2022 Patient received third dose of thrombolytic in chest tube on 11/25 Chest x-ray still shows persistent lower lung opacity. Obtain CT chest to reevaluate.  If the chest tube is not communicating with loculated effusion then she may need another tube to be placed.  Best Practice (right click and "Reselect all SmartList Selections" daily)   Per primary  Signature:   Marshell Garfinkel MD [place] [facility] See Amion for pager  If no response to pager , please call [phone] until 7pm After 7:00 pm call [place]  [phone] 11/15/2022, 3:48 PM

## 2022-11-15 NOTE — Evaluation (Signed)
Occupational Therapy Evaluation Patient Details Name: Dana Bryan MRN: [id number] DOB: Sep 15, 1951 Today's Date: 11/15/2022   History of Present Illness Pt is a 71 y.o female presenting with onset of L sided pleuritic chest pain on 11/10/2022. Pt with parapneumatic effusion and pleuritic chest pain. Pt is now s/p chest tube placement 11/22 and completed 3 days of fibrinolysis treatment. PMH significant for HLD and HTN.   Clinical Impression   PTA, pt lived with her husband and was independent. Upon eval, pt near baseline but with decreased activity tolerance. Pt interested in education regarding energy conservation, so providing energy conservation handout and engaging pt in education regarding energy conservation in her personal home setting as well as building activity tolerance as appropriate. Pt very collaborative and verbalizing understanding. Pt scoring a 2 on the short blessed test indicating nearly normal cognition, and pt's husband reports she is at her baseline. Pt continues to perform ADL and IADL independently. Recommend discharge home once medically appropriate. No further acute needs identified. OT to sign off. Thank you for this order. Re-consult if change in status.      Recommendations for follow up therapy are one component of a multi-disciplinary discharge planning process, led by the attending physician.  Recommendations may be updated based on patient status, additional functional criteria and insurance authorization.   Follow Up Recommendations  No OT follow up     Assistance Recommended at Discharge PRN  Patient can return home with the following Other (comment) (as needed/per pt request)    Functional Status Assessment  Patient has had a recent decline in their functional status and demonstrates the ability to make significant improvements in function in a reasonable and predictable amount of time.  Equipment Recommendations  None recommended by OT     Recommendations for Other Services       Precautions / Restrictions Precautions Precautions: Other (comment) Precaution Comments: chest tube      Mobility Bed Mobility Overal bed mobility: Independent                  Transfers Overall transfer level: Independent                        Balance Overall balance assessment: Independent                                         ADL either performed or assessed with clinical judgement   ADL Overall ADL's : Independent                                             Vision         Perception Perception Perception Tested?: No   Praxis Praxis Praxis tested?: Not tested    Pertinent Vitals/Pain Pain Assessment Pain Assessment: Faces Faces Pain Scale: No hurt Pain Intervention(s): Monitored during session     Hand Dominance     Extremity/Trunk Assessment Upper Extremity Assessment Upper Extremity Assessment: Overall WFL for tasks assessed   Lower Extremity Assessment Lower Extremity Assessment: Overall WFL for tasks assessed   Cervical / Trunk Assessment Cervical / Trunk Assessment: Normal   Communication Communication Communication: No difficulties   Cognition Arousal/Alertness: Awake/alert Behavior During Therapy: WFL for tasks assessed/performed Overall Cognitive Status: Within  Functional Limits for tasks assessed                                 General Comments: Pt scoring a 2 on the short blessed test indicating nearly normal cognition. Pt with one error during stating months of year in reverse order but able to correct with one indirect verbal cue. Pt able to verbalize how she performs medication management safely at home using pill box and identify how she will know if she misses a dose. Verbalizing chest tube management as well. Very collaborative during review of energy conservation     General Comments  VSS. Seeing pt today for energy  conservation education only. OT and pt reviewing how strategies may be utilized within pt's own context    Exercises     Shoulder Instructions      Home Living Family/patient expects to be discharged to:: Private residence Living Arrangements: Spouse/significant other                                      Prior Functioning/Environment Prior Level of Function : Independent/Modified Independent;Driving               ADLs Comments: independent in ADL, IADL, and driving.        OT Problem List:        OT Treatment/Interventions:      OT Goals(Current goals can be found in the care plan section) Acute Rehab OT Goals Patient Stated Goal: get better OT Goal Formulation: With patient/family  OT Frequency:      Co-evaluation              AM-PAC OT "6 Clicks" Daily Activity     Outcome Measure Help from another person eating meals?: None Help from another person taking care of personal grooming?: None Help from another person toileting, which includes using toliet, bedpan, or urinal?: None Help from another person bathing (including washing, rinsing, drying)?: None Help from another person to put on and taking off regular upper body clothing?: None Help from another person to put on and taking off regular lower body clothing?: None 6 Click Score: 24   End of Session Nurse Communication: Mobility status  Activity Tolerance: Patient tolerated treatment well Patient left: Other (comment) (Pt at sink with husband)                   Time: 3532-9924 OT Time Calculation (min): 18 min Charges:  OT General Charges $OT Visit: 1 Visit OT Evaluation $OT Eval Low Complexity: 1 Low  Elder Cyphers, OTR/L [facility] Acute Rehabilitation Office: [phone]   Magnus Ivan 11/15/2022, 2:55 PM

## 2022-11-15 NOTE — Progress Notes (Signed)
     Daily Progress Note Intern Pager: [phone]  Patient name: Dana Bryan Medical record number: [id number] Date of birth: 1951/08/05 Age: 71 y.o. Gender: female  Primary Care Provider: Midge Minium, MD Consultants: Pulmonology, IR, CT surgery Code Status: Full code  Pt Overview and Major Events to Date:  11/21: Admitted 11/22: Chest tube placement for loculated left pleural effusion  Assessment and Plan: Dana Bryan is a 71 year old female admitted with left-sided With parapneumonic effusion and pleuritic chest pain. Pertinent PMH/PSH includes HLD and HTN.  * Pleural effusion S/p chest tube placement 11/22 and completed 3 days of fibrinolysis treatment. Chest tube yielded 625cc yesterday. CXR from yesterday still show persistent effusion. Will obtain Chest CT per PCCM recs.  - Pulm consulted and following - Follow up repeat Chest CT today per Pccm --Continue to monitor output from chest CT  Community acquired pneumonia with pleural effusion Stable. Remain afebrile with good work of breathing on room air.  Blood culture and chest tube culture NGTD.  - Daily CBC/BMP - Completed 3-days azithromycin course - Last dose of CTX today to complete 5-day course - Schedule Tylenol q6hr -Continue to monitor routine vitals   Aortic stenosis Stable.  - Continuous cardiac monitoring - Potential cardiology follow up outpatient for Zio  - Monitor symptoms   FEN/GI: Regular diet PPx: Lovenox Dispo:Home pending clinical improvement . Barriers include clinical status.   Subjective:  Patient was awake and reports feeling no different than yesterday. Reported only concern was was the lack of improvement reported on CXR.   Objective: Temp:  [98.1 F (36.7 C)-98.7 F (37.1 C)] 98.1 F (36.7 C) (11/26 0310) Pulse Rate:  [49-90] 90 (11/26 0310) Resp:  [18-20] 18 (11/26 0310) BP: (127-135)/(48-67) 127/67 (11/26 0310) SpO2:  [93 %-100 %] 93 % (11/26 0310) Weight:   [60.4 kg] 60.4 kg (11/26 0310) Physical Exam: General: Alert, well appearing, NAD HEENT: Atraumatic, MMM, No sclera icterus CV: RRR, no murmurs, normal S1/S2 Pulm:Coarse , good WOB on RA,left  chest tube in place  Laboratory: Most recent CBC Lab Results  Component Value Date   WBC 9.0 11/15/2022   HGB 8.6 (L) 11/15/2022   HCT 25.5 (L) 11/15/2022   MCV 90.4 11/15/2022   PLT 353 11/15/2022   Most recent BMP    Latest Ref Rng & Units 11/15/2022    2:57 AM  BMP  Glucose 70 - 99 mg/dL 98   BUN 8 - 23 mg/dL 11   Creatinine 0.44 - 1.00 mg/dL 0.62   Sodium 135 - 145 mmol/L 138   Potassium 3.5 - 5.1 mmol/L 4.3   Chloride 98 - 111 mmol/L 104   CO2 22 - 32 mmol/L 24   Calcium 8.9 - 10.3 mg/dL 8.2     Imaging/Diagnostic Tests: CXR reviewed by me show persistent left pleura effusion.   Alen Bleacher, MD 11/15/2022, 8:10 AM  PGY-2, [place] Intern pager: [phone], text pages welcome Secure chat group [place]

## 2022-11-16 DIAGNOSIS — J90 Pleural effusion, not elsewhere classified: Secondary | ICD-10-CM | POA: Diagnosis not present

## 2022-11-16 LAB — BASIC METABOLIC PANEL
Anion gap: 9 (ref 5–15)
BUN: 9 mg/dL (ref 8–23)
CO2: 24 mmol/L (ref 22–32)
Calcium: 8 mg/dL — ABNORMAL LOW (ref 8.9–10.3)
Chloride: 104 mmol/L (ref 98–111)
Creatinine, Ser: 0.61 mg/dL (ref 0.44–1.00)
GFR, Estimated: >60 mL/min (ref >60)
Glucose, Bld: 95 mg/dL (ref 70–99)
Potassium: 3.7 mmol/L (ref 3.5–5.1)
Sodium: 137 mmol/L (ref 135–145)

## 2022-11-16 LAB — CBC
HCT: 25.5 % — ABNORMAL LOW (ref 36.0–46.0)
Hemoglobin: 8.4 g/dL — ABNORMAL LOW (ref 12.0–15.0)
MCH: 29.9 pg (ref 26.0–34.0)
MCHC: 32.9 g/dL (ref 30.0–36.0)
MCV: 90.7 fL (ref 80.0–100.0)
Platelets: 384 10*3/uL (ref 150–400)
RBC: 2.81 MIL/uL — ABNORMAL LOW (ref 3.87–5.11)
RDW: 14.1 % (ref 11.5–15.5)
WBC: 6.8 10*3/uL (ref 4.0–10.5)
nRBC: 0 % (ref 0.0–0.2)

## 2022-11-16 LAB — AEROBIC/ANAEROBIC CULTURE W GRAM STAIN (SURGICAL/DEEP WOUND): Culture: NO GROWTH

## 2022-11-16 LAB — ALBUMIN: Albumin: 1.9 g/dL — ABNORMAL LOW (ref 3.5–5.0)

## 2022-11-16 MED ORDER — SODIUM CHLORIDE (PF) 0.9 % IJ SOLN
10.0000 mg | Freq: Once | INTRAMUSCULAR | Status: AC
  Administered 2022-11-16: 10 mg via INTRAPLEURAL
  Filled 2022-11-16: qty 10

## 2022-11-16 MED ORDER — SODIUM CHLORIDE 0.9% FLUSH: 10.0000 mL | Freq: Three times a day (TID) | INTRAVENOUS | Status: DC

## 2022-11-16 MED ORDER — ADULT MULTIVITAMIN W/MINERALS CH
1.0000 | ORAL_TABLET | Freq: Every day | ORAL | Status: DC
  Administered 2022-11-17 – 2022-11-18 (×2): 1 via ORAL
  Filled 2022-11-16 (×2): qty 1

## 2022-11-16 MED ORDER — ENSURE ENLIVE PO LIQD
237.0000 mL | Freq: Two times a day (BID) | ORAL | Status: DC
  Administered 2022-11-17 (×2): 237 mL via ORAL

## 2022-11-16 MED ORDER — FENTANYL CITRATE PF 50 MCG/ML IJ SOSY
12.5000 ug | PREFILLED_SYRINGE | Freq: Once | INTRAMUSCULAR | Status: AC
  Administered 2022-11-16: 12.5 ug via INTRAVENOUS
  Filled 2022-11-16: qty 1

## 2022-11-16 MED ORDER — STERILE WATER FOR INJECTION IJ SOLN
5.0000 mg | Freq: Once | RESPIRATORY_TRACT | Status: AC
  Administered 2022-11-16: 5 mg via INTRAPLEURAL
  Filled 2022-11-16: qty 5

## 2022-11-16 NOTE — Progress Notes (Signed)
Mobility Specialist: Progress Note   11/16/22 1526  Mobility  Activity Ambulated independently in hallway  Level of Assistance Independent after set-up  Assistive Device None  Distance Ambulated (ft) 400 ft  Activity Response Tolerated well  Mobility Referral Yes  $Mobility charge 1 Mobility   Pre-Mobility: 97 HR, 96% SpO2 Post-Mobility: 112 HR, 96% SpO2  Pt received sitting EOB and agreeable to mobility. C/o mild soreness at chest tube site, otherwise asymptomatic. Pt sitting EOB after session with call bell and phone in reach.   Dana Bryan Mobility Specialist Please contact via SecureChat or Rehab office at [phone]

## 2022-11-16 NOTE — Progress Notes (Addendum)
   NAME:  Dana Bryan, MRN:  [id number], DOB:  01/11/1951, LOS: 5 ADMISSION DATE:  11/09/2022, CONSULTATION DATE:  11/21 REFERRING MD:  Larae Grooms , REASON FOR CONSULT:  Left pleural effusion   History of Present Illness:  Dana Bryan is an 71 y.o. F who presented to [facility] on 11/21 with left sided chest pain.  Patient with reports of 2 to 4 weeks of ongoing cough without fever or chills.  On 11/20 she developed pleuritic chest pain which began to worsen.  She eventually presented to the [facility] emergency department.  Troponin within normal limits.  CT chest notable for community-acquired pneumonia of the right upper lobe, left lower lobe loculated pleural effusion.  The patient was admitted to the family medicine teaching service.  PCCM was consulted for management of left pleural effusion.  Pertinent  Medical History  Bicuspid aortic valve, mood disorder, left bundle branch block, smoking history 20 pack years.  Significant Hospital Events: Including procedures, antibiotic start and stop dates in addition to other pertinent events   11/21 presented to [facility], CT chest, IR consult 11/22 chest tube inserted left per interventional radiology 11/12/2022 lytics x 1 11/24 lytics X 2 11/25 lytics X 3 11/26: CT showing left sided loculated pleural effusion with chest tube in place  Interim History / Subjective:  CT output last 24 hours 20 cc  On room air  Objective   Blood pressure 123/76, pulse 86, temperature 97.9 F (36.6 C), temperature source Oral, resp. rate 20, height '5\' 3"'$  (1.6 m), weight 60.4 kg, SpO2 96 %.        Intake/Output Summary (Last 24 hours) at 11/16/2022 1516 Last data filed at 11/16/2022 0600 Gross per 24 hour  Intake 20 ml  Output 20 ml  Net 0 ml    Filed Weights   11/13/22 0500 11/14/22 0500 11/15/22 0310  Weight: 62 kg 61.7 kg 60.4 kg    Examination: General:  NAD HEENT: MM pink/moist Neuro: Aox3; MAE CV: s1s2, no m/r/g PULM:  dim  clear BS bilaterally; on room air GI: soft, bsx4 active  Extremities: warm/dry, no edema  Skin: no rashes or lesions appreciated  Resolved Hospital Problem list     Assessment & Plan:  Loculated left sided complex pleural effusion with septations  Community-acquired pneumonia Interventional radiology placed chest tube 11/11/2022 Patient received third dose of thrombolytic in chest tube on 11/25 11/26 CT chest showing left sided loculated pleural effusion with chest tube in place P: -on room air -will administer tube lytics today -cxr in am -monitor CT output -CT -20 suction -flush CT per protocol -PT/OT   Best Practice (right click and "Reselect all SmartList Selections" daily)   Per primary  Signature:   JD Geryl [facility] 11/16/2022, 3:25 PM  Please see [email or web address] for pager details.  From 7A-7P if no response, please call [phone]. After hours, please call ELink [phone].

## 2022-11-16 NOTE — Plan of Care (Signed)

## 2022-11-16 NOTE — Care Management Important Message (Signed)
Important Message  Patient Details  Name: Dana Bryan MRN: [id number] Date of Birth: 05-26-1951   Medicare Important Message Given:  Yes     Sharran Caratachea Montine Circle 11/16/2022, 3:37 PM

## 2022-11-16 NOTE — Progress Notes (Addendum)
Daily Progress Note Intern Pager: [phone]  Patient name: Dana Bryan Medical record number: [id number] Date of birth: [birth year] Age: 71 y.o. Gender: female  Primary Care Provider: Midge Minium, MD Consultants: Pulmonology, IR, CT Surgery  Code Status: FULL  Pt Overview and Major Events to Date:  11/21 - Admitted 11/22 - Chest tube placement for loculated left pleural effusion  Assessment and Plan: Dana Bryan is a 71 year old female with PMH of HTN and HLD admitted with left-sided CAP with parapneumonic effusion and pleuritic chest pain.   * Pleural effusion S/p chest tube placement 11/22 and completed 3 days of fibrinolysis treatment. Chest tube yielded 625cc yesterday. Repeat CT showed slightly decreased pleural effusion, strandy opacities in left lung and minimal airspace disease in the RUL, mediastinal and left hilar lymphadenopathy. - Pulm consulted and following - Continue pain regimen of oxycodone 5 mg q6h prn and toradol q6h, decrease as able  - Monitor output from chest tube  Community acquired pneumonia with pleural effusion Stable. Remain afebrile with good work of breathing on room air. Blood culture and chest tube culture NGTD. Antibiotics completed. - Daily CBC/BMP - Schedule Tylenol q6hr - Continue to monitor routine vitals   Aortic stenosis Stable.  - Continuous cardiac monitoring - Potential cardiology follow up outpatient for Zio  - Monitor symptoms  Alcohol abuse, daily use-resolved as of 11/13/2022    FEN/GI: Regular PPx: Lovenox Dispo: Home pending clinical improvement.    Subjective:  Patient is doing well. No acute events overnight. Her pain seems to be managed by the pain medications and she feels as if she may be ready to start weaning off. She expresses some pain over her left clavicular area and up into her neck when taking deep breaths, the same pain as on admission with any characteristic changes and only  occurs with cough. Patient denies any chest pain, but does still endorse SOB with exertion. She is able to ambulate well to and from the bathroom alone and would like to go on more walks with her husband and nurse. She feels as if she is improving as her appetite has also returned.   Objective: Temp:  [97.7 F (36.5 C)-98.5 F (36.9 C)] 98.5 F (36.9 C) (11/27 0853) Pulse Rate:  [60-96] 96 (11/27 0853) Resp:  [15-20] 17 (11/27 0853) BP: (117-139)/(58-68) 124/61 (11/27 0853) SpO2:  [94 %-98 %] 94 % (11/27 0853) Physical Exam: General: Alert in NAD. Cardiovascular: RRR without R/G. Aortic stenosis murmur. Respiratory: CTAB. Left-sided chest tube with serosanguineous output. Abdomen: Soft, nontender, nondistended. Normal bowel sounds. Extremities: No LLE edema. Psych: Normal mood and affect.  Laboratory: Most recent CBC Lab Results  Component Value Date   WBC 6.8 11/16/2022   HGB 8.4 (L) 11/16/2022   HCT 25.5 (L) 11/16/2022   MCV 90.7 11/16/2022   PLT 384 11/16/2022   Most recent BMP    Latest Ref Rng & Units 11/16/2022    3:20 AM  BMP  Glucose 70 - 99 mg/dL 95   BUN 8 - 23 mg/dL 9   Creatinine 0.44 - 1.00 mg/dL 0.61   Sodium 135 - 145 mmol/L 137   Potassium 3.5 - 5.1 mmol/L 3.7   Chloride 98 - 111 mmol/L 104   CO2 22 - 32 mmol/L 24   Calcium 8.9 - 10.3 mg/dL 8.0     Other pertinent labs - Ca: 8.0 - Albumin: 1.9 - Corrected Ca: 9.8   Imaging/Diagnostic Tests: Repeat CT Chest w/  Contrast - 11/26 Radiologist Impression:  1. Loculated pleural effusion containing small foci of air with left-sided chest tube in place, slightly decreased in size from the prior exam. 2. Strandy opacities in the left lung and minimal airspace disease in the right upper lobe, which may be infectious or inflammatory. 3. Mediastinal and left hilar lymphadenopathy, likely reactive. 4. Aortic atherosclerosis.  Britt Bolognese, Medical Student 11/16/2022, 9:02 AM  I was personally present  and performed or re-performed the history, physical exam and medical decision making activities of this service and have verified that the service and findings are accurately documented in the student's note. My edits are noted within the note above. Please also see attending's attestation.   Donney Dice, DO                  11/16/2022, 9:14 AM PGY-3, [place] Intern pager: [phone], text pages welcome Secure chat group [place]

## 2022-11-16 NOTE — Procedures (Signed)
Pleural Fibrinolytic Administration Procedure Note  Dana Bryan  [id number]  June 19, 1951  Date:11/16/22  Time:4:49 PM   Provider Performing:Dennie Vecchio D Rollene Rotunda   Procedure: Pleural Fibrinolysis Subsequent day [phone])  Indication(s) Fibrinolysis of complicated pleural effusion  Consent Risks of the procedure as well as the alternatives and risks of each were explained to the patient and/or caregiver.  Consent for the procedure was obtained.   Anesthesia None   Time Out Verified patient identification, verified procedure, site/side was marked, verified correct patient position, special equipment/implants available, medications/allergies/relevant history reviewed, required imaging and test results available.   Sterile Technique Hand hygiene, gloves   Procedure Description Existing pleural catheter was cleaned and accessed in sterile manner.  '10mg'$  of tPA in 30cc of saline and '5mg'$  of dornase in 30cc of sterile water were injected into pleural space using existing pleural catheter.  Catheter will be clamped for 1 hour and then placed back to suction.   Complications/Tolerance No complications; some pain with tube lytic administration requiring fentanyl  EBL None   Specimen(s) None  JD Rexene Agent [place] [facility] 11/16/2022, 4:50 PM  Please see [email or web address] for pager details.  From 7A-7P if no response, please call [phone]. After hours, please call ELink [phone].

## 2022-11-16 NOTE — Plan of Care (Signed)

## 2022-11-17 ENCOUNTER — Telehealth: Payer: Self-pay | Admitting: Physician Assistant

## 2022-11-17 ENCOUNTER — Inpatient Hospital Stay (HOSPITAL_COMMUNITY): Payer: Medicare HMO

## 2022-11-17 DIAGNOSIS — J90 Pleural effusion, not elsewhere classified: Secondary | ICD-10-CM | POA: Diagnosis not present

## 2022-11-17 LAB — CBC
HCT: 26 % — ABNORMAL LOW (ref 36.0–46.0)
Hemoglobin: 8.5 g/dL — ABNORMAL LOW (ref 12.0–15.0)
MCH: 29.4 pg (ref 26.0–34.0)
MCHC: 32.7 g/dL (ref 30.0–36.0)
MCV: 90 fL (ref 80.0–100.0)
Platelets: 428 10*3/uL — ABNORMAL HIGH (ref 150–400)
RBC: 2.89 MIL/uL — ABNORMAL LOW (ref 3.87–5.11)
RDW: 13.8 % (ref 11.5–15.5)
WBC: 6.5 10*3/uL (ref 4.0–10.5)
nRBC: 0 % (ref 0.0–0.2)

## 2022-11-17 LAB — CYTOLOGY - NON PAP

## 2022-11-17 MED ORDER — FENTANYL CITRATE PF 50 MCG/ML IJ SOSY: 12.5000 ug | PREFILLED_SYRINGE | Freq: Once | INTRAMUSCULAR | Status: DC

## 2022-11-17 MED ORDER — AMOXICILLIN-POT CLAVULANATE 875-125 MG PO TABS
1.0000 | ORAL_TABLET | Freq: Two times a day (BID) | ORAL | Status: DC
  Administered 2022-11-17 – 2022-11-18 (×2): 1 via ORAL
  Filled 2022-11-17 (×2): qty 1

## 2022-11-17 NOTE — Progress Notes (Addendum)
NAME:  Dana Bryan, MRN:  [id number], DOB:  Jul 11, 1951, LOS: 6 ADMISSION DATE:  11/09/2022, CONSULTATION DATE:  11/21 REFERRING MD:  Larae Grooms , REASON FOR CONSULT:  Left pleural effusion   History of Present Illness:  Dana Bryan is an 71 y.o. F who presented to [facility] on 11/21 with left sided chest pain.  Patient with reports of 2 to 4 weeks of ongoing cough without fever or chills.  On 11/20 she developed pleuritic chest pain which began to worsen.  She eventually presented to the [facility] emergency department.  Troponin within normal limits.  CT chest notable for community-acquired pneumonia of the right upper lobe, left lower lobe loculated pleural effusion.  The patient was admitted to the family medicine teaching service.  PCCM was consulted for management of left pleural effusion.  Pertinent  Medical History  Bicuspid aortic valve, mood disorder, left bundle branch block, smoking history 20 pack years.  Significant Hospital Events: Including procedures, antibiotic start and stop dates in addition to other pertinent events   11/21 presented to [facility], CT chest, IR consult 11/22 chest tube inserted left per interventional radiology 11/12/2022 lytics x 1 11/24 lytics X 2 11/25 lytics X 3 11/26: CT showing left sided loculated pleural effusion with chest tube in place  Interim History / Subjective:  CT output only 70cc last 24 hours CXR with minimal improvement on L pleural effusion On room air  Objective   Blood pressure 124/72, pulse 87, temperature 98.4 F (36.9 C), temperature source Oral, resp. rate (!) 24, height '5\' 3"'$  (1.6 m), weight 60.3 kg, SpO2 95 %.        Intake/Output Summary (Last 24 hours) at 11/17/2022 1046 Last data filed at 11/17/2022 0700 Gross per 24 hour  Intake 540 ml  Output 73 ml  Net 467 ml    Filed Weights   11/14/22 0500 11/15/22 0310 11/17/22 0500  Weight: 61.7 kg 60.4 kg 60.3 kg    Examination: General:  NAD HEENT: MM  pink/moist Neuro: Aox3; MAE CV: s1s2, no m/r/g PULM:  dim clear BS bilaterally; on room air; left CT in place GI: soft, bsx4 active  Extremities: warm/dry, no edema  Skin: no rashes or lesions appreciated  Resolved Hospital Problem list     Assessment & Plan:  Loculated left sided complex pleural effusion with septations  Community-acquired pneumonia Interventional radiology placed chest tube 11/11/2022 Patient received third dose of thrombolytic in chest tube on 11/25 11/26 CT chest showing left sided loculated pleural effusion with chest tube in place P: -on room air -minimal CT output; pull CT today -cxr in am -pulm toiletry -PT/OT -pleural cultures with no growth -start on augmentin po for at least 14 days -will make f/u appt for patient w/ [place] pulm in 14 days with available APP or MD   Best Practice (right click and "Reselect all SmartList Selections" daily)   Per primary  Signature:   JD Geryl Rankins [facility] 11/17/2022, 10:46 AM  Please see [email or web address] for pager details.  From 7A-7P if no response, please call [phone]. After hours, please call ELink [phone].   ----------------------------------------  Attending note: I have seen and examined the patient. History, labs and imaging reviewed.  71 year old with history of prior smoking, left bundle branch presenting with community-acquired pneumonia, loculated pleural effusion status post chest tube and intrapleural lytics x 4 She got her fourth dose of lytics intrapleural yesterday with only 70 cc of output over 24 hours.  Blood pressure 139/81, pulse 92, temperature 97.9 F (36.6 C), temperature source Oral, resp. rate 17, height '5\' 3"'$  (1.6 m), weight 60.3 kg, SpO2 98 %. Gen:      No acute distress HEENT:  EOMI, sclera anicteric Neck:     No masses; no thyromegaly Lungs:    Clear to auscultation bilaterally; normal respiratory effort CV:         Regular rate and  rhythm; no murmurs Abd:      + bowel sounds; soft, non-tender; no palpable masses, no distension Ext:    No edema; adequate peripheral perfusion Skin:      Warm and dry; no rash Neuro: alert and oriented x 3 Psych: normal mood and affect   Assessment/plan: Loculated effusion in the setting of community-acquired pneumonia Overall the drainage from the tube has fallen off Reviewed the CT which still shows a small loculated component to the effusion. Clinically she looks good and since there is no more output from the chest tube we will remove it and watch clinically Recommend 14 more days of p.o. Augmentin Follow-up in pulmonary clinic in 2 weeks  PCCM will sign off. Please call with questions  Marshell Garfinkel MD [place] [facility] 11/17/2022, 7:43 PM

## 2022-11-17 NOTE — Plan of Care (Signed)

## 2022-11-17 NOTE — Discharge Instructions (Addendum)
Dear Dana Bryan,   Thank you for letting us participate in your care! In this section, you will find a brief hospital admission summary of why you were admitted to the hospital and post-hospital plan.  You were admitted because you were experiencing chest pain and were found to have a loculated pleural effusion secondary to pneumonia in the left lung. You were treated with IV antibiotics and a chest tube was placed to drain the pleural effusion and removed prior to discharge.   The following changes were made: continue 14 day course of oral Augmentin antibiotics, 5 tablets of 5 mg oxycodone were provided as well for pain control if needed, you can take this along with Tylenol and Ibuprofen at home.  Please make sure to schedule an appointment with your PCP at your earliest convenience for a hospital follow up. Please follow up with the pulmonology office on 12/12.    POST-HOSPITAL & CARE INSTRUCTIONS Please let PCP/Specialists know of any changes in medications that were made.  Please see medications section of this packet for any medication changes.   DOCTOR'S APPOINTMENTS & FOLLOW UP Future Appointments  Date Time Provider [place]  12/01/2022  9:00 AM Parrett, Fonnie Mu, NP [facility] None  03/23/2023  8:40 AM Midge Minium, MD [facility]    RETURN PRECAUTIONS: Worsening chest pain Severe shortness of breath  Thank you for choosing [facility]! Take care and be well!  [facility]  [place], [place] [address] [phone]

## 2022-11-17 NOTE — Telephone Encounter (Signed)
Patient is scheduled on 12/01/2022 at 9:00am with Rexene Edison, NP. Reminder mailed to address on file. Nothing further needed.

## 2022-11-17 NOTE — Progress Notes (Signed)
Called Rapid response for chest tube removal at 1133am

## 2022-11-17 NOTE — Discharge Summary (Incomplete)
[facility] Discharge Summary  Patient name: Dana Bryan Medical record number: [id number] Date of birth: 1951-12-15 Age: 71 y.o. Gender: female Date of Admission: 11/09/2022  Date of Discharge: 11/18/22 Admitting Physician: Erskine Emery, MD  Primary Care Provider: Midge Minium, MD Consultants: Pulmonology, IR, CT Surgery  Indication for Hospitalization: Loculated Pleural Effusion 2/2 L sided CAP  Discharge Diagnoses/Problem List:  Principal Problem for Admission: Loculated Pleural Effusion 2/2 L sided CAP Other Problems addressed during stay:  Principal Problem:   Pleural effusion Active Problems:   Community acquired pneumonia with pleural effusion   Aortic stenosis   Electrolyte abnormality   Acid reflux   Hyperlipidemia   Anxiety and depression   Community acquired pneumonia of left lower lobe of lung   Pleuritic chest pain   Leukocytosis   Empyema [facility])   Brief Hospital Course:  Dana Bryan is a 71 year old female with past medical history of HTN and HLD admitted for left sided CAP with parapneumonic effusion and pleuritic chest pain.  Community acquired pneumonia with pleural effusion On admission patient had left sided pleuritic chest pain. CXR demonstrated worsening left lobe opacity with development of pleural effusion. CT chest demonstrated small loculated left pleural effusion with associated left lower lobe consolidation. Patchy ground-glass opacities of the RUL and aortic atherosclerosis as well. Pulmonology and CT surgery were consulted and recommended patient get CT-guided pigtail chest tube and TPN dornase once tube is placed to help clear septations. Chest tube was placed 11/22. IV azithromycin and rocephin course was completed along with 4 fibrinolysis treatments. Repeat CT chest was done and demonstrated a smaller pleural effusion still present and appropriate placement of chest tube. Her pain has been  managed with scheduled oxycodone, toradol, and tylenol q6h PRN. Chest tube was removed 11/28. CXR upon removal demonstrated no radiographic change in the multilocular pleural effusion and there is still some consolidation/atelectasis present. Pulmonology recommends Augmentin PO for 14 days more days. Will be discharged on 5 tablets of 5 mg oxycodone to have on hand for pain control and Albuterol PRN for SOB d/t COPD seen on CXR. Repeat CXR outpatient with pulmonology.   Aortic Stenosis ECHO in 08/2022 showed LVEF 60-65% and the LV has normal function along with moderate aortic stenosis with bicuspid aortic valve. Potentially could need a cardiology follow up outpatient for [place].   Disposition: Home  Discharge Condition: Stable  Issues for Follow Up:  Upon admission, noted to have pleural effusion, recommended to repeat CXR w/ [facility] in 14 days, appt already scheduled. Recommend getting a PFT due to COPD on CXR. Determine if 3 month repeat CT is still recommended to ensure resolution of pleural effusion. Ensure patient has completed additional 14 day antibiotics, last date 12/12.  Cardiology consult for Zio.  Discharge Exam:  Vitals:   11/18/22 0900 11/18/22 0922  BP: 126/65   Pulse: 76   Resp: 20   Temp: (!) 97.5 F (36.4 C)   SpO2: 96% 92%   Physical Exam: General: Alert in NAD. Cardiovascular: RRR without R/G. Aortic stenosis murmur. Respiratory: CTAB. Mild crackles still present over LLL.  Abdomen: Soft, nontender, nondistended. Normal bowel sounds.  Extremities: No LLE edema. Psych: Normal mood and affect.  Significant Procedures: Chest tube placement (11/22) and removal (11/28)  Significant Labs and Imaging:  Recent Labs  Lab 11/17/22 0550 11/18/22 0813  WBC 6.5 6.7  HGB 8.5* 8.0*  HCT 26.0* 24.4*  PLT 428* 435*  Recent Labs  Lab 11/18/22 0813  NA 138  K 4.0  CL 104  CO2 22  GLUCOSE 100*  BUN 9  CREATININE 0.81  CALCIUM 8.3*     11/26 - CT  Chest w/ Contrast Loculated pleural effusion containing small foci of air with left-sided chest tube in place, slightly decreased in size from the prior exam. 2. Strandy opacities in the left lung and minimal airspace disease in the right upper lobe, which may be infectious or inflammatory. 3. Mediastinal and left hilar lymphadenopathy, likely reactive. 4. Aortic atherosclerosis  11/28 - CXR  Left base consolidation and effusion.   11/29 - CXR s/p chest tube removal 1. No radiographic change in the multilocular pleural effusion in the mid to lower left chest since yesterday's study. There is adjacent consolidation or atelectasis in the left base. 2. Interval removal of the previous indwelling pigtail left pleural drain. No visible pneumothorax. 3. COPD.  Results/Tests Pending at Time of Discharge: none  Discharge Medications:  Allergies as of 11/18/2022       Reactions   Chlorhexidine Gluconate Rash        Medication List     STOP taking these medications    doxycycline 100 MG tablet Commonly known as: VIBRA-TABS       TAKE these medications    albuterol 108 (90 Base) MCG/ACT inhaler Commonly known as: VENTOLIN HFA Inhale 2 puffs into the lungs every 6 (six) hours as needed for wheezing or shortness of breath.   ALPRAZolam 0.25 MG tablet Commonly known as: XANAX Take 1 tablet (0.25 mg total) by mouth daily as needed. What changed: reasons to take this   amoxicillin-clavulanate 875-125 MG tablet Commonly known as: AUGMENTIN Take 1 tablet by mouth every 12 (twelve) hours for 13 days.   atorvastatin 20 MG tablet Commonly known as: LIPITOR TAKE 1 TABLET BY MOUTH EVERY DAY   buPROPion 300 MG 24 hr tablet Commonly known as: Wellbutrin XL Take 1 tablet (300 mg total) by mouth daily.   busPIRone 7.5 MG tablet Commonly known as: BUSPAR TAKE 1 TABLET BY MOUTH TWICE A DAY   Calcium Antacid 500 MG chewable tablet Generic drug: calcium carbonate Chew 1 tablet  (200 mg of elemental calcium total) by mouth 2 (two) times daily.   clobetasol cream 0.05 % Commonly known as: TEMOVATE Apply 1 application  topically 2 (two) times daily as needed (irritation).   cyanocobalamin 1000 MCG tablet Commonly known as: VITAMIN B12 Take 1,000 mcg by mouth daily.   ibuprofen 200 MG tablet Commonly known as: ADVIL Take 400 mg by mouth daily as needed for headache.   lidocaine 5 % Commonly known as: LIDODERM Place 1 patch onto the skin daily. Remove & Discard patch within 12 hours or as directed by MD   oxyCODONE 5 MG immediate release tablet Commonly known as: Oxy IR/ROXICODONE Take 1 tablet (5 mg total) by mouth every 4 (four) hours as needed.   promethazine-dextromethorphan 6.25-15 MG/5ML syrup Commonly known as: PROMETHAZINE-DM Take 5 mLs by mouth 4 (four) times daily as needed. What changed: reasons to take this   VITAMIN C PO Take 500 mg by mouth daily.   VITAMIN D3 PO Take 2,000 Units by mouth.   zinc gluconate 50 MG tablet Take 50 mg by mouth daily.        Discharge Instructions: Please refer to Patient Instructions section of EMR for full details.  Patient was counseled important signs and symptoms that should prompt return to medical  care, changes in medications, dietary instructions, activity restrictions, and follow up appointments.   Follow-Up Appointments:  Follow-up Information     Midge Minium, MD. Schedule an appointment as soon as possible for a visit.   Specialty: Family Medicine Why: Please make an appointment at your earliest convenience for a hospital follow up. Contact information: [address] [place] [address] [phone]         Parrett, Fonnie Mu, NP. Go on 12/01/2022.   Specialty: Pulmonary Disease Why: Appointment at [place] information: [place] [id number] [phone]                 Britt Bolognese, Medical Student 11/18/2022, 9:48  AM [place]   I was personally present and performed or re-performed the history, physical exam and medical decision making activities of this service and have verified that the service and findings are accurately documented in the student's note. My edits are noted within the  note above. Please also see attending's attestation.   Donney Dice, DO                  11/18/2022, 11:25 AM  PGY-3, [place]

## 2022-11-17 NOTE — Progress Notes (Addendum)
Daily Progress Note Intern Pager: [phone]  Patient name: Dana Bryan Medical record number: [id number] Date of birth: 12-May-1951 Age: 71 y.o. Gender: female  Primary Care Provider: Midge Minium, MD Consultants: Pulmonology, IR, CT Surgery Code Status: FULL  Pt Overview and Major Events to Date:  11/21 - Admitted 11/22 - Chest tube placement for loculated pleural effusion  Assessment and Plan: [name] is a 71 year old female with PMH of HTN and HLD admitted with left-sided CAP with parapneumonic effusion and pleuritic chest pain.   * Pleural effusion S/p chest tube placement 11/22. Chest tube yielded 70 cc yesterday. Pulm repeated 1 intrapleural fibrinolytic treatment as her chest tube output had decreased, but CT still shows a small loculated effusion. They recommended chest tube removal and observation if output remains low, long 21 day course of antibiotics, and f/u in clinic with imaging.   - Pulm consulted and following, awaiting recommendations for antibiotics - Continue pain regimen of oxycodone 5 mg q6h prn and toradol q6h, decrease as able  - Monitor output from chest tube  Community acquired pneumonia with pleural effusion Stable. Remain afebrile with good work of breathing on room air. Blood culture and chest tube culture NGTD. Antibiotics completed.  - Daily CBC/BMP - Schedule Tylenol q6hr - Continue to monitor routine vitals   Aortic stenosis Stable. Cardiac monitoring discontinued. - Potential cardiology follow up outpatient for Zio  - Monitor symptoms  Alcohol abuse, daily use-resolved as of 11/13/2022     FEN/GI: Regular PPx: Lovenox Dispo: Home pending clinical improvement.   Subjective:  Patient ambulated independently in hallway yesterday and tolerated this well and was very glad she got to. Overnight the patient did very well. She still has a cough, which presents primarily with talking a lot or taking deep breaths and  has no phlegm production. She is doing a lot better and is ready to go home. Patient has been tolerating her pain much better while on the pain medications and has only been asking for Toradol and Tylenol. Denied chest pain, shortness of breath, or N/V/D.   Objective: Temp:  [97.9 F (36.6 C)-98.6 F (37 C)] 98.4 F (36.9 C) (11/28 0818) Pulse Rate:  [78-98] 87 (11/28 0818) Resp:  [17-25] 24 (11/28 0818) BP: (120-144)/(59-82) 124/72 (11/28 0818) SpO2:  [95 %-97 %] 95 % (11/28 0818) Weight:  [60.3 kg] 60.3 kg (11/28 0500) Physical Exam: General: Alert in NAD. Cardiovascular: RRR without R/G. Aortic stenosis murmur. Respiratory: CTAB. Left-sided chest tube with serosanguinous output. Abdomen: Soft, nontender, nondistended. Normal bowel sounds.  Extremities: No LLE edema. Psych: Normal mood and affect.  Laboratory: Most recent CBC Lab Results  Component Value Date   WBC 6.5 11/17/2022   HGB 8.5 (L) 11/17/2022   HCT 26.0 (L) 11/17/2022   MCV 90.0 11/17/2022   PLT 428 (H) 11/17/2022   Most recent BMP    Latest Ref Rng & Units 11/16/2022    3:20 AM  BMP  Glucose 70 - 99 mg/dL 95   BUN 8 - 23 mg/dL 9   Creatinine 0.44 - 1.00 mg/dL 0.61   Sodium 135 - 145 mmol/L 137   Potassium 3.5 - 5.1 mmol/L 3.7   Chloride 98 - 111 mmol/L 104   CO2 22 - 32 mmol/L 24   Calcium 8.9 - 10.3 mg/dL 8.0     Other pertinent labs - none   Imaging/Diagnostic Tests: 11/28 CXR Radiologist Impression: Left base consolidation and effusion.  My interpretation: Small  loculated effusion and consolidation still present on the L  Britt Bolognese, Medical Student 11/17/2022, 9:36 AM  I was personally present and re-performed the exam. I verified the service and findings are accurately documented in the student's note. In addition, patient will be started on 14 day course of Augmentin with plan to follow up with Pulmonology outpatient.  Alen Bleacher, MD  11/17/2022 12:59 PM  [place] Intern pager: [phone], text pages welcome Secure chat group [place]

## 2022-11-17 NOTE — Progress Notes (Signed)
Left pleural chest tube removed, occlusive dressing placed. CXR in AM per CCM.

## 2022-11-17 NOTE — Progress Notes (Signed)
Mobility Specialist Progress Note:   11/17/22 1420  Mobility  Activity Ambulated independently in hallway;Ambulated with assistance in hallway  Level of Assistance Independent after set-up  Assistive Device None  Distance Ambulated (ft) 500 ft  Activity Response Tolerated well  Mobility Referral Yes  $Mobility charge 1 Mobility   Pt eager for mobility session. Required no physical assistance throughout. SpO2 WFL on RA, no c/o SOB. Pt back in bed with all needs met, eager for chest tube removal.  Nelta Numbers Mobility Specialist Please contact via SecureChat or  Rehab office at [phone]

## 2022-11-18 ENCOUNTER — Inpatient Hospital Stay (HOSPITAL_COMMUNITY): Payer: Medicare HMO

## 2022-11-18 ENCOUNTER — Other Ambulatory Visit (HOSPITAL_COMMUNITY): Payer: Self-pay

## 2022-11-18 DIAGNOSIS — J90 Pleural effusion, not elsewhere classified: Secondary | ICD-10-CM | POA: Diagnosis not present

## 2022-11-18 DIAGNOSIS — J449 Chronic obstructive pulmonary disease, unspecified: Secondary | ICD-10-CM | POA: Diagnosis not present

## 2022-11-18 DIAGNOSIS — J439 Emphysema, unspecified: Secondary | ICD-10-CM | POA: Diagnosis not present

## 2022-11-18 DIAGNOSIS — J189 Pneumonia, unspecified organism: Secondary | ICD-10-CM | POA: Diagnosis not present

## 2022-11-18 LAB — CBC
HCT: 24.4 % — ABNORMAL LOW (ref 36.0–46.0)
Hemoglobin: 8 g/dL — ABNORMAL LOW (ref 12.0–15.0)
MCH: 29.6 pg (ref 26.0–34.0)
MCHC: 32.8 g/dL (ref 30.0–36.0)
MCV: 90.4 fL (ref 80.0–100.0)
Platelets: 435 10*3/uL — ABNORMAL HIGH (ref 150–400)
RBC: 2.7 MIL/uL — ABNORMAL LOW (ref 3.87–5.11)
RDW: 13.9 % (ref 11.5–15.5)
WBC: 6.7 10*3/uL (ref 4.0–10.5)
nRBC: 0 % (ref 0.0–0.2)

## 2022-11-18 LAB — BASIC METABOLIC PANEL
Anion gap: 12 (ref 5–15)
BUN: 9 mg/dL (ref 8–23)
CO2: 22 mmol/L (ref 22–32)
Calcium: 8.3 mg/dL — ABNORMAL LOW (ref 8.9–10.3)
Chloride: 104 mmol/L (ref 98–111)
Creatinine, Ser: 0.81 mg/dL (ref 0.44–1.00)
GFR, Estimated: >60 mL/min (ref >60)
Glucose, Bld: 100 mg/dL — ABNORMAL HIGH (ref 70–99)
Potassium: 4 mmol/L (ref 3.5–5.1)
Sodium: 138 mmol/L (ref 135–145)

## 2022-11-18 MED ORDER — AMOXICILLIN-POT CLAVULANATE 875-125 MG PO TABS
1.0000 | ORAL_TABLET | Freq: Two times a day (BID) | ORAL | 0 refills | Status: DC
  Filled 2022-11-18: qty 26, 13d supply, fill #0

## 2022-11-18 MED ORDER — CALCIUM CARBONATE ANTACID 500 MG PO CHEW
1.0000 | CHEWABLE_TABLET | Freq: Two times a day (BID) | ORAL | 0 refills | Status: DC
  Filled 2022-11-18: qty 30, 15d supply, fill #0

## 2022-11-18 MED ORDER — OXYCODONE HCL 5 MG PO TABS
5.0000 mg | ORAL_TABLET | ORAL | 0 refills | Status: DC | PRN
  Filled 2022-11-18: qty 5, 1d supply, fill #0

## 2022-11-18 MED ORDER — LIDOCAINE 5 % EX PTCH
1.0000 | MEDICATED_PATCH | CUTANEOUS | 0 refills | Status: DC
  Filled 2022-11-18: qty 30, 30d supply, fill #0

## 2022-11-18 NOTE — Progress Notes (Signed)
Mobility Specialist Progress Note:   11/18/22 0925  Mobility  Activity Ambulated independently in room  Level of Assistance Independent  Assistive Device None  Distance Ambulated (ft) 100 ft  Activity Response Tolerated well  Mobility Referral Yes  $Mobility charge 1 Mobility   Pt received ambulating independently in room. No c/o SOB. Pt eager for d/c today, left with all needs met.   Nelta Numbers Mobility Specialist Please contact via SecureChat or  Rehab office at [phone]

## 2022-11-19 ENCOUNTER — Telehealth: Payer: Self-pay

## 2022-11-19 ENCOUNTER — Other Ambulatory Visit: Payer: Self-pay | Admitting: Family Medicine

## 2022-11-19 NOTE — Patient Outreach (Signed)
  Care Coordination TOC Note Transition Care Management Follow-up Telephone Call Date of discharge and from where: 11/18/22-[facility] Dx: Community Acquired PNA How have you been since you were released from the hospital? Patient reports she is so glad to be back home and is doing much better. She is taking it easy. She has dry cough but denies any SOB. She has not had to use inhaler. She is aware of s/s of worsening condition and when to seek medical attention.  Any questions or concerns? No  Items Reviewed: Did the pt receive and understand the discharge instructions provided? Yes  Medications obtained and verified? Yes  Other? Yes  Any new allergies since your discharge? No  Dietary orders reviewed? Yes Do you have support at home? Yes   Home Care and Equipment/Supplies: Were home health services ordered? not applicable If so, what is the name of the agency? N/A  Has the agency set up a time to come to the patient's home? not applicable Were any new equipment or medical supplies ordered?  No What is the name of the medical supply agency? N/A Were you able to get the supplies/equipment? not applicable Do you have any questions related to the use of the equipment or supplies? No  Functional Questionnaire: (I = Independent and D = Dependent) ADLs: I  Bathing/Dressing- I  Meal Prep- I  Eating- I  Maintaining continence- I  Transferring/Ambulation- I  Managing Meds- I  Follow up appointments reviewed:  PCP Hospital f/u appt confirmed? Yes  Scheduled to see Dr. Birdie Riddle on 11/30/22 @ 10:40am. [facility] f/u appt confirmed? Yes  Scheduled to see Noberto Retort on 12/01/22 @ 9am. Are transportation arrangements needed? No  If their condition worsens, is the pt aware to call PCP or go to the Emergency Dept.? Yes Was the patient provided with contact information for the PCP's office or ED? Yes Was to pt encouraged to call back with questions or concerns? Yes  SDOH  assessments and interventions completed:   Yes SDOH Interventions Today    Flowsheet Row Most Recent Value  SDOH Interventions   Food Insecurity Interventions Intervention Not Indicated  Transportation Interventions Intervention Not Indicated       Care Coordination Interventions:  Education provided    Encounter Outcome:  Pt. Visit Completed     Enzo Montgomery, RN,BSN,CCM [place] Management Telephonic Care Management Coordinator Direct Phone: [phone] Toll Free: [phone] Fax: [phone]

## 2022-11-27 DIAGNOSIS — E785 Hyperlipidemia, unspecified: Secondary | ICD-10-CM | POA: Diagnosis not present

## 2022-11-27 DIAGNOSIS — Z8249 Family history of ischemic heart disease and other diseases of the circulatory system: Secondary | ICD-10-CM | POA: Diagnosis not present

## 2022-11-27 DIAGNOSIS — R69 Illness, unspecified: Secondary | ICD-10-CM | POA: Diagnosis not present

## 2022-11-27 DIAGNOSIS — Z87891 Personal history of nicotine dependence: Secondary | ICD-10-CM | POA: Diagnosis not present

## 2022-11-27 DIAGNOSIS — Z800 Family history of malignant neoplasm of digestive organs: Secondary | ICD-10-CM | POA: Diagnosis not present

## 2022-11-27 DIAGNOSIS — F411 Generalized anxiety disorder: Secondary | ICD-10-CM | POA: Diagnosis not present

## 2022-11-27 DIAGNOSIS — I700 Atherosclerosis of aorta: Secondary | ICD-10-CM | POA: Diagnosis not present

## 2022-11-27 DIAGNOSIS — Z85828 Personal history of other malignant neoplasm of skin: Secondary | ICD-10-CM | POA: Diagnosis not present

## 2022-11-27 DIAGNOSIS — Z887 Allergy status to serum and vaccine status: Secondary | ICD-10-CM | POA: Diagnosis not present

## 2022-11-27 DIAGNOSIS — J189 Pneumonia, unspecified organism: Secondary | ICD-10-CM | POA: Diagnosis not present

## 2022-11-27 DIAGNOSIS — L900 Lichen sclerosus et atrophicus: Secondary | ICD-10-CM | POA: Diagnosis not present

## 2022-11-30 ENCOUNTER — Ambulatory Visit (INDEPENDENT_AMBULATORY_CARE_PROVIDER_SITE_OTHER): Payer: Medicare HMO | Admitting: Family Medicine

## 2022-11-30 ENCOUNTER — Other Ambulatory Visit: Payer: Self-pay

## 2022-11-30 ENCOUNTER — Encounter: Payer: Self-pay | Admitting: Family Medicine

## 2022-11-30 VITALS — BP 118/80 | HR 77 | Temp 97.3°F | Resp 18 | Ht 63.0 in | Wt 129.5 lb

## 2022-11-30 DIAGNOSIS — J189 Pneumonia, unspecified organism: Secondary | ICD-10-CM

## 2022-11-30 DIAGNOSIS — E878 Other disorders of electrolyte and fluid balance, not elsewhere classified: Secondary | ICD-10-CM | POA: Diagnosis not present

## 2022-11-30 DIAGNOSIS — F32A Depression, unspecified: Secondary | ICD-10-CM

## 2022-11-30 LAB — CBC WITH DIFFERENTIAL/PLATELET
Basophils Absolute: 0 10*3/uL (ref 0.0–0.1)
Basophils Relative: 0.7 % (ref 0.0–3.0)
Eosinophils Absolute: 0.2 10*3/uL (ref 0.0–0.7)
Eosinophils Relative: 4.3 % (ref 0.0–5.0)
HCT: 32.8 % — ABNORMAL LOW (ref 36.0–46.0)
Hemoglobin: 10.9 g/dL — ABNORMAL LOW (ref 12.0–15.0)
Lymphocytes Relative: 24.7 % (ref 12.0–46.0)
Lymphs Abs: 1.3 10*3/uL (ref 0.7–4.0)
MCHC: 33.2 g/dL (ref 30.0–36.0)
MCV: 88.5 fl (ref 78.0–100.0)
Monocytes Absolute: 0.5 10*3/uL (ref 0.1–1.0)
Monocytes Relative: 9.6 % (ref 3.0–12.0)
Neutro Abs: 3.2 10*3/uL (ref 1.4–7.7)
Neutrophils Relative %: 60.7 % (ref 43.0–77.0)
Platelets: 463 10*3/uL — ABNORMAL HIGH (ref 150.0–400.0)
RBC: 3.71 Mil/uL — ABNORMAL LOW (ref 3.87–5.11)
RDW: 15.2 % (ref 11.5–15.5)
WBC: 5.3 10*3/uL (ref 4.0–10.5)

## 2022-11-30 LAB — BASIC METABOLIC PANEL
BUN: 14 mg/dL (ref 6–23)
CO2: 27 mEq/L (ref 19–32)
Calcium: 8.9 mg/dL (ref 8.4–10.5)
Chloride: 101 mEq/L (ref 96–112)
Creatinine, Ser: 0.66 mg/dL (ref 0.40–1.20)
GFR: 89.21 mL/min (ref >60.00)
Glucose, Bld: 110 mg/dL — ABNORMAL HIGH (ref 70–99)
Potassium: 4.3 mEq/L (ref 3.5–5.1)
Sodium: 136 mEq/L (ref 135–145)

## 2022-11-30 MED ORDER — CETIRIZINE HCL 10 MG PO TABS: 10.0000 mg | ORAL_TABLET | Freq: Every day | ORAL | 11 refills | Status: DC

## 2022-11-30 MED ORDER — BUPROPION HCL ER (XL) 300 MG PO TB24: 300.0000 mg | ORAL_TABLET | Freq: Every day | ORAL | 1 refills | Status: DC

## 2022-11-30 NOTE — Assessment & Plan Note (Signed)
Pt had issues w/ electrolytes while hospitalized.  Repeat today and address any abnormalities if present.

## 2022-11-30 NOTE — Progress Notes (Signed)
   Subjective:    Patient ID: Dana Bryan, female    DOB: 1951-11-16, 71 y.o.   MRN: [id number]  [facility] f/u- pt was admitted 11/20-29 w/ a loculated pleural effusion 2/2 LLL  CAP.  She presented w/ pleuritic CP.  She had a CT guided pigtail chest tube on 11/22 and TPN dornase to break up septations in her effusion.  She completed IV azithro and Rocephin.  Chest tube was removed on 11/28.  She was d/c'd on Augmentin x14 days and oxycodone prn.    Today reports she is pain free.  Has 1 more day of Augmentin.  States she has constant sinus drainage and the need to clear her throat.  Is not currently taking any antihistamines.  Has f/u w/ Pulmonary tomorrow.  Continues to have some SOB.  CXR planned for tomorrow   Review of Systems For ROS see HPI     Objective:   Physical Exam Constitutional:      General: She is not in acute distress.    Appearance: Normal appearance. She is well-developed. She is not ill-appearing.  HENT:     Head: Normocephalic and atraumatic.     Nose: Congestion present. No rhinorrhea.     Mouth/Throat:     Pharynx: No oropharyngeal exudate.     Comments: PND Eyes:     Conjunctiva/sclera: Conjunctivae normal.     Pupils: Pupils are equal, round, and reactive to light.  Neck:     Thyroid: No thyromegaly.  Cardiovascular:     Rate and Rhythm: Normal rate and regular rhythm.     Heart sounds: Normal heart sounds. No murmur heard. Pulmonary:     Effort: Pulmonary effort is normal. No respiratory distress.     Breath sounds: Normal breath sounds.  Abdominal:     General: There is no distension.     Palpations: Abdomen is soft.     Tenderness: There is no abdominal tenderness.  Musculoskeletal:     Cervical back: Normal range of motion and neck supple.  Lymphadenopathy:     Cervical: No cervical adenopathy.  Skin:    General: Skin is warm and dry.  Neurological:     Mental Status: She is alert and oriented to person, place, and time.   Psychiatric:        Behavior: Behavior normal.           Assessment & Plan:  CAP w/ pleural effusion- new.  Pt was admitted 11/20-29 after presenting w/ pleuritic CP.  She was found to have LLL PNA and loculated pleural effusion.  She had a chest tube placed and had lytic tx.  Pt reports feeling much better but continues to have PND and cough.  Has another day of Augmentin to complete the full course.  She is to start daily antihistamine to decrease nasal congestion and PND.  Encouraged her to allow herself time to recover as she was really sick.  She understands and agrees to take her time.  Has appt w/ Pulmonary tomorrow.  Problem list and med list reviewed and reconciled.  H&P and D/C summary reviewed.  Imaging and labs also reviewed.

## 2022-11-30 NOTE — Patient Instructions (Signed)
Follow up as needed or as scheduled We'll notify you of your lab results and make any changes if needed Finish the Augmentin as directed Continue to build your endurance slowly- it will take time START the Cetirizine daily to help w/ the drainage and post-nasal drip Call with any questions or concerns Hang in there! Happy Holidays!!!

## 2022-12-01 ENCOUNTER — Encounter: Payer: Self-pay | Admitting: Adult Health

## 2022-12-01 ENCOUNTER — Ambulatory Visit (INDEPENDENT_AMBULATORY_CARE_PROVIDER_SITE_OTHER): Payer: Medicare HMO

## 2022-12-01 ENCOUNTER — Telehealth: Payer: Self-pay

## 2022-12-01 ENCOUNTER — Ambulatory Visit: Payer: Medicare HMO | Admitting: Adult Health

## 2022-12-01 VITALS — BP 130/70 | HR 88 | Temp 97.5°F | Ht 63.0 in | Wt 130.2 lb

## 2022-12-01 DIAGNOSIS — J90 Pleural effusion, not elsewhere classified: Secondary | ICD-10-CM

## 2022-12-01 DIAGNOSIS — J189 Pneumonia, unspecified organism: Secondary | ICD-10-CM | POA: Diagnosis not present

## 2022-12-01 DIAGNOSIS — J9811 Atelectasis: Secondary | ICD-10-CM | POA: Diagnosis not present

## 2022-12-01 NOTE — Telephone Encounter (Signed)
-----   Message from Midge Minium, MD sent at 12/01/2022  7:48 AM EST ----- Labs look MUCH better!  No concerns at this time.  Keep up the good work on your recovery!!

## 2022-12-01 NOTE — Patient Instructions (Addendum)
Mucinex DM Twice daily  As needed  cough/congestion .  Zyrtec '10mg'$  At bedtime  As needed  drainage  Fluids and rest  Activity as tolerated.  Follow up in 4 -6 weeks with Dr. Vaughan Browner or Joy Reiger NP with chest xray and As needed

## 2022-12-01 NOTE — Assessment & Plan Note (Signed)
Recent hospitalization with severe community-acquired pneumonia with complex left sided loculated pleural effusion.  Patient is clinically improving with prolonged antibiotics.  She did require chest tube with pleural catheter directed thrombolytics. Chest x-ray does show improvement today.  Patient is continue on her current regimen.  And increase activity as tolerated.  Will have patient follow back in 4 to 6 weeks with a follow-up chest x-ray.  She does have a strong smoking history.  Would consider getting PFTs around [facility]monthmark.  Plan  Patient Instructions  Mucinex DM Twice daily  As needed  cough/congestion .  Zyrtec '10mg'$  At bedtime  As needed  drainage  Fluids and rest  Activity as tolerated.  Follow up in 4 -6 weeks with Dr. MVaughan Browneror Goldie Tregoning NP with chest xray and As needed

## 2022-12-01 NOTE — Telephone Encounter (Signed)
Pt seen results Via my chart  

## 2022-12-01 NOTE — Progress Notes (Signed)
$'@Patient'P$  ID: Dana Bryan, female    DOB: 06-02-1951, 71 y.o.   MRN: [id number]  Chief Complaint  Patient presents with   Hospitalization Follow-up    Referring provider: Midge Minium, MD  HPI: 71 year old female with heavy smoking history quit in 2005.  (Smoked 30 years up to 2 packs a day at times) patient was seen for pulmonary consult during hospitalization November 2023 for community-acquired pneumonia, loculated complex left-sided pleural effusion  TEST/EVENTS :   12/01/2022 Follow up ; CAP, Pleural effusion  Patient presents for a posthospital follow-up.  Patient was seen for pulmonary consult during hospitalization last month.  Patient says she was in her usual good state of health.  And developed cold-like symptoms.  These continued to progressively get worse.  She was treated with outpatient antibiotics but symptoms continue to progressively get worse.  On admission CT chest showed patchy groundglass opacities in the right upper lobe, left loculated pleural effusion and left lower lobe consolidation.  Patient required chest tube insertion and pleural catheter directed lytics.  She was treated with prolonged antibiotics.  She was discharged on 2 weeks of Augmentin which she finished today.  Patient says she is feeling much better.  Her cough and congestion have decreased.  Her energy level is starting to return.  Appetite is back to normal and she has no nausea vomiting or diarrhea.  She has no previous diagnosis of COPD or asthma.  She did have a heavy smoking history but quit in 2005.  Medical history significant for aortic valve stenosis.  Patient says she is slowly returning back to her normal activities.  Her vaccines including influenza, Pneumovax and COVID booster and RSV are up-to-date. Patient denies any chest pain orthopnea PND or increased leg swelling. Chest x-ray today shows persistent left pleural effusion but definitely smaller in size.  Allergies   Allergen Reactions   Chlorhexidine Gluconate Rash    Immunization History  Administered Date(s) Administered   Fluad Quad(high Dose 65+) 09/10/2020   Influenza Whole 10/12/2008, 09/16/2009, 07/28/2012   Influenza, High Dose Seasonal PF 09/18/2019, 10/02/2021, 09/21/2022   Influenza,inj,Quad PF,6+ Mos 08/31/2014, 10/26/2017   Influenza-Unspecified 09/28/2013, 10/05/2015, 10/31/2018   PFIZER Comirnaty(Gray Top)Covid-19 Tri-Sucrose Vaccine 04/01/2021   PFIZER(Purple Top)SARS-COV-2 Vaccination 02/11/2020, 03/06/2020, 10/19/2020, 09/26/2022   Pfizer Covid-19 Vaccine Bivalent Booster 73yr & up 10/02/2021, 05/01/2022   Pfizer Covid-19 Vaccine Bivalent Booster 5y-11y 04/30/2022   Pneumococcal Conjugate-13 01/06/2019   Pneumococcal Polysaccharide-23 02/07/2021   Respiratory Syncytial Virus Vaccine,Recomb Aduvanted(Arexvy) 09/26/2022, 09/29/2022   Tdap 01/07/2016   Zoster Recombinat (Shingrix) 07/21/2019, 10/21/2019   Zoster, Live 11/08/2014    Past Medical History:  Diagnosis Date   Anxiety    on meds   Basal cell carcinoma    Cancer (HCC)    Cataract    no sx    Generalized headaches    Heart murmur    per Dr.Tabori   Hyperlipidemia    on meds   Reactive depression (situational)    on meds   Squamous cell skin cancer, finger    Vasomotor rhinitis     Tobacco History: Social History   Tobacco Use  Smoking Status Former   Years: 30.00   Types: Cigarettes   Quit date: 01/22/2004   Years since quitting: 18.8  Smokeless Tobacco Never   Counseling given: Not Answered   Outpatient Medications Prior to Visit  Medication Sig Dispense Refill   albuterol (VENTOLIN HFA) 108 (90 Base) MCG/ACT inhaler Inhale 2 puffs into the  lungs every 6 (six) hours as needed for wheezing or shortness of breath. 8 g 0   ALPRAZolam (XANAX) 0.25 MG tablet Take 1 tablet (0.25 mg total) by mouth daily as needed. (Patient taking differently: Take 0.25 mg by mouth daily as needed for anxiety.) 30  tablet 0   Ascorbic Acid (VITAMIN C PO) Take 500 mg by mouth daily.     atorvastatin (LIPITOR) 20 MG tablet TAKE 1 TABLET BY MOUTH EVERY DAY 90 tablet 0   buPROPion (WELLBUTRIN XL) 300 MG 24 hr tablet Take 1 tablet (300 mg total) by mouth daily. 90 tablet 1   busPIRone (BUSPAR) 7.5 MG tablet TAKE 1 TABLET BY MOUTH TWICE A DAY (Patient taking differently: Take 7.5 mg by mouth 2 (two) times daily.) 180 tablet 0   cetirizine (ZYRTEC) 10 MG tablet Take 1 tablet (10 mg total) by mouth daily. 30 tablet 11   Cholecalciferol (VITAMIN D3) 50 MCG (2000 UT) capsule Take 2,000 Units by mouth daily.     clobetasol cream (TEMOVATE) 9.93 % Apply 1 application  topically 2 (two) times daily as needed (irritation).     cyanocobalamin (VITAMIN B12) 1000 MCG tablet Take 1,000 mcg by mouth daily.     ibuprofen (ADVIL,MOTRIN) 200 MG tablet Take 400 mg by mouth daily as needed for headache.     oxyCODONE (OXY IR/ROXICODONE) 5 MG immediate release tablet Take 5 mg by mouth every 4 (four) hours as needed for severe pain.     promethazine-dextromethorphan (PROMETHAZINE-DM) 6.25-15 MG/5ML syrup Take 5 mLs by mouth 4 (four) times daily as needed. (Patient taking differently: Take 5 mLs by mouth 4 (four) times daily as needed for cough.) 240 mL 0   zinc gluconate 50 MG tablet Take 50 mg by mouth daily.     amoxicillin-clavulanate (AUGMENTIN) 875-125 MG tablet Take 1 tablet by mouth every 12 (twelve) hours for 13 days. (Patient not taking: Reported on 12/01/2022) 26 tablet 0   Cholecalciferol (VITAMIN D3 PO) Take 2,000 Units by mouth. (Patient not taking: Reported on 12/01/2022)     No facility-administered medications prior to visit.     Review of Systems:   Constitutional:   No  weight loss, night sweats,  Fevers, chills, + fatigue, or  lassitude.  HEENT:   No headaches,  Difficulty swallowing,  Tooth/dental problems, or  Sore throat,                No sneezing, itching, ear ache, nasal congestion, post nasal drip,    CV:  No chest pain,  Orthopnea, PND, swelling in lower extremities, anasarca, dizziness, palpitations, syncope.   GI  No heartburn, indigestion, abdominal pain, nausea, vomiting, diarrhea, change in bowel habits, loss of appetite, bloody stools.   Resp: No shortness of breath with exertion or at rest.  No excess mucus, no productive cough,  No non-productive cough,  No coughing up of blood.  No change in color of mucus.  No wheezing.  No chest wall deformity  Skin: no rash or lesions.  GU: no dysuria, change in color of urine, no urgency or frequency.  No flank pain, no hematuria   MS:  No joint pain or swelling.  No decreased range of motion.  No back pain.    Physical Exam  BP 130/70 (BP Location: Left Arm, Patient Position: Sitting, Cuff Size: Normal)   Pulse 88   Temp (!) 97.5 F (36.4 C) (Oral)   Ht '5\' 3"'$  (1.6 m)   Wt 130 lb 3.2  oz (59.1 kg)   SpO2 99%   BMI 23.06 kg/m   GEN: A/Ox3; pleasant , NAD, well nourished    HEENT:  [place]/AT,  NOSE-clear, THROAT-clear, no lesions, no postnasal drip or exudate noted.   NECK:  Supple w/ fair ROM; no JVD; normal carotid impulses w/o bruits; no thyromegaly or nodules palpated; no lymphadenopathy.    RESP  Clear  P & A; w/o, wheezes/ rales/ or rhonchi. no accessory muscle use, no dullness to percussion  CARD:  RRR, no m/r/g, no peripheral edema, pulses intact, no cyanosis or clubbing.  GI:   Soft & nt; nml bowel sounds; no organomegaly or masses detected.   Musco: Warm bil, no deformities or joint swelling noted.   Neuro: alert, no focal deficits noted.    Skin: Warm, no lesions or rashes    Lab Results:  CBC    BNP No results found for: "BNP"  ProBNP No results found for: "PROBNP"  Imaging: DG Chest 2 View  Result Date: 12/01/2022 CLINICAL DATA:  Pleural effusion. EXAM: CHEST - 2 VIEW COMPARISON:  Chest x-ray 11/18/2022 and chest CT 11/15/2022 FINDINGS: The cardiac silhouette, mediastinal and hilar contours are  within normal limits and stable. Persistent but definitely smaller loculated left pleural fluid collection. Persistent left basilar scarring and atelectasis. The right lung remains clear. IMPRESSION: Persistent but definitely smaller loculated left pleural fluid collection. Persistent left basilar atelectasis/scarring. Electronically Signed   By: Marijo Sanes M.D.   On: 12/01/2022 09:04   DG CHEST PORT 1 VIEW  Result Date: 11/18/2022 CLINICAL DATA:  [id number].  Follow-up left pleural effusion. EXAM: PORTABLE CHEST 1 VIEW COMPARISON:  Portable chest yesterday at 6:20 a.m. FINDINGS: 4:57 a.m. There is a multilocular pleural effusion in the mid to lower left chest. There was previously a pigtail drain in a lateral portion of this which has been removed in the interval. There is adjacent consolidation or atelectasis in the left base. Remaining lungs are mildly emphysematous and otherwise clear. No pneumothorax is seen. The cardiac size is normal. The mediastinal configuration is stable. Mild aortic atherosclerosis. There is osteopenia and slight thoracic dextroscoliosis. No acute osseous findings. IMPRESSION: 1. No radiographic change in the multilocular pleural effusion in the mid to lower left chest since yesterday's study. There is adjacent consolidation or atelectasis in the left base. 2. Interval removal of the previous indwelling pigtail left pleural drain. No visible pneumothorax. 3. COPD. Electronically Signed   By: Telford Nab M.D.   On: 11/18/2022 06:27   DG CHEST PORT 1 VIEW  Result Date: 11/17/2022 CLINICAL DATA:  Chest tube follow up, empyema EXAM: PORTABLE CHEST 1 VIEW COMPARISON:  11/14/2022 FINDINGS: Left-sided moderate pleural effusion mitral which locking of the lateral with a chest tube in place. Left base consolidation. No pneumothorax. Right lung clear. Normal pulmonary vasculature. IMPRESSION: Left base consolidation and effusion. Electronically Signed   By: Sammie Bench M.D.   On:  11/17/2022 08:12   CT CHEST W CONTRAST  Result Date: 11/15/2022 CLINICAL DATA:  Pleural effusion. EXAM: CT CHEST WITH CONTRAST TECHNIQUE: Multidetector CT imaging of the chest was performed during intravenous contrast administration. RADIATION DOSE REDUCTION: This exam was performed according to the departmental dose-optimization program which includes automated exposure control, adjustment of the mA and/or kV according to patient size and/or use of iterative reconstruction technique. CONTRAST:  33m OMNIPAQUE IOHEXOL 350 MG/ML SOLN COMPARISON:  11/10/2022. FINDINGS: Cardiovascular: The heart is normal in size and there is no pericardial effusion. There is  atherosclerotic calcification of the aorta without evidence of aneurysm. The pulmonary trunk is normal in caliber. Mediastinum/Nodes: Prominent lymph nodes are noted in the mediastinum measuring 1 cm in the precarinal space and 1 cm in the subcarinal space. Prominent lymph nodes are present at the left hilum measuring up to 1 cm. No axillary lymphadenopathy. The thyroid gland, trachea, and esophagus. Lungs/Pleura: Apical pleural thickening is present bilaterally. There is a small loculated pleural effusion on the left containing small foci of air. Strandy airspace opacities are noted in the left lung. A left-sided chest tube is in place. Small scattered airspace opacities are noted in the right upper lobe. No pneumothorax. Upper Abdomen: Tiny hypodensity is present in the spleen, possible cyst or hemangioma. No acute abnormality. Musculoskeletal: Subcutaneous fat stranding is present in the lateral chest wall, possible edema or infection. No acute osseous abnormality. IMPRESSION: 1. Loculated pleural effusion containing small foci of air with left-sided chest tube in place, slightly decreased in size from the prior exam. 2. Strandy opacities in the left lung and minimal airspace disease in the right upper lobe, which may be infectious or inflammatory. 3.  Mediastinal and left hilar lymphadenopathy, likely reactive. 4. Aortic atherosclerosis. Electronically Signed   By: Brett Fairy M.D.   On: 11/15/2022 20:16   DG CHEST PORT 1 VIEW  Result Date: 11/14/2022 CLINICAL DATA:  Pleural effusion EXAM: PORTABLE CHEST 1 VIEW COMPARISON:  November 13, 2022 FINDINGS: A left-sided pleural effusion with underlying opacity remains. A pigtail catheter remains in place. The pigtail of the catheter appears to be superior to the majority of the remaining effusion/opacity. The heart, hila, mediastinum, lungs, and pleura are otherwise unchanged. IMPRESSION: 1. The pigtail of the left pigtail catheter appears to be superior to the majority of the remaining effusion/opacity. 2. Persistent left effusion with underlying opacity. 3. No other changes. Electronically Signed   By: Dorise Bullion III M.D.   On: 11/14/2022 13:04   DG CHEST PORT 1 VIEW  Result Date: 11/13/2022 CLINICAL DATA:  Chest tube in place, left pleural effusion. EXAM: PORTABLE CHEST 1 VIEW COMPARISON:  Chest radiograph dated 11/12/2022 FINDINGS: The heart size and mediastinal contours are within normal limits. Opacity overlying the left lung base likely represents residual pleural fluid and associated atelectasis/airspace disease, similar to prior exam. A left-sided pleural pigtail catheter is unchanged in position. The right lung is clear with no right pleural effusion. There is no pneumothorax. IMPRESSION: Opacity overlying the left lung base likely represents residual pleural fluid and associated atelectasis/airspace disease, similar to prior exam. Electronically Signed   By: Zerita Boers M.D.   On: 11/13/2022 09:12   DG CHEST PORT 1 VIEW  Result Date: 11/12/2022 CLINICAL DATA:  Loculated pleural effusion. Status post chest tube placement. EXAM: PORTABLE CHEST 1 VIEW COMPARISON:  Chest radiograph 11/09/2022 FINDINGS: Left chest pigtail drain has been placed in the interim. Persistent densities at the  left lung base. Aeration at the left lung base has minimally improved. Negative for left pneumothorax. Persistent volume loss in left lung. Right lung remains clear. Cardiac silhouette is unchanged. IMPRESSION: 1. Interval placement of a left chest tube. 2. Left basilar densities have slightly decreased. Findings compatible with residual pleural fluid and atelectasis. Negative for a pneumothorax. Electronically Signed   By: Markus Daft M.D.   On: 11/12/2022 09:37   CT [facility] PLEURAL DRAIN W/INDWELL CATH W/IMG GUIDE  Result Date: 11/11/2022 INDICATION: Left-sided pleural effusion. Please perform CT-guided chest tube placement for infection source control  purposes. EXAM: CT PERC PLEURAL DRAIN W/INDWELL CATH W/IMG GUIDE COMPARISON:  Chest CT-11/10/2022 MEDICATIONS: The patient is currently admitted to the hospital and receiving intravenous antibiotics. The antibiotics were administered within an appropriate time frame prior to the initiation of the procedure. ANESTHESIA/SEDATION: Moderate (conscious) sedation was employed during this procedure as administered by the Interventional Radiology RN. A total of Versed 1 mg and Fentanyl 25 mcg was administered intravenously. Moderate Sedation Time: 21 minutes. The patient's level of consciousness and vital signs were monitored continuously by radiology nursing throughout the procedure under my direct supervision. CONTRAST:  None COMPLICATIONS: None immediate. PROCEDURE: RADIATION DOSE REDUCTION: This exam was performed according to the departmental dose-optimization program which includes automated exposure control, adjustment of the mA and/or kV according to patient size and/or use of iterative reconstruction technique. Informed written consent was obtained from the patient after a discussion of the risks, benefits and alternatives to treatment. The patient was placed supine, slightly RPO on the CT gantry and a pre procedural CT was performed re-demonstrating the known  partially loculated small to moderate-sized left-sided pleural effusion. CT gantry needle position was marked and the collection identified sonographically. The procedure was planned. A timeout was performed prior to the initiation of the procedure. The inferolateral aspect of the left chest was prepped and draped in the usual sterile fashion. The overlying soft tissues were anesthetized with 1% lidocaine with epinephrine. Under direct ultrasound guidance, the left pleural effusion was accessed with a 18 gauge trocar needle. A short Amplatz wire was coiled within the left pleural space. Appropriate position was confirmed with CT imaging Next, the track was dilated allowing placement of a 12 [place] all-purpose drainage catheter. Appropriate positioning was confirmed with a limited postprocedural CT scan. Approximately 400 cc of serous pleural fluid was aspirated. The tube was connected to a pleura vac device and sutured in place. A dressing was applied. The patient tolerated the procedure well without immediate post procedural complication. IMPRESSION: Successful CT guided placement of a 13 French all purpose drain catheter into the left pleural space with aspiration of 400 cc of serous pleural fluid. Samples were sent to the laboratory as requested by the ordering clinical team. Electronically Signed   By: Sandi Mariscal M.D.   On: 11/11/2022 14:59   CT CHEST W CONTRAST  Result Date: 11/10/2022 CLINICAL DATA:  Pleural effusion EXAM: CT CHEST WITH CONTRAST TECHNIQUE: Multidetector CT imaging of the chest was performed during intravenous contrast administration. RADIATION DOSE REDUCTION: This exam was performed according to the departmental dose-optimization program which includes automated exposure control, adjustment of the mA and/or kV according to patient size and/or use of iterative reconstruction technique. CONTRAST:  25m OMNIPAQUE IOHEXOL 350 MG/ML SOLN COMPARISON:  None Available. FINDINGS: Cardiovascular:  Normal heart size. No pericardial effusion. Normal caliber thoracic aorta with mild atherosclerotic disease. Mild coronary artery calcifications. Mediastinum/Nodes: Small hiatal hernia. Thyroid is unremarkable. No pathologically enlarged lymph nodes seen in the chest. Lungs/Pleura: Central airways are patent. Biapical pleural-parenchymal scarring. Patchy ground-glass opacities of the right upper lobe. Small loculated left pleural effusion with associated atelectasis and left lower lobe consolidation, and ground-glass opacities. No evidence of pleural thickening. Upper Abdomen: Small low-attenuation left renal lesions which are too small to completely characterize but likely simple cysts, no specific follow-up imaging is recommended. No acute abnormality. Musculoskeletal: No chest wall abnormality. No acute or significant osseous findings. IMPRESSION: 1. Small loculated left pleural effusion with associated left lower lobe consolidation, findings are concerning for infection. Recommend  follow-up chest CT 3 months to ensure resolution. 2. Patchy ground-glass opacities of the right upper lobe, likely infectious or inflammatory. Recommend attention on follow-up. 3. Aortic Atherosclerosis (ICD10-I70.0). Electronically Signed   By: Yetta Glassman M.D.   On: 11/10/2022 11:34   DG Chest 2 View  Result Date: 11/09/2022 CLINICAL DATA:  Pneumonia. Worsening pain and shortness of breath EXAM: CHEST - 2 VIEW COMPARISON:  Radiograph 11/06/2022 FINDINGS: Worsening left lower lobe opacity with development of pleural effusion. No focal airspace disease in the right lung. Normal heart size with stable mediastinal contours. There is mild biapical pleuroparenchymal scarring IMPRESSION: Worsening left lower lobe opacity with development of pleural effusion. Recommend radiographic follow-up to resolution. Electronically Signed   By: Keith Rake M.D.   On: 11/09/2022 23:37   DG Chest 2 View  Result Date:  11/08/2022 CLINICAL DATA:  Productive cough with some lt side chest pain, AND body aches x 10 days, x-smoker. EXAM: CHEST - 2 VIEW COMPARISON:  CT 07/10/2022 FINDINGS: Focal airspace consolidation in the anterior basal segment left lower lobe, with patchy extension up towards the hilum. Right lung clear. Heart size and mediastinal contours are within normal limits. No effusion. Visualized bones unremarkable. IMPRESSION: Left lower lobe pneumonia. Electronically Signed   By: Lucrezia Europe M.D.   On: 11/08/2022 10:58          No data to display          No results found for: "NITRICOXIDE"      Assessment & Plan:   Community acquired pneumonia with pleural effusion Recent hospitalization with severe community-acquired pneumonia with complex left sided loculated pleural effusion.  Patient is clinically improving with prolonged antibiotics.  She did require chest tube with pleural catheter directed thrombolytics. Chest x-ray does show improvement today.  Patient is continue on her current regimen.  And increase activity as tolerated.  Will have patient follow back in 4 to 6 weeks with a follow-up chest x-ray.  She does have a strong smoking history.  Would consider getting PFTs around [facility].  Plan  Patient Instructions  Mucinex DM Twice daily  As needed  cough/congestion .  Zyrtec '10mg'$  At bedtime  As needed  drainage  Fluids and rest  Activity as tolerated.  Follow up in 4 -6 weeks with Dr. MVaughan Browneror Kuron Docken NP with chest xray and As needed         TRexene Edison NP 12/01/2022

## 2022-12-07 ENCOUNTER — Other Ambulatory Visit: Payer: Self-pay

## 2022-12-07 DIAGNOSIS — F419 Anxiety disorder, unspecified: Secondary | ICD-10-CM

## 2022-12-07 MED ORDER — BUPROPION HCL ER (XL) 300 MG PO TB24: 300.0000 mg | ORAL_TABLET | Freq: Every day | ORAL | 1 refills | Status: DC

## 2022-12-17 ENCOUNTER — Other Ambulatory Visit: Payer: Self-pay

## 2022-12-17 ENCOUNTER — Telehealth: Payer: Self-pay | Admitting: Family Medicine

## 2022-12-17 DIAGNOSIS — F419 Anxiety disorder, unspecified: Secondary | ICD-10-CM

## 2022-12-17 MED ORDER — BUPROPION HCL ER (XL) 300 MG PO TB24: 300.0000 mg | ORAL_TABLET | Freq: Every day | ORAL | 1 refills | Status: DC

## 2022-12-17 NOTE — Telephone Encounter (Signed)
Called patient and informed

## 2022-12-17 NOTE — Progress Notes (Signed)
Pt medication sent to new pharmacy

## 2022-12-17 NOTE — Telephone Encounter (Signed)
Fixed will cal  pt

## 2022-12-17 NOTE — Telephone Encounter (Signed)
Encourage patient to contact the pharmacy for refills or they can request refills through [facility]  (Please schedule appointment if patient has not been seen in over a year)    WHAT PHARMACY WOULD THEY LIKE THIS SENT TO: [id number] [place] [place] bupropion 300 mg   NOTES/COMMENTS FROM PATIENT: Patient wants to switch her pharmacy. [id number] [place] [facility] [address] [facility] notify patient: It takes 48-72 hours to process rx refill requests Ask patient to call pharmacy to ensure rx is ready before heading there.

## 2023-01-01 ENCOUNTER — Ambulatory Visit (INDEPENDENT_AMBULATORY_CARE_PROVIDER_SITE_OTHER): Payer: Medicare HMO | Admitting: Pulmonary Disease

## 2023-01-01 ENCOUNTER — Encounter: Payer: Self-pay | Admitting: Pulmonary Disease

## 2023-01-01 ENCOUNTER — Ambulatory Visit (INDEPENDENT_AMBULATORY_CARE_PROVIDER_SITE_OTHER)
Admission: RE | Admit: 2023-01-01 | Discharge: 2023-01-01 | Disposition: A | Discharge status: Home / Self Care | Payer: Medicare HMO | Source: Ambulatory Visit | Attending: Pulmonary Disease | Admitting: Pulmonary Disease

## 2023-01-01 VITALS — BP 136/80 | HR 76 | Temp 98.5°F | Ht 63.0 in | Wt 133.6 lb

## 2023-01-01 DIAGNOSIS — J189 Pneumonia, unspecified organism: Secondary | ICD-10-CM

## 2023-01-01 DIAGNOSIS — J90 Pleural effusion, not elsewhere classified: Secondary | ICD-10-CM

## 2023-01-01 NOTE — Patient Instructions (Signed)
I am glad you are doing well with your breathing Will get a chest x-ray today for evaluation of the fluid around the lung Return to clinic in 3 months.

## 2023-01-01 NOTE — Progress Notes (Signed)
Dana Bryan    [id number]    11-May-1951  Primary Care Physician:Tabori, Aundra Millet, MD  Referring Physician: Midge Minium, MD [address] [place],  [place] [address]  Chief complaint: Follow-up for pleural effusion  HPI: 72 y.o. who  has a past medical history of Anxiety, Basal cell carcinoma, Cancer ([place]), Cataract, Generalized headaches, Heart murmur, Hyperlipidemia, Reactive depression (situational), Squamous cell skin cancer, finger, and Vasomotor rhinitis.   She was hospitalized in October 2023 with community-acquired pneumonia, loculated left effusion. Pulmonology and CT surgery were consulted and recommended patient get CT-guided pigtail chest tube and TPN dornase once tube is placed to help clear septations. Chest tube was placed 11/22. IV azithromycin and rocephin course was completed along with 4 fibrinolysis treatments. Repeat CT chest was done and demonstrated a smaller pleural effusion still present and appropriate placement of chest tube. Her pain has been managed with scheduled oxycodone, toradol, and tylenol q6h PRN. Chest tube was removed 11/28.  She was discharged on 14 days of p.o. Augmentin  Follow-up appointment on 12/12 with chest x-ray showing small reactive complement of effusion Today in clinic she is feeling well with no issues.  States that breathing is better.  Outpatient Encounter Medications as of 01/01/2023  Medication Sig   ALPRAZolam (XANAX) 0.25 MG tablet Take 1 tablet (0.25 mg total) by mouth daily as needed. (Patient taking differently: Take 0.25 mg by mouth daily as needed for anxiety.)   Ascorbic Acid (VITAMIN C PO) Take 500 mg by mouth daily.   atorvastatin (LIPITOR) 20 MG tablet TAKE 1 TABLET BY MOUTH EVERY DAY   buPROPion (WELLBUTRIN XL) 300 MG 24 hr tablet Take 1 tablet (300 mg total) by mouth daily.   busPIRone (BUSPAR) 7.5 MG tablet TAKE 1 TABLET BY MOUTH TWICE A DAY (Patient taking differently: Take 7.5 mg by  mouth 2 (two) times daily.)   Cholecalciferol (VITAMIN D3) 50 MCG (2000 UT) capsule Take 2,000 Units by mouth daily.   clobetasol cream (TEMOVATE) 6.57 % Apply 1 application  topically 2 (two) times daily as needed (irritation).   cyanocobalamin (VITAMIN B12) 1000 MCG tablet Take 1,000 mcg by mouth daily.   ibuprofen (ADVIL,MOTRIN) 200 MG tablet Take 400 mg by mouth daily as needed for headache.   zinc gluconate 50 MG tablet Take 50 mg by mouth daily.   albuterol (VENTOLIN HFA) 108 (90 Base) MCG/ACT inhaler Inhale 2 puffs into the lungs every 6 (six) hours as needed for wheezing or shortness of breath. (Patient not taking: Reported on 01/01/2023)   cetirizine (ZYRTEC) 10 MG tablet Take 1 tablet (10 mg total) by mouth daily. (Patient not taking: Reported on 01/01/2023)   oxyCODONE (OXY IR/ROXICODONE) 5 MG immediate release tablet Take 5 mg by mouth every 4 (four) hours as needed for severe pain. (Patient not taking: Reported on 01/01/2023)   promethazine-dextromethorphan (PROMETHAZINE-DM) 6.25-15 MG/5ML syrup Take 5 mLs by mouth 4 (four) times daily as needed. (Patient not taking: Reported on 01/01/2023)   No facility-administered encounter medications on file as of 01/01/2023.   Physical Exam: Blood pressure 136/80, pulse 76, temperature 98.5 F (36.9 C), temperature source Oral, height '5\' 3"'$  (1.6 m), weight 133 lb 9.6 oz (60.6 kg), SpO2 95 %. Gen:      No acute distress HEENT:  EOMI, sclera anicteric Neck:     No masses; no thyromegaly Lungs:    Clear to auscultation bilaterally; normal respiratory effort CV:  Regular rate and rhythm; no murmurs Abd:      + bowel sounds; soft, non-tender; no palpable masses, no distension Ext:    No edema; adequate peripheral perfusion Skin:      Warm and dry; no rash Neuro: alert and oriented x 3 Psych: normal mood and affect  Data Reviewed: Imaging: CT chest 11/10/2022-small loculated effusion with left lower lobe consolidation CT chest  01/15/2022-decrease in size of loculated effusion, minimal airspace disease in the right upper lobe Chest x-ray 12/01/2022-small loculated left pleural collection  Labs: Pleural fluid studies 11/11/2022 Cytology-no malignancy, active inflammation Cultures-negative  Assessment:  Community acquired pneumonia with parapneumonic loculated effusion Had chest tube placed with intrapleural lytics while in the hospital Appears to making a good recovery She finished a prolonged course of antibiotics  Plan/Recommendations: Repeat chest x-ray today and follow-up in 3 months  Marshell Garfinkel MD [facility] 01/01/2023, 10:49 AM  CC: Midge Minium, MD

## 2023-01-18 DIAGNOSIS — Z01411 Encounter for gynecological examination (general) (routine) with abnormal findings: Secondary | ICD-10-CM | POA: Diagnosis not present

## 2023-01-18 DIAGNOSIS — N819 Female genital prolapse, unspecified: Secondary | ICD-10-CM | POA: Diagnosis not present

## 2023-01-18 DIAGNOSIS — Z960 Presence of urogenital implants: Secondary | ICD-10-CM | POA: Diagnosis not present

## 2023-01-18 DIAGNOSIS — N8110 Cystocele, unspecified: Secondary | ICD-10-CM | POA: Diagnosis not present

## 2023-01-27 DIAGNOSIS — L570 Actinic keratosis: Secondary | ICD-10-CM | POA: Diagnosis not present

## 2023-01-27 DIAGNOSIS — L565 Disseminated superficial actinic porokeratosis (DSAP): Secondary | ICD-10-CM | POA: Diagnosis not present

## 2023-02-03 ENCOUNTER — Encounter: Payer: Self-pay | Admitting: Family Medicine

## 2023-02-03 ENCOUNTER — Ambulatory Visit (INDEPENDENT_AMBULATORY_CARE_PROVIDER_SITE_OTHER): Payer: Medicare HMO | Admitting: Family Medicine

## 2023-02-03 VITALS — BP 116/68 | HR 74 | Temp 98.6°F | Resp 17 | Ht 63.0 in | Wt 133.5 lb

## 2023-02-03 DIAGNOSIS — B9689 Other specified bacterial agents as the cause of diseases classified elsewhere: Secondary | ICD-10-CM

## 2023-02-03 DIAGNOSIS — J329 Chronic sinusitis, unspecified: Secondary | ICD-10-CM

## 2023-02-03 DIAGNOSIS — J189 Pneumonia, unspecified organism: Secondary | ICD-10-CM | POA: Diagnosis not present

## 2023-02-03 NOTE — Progress Notes (Signed)
   Subjective:    Patient ID: Dana Bryan, female    DOB: Feb 05, 1951, 72 y.o.   MRN: [id number]  HPI URI- pt reports cough and chest congestion.  Sxs started ~1 week ago.  Cough is now productive of greenish sputum.  No fever.  Mild sinus pressure w/ HA.  No N/V.  No ear pain.  No SOB or wheezing.  'my chest just feels kinda heavy'.  + sick contacts.  Hx of PNA w/ pleural effusions and chest tubes.     Review of Systems For ROS see HPI     Objective:   Physical Exam Vitals reviewed.  Constitutional:      General: She is not in acute distress.    Appearance: Normal appearance. She is well-developed. She is not ill-appearing.  HENT:     Head: Normocephalic and atraumatic.     Right Ear: Tympanic membrane normal.     Left Ear: Tympanic membrane normal.     Nose: Mucosal edema and congestion present. No rhinorrhea.     Right Sinus: Maxillary sinus tenderness and frontal sinus tenderness present.     Left Sinus: Maxillary sinus tenderness and frontal sinus tenderness present.     Mouth/Throat:     Pharynx: Uvula midline. Posterior oropharyngeal erythema present. No oropharyngeal exudate.  Eyes:     Conjunctiva/sclera: Conjunctivae normal.     Pupils: Pupils are equal, round, and reactive to light.  Cardiovascular:     Rate and Rhythm: Normal rate and regular rhythm.     Heart sounds: Normal heart sounds.  Pulmonary:     Effort: Pulmonary effort is normal. No respiratory distress.     Breath sounds: Rales (over LLL) present. No wheezing.  Musculoskeletal:     Cervical back: Normal range of motion and neck supple.  Lymphadenopathy:     Cervical: No cervical adenopathy.  Skin:    General: Skin is warm and dry.  Neurological:     General: No focal deficit present.     Mental Status: She is alert and oriented to person, place, and time.     Cranial Nerves: No cranial nerve deficit.     Motor: No weakness.     Coordination: Coordination normal.  Psychiatric:        Mood  and Affect: Mood normal.        Behavior: Behavior normal.        Thought Content: Thought content normal.           Assessment & Plan:  CAP- pt has known residual fluid in LLL and today has crackles w/ decreased BS and productive cough.  Since she recently had severe PNA w/ effusions that required chest tube drainage, will start Doxycycline BID x9 days.  Pt has this prescription at home as she only took 2 doses last time prior to being hospitalized.  Pt expressed understanding and is in agreement w/ plan.   Bacterial sinusitis- new.  Pt's TTP over frontal and maxillary sinuses is consistent w/ infection.  Since we are treating her CAP w/ Doxy, this will also cover sinuses.  Pt expressed understanding and is in agreement w/ plan.

## 2023-02-03 NOTE — Patient Instructions (Signed)
Follow up as needed or as scheduled START the Doxycycline twice daily- take w/ food CONTINUE your daily allergy medication Robitussin or Delsym as needed for cough Drink plenty of fluids REST!!! Happy Valentine's Day!!!

## 2023-02-10 ENCOUNTER — Other Ambulatory Visit: Payer: Self-pay | Admitting: Family Medicine

## 2023-02-17 ENCOUNTER — Other Ambulatory Visit: Payer: Self-pay | Admitting: Family Medicine

## 2023-03-23 ENCOUNTER — Ambulatory Visit (INDEPENDENT_AMBULATORY_CARE_PROVIDER_SITE_OTHER): Payer: Medicare HMO | Admitting: Family Medicine

## 2023-03-23 ENCOUNTER — Encounter: Payer: Self-pay | Admitting: Family Medicine

## 2023-03-23 VITALS — BP 124/70 | HR 66 | Temp 97.9°F | Resp 17 | Ht 63.0 in | Wt 133.5 lb

## 2023-03-23 DIAGNOSIS — E785 Hyperlipidemia, unspecified: Secondary | ICD-10-CM

## 2023-03-23 LAB — BASIC METABOLIC PANEL
BUN: 12 mg/dL (ref 6–23)
CO2: 30 mEq/L (ref 19–32)
Calcium: 9.4 mg/dL (ref 8.4–10.5)
Chloride: 101 mEq/L (ref 96–112)
Creatinine, Ser: 0.74 mg/dL (ref 0.40–1.20)
GFR: 82.11 mL/min (ref >60.00)
Glucose, Bld: 86 mg/dL (ref 70–99)
Potassium: 4 mEq/L (ref 3.5–5.1)
Sodium: 135 mEq/L (ref 135–145)

## 2023-03-23 LAB — LIPID PANEL
Cholesterol: 184 mg/dL (ref 0–200)
HDL: 85.6 mg/dL (ref >39.00)
LDL Cholesterol: 84 mg/dL (ref 0–99)
NonHDL: 98.12
Total CHOL/HDL Ratio: 2
Triglycerides: 73 mg/dL (ref 0.0–149.0)
VLDL: 14.6 mg/dL (ref 0.0–40.0)

## 2023-03-23 LAB — HEPATIC FUNCTION PANEL
ALT: 20 U/L (ref 0–35)
AST: 17 U/L (ref 0–37)
Albumin: 4.4 g/dL (ref 3.5–5.2)
Alkaline Phosphatase: 117 U/L (ref 39–117)
Bilirubin, Direct: 0.1 mg/dL (ref 0.0–0.3)
Total Bilirubin: 0.3 mg/dL (ref 0.2–1.2)
Total Protein: 7.2 g/dL (ref 6.0–8.3)

## 2023-03-23 LAB — TSH: TSH: 7.69 u[IU]/mL — ABNORMAL HIGH (ref 0.35–5.50)

## 2023-03-23 NOTE — Assessment & Plan Note (Signed)
Chronic problem.  On Lipitor 20mg daily w/o difficulty.  Check labs.  Adjust meds prn  ?

## 2023-03-23 NOTE — Progress Notes (Signed)
   Subjective:    Patient ID: Dana Bryan, female    DOB: Aug 23, 1951, 72 y.o.   MRN: [id number]  HPI Hyperlipidemia- chronic problem, on Lipitor 20mg  daily.  + fatigue- 'but I think it's my fault.  I do too much'.  Pt reports energy level and stamina continue to improve since recent PNA.  No CP, SOB, abd pain, N/V.   Review of Systems For ROS see HPI     Objective:   Physical Exam Vitals reviewed.  Constitutional:      General: She is not in acute distress.    Appearance: Normal appearance. She is well-developed. She is not ill-appearing.  HENT:     Head: Normocephalic and atraumatic.  Eyes:     Conjunctiva/sclera: Conjunctivae normal.     Pupils: Pupils are equal, round, and reactive to light.  Neck:     Thyroid: No thyromegaly.  Cardiovascular:     Rate and Rhythm: Normal rate and regular rhythm.     Pulses: Normal pulses.     Heart sounds: Murmur (II/VI SEM at RUSB) heard.  Pulmonary:     Effort: Pulmonary effort is normal. No respiratory distress.     Breath sounds: Normal breath sounds.  Abdominal:     General: There is no distension.     Palpations: Abdomen is soft.     Tenderness: There is no abdominal tenderness.  Musculoskeletal:     Cervical back: Normal range of motion and neck supple.     Right lower leg: No edema.     Left lower leg: No edema.  Lymphadenopathy:     Cervical: No cervical adenopathy.  Skin:    General: Skin is warm and dry.  Neurological:     Mental Status: She is alert and oriented to person, place, and time.  Psychiatric:        Behavior: Behavior normal.           Assessment & Plan:

## 2023-03-23 NOTE — Patient Instructions (Signed)
Schedule your complete physical in 6 months We'll notify you of your lab results and make any changes if needed Keep up the good work on healthy diet and regular exercise- you look great!!! Call with any questions or concerns Happy Spring!!! 

## 2023-03-24 ENCOUNTER — Telehealth: Payer: Self-pay

## 2023-03-24 ENCOUNTER — Other Ambulatory Visit: Payer: Self-pay

## 2023-03-24 DIAGNOSIS — R7989 Other specified abnormal findings of blood chemistry: Secondary | ICD-10-CM

## 2023-03-24 NOTE — Telephone Encounter (Signed)
-----   Message from Midge Minium, MD sent at 03/24/2023  7:29 AM EDT ----- Labs look great w/ exception of mildly elevated TSH- meaning thyroid could be under-functioning.  You did have a similar reading 3 yrs ago and things normalized without intervention.  Rather than starting medication right away, I would like to repeat your TSH level at a lab only visit in 1 month (dx elevated TSH) and see how things look.  If they remain abnormal at that time, we can start medication.

## 2023-03-24 NOTE — Telephone Encounter (Signed)
Pt aware of lab results and her lab only apt has been made . Repeat TSH order is in

## 2023-03-28 ENCOUNTER — Encounter (HOSPITAL_COMMUNITY): Payer: Self-pay

## 2023-03-28 ENCOUNTER — Ambulatory Visit (HOSPITAL_COMMUNITY)
Admission: RE | Admit: 2023-03-28 | Discharge: 2023-03-28 | Disposition: A | Discharge status: Home / Self Care | Payer: Medicare HMO | Source: Ambulatory Visit | Attending: Emergency Medicine | Admitting: Emergency Medicine

## 2023-03-28 VITALS — BP 147/64 | HR 72 | Temp 97.9°F | Resp 18 | Ht 63.0 in | Wt 132.0 lb

## 2023-03-28 DIAGNOSIS — N3001 Acute cystitis with hematuria: Secondary | ICD-10-CM | POA: Diagnosis not present

## 2023-03-28 LAB — POCT URINALYSIS DIPSTICK, ED / UC
Bilirubin Urine: NEGATIVE
Glucose, UA: NEGATIVE mg/dL
Ketones, ur: NEGATIVE mg/dL
Nitrite: NEGATIVE
Protein, ur: NEGATIVE mg/dL
Specific Gravity, Urine: 1.01 (ref 1.005–1.030)
Urobilinogen, UA: 0.2 mg/dL (ref 0.0–1.0)
pH: 7 (ref 5.0–8.0)

## 2023-03-28 MED ORDER — CEPHALEXIN 500 MG PO CAPS: 500.0000 mg | ORAL_CAPSULE | Freq: Two times a day (BID) | ORAL | 0 refills | Status: AC

## 2023-03-28 NOTE — Discharge Instructions (Signed)
We will call you if anything on urine culture requires a change in therapy (about 1-2 days)  In the meantime I am treating you for a urinary tract infection. Please take the antibiotic as prescribed, with food to avoid upset stomach. Drink lots of fluids!  Please watch for worsening symptoms including abdominal pain, upper back pain, fever, weakness, confusion.  If these occur please return immediately or go to the emergency department.

## 2023-03-28 NOTE — ED Provider Notes (Signed)
[facility]    CSN: [id number] Arrival date & time: 03/28/23  1113      History   Chief Complaint Chief Complaint  Patient presents with   Urinary Frequency    with pain and blood in urine. - Entered by patient    HPI Dana Bryan is a 72 y.o. female.  Here with 1 day history of dysuria, hematuria Has noticed blood with wiping in addition to urgency  Bladder discomfort but denies abdominal pain. No flank pain or fever Denies weakness, confusion, NVD  Past Medical History:  Diagnosis Date   Anxiety    on meds   Basal cell carcinoma    Cancer    Cataract    no sx    Generalized headaches    Heart murmur    per Dr.Tabori   Hyperlipidemia    on meds   Reactive depression (situational)    on meds   Squamous cell skin cancer, finger    Vasomotor rhinitis     Patient Active Problem List   Diagnosis Date Noted   Community acquired pneumonia with pleural effusion 11/10/2022   Electrolyte abnormality 11/10/2022   Aortic stenosis 07/23/2022   LBBB (left bundle branch block) 06/14/2022   Lichen sclerosus 01/07/2022   Osteopenia 09/09/2021   Heart murmur 01/23/2020   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   Routine health maintenance 08/06/2011   B12 deficiency 07/18/2008   Hyperlipidemia 02/15/2008   Anxiety and depression 02/15/2008    Past Surgical History:  Procedure Laterality Date   BUBBLE STUDY  09/04/2022   Procedure: BUBBLE STUDY;  Surgeon: Sande Rives'Neal, [place] Thomas, MD;  [facility];  Service: Cardiovascular;;   COLONOSCOPY  2008   normal    TEE WITHOUT CARDIOVERSION N/A 09/04/2022   Procedure: TRANSESOPHAGEAL ECHOCARDIOGRAM (TEE);  Surgeon: Sande Rives'Neal, [place] Thomas, MD;  [facility];  Service: Cardiovascular;  Laterality: N/A;   TONSILLECTOMY  1973    OB History   No obstetric history on file.      Home Medications    Prior to Admission medications   Medication Sig Start Date End Date Taking? Authorizing Provider   ALPRAZolam (XANAX) 0.25 MG tablet Take 1 tablet (0.25 mg total) by mouth daily as needed. Patient taking differently: Take 0.25 mg by mouth daily as needed for anxiety. 06/11/22  Yes Sheliah Hatchabori, Katherine E, MD  Ascorbic Acid (VITAMIN C PO) Take 500 mg by mouth daily.   Yes [provider]  atorvastatin (LIPITOR) 20 MG tablet TAKE 1 TABLET BY MOUTH EVERY DAY 02/17/23  Yes Sheliah Hatchabori, Katherine E, MD  buPROPion (WELLBUTRIN XL) 300 MG 24 hr tablet Take 1 tablet (300 mg total) by mouth daily. 12/17/22  Yes Sheliah Hatchabori, Katherine E, MD  busPIRone (BUSPAR) 7.5 MG tablet TAKE 1 TABLET BY MOUTH TWICE A DAY 02/10/23  Yes Sheliah Hatchabori, Katherine E, MD  cephALEXin (KEFLEX) 500 MG capsule Take 1 capsule (500 mg total) by mouth 2 (two) times daily for 7 days. 03/28/23 04/04/23 Yes Rayleen Wyrick, Lurena Joinerebecca, PA-C  Cholecalciferol (VITAMIN D3) 50 MCG (2000 UT) capsule Take 2,000 Units by mouth daily.   Yes [provider]  clobetasol cream (TEMOVATE) 0.05 % Apply 1 application  topically 2 (two) times daily as needed (irritation).   Yes [provider]  cyanocobalamin (VITAMIN B12) 1000 MCG tablet Take 1,000 mcg by mouth daily.   Yes [provider]  zinc gluconate 50 MG tablet Take 50 mg by mouth daily.   Yes [provider]  ibuprofen (ADVIL,MOTRIN)  200 MG tablet Take 400 mg by mouth daily as needed for headache.    [provider]    Family History Family History  Problem Relation Age of Onset   Heart attack Mother    Hypertension Mother    Hypertension Father    Alcohol abuse Father    Colon polyps Neg Hx    Colon cancer Neg Hx    Esophageal cancer Neg Hx    Stomach cancer Neg Hx    Rectal cancer Neg Hx     Social History Social History   Tobacco Use   Smoking status: Former    Years: 30    Types: Cigarettes    Quit date: 01/22/2004    Years since quitting: 19.1   Smokeless tobacco: Never  Vaping Use   Vaping Use: Never used  Substance Use Topics   Alcohol use: Yes     Alcohol/week: 15.0 standard drinks of alcohol    Types: 15 Standard drinks or equivalent per week    Comment: drinks 1-2 drinks daily   Drug use: Never     Allergies   Chlorhexidine gluconate   Review of Systems Review of Systems As per HPI  Physical Exam Triage Vital Signs ED Triage Vitals  Enc Vitals Group     BP 03/28/23 1150 (!) 152/61     Pulse Rate 03/28/23 1150 72     Resp 03/28/23 1150 18     Temp 03/28/23 1150 97.9 F (36.6 C)     Temp Source 03/28/23 1150 Oral     SpO2 03/28/23 1150 100 %     Weight 03/28/23 1146 132 lb (59.9 kg)     Height 03/28/23 1146 5\' 3"  (1.6 m)     Head Circumference --      Peak Flow --      Pain Score 03/28/23 1146 0     Pain Loc --      Pain Edu? --      Excl. in GC? --    No data found.  Updated Vital Signs BP (!) 147/64 (BP Location: Left Arm)   Pulse 72   Temp 97.9 F (36.6 C) (Oral)   Resp 18   Ht 5\' 3"  (1.6 m)   Wt 132 lb (59.9 kg)   SpO2 100%   BMI 23.38 kg/m   Visual Acuity Right Eye Distance:   Left Eye Distance:   Bilateral Distance:    Right Eye Near:   Left Eye Near:    Bilateral Near:     Physical Exam Vitals and nursing note reviewed.  Constitutional:      General: She is not in acute distress. HENT:     Mouth/Throat:     Mouth: Mucous membranes are moist.     Pharynx: Oropharynx is clear.  Eyes:     Conjunctiva/sclera: Conjunctivae normal.     Pupils: Pupils are equal, round, and reactive to light.  Cardiovascular:     Rate and Rhythm: Normal rate and regular rhythm.     Heart sounds: Normal heart sounds.  Pulmonary:     Effort: Pulmonary effort is normal.     Breath sounds: Normal breath sounds.  Abdominal:     General: Bowel sounds are normal.     Palpations: Abdomen is soft.     Tenderness: There is no abdominal tenderness. There is no right CVA tenderness or left CVA tenderness.  Neurological:     Mental Status: She is alert and oriented to person, place,  and time.      UC  Treatments / Results  Labs (all labs ordered are listed, but only abnormal results are displayed) Labs Reviewed  POCT URINALYSIS DIPSTICK, ED / UC - Abnormal; Notable for the following components:      Result Value   Hgb urine dipstick TRACE (*)    Leukocytes,Ua MODERATE (*)    All other components within normal limits  URINE CULTURE    EKG   Radiology No results found.  Procedures Procedures (including critical care time)  Medications Ordered in UC Medications - No data to display  Initial Impression / Assessment and Plan / UC Course  I have reviewed the triage vital signs and the nursing notes.  Pertinent labs & imaging results that were available during my care of the patient were reviewed by me and considered in my medical decision making (see chart for details).  Afebrile, well appearing. Unremarkable exam UA moderate leuks, trace hgb. Culture is pending. Treat for acute cystitis with keflex BID x 7 days Return precautions discussed. Patient agrees to plan  Final Clinical Impressions(s) / UC Diagnoses   Final diagnoses:  Acute cystitis with hematuria     Discharge Instructions      We will call you if anything on urine culture requires a change in therapy (about 1-2 days)  In the meantime I am treating you for a urinary tract infection. Please take the antibiotic as prescribed, with food to avoid upset stomach. Drink lots of fluids!  Please watch for worsening symptoms including abdominal pain, upper back pain, fever, weakness, confusion.  If these occur please return immediately or go to the emergency department.     ED Prescriptions     Medication Sig Dispense Auth. Provider   cephALEXin (KEFLEX) 500 MG capsule Take 1 capsule (500 mg total) by mouth 2 (two) times daily for 7 days. 14 capsule Jameah Rouser, Lurena Joiner, PA-C      PDMP not reviewed this encounter.   Courtnei Ruddell, Lurena Joiner, [place] 03/28/23 1235

## 2023-03-28 NOTE — ED Triage Notes (Signed)
Symptoms began yesterday. Still having pain with voiding and blood seen in urine. No fever.

## 2023-03-30 LAB — URINE CULTURE: Culture: 10000 — AB

## 2023-04-01 ENCOUNTER — Telehealth: Payer: Self-pay | Admitting: Family Medicine

## 2023-04-01 ENCOUNTER — Encounter: Payer: Self-pay | Admitting: Pulmonary Disease

## 2023-04-01 ENCOUNTER — Ambulatory Visit: Payer: Medicare HMO | Admitting: Pulmonary Disease

## 2023-04-01 VITALS — BP 138/76 | HR 63 | Temp 98.4°F | Ht 63.0 in | Wt 135.4 lb

## 2023-04-01 DIAGNOSIS — J189 Pneumonia, unspecified organism: Secondary | ICD-10-CM | POA: Diagnosis not present

## 2023-04-01 DIAGNOSIS — J90 Pleural effusion, not elsewhere classified: Secondary | ICD-10-CM

## 2023-04-01 NOTE — Telephone Encounter (Signed)
You cannot advise without an appointment correct?

## 2023-04-01 NOTE — Progress Notes (Signed)
Dana Bryan    [id number]    12-09-1951  Primary Care Physician:Tabori, Helane Rima, MD  Referring Physician: Sheliah Hatch, MD [address] [place],  [place] [address]  Chief complaint: Follow-up for pleural effusion  HPI: 72 y.o. who  has a past medical history of Anxiety, Basal cell carcinoma, Cancer, Cataract, Generalized headaches, Heart murmur, Hyperlipidemia, Reactive depression (situational), Squamous cell skin cancer, finger, and Vasomotor rhinitis.   She was hospitalized in October 2023 with community-acquired pneumonia, loculated left effusion. Pulmonology and CT surgery were consulted and recommended patient get CT-guided pigtail chest tube and TPN dornase once tube is placed to help clear septations. Chest tube was placed 11/22. IV azithromycin and rocephin course was completed along with 4 fibrinolysis treatments. Repeat CT chest was done and demonstrated a smaller pleural effusion still present and appropriate placement of chest tube. Her pain has been managed with scheduled oxycodone, toradol, and tylenol q6h PRN. Chest tube was removed 11/28.  She was discharged on 14 days of p.o. Augmentin  Follow-up appointment on 12/12 and 01/03/23 with chest x-ray showing small reactive complement of effusion   Interim history: She is feeling a whole lot better with no complaints  Outpatient Encounter Medications as of 04/01/2023  Medication Sig   ALPRAZolam (XANAX) 0.25 MG tablet Take 1 tablet (0.25 mg total) by mouth daily as needed. (Patient taking differently: Take 0.25 mg by mouth daily as needed for anxiety.)   Ascorbic Acid (VITAMIN C PO) Take 500 mg by mouth daily.   atorvastatin (LIPITOR) 20 MG tablet TAKE 1 TABLET BY MOUTH EVERY DAY   buPROPion (WELLBUTRIN XL) 300 MG 24 hr tablet Take 1 tablet (300 mg total) by mouth daily.   busPIRone (BUSPAR) 7.5 MG tablet TAKE 1 TABLET BY MOUTH TWICE A DAY   cephALEXin (KEFLEX) 500 MG capsule Take 1 capsule  (500 mg total) by mouth 2 (two) times daily for 7 days.   Cholecalciferol (VITAMIN D3) 50 MCG (2000 UT) capsule Take 2,000 Units by mouth daily.   clobetasol cream (TEMOVATE) 0.05 % Apply 1 application  topically 2 (two) times daily as needed (irritation).   cyanocobalamin (VITAMIN B12) 1000 MCG tablet Take 1,000 mcg by mouth daily.   ibuprofen (ADVIL,MOTRIN) 200 MG tablet Take 400 mg by mouth daily as needed for headache.   zinc gluconate 50 MG tablet Take 50 mg by mouth daily.   No facility-administered encounter medications on file as of 04/01/2023.   Physical Exam: Blood pressure 138/76, pulse 63, temperature 98.4 F (36.9 C), temperature source Oral, height 5\' 3"  (1.6 m), weight 135 lb 6.4 oz (61.4 kg), SpO2 99 %. Gen:      No acute distress HEENT:  EOMI, sclera anicteric Neck:     No masses; no thyromegaly Lungs:    Clear to auscultation bilaterally; normal respiratory effort CV:         Regular rate and rhythm; no murmurs Abd:      + bowel sounds; soft, non-tender; no palpable masses, no distension Ext:    No edema; adequate peripheral perfusion Skin:      Warm and dry; no rash Neuro: alert and oriented x 3 Psych: normal mood and affect   Data Reviewed: Imaging: CT chest 11/10/2022-small loculated effusion with left lower lobe consolidation CT chest 01/15/2022-decrease in size of loculated effusion, minimal airspace disease in the right upper lobe Chest x-ray 12/01/2022-small loculated left pleural collection Chest x-ray 01/03/2023-scarring  volume loss left base, small left pleural effusion I had reviewed the images personally  Labs: Pleural fluid studies 11/11/2022 Cytology-no malignancy, active inflammation Cultures-negative  Assessment:  Community acquired pneumonia with parapneumonic loculated effusion Had chest tube placed with intrapleural lytics while in the hospital Appears to making a good recovery She finished a prolonged course of antibiotics  Will get a  follow-up CT chest.  If it looks okay then she does not need to follow-up with [place]  Plan/Recommendations: CT chest  Chilton Greathouse MD [facility] 04/01/2023, 2:08 PM  CC: Sheliah Hatch, MD

## 2023-04-01 NOTE — Addendum Note (Signed)
Addended by: Dorisann Frames R on: 04/01/2023 02:20 PM   Modules accepted: Orders

## 2023-04-01 NOTE — Telephone Encounter (Signed)
Pt informed

## 2023-04-01 NOTE — Patient Instructions (Signed)
Will get a CT chest with contrast for reevaluation of loculated pleural effusion If it is normal you can return to clinic as needed

## 2023-04-01 NOTE — Telephone Encounter (Signed)
She is being treated appropriately

## 2023-04-01 NOTE — Telephone Encounter (Signed)
Caller name: Jamilyn Watchorn  On DPR?: Yes  Call back number: [phone] (mobile)  Provider they see: Sheliah Hatch, MD  Reason for call: Pt went to urgent care for UTI and would Dr Beverely Low to confirm that she needs to be on the ABX they prescribed- advise

## 2023-04-09 ENCOUNTER — Telehealth: Payer: Self-pay | Admitting: Family Medicine

## 2023-04-09 NOTE — Telephone Encounter (Signed)
Contacted [name] to schedule their annual wellness visit. Appointment made for 04/21/2023.  Thank you,  [facility] Support [facility] Direct dial  [phone]

## 2023-04-21 ENCOUNTER — Other Ambulatory Visit (INDEPENDENT_AMBULATORY_CARE_PROVIDER_SITE_OTHER): Payer: Medicare HMO

## 2023-04-21 ENCOUNTER — Ambulatory Visit (INDEPENDENT_AMBULATORY_CARE_PROVIDER_SITE_OTHER): Payer: Medicare HMO | Admitting: *Deleted

## 2023-04-21 DIAGNOSIS — R7989 Other specified abnormal findings of blood chemistry: Secondary | ICD-10-CM

## 2023-04-21 DIAGNOSIS — Z1211 Encounter for screening for malignant neoplasm of colon: Secondary | ICD-10-CM | POA: Diagnosis not present

## 2023-04-21 DIAGNOSIS — Z0000 Encounter for general adult medical examination without abnormal findings: Secondary | ICD-10-CM

## 2023-04-21 LAB — TSH: TSH: 5.81 u[IU]/mL — ABNORMAL HIGH (ref 0.35–5.50)

## 2023-04-21 NOTE — Patient Instructions (Signed)
Dana Bryan , Thank you for taking time to come for your Medicare Wellness Visit. I appreciate your ongoing commitment to your health goals. Please review the following plan we discussed and let me know if I can assist you in the future.   Screening recommendations/referrals: Colonoscopy: up to date Mammogram: up to date Bone Density: up tod ate Recommended yearly ophthalmology/optometry visit for glaucoma screening and checkup Recommended yearly dental visit for hygiene and checkup  Vaccinations: Influenza vaccine: up to date Pneumococcal vaccine: up to date Tdap vaccine: up to date Shingles vaccine: up to date     Advanced directives: yes  not on file     Preventive Care 65 Years and Older, Female Preventive care refers to lifestyle choices and visits with your health care provider that can promote health and wellness. What does preventive care include? A yearly physical exam. This is also called an annual well check. Dental exams once or twice a year. Routine eye exams. Ask your health care provider how often you should have your eyes checked. Personal lifestyle choices, including: Daily care of your teeth and gums. Regular physical activity. Eating a healthy diet. Avoiding tobacco and drug use. Limiting alcohol use. Practicing safe sex. Taking low-dose aspirin every day. Taking vitamin and mineral supplements as recommended by your health care provider. What happens during an annual well check? The services and screenings done by your health care provider during your annual well check will depend on your age, overall health, lifestyle risk factors, and family history of disease. Counseling  Your health care provider may ask you questions about your: Alcohol use. Tobacco use. Drug use. Emotional well-being. Home and relationship well-being. Sexual activity. Eating habits. History of falls. Memory and ability to understand (cognition). Work and work  Astronomer. Reproductive health. Screening  You may have the following tests or measurements: Height, weight, and BMI. Blood pressure. Lipid and cholesterol levels. These may be checked every 5 years, or more frequently if you are over 70 years old. Skin check. Lung cancer screening. You may have this screening every year starting at age 70 if you have a 30-pack-year history of smoking and currently smoke or have quit within the past 15 years. Fecal occult blood test (FOBT) of the stool. You may have this test every year starting at age 70. Flexible sigmoidoscopy or colonoscopy. You may have a sigmoidoscopy every 5 years or a colonoscopy every 10 years starting at age 70. Hepatitis C blood test. Hepatitis B blood test. Sexually transmitted disease (STD) testing. Diabetes screening. This is done by checking your blood sugar (glucose) after you have not eaten for a while (fasting). You may have this done every 1-3 years. Bone density scan. This is done to screen for osteoporosis. You may have this done starting at age 70. Mammogram. This may be done every 1-2 years. Talk to your health care provider about how often you should have regular mammograms. Talk with your health care provider about your test results, treatment options, and if necessary, the need for more tests. Vaccines  Your health care provider may recommend certain vaccines, such as: Influenza vaccine. This is recommended every year. Tetanus, diphtheria, and acellular pertussis (Tdap, Td) vaccine. You may need a Td booster every 10 years. Zoster vaccine. You may need this after age 11. Pneumococcal 13-valent conjugate (PCV13) vaccine. One dose is recommended after age 70. Pneumococcal polysaccharide (PPSV23) vaccine. One dose is recommended after age 18. Talk to your health care provider about which screenings and  vaccines you need and how often you need them. This information is not intended to replace advice given to you by  your health care provider. Make sure you discuss any questions you have with your health care provider. Document Released: 01/03/2016 Document Revised: 08/26/2016 Document Reviewed: 10/08/2015 Elsevier Interactive Patient Education  2017 [employer] Prevention in the Home Falls can cause injuries. They can happen to people of all ages. There are many things you can do to make your home safe and to help prevent falls. What can I do on the outside of my home? Regularly fix the edges of walkways and driveways and fix any cracks. Remove anything that might make you trip as you walk through a door, such as a raised step or threshold. Trim any bushes or trees on the path to your home. Use bright outdoor lighting. Clear any walking paths of anything that might make someone trip, such as rocks or tools. Regularly check to see if handrails are loose or broken. Make sure that both sides of any steps have handrails. Any raised decks and porches should have guardrails on the edges. Have any leaves, snow, or ice cleared regularly. Use sand or salt on walking paths during winter. Clean up any spills in your garage right away. This includes oil or grease spills. What can I do in the bathroom? Use night lights. Install grab bars by the toilet and in the tub and shower. Do not use towel bars as grab bars. Use non-skid mats or decals in the tub or shower. If you need to sit down in the shower, use a plastic, non-slip stool. Keep the floor dry. Clean up any water that spills on the floor as soon as it happens. Remove soap buildup in the tub or shower regularly. Attach bath mats securely with double-sided non-slip rug tape. Do not have throw rugs and other things on the floor that can make you trip. What can I do in the bedroom? Use night lights. Make sure that you have a light by your bed that is easy to reach. Do not use any sheets or blankets that are too big for your bed. They should not hang  down onto the floor. Have a firm chair that has side arms. You can use this for support while you get dressed. Do not have throw rugs and other things on the floor that can make you trip. What can I do in the kitchen? Clean up any spills right away. Avoid walking on wet floors. Keep items that you use a lot in easy-to-reach places. If you need to reach something above you, use a strong step stool that has a grab bar. Keep electrical cords out of the way. Do not use floor polish or wax that makes floors slippery. If you must use wax, use non-skid floor wax. Do not have throw rugs and other things on the floor that can make you trip. What can I do with my stairs? Do not leave any items on the stairs. Make sure that there are handrails on both sides of the stairs and use them. Fix handrails that are broken or loose. Make sure that handrails are as long as the stairways. Check any carpeting to make sure that it is firmly attached to the stairs. Fix any carpet that is loose or worn. Avoid having throw rugs at the top or bottom of the stairs. If you do have throw rugs, attach them to the floor with carpet tape. Make  sure that you have a light switch at the top of the stairs and the bottom of the stairs. If you do not have them, ask someone to add them for you. What else can I do to help prevent falls? Wear shoes that: Do not have high heels. Have rubber bottoms. Are comfortable and fit you well. Are closed at the toe. Do not wear sandals. If you use a stepladder: Make sure that it is fully opened. Do not climb a closed stepladder. Make sure that both sides of the stepladder are locked into place. Ask someone to hold it for you, if possible. Clearly mark and make sure that you can see: Any grab bars or handrails. First and last steps. Where the edge of each step is. Use tools that help you move around (mobility aids) if they are needed. These  include: Canes. Walkers. Scooters. Crutches. Turn on the lights when you go into a dark area. Replace any light bulbs as soon as they burn out. Set up your furniture so you have a clear path. Avoid moving your furniture around. If any of your floors are uneven, fix them. If there are any pets around you, be aware of where they are. Review your medicines with your doctor. Some medicines can make you feel dizzy. This can increase your chance of falling. Ask your doctor what other things that you can do to help prevent falls. This information is not intended to replace advice given to you by your health care provider. Make sure you discuss any questions you have with your health care provider. Document Released: 10/03/2009 Document Revised: 05/14/2016 Document Reviewed: 01/11/2015 Elsevier Interactive Patient Education  2017 [employer].

## 2023-04-21 NOTE — Progress Notes (Signed)
Subjective:   Dana Bryan is a 72 y.o. female who presents for Medicare Annual (Subsequent) preventive examination.  I connected with  [name] on 04/21/23 by a telephone enabled telemedicine application and verified that I am speaking with the correct person using two identifiers.   I discussed the limitations of evaluation and management by telemedicine. The patient expressed understanding and agreed to proceed.  Patient location: home  Provider location: telephone/home    Review of Systems       Cardiac Risk Factors include: advanced age (>81men, >60 women)     Objective:    Today's Vitals   There is no height or weight on file to calculate BMI.     04/21/2023    9:37 AM 03/28/2023   11:49 AM 11/11/2022    6:52 PM 11/09/2022   11:05 PM 09/04/2022    6:45 AM 07/23/2022   11:43 AM 01/27/2021    1:38 PM  Advanced Directives  Does Patient Have a Medical Advance Directive? Yes Yes  No Yes Yes Yes  Type of Advertising copywriter   Living will;Healthcare Power of [employer] Power of [place];Living will Healthcare Power of [place];Living will  Does patient want to make changes to medical advance directive?  Yes (ED - Information included in AVS)       Copy of Healthcare Power of Attorney in Chart? No - copy requested    No - copy requested No - copy requested Yes - validated most recent copy scanned in chart (See row information)  Would patient like information on creating a medical advance directive?  No - Patient declined No - Patient declined        Current Medications (verified) Outpatient Encounter Medications as of 04/21/2023  Medication Sig   ALPRAZolam (XANAX) 0.25 MG tablet Take 1 tablet (0.25 mg total) by mouth daily as needed. (Patient taking differently: Take 0.25 mg by mouth daily as needed for anxiety.)   Ascorbic Acid (VITAMIN C PO) Take 500 mg by mouth daily.   atorvastatin  (LIPITOR) 20 MG tablet TAKE 1 TABLET BY MOUTH EVERY DAY   buPROPion (WELLBUTRIN XL) 300 MG 24 hr tablet Take 1 tablet (300 mg total) by mouth daily.   busPIRone (BUSPAR) 7.5 MG tablet TAKE 1 TABLET BY MOUTH TWICE A DAY   Cholecalciferol (VITAMIN D3) 50 MCG (2000 UT) capsule Take 2,000 Units by mouth daily.   clobetasol cream (TEMOVATE) 0.05 % Apply 1 application  topically 2 (two) times daily as needed (irritation).   cyanocobalamin (VITAMIN B12) 1000 MCG tablet Take 1,000 mcg by mouth daily.   ibuprofen (ADVIL,MOTRIN) 200 MG tablet Take 400 mg by mouth daily as needed for headache.   zinc gluconate 50 MG tablet Take 50 mg by mouth daily.   No facility-administered encounter medications on file as of 04/21/2023.    Allergies (verified) Chlorhexidine gluconate   History: Past Medical History:  Diagnosis Date   Anxiety    on meds   Basal cell carcinoma    Cancer (HCC)    Cataract    no sx    Generalized headaches    Heart murmur    per Dr.Tabori   Hyperlipidemia    on meds   Reactive depression (situational)    on meds   Squamous cell skin cancer, finger    Vasomotor rhinitis    Past Surgical History:  Procedure Laterality Date   BUBBLE STUDY  09/04/2022  Procedure: BUBBLE STUDY;  Surgeon: Sande Rives, MD;  Location: [facility] ENDOSCOPY;  Service: Cardiovascular;;   COLONOSCOPY  2008   normal    TEE WITHOUT CARDIOVERSION N/A 09/04/2022   Procedure: TRANSESOPHAGEAL ECHOCARDIOGRAM (TEE);  Surgeon: Sande Rives, MD;  Location: [facility] ENDOSCOPY;  Service: Cardiovascular;  Laterality: N/A;   TONSILLECTOMY  1973   Family History  Problem Relation Age of Onset   Heart attack Mother    Hypertension Mother    Hypertension Father    Alcohol abuse Father    Colon polyps Neg Hx    Colon cancer Neg Hx    Esophageal cancer Neg Hx    Stomach cancer Neg Hx    Rectal cancer Neg Hx    Social History   Socioeconomic History   Marital status: Married    Spouse name: Not on  file   Number of children: 2   Years of education: Not on file   Highest education level: Some college, no degree  Occupational History   Occupation: Retired Photographer  Tobacco Use   Smoking status: Former    Years: 30    Types: Cigarettes    Quit date: 01/22/2004    Years since quitting: 19.2   Smokeless tobacco: Never  Vaping Use   Vaping Use: Never used  Substance and Sexual Activity   Alcohol use: Yes    Alcohol/week: 15.0 standard drinks of alcohol    Types: 15 Standard drinks or equivalent per week    Comment: drinks 1-2 drinks daily   Drug use: Never   Sexual activity: Not Currently  Other Topics Concern   Not on file  Social History Narrative   HSG, college Grad. Married '71. 2 Daughters- '77, '81. 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))   Social Determinants of Health   Financial Resource Strain: Low Risk  (04/21/2023)   Overall Financial Resource Strain (CARDIA)    Difficulty of Paying Living Expenses: Not hard at all  Food Insecurity: No Food Insecurity (04/21/2023)   Hunger Vital Sign    Worried About Running Out of Food in the Last Year: Never true    Ran Out of Food in the Last Year: Never true  Transportation Needs: No Transportation Needs (04/21/2023)   PRAPARE - Administrator, Civil Service (Medical): No    Lack of Transportation (Non-Medical): No  Physical Activity: Sufficiently Active (04/21/2023)   Exercise Vital Sign    Days of Exercise per Week: 4 days    Minutes of Exercise per Session: 60 min  Stress: No Stress Concern Present (04/21/2023)   [employer] of Occupational Health - Occupational Stress Questionnaire    Feeling of Stress : Not at all  Social Connections: Moderately Isolated (04/21/2023)   Social Connection and Isolation Panel [NHANES]    Frequency of Communication with Friends and Family: More than three times a week    Frequency of Social Gatherings with  Friends and Family: Once a week    Attends Religious Services: Never    Database administrator or Organizations: No    Attends Engineer, structural: Never    Marital Status: Married    Tobacco Counseling Counseling given: Not Answered   Clinical Intake:  Pre-visit preparation completed: Yes  Pain : No/denies pain     Diabetes: No  How often do you need to have someone help you when you read  instructions, pamphlets, or other written materials from your doctor or pharmacy?: 1 - Never  Diabetic?  no  Interpreter Needed?: No  Information entered by :: Remi Haggard LPN   Activities of Daily Living    04/21/2023    9:40 AM 04/20/2023    9:25 PM  In your present state of health, do you have any difficulty performing the following activities:  Hearing? 0 0  Vision? 0 0  Difficulty concentrating or making decisions? 1 1  Walking or climbing stairs? 0 0  Dressing or bathing? 0 0  Doing errands, shopping? 0 0  Preparing Food and eating ? N N  Using the Toilet? N N  In the past six months, have you accidently leaked urine? N Y  Do you have problems with loss of bowel control? Y N  Managing your Medications? N N  Managing your Finances? N N  Housekeeping or managing your Housekeeping? N N    Patient Care Team: Sheliah Hatch, MD as PCP - General (Family Medicine) O'Neal, Ronnald Ramp, MD as PCP - Cardiology (Cardiology) Huel Cote, MD as Consulting Physician (Obstetrics and Gynecology) Adela Lank, Willaim Rayas, MD as Consulting Physician (Gastroenterology) Dahlia Byes, [facility] (Inactive) as Pharmacist (Pharmacist)  Indicate any recent Medical Services you may have received from other than [facility] providers in the past year (date may be approximate).     Assessment:   This is a routine wellness examination for Baylynn.  Hearing/Vision screen Hearing Screening - Comments:: No trouble hearing Vision Screening - Comments:: Up to date [facility]  Dietary issues  and exercise activities discussed: Current Exercise Habits: Home exercise routine;Structured exercise class, Type of exercise: strength training/weights;walking, Time (Minutes): 40, Frequency (Times/Week): 4, Weekly Exercise (Minutes/Week): 160, Intensity: Mild   Goals Addressed             This Visit's Progress    Patient Stated   On track    Increase activity     Patient Stated       Continue to reduce alcohol       Depression Screen    04/21/2023    9:45 AM 03/23/2023    8:41 AM 02/03/2023    1:56 PM 11/30/2022   10:40 AM 11/06/2022   10:19 AM 09/21/2022   10:20 AM 07/23/2022   11:43 AM  PHQ 2/9 Scores  PHQ - 2 Score 0 0 0 0 1 1 0  PHQ- 9 Score 0 6 1 2 5 7      Fall Risk    04/21/2023    9:41 AM 04/20/2023    9:25 PM 03/23/2023    8:41 AM 02/03/2023    1:56 PM 11/30/2022   10:40 AM  Fall Risk   Falls in the past year? 0 0 0 0 0  Number falls in past yr: 0  0 0   Injury with Fall? 0  0 0   Risk for fall due to :   No Fall Risks No Fall Risks No Fall Risks  Follow up Education provided;Falls evaluation completed;Falls prevention discussed  Falls evaluation completed Falls evaluation completed     FALL RISK PREVENTION PERTAINING TO THE HOME:  Any stairs in or around the home? No  If so, are there any without handrails? No  Home free of loose throw rugs in walkways, pet beds, electrical cords, etc? Yes  Adequate lighting in your home to reduce risk of falls? Yes   ASSISTIVE DEVICES UTILIZED TO PREVENT FALLS:  Life alert? No  Use  of a cane, walker or w/c? No  Grab bars in the bathroom? Yes  Shower chair or bench in shower? No  Elevated toilet seat or a handicapped toilet? Yes   TIMED UP AND GO:  Was the test performed? No .    Cognitive Function:        04/21/2023    9:42 AM 07/23/2022   11:47 AM  6CIT Screen  What Year? 0 points 0 points  What month? 0 points 0 points  What time? 0 points 0 points  Count back from 20 0 points 0 points  Months in reverse 0  points 0 points  Repeat phrase 0 points 0 points  Total Score 0 points 0 points    Immunizations Immunization History  Administered Date(s) Administered   COVID-19, mRNA, vaccine(Comirnaty)12 years and older 03/25/2023   Fluad Quad(high Dose 65+) 09/10/2020   Influenza Whole 10/12/2008, 09/16/2009, 07/28/2012   Influenza, High Dose Seasonal PF 09/18/2019, 10/02/2021, 09/21/2022   Influenza,inj,Quad PF,6+ Mos 08/31/2014, 10/26/2017   Influenza-Unspecified 09/28/2013, 10/05/2015, 10/31/2018   PFIZER Comirnaty(Gray Top)Covid-19 Tri-Sucrose Vaccine 04/01/2021   PFIZER(Purple Top)SARS-COV-2 Vaccination 02/11/2020, 03/06/2020, 10/19/2020, 09/26/2022   Pfizer Covid-19 Vaccine Bivalent Booster 4yrs & up 10/02/2021, 05/01/2022   Pfizer Covid-19 Vaccine Bivalent Booster 5y-11y 04/30/2022   Pneumococcal Conjugate-13 01/06/2019   Pneumococcal Polysaccharide-23 02/07/2021   Respiratory Syncytial Virus Vaccine,Recomb Aduvanted(Arexvy) 09/26/2022, 09/29/2022   Tdap 01/07/2016   Zoster Recombinat (Shingrix) 07/21/2019, 10/21/2019   Zoster, Live 11/08/2014    TDAP status: Up to date  Flu Vaccine status: Up to date  Pneumococcal vaccine status: Up to date  Covid-19 vaccine status: Information provided on how to obtain vaccines.   Qualifies for Shingles Vaccine? No   Zostavax completed Yes   Shingrix Completed?: Yes  Screening Tests Health Maintenance  Topic Date Due   INFLUENZA VACCINE  07/22/2023   MAMMOGRAM  10/01/2023   COLONOSCOPY (Pts 45-11yrs Insurance coverage will need to be confirmed)  11/22/2023   Medicare Annual Wellness (AWV)  04/20/2024   DTaP/Tdap/Td (2 - Td or Tdap) 01/06/2026   Pneumonia Vaccine 32+ Years old  Completed   DEXA SCAN  Completed   Hepatitis C Screening  Completed   Zoster Vaccines- Shingrix  Completed   HPV VACCINES  Aged Out   COVID-19 Vaccine  Discontinued    Health Maintenance  There are no preventive care reminders to display for this  patient.  Colorectal cancer screening: Type of screening: Colonoscopy. Completed 2023. Repeat every 3 years  Mammogram status: Completed  . Repeat every year  Bone Density status: Completed 20223. Results reflect: Bone density results: OSTEOPENIA. Repeat every 2 years.  Lung Cancer Screening: (Low Dose CT Chest recommended if Age 51-80 years, 30 pack-year currently smoking OR have quit w/in 15years.) does not qualify.   Lung Cancer Screening Referral:   Additional Screening:  Hepatitis C Screening: does not qualify; Completed 2018  Vision Screening: Recommended annual ophthalmology exams for early detection of glaucoma and other disorders of the eye. Is the patient up to date with their annual eye exam?  Yes  Who is the provider or what is the name of the office in which the patient attends annual eye exams? [facility] If pt is not established with a provider, would they like to be referred to a provider to establish care? No .   Dental Screening: Recommended annual dental exams for proper oral hygiene  Community Resource Referral / Chronic Care Management: CRR required this visit?  No   CCM  required this visit?  No      Plan:     I have personally reviewed and noted the following in the patient's chart:   Medical and social history Use of alcohol, tobacco or illicit drugs  Current medications and supplements including opioid prescriptions. Patient is not currently taking opioid prescriptions. Functional ability and status Nutritional status Physical activity Advanced directives List of other physicians Hospitalizations, surgeries, and ER visits in previous 12 months Vitals Screenings to include cognitive, depression, and falls Referrals and appointments  In addition, I have reviewed and discussed with patient certain preventive protocols, quality metrics, and best practice recommendations. A written personalized care plan for preventive services as well as general  preventive health recommendations were provided to patient.     Remi Haggard, LPN   12/26/1094   Nurse Notes:

## 2023-05-04 ENCOUNTER — Ambulatory Visit
Admission: RE | Admit: 2023-05-04 | Discharge: 2023-05-04 | Disposition: A | Discharge status: Home / Self Care | Payer: Medicare HMO | Source: Ambulatory Visit | Attending: Pulmonary Disease | Admitting: Pulmonary Disease

## 2023-05-04 DIAGNOSIS — Z8701 Personal history of pneumonia (recurrent): Secondary | ICD-10-CM | POA: Diagnosis not present

## 2023-05-04 DIAGNOSIS — J90 Pleural effusion, not elsewhere classified: Secondary | ICD-10-CM

## 2023-05-04 DIAGNOSIS — I700 Atherosclerosis of aorta: Secondary | ICD-10-CM | POA: Diagnosis not present

## 2023-05-04 MED ORDER — IOPAMIDOL (ISOVUE-300) INJECTION 61%
75.0000 mL | Freq: Once | INTRAVENOUS | Status: AC | PRN
  Administered 2023-05-04: 75 mL via INTRAVENOUS

## 2023-05-06 DIAGNOSIS — L738 Other specified follicular disorders: Secondary | ICD-10-CM | POA: Diagnosis not present

## 2023-05-06 DIAGNOSIS — L814 Other melanin hyperpigmentation: Secondary | ICD-10-CM | POA: Diagnosis not present

## 2023-05-06 DIAGNOSIS — L578 Other skin changes due to chronic exposure to nonionizing radiation: Secondary | ICD-10-CM | POA: Diagnosis not present

## 2023-05-06 DIAGNOSIS — D1801 Hemangioma of skin and subcutaneous tissue: Secondary | ICD-10-CM | POA: Diagnosis not present

## 2023-05-06 DIAGNOSIS — L565 Disseminated superficial actinic porokeratosis (DSAP): Secondary | ICD-10-CM | POA: Diagnosis not present

## 2023-05-06 DIAGNOSIS — I788 Other diseases of capillaries: Secondary | ICD-10-CM | POA: Diagnosis not present

## 2023-05-06 DIAGNOSIS — L821 Other seborrheic keratosis: Secondary | ICD-10-CM | POA: Diagnosis not present

## 2023-05-10 ENCOUNTER — Other Ambulatory Visit: Payer: Self-pay | Admitting: Family Medicine

## 2023-05-14 ENCOUNTER — Other Ambulatory Visit: Payer: Self-pay | Admitting: Family Medicine

## 2023-07-06 DIAGNOSIS — H2513 Age-related nuclear cataract, bilateral: Secondary | ICD-10-CM | POA: Diagnosis not present

## 2023-07-14 ENCOUNTER — Ambulatory Visit (INDEPENDENT_AMBULATORY_CARE_PROVIDER_SITE_OTHER): Payer: Medicare HMO | Admitting: Family Medicine

## 2023-07-14 ENCOUNTER — Encounter: Payer: Self-pay | Admitting: Family Medicine

## 2023-07-14 VITALS — BP 124/80 | HR 63 | Temp 98.4°F | Wt 130.4 lb

## 2023-07-14 DIAGNOSIS — F419 Anxiety disorder, unspecified: Secondary | ICD-10-CM | POA: Diagnosis not present

## 2023-07-14 DIAGNOSIS — F32A Depression, unspecified: Secondary | ICD-10-CM

## 2023-07-14 NOTE — Progress Notes (Signed)
   Subjective:    Patient ID: Dana Bryan, female    DOB: [birth year], 72 y.o.   MRN: [id number]  HPI Anxiety/Depression- has called [facility] to schedule an appt.  Daughter is a Chartered loss adjuster, compulsive shopper, and just had her house foreclosed on.  Pt has been over at the house daily this month trying to clean out the house.  Daughter lost her job, pt is paying all of her expenses.  Currently on Wellbutrin XL 300mg  daily, Buspar 7.5mg  BID.  Doesn't know what to do or how to proceed b/c she feels like she is worrying 'all the time'.   Review of Systems For ROS see HPI     Objective:   Physical Exam Vitals reviewed.  Constitutional:      General: She is not in acute distress.    Appearance: Normal appearance. She is not ill-appearing.  HENT:     Head: Normocephalic and atraumatic.  Cardiovascular:     Rate and Rhythm: Normal rate.  Pulmonary:     Effort: Pulmonary effort is normal. No respiratory distress.  Skin:    General: Skin is warm and dry.  Neurological:     General: No focal deficit present.     Mental Status: She is alert and oriented to person, place, and time.  Psychiatric:        Mood and Affect: Mood normal.        Behavior: Behavior normal.        Thought Content: Thought content normal.           Assessment & Plan:

## 2023-07-14 NOTE — Assessment & Plan Note (Signed)
Deteriorated.  Pt is having a difficult situation w/ her daughter who is a Chartered loss adjuster, has lost her house, and her job.  Pt is now paying all her bills and trying to clean out the house and relocate her to a new apartment.  Pt has already reached out to [employer].  Encouraged her to set limits and boundaries with her daughter- emotionally and financially.  We searched for hoarding intervention locally and she has some resources to call and work with.  Pt felt better at end of visit.  Total time spent w/ pt- 32 minutes, >50% spent counseling

## 2023-07-14 NOTE — Patient Instructions (Addendum)
Follow up as needed or as scheduled Google 'Hoarding intervention Ginette Otto' or go to [employer] to look for someone that would be a good fit It's ok to set boundaries to protect yourself and your sanity Call with any questions or concerns Hang in there!!

## 2023-07-20 NOTE — Progress Notes (Unsigned)
Cardiology Office Note:   Date:  07/21/2023  NAME:  Dana Bryan    MRN: [id number] DOB:  10/09/1951   PCP:  Sheliah Hatch, MD  Cardiologist:  Reatha Harps, MD  Electrophysiologist:  None   Referring MD: Sheliah Hatch, MD   Chief Complaint  Patient presents with   Follow-up         History of Present Illness:   Dana Bryan is a 72 y.o. female with a hx of bicuspid aortic valve, moderate aortic stenosis, non-obstructive CAD who presents for follow-up.  She presents for follow-up.  Murmur still consistent with moderate aortic stenosis.  Denies any chest pains or trouble breathing.  Lipids are close enough to goal.  Denies any symptoms of angina.  Overall doing well.  Still traveling to [place] to help with grandchildren periodically.  Overall seems to be doing quite well without any complaints.  We have continued with surveillance of her aortic valve.  Problem List LBBB -rate related  T chol 184, HDL 85, LDL 84, TG 73 Aortic stenosis/Bicuspid aortic valve  -moderate AS 08/2022 -Vmax 2.8 m/s, MG 21 mmHG, AVA 1.15 cm2 Non-obstructive CAD -minimal CAD CCTA 07/13/2022 -coronary calcium 27 (58th percentile)   Past Medical History: Past Medical History:  Diagnosis Date   Anxiety    on meds   Basal cell carcinoma    Cancer (HCC)    Cataract    no sx    Generalized headaches    Heart murmur    per Dr.Tabori   Hyperlipidemia    on meds   Reactive depression (situational)    on meds   Squamous cell skin cancer, finger    Vasomotor rhinitis     Past Surgical History: Past Surgical History:  Procedure Laterality Date   BUBBLE STUDY  09/04/2022   Procedure: BUBBLE STUDY;  Surgeon: Sande Rives, MD;  Location: [facility] ENDOSCOPY;  Service: Cardiovascular;;   COLONOSCOPY  2008   normal    TEE WITHOUT CARDIOVERSION N/A 09/04/2022   Procedure: TRANSESOPHAGEAL ECHOCARDIOGRAM (TEE);  Surgeon: Sande Rives, MD;  Location: [facility] ENDOSCOPY;   Service: Cardiovascular;  Laterality: N/A;   TONSILLECTOMY  1973    Current Medications: Current Meds  Medication Sig   ALPRAZolam (XANAX) 0.25 MG tablet Take 1 tablet (0.25 mg total) by mouth daily as needed. (Patient taking differently: Take 0.25 mg by mouth daily as needed for anxiety.)   Ascorbic Acid (VITAMIN C PO) Take 500 mg by mouth daily.   atorvastatin (LIPITOR) 20 MG tablet TAKE 1 TABLET BY MOUTH EVERY DAY   buPROPion (WELLBUTRIN XL) 300 MG 24 hr tablet Take 1 tablet (300 mg total) by mouth daily.   busPIRone (BUSPAR) 7.5 MG tablet TAKE 1 TABLET BY MOUTH TWICE A DAY   clobetasol cream (TEMOVATE) 0.05 % Apply 1 application  topically 2 (two) times daily as needed (irritation).   ibuprofen (ADVIL,MOTRIN) 200 MG tablet Take 400 mg by mouth daily as needed for headache.   loratadine (CLARITIN) 10 MG tablet Take 10 mg by mouth daily.   Multiple Vitamins-Minerals (CENTRUM SILVER WOMEN 50+ PO) Take 1 tablet by mouth.   vitamin D3 (CHOLECALCIFEROL) 25 MCG tablet Take by mouth daily.     Allergies:    Chlorhexidine gluconate   Social History: Social History   Socioeconomic History   Marital status: Married    Spouse name: Not on file   Number of children: 2   Years of education: Not on file  Highest education level: Some college, no degree  Occupational History   Occupation: Retired Photographer  Tobacco Use   Smoking status: Former    Current packs/day: 0.00    Types: Cigarettes    Start date: 01/21/1974    Quit date: 01/22/2004    Years since quitting: 19.5   Smokeless tobacco: Never  Vaping Use   Vaping status: Never Used  Substance and Sexual Activity   Alcohol use: Yes    Alcohol/week: 15.0 standard drinks of alcohol    Types: 15 Standard drinks or equivalent per week    Comment: drinks 1-2 drinks daily   Drug use: Never   Sexual activity: Not Currently  Other Topics Concern   Not on file  Social History Narrative   HSG, college Grad. Married '71. 2 Daughters- '77,  '81. 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))   Social Determinants of Health   Financial Resource Strain: Low Risk  (04/21/2023)   Overall Financial Resource Strain (CARDIA)    Difficulty of Paying Living Expenses: Not hard at all  Food Insecurity: No Food Insecurity (04/21/2023)   Hunger Vital Sign    Worried About Running Out of Food in the Last Year: Never true    Ran Out of Food in the Last Year: Never true  Transportation Needs: No Transportation Needs (04/21/2023)   PRAPARE - Administrator, Civil Service (Medical): No    Lack of Transportation (Non-Medical): No  Physical Activity: Sufficiently Active (04/21/2023)   Exercise Vital Sign    Days of Exercise per Week: 4 days    Minutes of Exercise per Session: 60 min  Stress: No Stress Concern Present (04/21/2023)   [employer] of Occupational Health - Occupational Stress Questionnaire    Feeling of Stress : Not at all  Social Connections: Moderately Isolated (04/21/2023)   Social Connection and Isolation Panel [NHANES]    Frequency of Communication with Friends and Family: More than three times a week    Frequency of Social Gatherings with Friends and Family: Once a week    Attends Religious Services: Never    Database administrator or Organizations: No    Attends Engineer, structural: Never    Marital Status: Married     Family History: The patient's family history includes Alcohol abuse in her father; Heart attack in her mother; Hypertension in her father and mother. There is no history of Colon polyps, Colon cancer, Esophageal cancer, Stomach cancer, or Rectal cancer.  ROS:   All other ROS reviewed and negative. Pertinent positives noted in the HPI.     EKGs/Labs/Other Studies Reviewed:   The following studies were personally reviewed by me today:  EKG:  EKG is ordered today.    EKG Interpretation Date/Time:  Wednesday July 21 2023 14:23:47 EDT Ventricular Rate:  68 PR Interval:  170 QRS Duration:  86 QT Interval:  388 QTC Calculation: 412 R Axis:   57  Text Interpretation: Normal sinus rhythm Normal ECG Confirmed by Lennie Odor ([id number]) on 07/21/2023 2:46:52 PM   Recent Labs: 11/30/2022: Hemoglobin 10.9; Platelets 463.0 03/23/2023: ALT 20; BUN 12; Creatinine, Ser 0.74; Potassium 4.0; Sodium 135 04/21/2023: TSH 5.81   Recent Lipid Panel    Component Value Date/Time   CHOL 184 03/23/2023 0912   TRIG 73.0 03/23/2023 0912   TRIG 79 12/16/2006 0919   HDL 85.60 03/23/2023 0912  CHOLHDL 2 03/23/2023 0912   VLDL 14.6 03/23/2023 0912   LDLCALC 84 03/23/2023 0912   LDLDIRECT 168.5 07/30/2011 0739    Physical Exam:   VS:  BP 138/86   Pulse 68   Ht 5' 3.5" (1.613 m)   Wt 131 lb 6.4 oz (59.6 kg)   SpO2 100%   BMI 22.91 kg/m    Wt Readings from Last 3 Encounters:  07/21/23 131 lb 6.4 oz (59.6 kg)  07/14/23 130 lb 6.4 oz (59.1 kg)  04/01/23 135 lb 6.4 oz (61.4 kg)    General: Well nourished, well developed, in no acute distress Head: Atraumatic, normal size  Eyes: PEERLA, EOMI  Neck: Supple, no JVD Endocrine: No thryomegaly Cardiac: Normal S1, S2; RRR; 2 out of 6 systolic ejection murmur, S2 is clearly heard Lungs: Clear to auscultation bilaterally, no wheezing, rhonchi or rales  Abd: Soft, nontender, no hepatomegaly  Ext: No edema, pulses 2+ Musculoskeletal: No deformities, BUE and BLE strength normal and equal Skin: Warm and dry, no rashes   Neuro: Alert and oriented to person, place, time, and situation, CNII-XII grossly intact, no focal deficits  Psych: Normal mood and affect   ASSESSMENT:   Dana Bryan is a 72 y.o. female who presents for the following: 1. Bicuspid aortic valve   2. Aortic stenosis due to bicuspid aortic valve   3. LBBB (left bundle branch block)     PLAN:   1. Bicuspid aortic valve 2. Aortic stenosis due to bicuspid aortic valve -Moderate aortic stenosis on  transesophageal echo last year.  Murmur still consistent with moderate stenosis.  Repeat echo this year.  We will follow this yearly.  No symptoms of this.  3. LBBB (left bundle branch block) -EKG shows normal sinus rhythm.  Suspect her left bundle branch block is rate related.  She has it when her heart rate is in the 100s.  Disposition: Return in about 1 year (around 07/20/2024).  Medication Adjustments/Labs and Tests Ordered: Current medicines are reviewed at length with the patient today.  Concerns regarding medicines are outlined above.  Orders Placed This Encounter  Procedures   EKG 12-Lead   ECHOCARDIOGRAM COMPLETE   No orders of the defined types were placed in this encounter.  Patient Instructions  Medication Instructions:  NONE CHANGES *If you need a refill on your cardiac medications before your next appointment, please call your pharmacy*   Lab Work: NONE If you have labs (blood work) drawn today and your tests are completely normal, you will receive your results only by: MyChart Message (if you have MyChart) OR A paper copy in the mail If you have any lab test that is abnormal or we need to change your treatment, we will call you to review the results.  Testing/Procedures:  Your physician has requested that you have an echocardiogram. Echocardiography is a painless test that uses sound waves to create images of your heart. It provides your doctor with information about the size and shape of your heart and how well your heart's chambers and valves are working. This procedure takes approximately one hour. There are no restrictions for this procedure. Please do NOT wear cologne, perfume, aftershave, or lotions (deodorant is allowed). Please arrive 15 minutes prior to your appointment time.    Follow-Up: At [facility], you and your health needs are our priority.  As part of our continuing mission to provide you with exceptional heart care, we have created  designated Provider Care Teams.  These  Care Teams include your primary Cardiologist (physician) and Advanced Practice Providers (APPs -  Physician Assistants and Nurse Practitioners) who all work together to provide you with the care you need, when you need it.  Your next appointment:   1 year(s)  Provider:   Reatha Harps, MD        Time Spent with Patient: I have spent a total of 25 minutes with patient reviewing hospital notes, telemetry, EKGs, labs and examining the patient as well as establishing an assessment and plan that was discussed with the patient.  > 50% of time was spent in direct patient care.  Signed, Lenna Gilford. Flora Lipps, MD, [facility]  [facility]  [address] [place], [place] [address] [phone]  07/21/2023 2:49 PM

## 2023-07-21 ENCOUNTER — Ambulatory Visit: Payer: Medicare HMO | Attending: Cardiovascular Disease | Admitting: Cardiovascular Disease

## 2023-07-21 ENCOUNTER — Encounter: Payer: Self-pay | Admitting: Cardiovascular Disease

## 2023-07-21 VITALS — BP 138/86 | HR 68 | Ht 63.5 in | Wt 131.4 lb

## 2023-07-21 DIAGNOSIS — I447 Left bundle-branch block, unspecified: Secondary | ICD-10-CM | POA: Diagnosis not present

## 2023-07-21 DIAGNOSIS — Q230 Congenital stenosis of aortic valve: Secondary | ICD-10-CM

## 2023-07-21 DIAGNOSIS — Q231 Congenital insufficiency of aortic valve: Secondary | ICD-10-CM

## 2023-07-21 NOTE — Patient Instructions (Addendum)
Medication Instructions:  NONE CHANGES *If you need a refill on your cardiac medications before your next appointment, please call your pharmacy*   Lab Work: NONE If you have labs (blood work) drawn today and your tests are completely normal, you will receive your results only by: MyChart Message (if you have MyChart) OR A paper copy in the mail If you have any lab test that is abnormal or we need to change your treatment, we will call you to review the results.  Testing/Procedures:  Your physician has requested that you have an echocardiogram. Echocardiography is a painless test that uses sound waves to create images of your heart. It provides your doctor with information about the size and shape of your heart and how well your heart's chambers and valves are working. This procedure takes approximately one hour. There are no restrictions for this procedure. Please do NOT wear cologne, perfume, aftershave, or lotions (deodorant is allowed). Please arrive 15 minutes prior to your appointment time.    Follow-Up: At [facility], you and your health needs are our priority.  As part of our continuing mission to provide you with exceptional heart care, we have created designated Provider Care Teams.  These Care Teams include your primary Cardiologist (physician) and Advanced Practice Providers (APPs -  Physician Assistants and Nurse Practitioners) who all work together to provide you with the care you need, when you need it.  Your next appointment:   1 year(s)  Provider:   Reatha Harps, MD

## 2023-08-05 ENCOUNTER — Ambulatory Visit (HOSPITAL_COMMUNITY)
Admission: RE | Admit: 2023-08-05 | Discharge: 2023-08-05 | Disposition: A | Discharge status: Home / Self Care | Payer: Medicare HMO | Source: Ambulatory Visit | Attending: Nurse Practitioner | Admitting: Nurse Practitioner

## 2023-08-05 ENCOUNTER — Encounter (HOSPITAL_COMMUNITY): Payer: Self-pay

## 2023-08-05 VITALS — BP 169/78 | HR 71 | Temp 98.0°F | Resp 18

## 2023-08-05 DIAGNOSIS — U071 COVID-19: Secondary | ICD-10-CM

## 2023-08-05 MED ORDER — BENZONATATE 100 MG PO CAPS: 100.0000 mg | ORAL_CAPSULE | Freq: Three times a day (TID) | ORAL | 0 refills | Status: DC | PRN

## 2023-08-05 MED ORDER — PAXLOVID (300/100) 20 X 150 MG & 10 X 100MG PO TBPK: 3.0000 | ORAL_TABLET | Freq: Two times a day (BID) | ORAL | 0 refills | Status: AC

## 2023-08-05 NOTE — Discharge Instructions (Addendum)
Your symptoms are consistent with COVID-19.  Symptoms should improve over the next week to 10 days.  If you develop chest pain or shortness of breath, go to the emergency room.  Start taking the Paxlovid this evening.  Decrease buspirone to once daily while taking Paxlovid.  Do not take alprazolam, atorvastatin, or Vitamin C while taking Paxlovid.   Some other things that can make you feel better are: - Increased rest - Increasing fluid with water/sugar free electrolytes - Acetaminophen and ibuprofen as needed for fever/pain - Salt water gargling, chloraseptic spray and throat lozenges for sore throat - OTC guaifenesin (Mucinex) 600 mg twice daily for congestion - Saline sinus flushes or a neti pot - Humidifying the air -Tessalon Perles every 8 hours as needed for dry cough

## 2023-08-05 NOTE — ED Triage Notes (Signed)
Pt c/o cough, nasal congestion, and fatigue since Tuesday. States positive home COVID test yesterday.

## 2023-08-05 NOTE — ED Provider Notes (Signed)
[facility]    CSN: [id number] Arrival date & time: 08/05/23  1144      History   Chief Complaint Chief Complaint  Patient presents with   Cough    I tested positive for Covid yesterday evening.  I had pneumonia in November of last year and want to take medication to keep the condition to becoming severe. - Entered by patient   Covid Positive    HPI Dana Bryan is a 72 y.o. female.   Patient presents today with 3-day history of congested cough, runny nose, scratchy throat, and fatigue.  Also has chest "heaviness" when she coughs.  She denies known fever, body aches or chills, shortness of breath, or current chest pain.  No headache, ear pain, abdominal pain, nausea/vomiting, or diarrhea.  No loss of taste or smell.  No known sick contacts.  Has been taking over-the-counter cough, sneezing, and runny nose medicine with relief.  She is wondering if she is a candidate for antiviral today.  Reports she has been drinking lots of coffee today.  Also concerned about developing pneumonia.  Reports she had pneumonia last year.  COVID-19 test at home yesterday positive.    Past Medical History:  Diagnosis Date   Anxiety    on meds   Basal cell carcinoma    Cancer (HCC)    Cataract    no sx    Generalized headaches    Heart murmur    per Dr.Tabori   Hyperlipidemia    on meds   Reactive depression (situational)    on meds   Squamous cell skin cancer, finger    Vasomotor rhinitis     Patient Active Problem List   Diagnosis Date Noted   Community acquired pneumonia with pleural effusion 11/10/2022   Electrolyte abnormality 11/10/2022   Aortic stenosis 07/23/2022   LBBB (left bundle branch block) 06/14/2022   Lichen sclerosus 01/07/2022   Osteopenia 09/09/2021   Heart murmur 01/23/2020   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   Routine health maintenance 08/06/2011   B12 deficiency 07/18/2008   Hyperlipidemia 02/15/2008   Anxiety and depression 02/15/2008     Past Surgical History:  Procedure Laterality Date   BUBBLE STUDY  09/04/2022   Procedure: BUBBLE STUDY;  Surgeon: Sande Rives, MD;  Location: [facility] ENDOSCOPY;  Service: Cardiovascular;;   COLONOSCOPY  2008   normal    TEE WITHOUT CARDIOVERSION N/A 09/04/2022   Procedure: TRANSESOPHAGEAL ECHOCARDIOGRAM (TEE);  Surgeon: Sande Rives, MD;  Location: [facility] ENDOSCOPY;  Service: Cardiovascular;  Laterality: N/A;   TONSILLECTOMY  1973    OB History   No obstetric history on file.      Home Medications    Prior to Admission medications   Medication Sig Start Date End Date Taking? Authorizing Provider  benzonatate (TESSALON) 100 MG capsule Take 1 capsule (100 mg total) by mouth 3 (three) times daily as needed for cough. Do not take with alcohol or while driving or operating heavy machinery.  May cause drowsiness. 08/05/23  Yes Valentino Nose, NP  nirmatrelvir & ritonavir (PAXLOVID, 300/100,) 20 x 150 MG & 10 x 100MG  TBPK Take 3 tablets by mouth 2 (two) times daily for 5 days. 08/05/23 08/10/23 Yes Valentino Nose, NP  ALPRAZolam Prudy Feeler) 0.25 MG tablet Take 1 tablet (0.25 mg total) by mouth daily as needed. Patient taking differently: Take 0.25 mg by mouth daily as needed for anxiety. 06/11/22   Sheliah Hatch, MD  Ascorbic Acid (VITAMIN C  PO) Take 500 mg by mouth daily.    [provider]  atorvastatin (LIPITOR) 20 MG tablet TAKE 1 TABLET BY MOUTH EVERY DAY 05/14/23   Sheliah Hatch, MD  buPROPion (WELLBUTRIN XL) 300 MG 24 hr tablet Take 1 tablet (300 mg total) by mouth daily. 12/17/22   Sheliah Hatch, MD  busPIRone (BUSPAR) 7.5 MG tablet TAKE 1 TABLET BY MOUTH TWICE A DAY 05/10/23   Sheliah Hatch, MD  clobetasol cream (TEMOVATE) 0.05 % Apply 1 application  topically 2 (two) times daily as needed (irritation).    [provider]  ibuprofen (ADVIL,MOTRIN) 200 MG tablet Take 400 mg by mouth daily as needed for headache.    [provider]  loratadine (CLARITIN) 10 MG tablet Take 10 mg by mouth daily.    [provider]  Multiple Vitamins-Minerals (CENTRUM SILVER WOMEN 50+ PO) Take 1 tablet by mouth.    [provider]  vitamin D3 (CHOLECALCIFEROL) 25 MCG tablet Take by mouth daily.    [provider]    Family History Family History  Problem Relation Age of Onset   Heart attack Mother    Hypertension Mother    Hypertension Father    Alcohol abuse Father    Colon polyps Neg Hx    Colon cancer Neg Hx    Esophageal cancer Neg Hx    Stomach cancer Neg Hx    Rectal cancer Neg Hx     Social History Social History   Tobacco Use   Smoking status: Former    Current packs/day: 0.00    Types: Cigarettes    Start date: 01/21/1974    Quit date: 01/22/2004    Years since quitting: 19.5   Smokeless tobacco: Never  Vaping Use   Vaping status: Never Used  Substance Use Topics   Alcohol use: Yes    Alcohol/week: 15.0 standard drinks of alcohol    Types: 15 Standard drinks or equivalent per week    Comment: drinks 1-2 drinks daily   Drug use: Never     Allergies   Chlorhexidine gluconate   Review of Systems Review of Systems Per HPI  Physical Exam Triage Vital Signs ED Triage Vitals  Encounter Vitals Group     BP 08/05/23 1205 (!) 169/78     Systolic BP Percentile --      Diastolic BP Percentile --      Pulse Rate 08/05/23 1205 71     Resp 08/05/23 1205 18     Temp 08/05/23 1205 98 F (36.7 C)     Temp Source 08/05/23 1205 Oral     SpO2 08/05/23 1205 96 %     Weight --      Height --      Head Circumference --      Peak Flow --      Pain Score 08/05/23 1206 0     Pain Loc --      Pain Education --      Exclude from Growth Chart --    No data found.  Updated Vital Signs BP (!) 169/78 (BP Location: Left Arm)   Pulse 71   Temp 98 F (36.7 C) (Oral)   Resp 18   SpO2 96%   Visual Acuity Right Eye Distance:   Left Eye Distance:   Bilateral Distance:     Right Eye Near:   Left Eye Near:    Bilateral Near:     Physical Exam Vitals and nursing  note reviewed.  Constitutional:      General: She is not in acute distress.    Appearance: Normal appearance. She is not ill-appearing or toxic-appearing.  HENT:     Head: Normocephalic and atraumatic.     Right Ear: Tympanic membrane, ear canal and external ear normal.     Left Ear: Tympanic membrane, ear canal and external ear normal.     Nose: Congestion present. No rhinorrhea.     Mouth/Throat:     Mouth: Mucous membranes are moist.     Pharynx: Oropharynx is clear. No oropharyngeal exudate or posterior oropharyngeal erythema.  Eyes:     General: No scleral icterus.    Extraocular Movements: Extraocular movements intact.  Cardiovascular:     Rate and Rhythm: Normal rate and regular rhythm.     Heart sounds: Murmur heard.  Pulmonary:     Effort: Pulmonary effort is normal. No respiratory distress.     Breath sounds: Normal breath sounds. No wheezing, rhonchi or rales.  Abdominal:     General: Abdomen is flat. Bowel sounds are normal. There is no distension.     Palpations: Abdomen is soft.  Musculoskeletal:     Cervical back: Normal range of motion and neck supple.  Lymphadenopathy:     Cervical: No cervical adenopathy.  Skin:    General: Skin is warm and dry.     Coloration: Skin is not jaundiced or pale.     Findings: No erythema or rash.  Neurological:     Mental Status: She is alert and oriented to person, place, and time.     Motor: No weakness.  Psychiatric:        Behavior: Behavior is cooperative.      UC Treatments / Results  Labs (all labs ordered are listed, but only abnormal results are displayed) Labs Reviewed - No data to display  EKG   Radiology No results found.  Procedures Procedures (including critical care time)  Medications Ordered in UC Medications - No data to display  Initial Impression / Assessment and Plan / UC Course  I have reviewed  the triage vital signs and the nursing notes.  Pertinent labs & imaging results that were available during my care of the patient were reviewed by me and considered in my medical decision making (see chart for details).   Patient is well-appearing, afebrile, not tachycardic, not tachypneic, oxygenating well on room air.  Patient is mildly hypertensive in urgent care today.  1. Positive self-administered antigen test for COVID-19 Vitals and exam today are reassuring Discussed that symptoms are consistent with COVID-19 Supportive care discussed Start Paxlovid, recent GFR April 2024 82 Plan to hold atorvastatin, alprazolam, vitamin C while on therapy.  Recommended decreasing buspirone to once daily while on therapy. Start [employer] as needed for dry cough Other supportive care discussed and isolation criteria discussed per CDC guidelines Strict ER and return precautions discussed with patient  The patient was given the opportunity to ask questions.  All questions answered to their satisfaction.  The patient is in agreement to this plan.    Final Clinical Impressions(s) / UC Diagnoses   Final diagnoses:  Positive self-administered antigen test for COVID-19     Discharge Instructions      Your symptoms are consistent with COVID-19.  Symptoms should improve over the next week to 10 days.  If you develop chest pain or shortness of breath, go to the emergency room.  Start taking the Paxlovid this evening.  Decrease  buspirone to once daily while taking Paxlovid.  Do not take alprazolam, atorvastatin, or Vitamin C while taking Paxlovid.   Some other things that can make you feel better are: - Increased rest - Increasing fluid with water/sugar free electrolytes - Acetaminophen and ibuprofen as needed for fever/pain - Salt water gargling, chloraseptic spray and throat lozenges for sore throat - OTC guaifenesin (Mucinex) 600 mg twice daily for congestion - Saline sinus flushes or a neti  pot - Humidifying the air -Tessalon Perles every 8 hours as needed for dry cough    ED Prescriptions     Medication Sig Dispense Auth. Provider   nirmatrelvir & ritonavir (PAXLOVID, 300/100,) 20 x 150 MG & 10 x 100MG  TBPK Take 3 tablets by mouth 2 (two) times daily for 5 days. 30 tablet Cathlean Marseilles A, NP   benzonatate (TESSALON) 100 MG capsule Take 1 capsule (100 mg total) by mouth 3 (three) times daily as needed for cough. Do not take with alcohol or while driving or operating heavy machinery.  May cause drowsiness. 21 capsule Valentino Nose, NP      PDMP not reviewed this encounter.   Valentino Nose, NP 08/05/23 1234

## 2023-08-06 ENCOUNTER — Other Ambulatory Visit: Payer: Self-pay | Admitting: Family Medicine

## 2023-08-10 ENCOUNTER — Ambulatory Visit (HOSPITAL_COMMUNITY): Payer: Medicare HMO | Attending: Cardiology

## 2023-08-10 DIAGNOSIS — Q231 Congenital insufficiency of aortic valve: Secondary | ICD-10-CM | POA: Insufficient documentation

## 2023-08-10 DIAGNOSIS — I361 Nonrheumatic tricuspid (valve) insufficiency: Secondary | ICD-10-CM | POA: Diagnosis not present

## 2023-08-10 DIAGNOSIS — I082 Rheumatic disorders of both aortic and tricuspid valves: Secondary | ICD-10-CM

## 2023-08-10 DIAGNOSIS — Q230 Congenital stenosis of aortic valve: Secondary | ICD-10-CM | POA: Insufficient documentation

## 2023-08-10 LAB — ECHOCARDIOGRAM COMPLETE
AR max vel: 0.74 cm2
AV Area VTI: 0.68 cm2
AV Area mean vel: 0.68 cm2
AV Mean grad: 25.2 mmHg
AV Peak grad: 41.7 mmHg
Ao pk vel: 3.23 m/s
Area-P 1/2: 3.47 cm2
S' Lateral: 2.6 cm

## 2023-08-11 ENCOUNTER — Other Ambulatory Visit (HOSPITAL_COMMUNITY): Payer: Self-pay | Admitting: *Deleted

## 2023-08-11 DIAGNOSIS — R011 Cardiac murmur, unspecified: Secondary | ICD-10-CM

## 2023-08-11 DIAGNOSIS — Q230 Congenital stenosis of aortic valve: Secondary | ICD-10-CM

## 2023-08-11 DIAGNOSIS — Q231 Congenital insufficiency of aortic valve: Secondary | ICD-10-CM

## 2023-08-27 ENCOUNTER — Encounter: Payer: Self-pay | Admitting: Gastroenterology

## 2023-09-06 ENCOUNTER — Other Ambulatory Visit: Payer: Self-pay | Admitting: Family Medicine

## 2023-09-06 DIAGNOSIS — R011 Cardiac murmur, unspecified: Secondary | ICD-10-CM | POA: Diagnosis not present

## 2023-09-06 DIAGNOSIS — F32A Depression, unspecified: Secondary | ICD-10-CM | POA: Diagnosis not present

## 2023-09-06 DIAGNOSIS — Z8249 Family history of ischemic heart disease and other diseases of the circulatory system: Secondary | ICD-10-CM | POA: Diagnosis not present

## 2023-09-06 DIAGNOSIS — I2510 Atherosclerotic heart disease of native coronary artery without angina pectoris: Secondary | ICD-10-CM | POA: Diagnosis not present

## 2023-09-06 DIAGNOSIS — E785 Hyperlipidemia, unspecified: Secondary | ICD-10-CM | POA: Diagnosis not present

## 2023-09-06 DIAGNOSIS — Z87891 Personal history of nicotine dependence: Secondary | ICD-10-CM | POA: Diagnosis not present

## 2023-09-06 DIAGNOSIS — F411 Generalized anxiety disorder: Secondary | ICD-10-CM | POA: Diagnosis not present

## 2023-09-06 DIAGNOSIS — L900 Lichen sclerosus et atrophicus: Secondary | ICD-10-CM | POA: Diagnosis not present

## 2023-09-06 DIAGNOSIS — L309 Dermatitis, unspecified: Secondary | ICD-10-CM | POA: Diagnosis not present

## 2023-09-06 DIAGNOSIS — N393 Stress incontinence (female) (male): Secondary | ICD-10-CM | POA: Diagnosis not present

## 2023-09-23 ENCOUNTER — Encounter: Payer: Self-pay | Admitting: Family Medicine

## 2023-09-23 ENCOUNTER — Ambulatory Visit (INDEPENDENT_AMBULATORY_CARE_PROVIDER_SITE_OTHER): Payer: Medicare HMO | Admitting: Family Medicine

## 2023-09-23 VITALS — BP 136/78 | HR 62 | Temp 97.9°F | Ht 63.19 in | Wt 129.8 lb

## 2023-09-23 DIAGNOSIS — E785 Hyperlipidemia, unspecified: Secondary | ICD-10-CM | POA: Diagnosis not present

## 2023-09-23 DIAGNOSIS — Z0000 Encounter for general adult medical examination without abnormal findings: Secondary | ICD-10-CM | POA: Diagnosis not present

## 2023-09-23 LAB — CBC WITH DIFFERENTIAL/PLATELET
Basophils Absolute: 0 10*3/uL (ref 0.0–0.1)
Basophils Relative: 0.3 % (ref 0.0–3.0)
Eosinophils Absolute: 0.1 10*3/uL (ref 0.0–0.7)
Eosinophils Relative: 1.5 % (ref 0.0–5.0)
HCT: 37.2 % (ref 36.0–46.0)
Hemoglobin: 12.3 g/dL (ref 12.0–15.0)
Lymphocytes Relative: 27.4 % (ref 12.0–46.0)
Lymphs Abs: 1.4 10*3/uL (ref 0.7–4.0)
MCHC: 33 g/dL (ref 30.0–36.0)
MCV: 92.8 fL (ref 78.0–100.0)
Monocytes Absolute: 0.4 10*3/uL (ref 0.1–1.0)
Monocytes Relative: 8.6 % (ref 3.0–12.0)
Neutro Abs: 3.2 10*3/uL (ref 1.4–7.7)
Neutrophils Relative %: 62.2 % (ref 43.0–77.0)
Platelets: 245 10*3/uL (ref 150.0–400.0)
RBC: 4.01 Mil/uL (ref 3.87–5.11)
RDW: 14.7 % (ref 11.5–15.5)
WBC: 5.1 10*3/uL (ref 4.0–10.5)

## 2023-09-23 LAB — LIPID PANEL
Cholesterol: 150 mg/dL (ref 0–200)
HDL: 83.3 mg/dL (ref >39.00)
LDL Cholesterol: 56 mg/dL (ref 0–99)
NonHDL: 66.55
Total CHOL/HDL Ratio: 2
Triglycerides: 55 mg/dL (ref 0.0–149.0)
VLDL: 11 mg/dL (ref 0.0–40.0)

## 2023-09-23 LAB — BASIC METABOLIC PANEL
BUN: 14 mg/dL (ref 6–23)
CO2: 29 meq/L (ref 19–32)
Calcium: 9.3 mg/dL (ref 8.4–10.5)
Chloride: 102 meq/L (ref 96–112)
Creatinine, Ser: 0.7 mg/dL (ref 0.40–1.20)
GFR: 87.46 mL/min (ref >60.00)
Glucose, Bld: 81 mg/dL (ref 70–99)
Potassium: 3.7 meq/L (ref 3.5–5.1)
Sodium: 137 meq/L (ref 135–145)

## 2023-09-23 LAB — HEPATIC FUNCTION PANEL
ALT: 17 U/L (ref 0–35)
AST: 16 U/L (ref 0–37)
Albumin: 4.1 g/dL (ref 3.5–5.2)
Alkaline Phosphatase: 107 U/L (ref 39–117)
Bilirubin, Direct: 0.1 mg/dL (ref 0.0–0.3)
Total Bilirubin: 0.6 mg/dL (ref 0.2–1.2)
Total Protein: 6.7 g/dL (ref 6.0–8.3)

## 2023-09-23 LAB — TSH: TSH: 4.22 u[IU]/mL (ref 0.35–5.50)

## 2023-09-23 NOTE — Patient Instructions (Signed)
Follow up in 6 months to recheck cholesterol We'll notify you of your lab results and make any changes if needed Continue to work on healthy diet and regular exercise- you look great! Call with any questions or concerns Stay Safe!  Stay Healthy! Happy Fall!!!

## 2023-09-23 NOTE — Assessment & Plan Note (Signed)
Chronic problem.  Tolerating statin w/o difficulty.  Check labs.  Adjust meds prn  

## 2023-09-23 NOTE — Progress Notes (Signed)
   Subjective:    Patient ID: Dana Bryan, female    DOB: 24-Apr-1951, 72 y.o.   MRN: [id number]  HPI CPE- UTD on colonoscopy, mammo, Tdap, PNA, flu  Patient Care Team    Relationship Specialty Notifications Start End  Sheliah Hatch, MD PCP - General Family Medicine  01/06/19   O'Neal, Ronnald Ramp, MD PCP - Cardiology Cardiology  06/19/22   Huel Cote, MD Consulting Physician Obstetrics and Gynecology  01/06/19   Armbruster, Willaim Rayas, MD Consulting Physician Gastroenterology  01/23/20   Dahlia Byes, [facility] (Inactive) Pharmacist Pharmacist  08/04/21    Comment: [phone]    Health Maintenance  Topic Date Due   Colonoscopy  11/22/2023   MAMMOGRAM  10/01/2023   Medicare Annual Wellness (AWV)  04/20/2024   DTaP/Tdap/Td (2 - Td or Tdap) 01/06/2026   Pneumonia Vaccine 29+ Years old  Completed   INFLUENZA VACCINE  Completed   DEXA SCAN  Completed   Hepatitis C Screening  Completed   Zoster Vaccines- Shingrix  Completed   HPV VACCINES  Aged Out   COVID-19 Vaccine  Discontinued      Review of Systems Patient reports no vision/ hearing changes, adenopathy,fever, weight change,  persistant/recurrent hoarseness , swallowing issues, chest pain, palpitations, edema, persistant/recurrent cough, hemoptysis, dyspnea (rest/exertional/paroxysmal nocturnal), gastrointestinal bleeding (melena, rectal bleeding), abdominal pain, significant heartburn, bowel changes, GU symptoms (dysuria, hematuria, incontinence), Gyn symptoms (abnormal  bleeding, pain),  syncope, focal weakness, memory loss, numbness & tingling, skin/hair/nail changes, abnormal bruising or bleeding, anxiety, or depression.     Objective:   Physical Exam General Appearance:    Alert, cooperative, no distress, appears stated age  Head:    Normocephalic, without obvious abnormality, atraumatic  Eyes:    PERRL, conjunctiva/corneas clear, EOM's intact both eyes  Ears:    Normal TM's and external ear canals, both ears   Nose:   Nares normal, septum midline, mucosa normal, no drainage    or sinus tenderness  Throat:   Lips, mucosa, and tongue normal; teeth and gums normal  Neck:   Supple, symmetrical, trachea midline, no adenopathy;    Thyroid: no enlargement/tenderness/nodules  Back:     Symmetric, no curvature, ROM normal, no CVA tenderness  Lungs:     Clear to auscultation bilaterally, respirations unlabored  Chest Wall:    No tenderness or deformity   Heart:    Regular rate and rhythm, S1 and S2 normal, II/VI SEM  Breast Exam:    Deferred to GYN  Abdomen:     Soft, non-tender, bowel sounds active all four quadrants,    no masses, no organomegaly  Genitalia:    Deferred to GYN  Rectal:    Extremities:   Extremities normal, atraumatic, no cyanosis or edema  Pulses:   2+ and symmetric all extremities  Skin:   Skin color, texture, turgor normal, no rashes or lesions  Lymph nodes:   Cervical, supraclavicular, and axillary nodes normal  Neurologic:   CNII-XII intact, normal strength, sensation and reflexes    throughout          Assessment & Plan:

## 2023-09-23 NOTE — Assessment & Plan Note (Signed)
Pt's PE WNL w/ exception of known murmur.  UTD on mammo, colonoscopy, Tdap, PNA, flu.  Check labs.  Anticipatory guidance provided.

## 2023-09-24 ENCOUNTER — Telehealth: Payer: Self-pay

## 2023-09-24 ENCOUNTER — Other Ambulatory Visit: Payer: Self-pay | Admitting: Family Medicine

## 2023-09-24 DIAGNOSIS — Z1231 Encounter for screening mammogram for malignant neoplasm of breast: Secondary | ICD-10-CM

## 2023-09-24 NOTE — Telephone Encounter (Signed)
-----   Message from Neena Rhymes sent at 09/24/2023 12:36 PM EDT ----- Labs look great!  No changes at this time

## 2023-09-27 NOTE — Telephone Encounter (Signed)
Patient reviewed 09/26/23 at 8:21am no further action necessary

## 2023-09-30 DIAGNOSIS — L570 Actinic keratosis: Secondary | ICD-10-CM | POA: Diagnosis not present

## 2023-09-30 DIAGNOSIS — L821 Other seborrheic keratosis: Secondary | ICD-10-CM | POA: Diagnosis not present

## 2023-10-06 ENCOUNTER — Telehealth: Payer: Self-pay | Admitting: Family Medicine

## 2023-10-27 ENCOUNTER — Telehealth: Payer: Self-pay | Admitting: *Deleted

## 2023-10-27 NOTE — Telephone Encounter (Signed)
Hi John , Does this pt's AS meet criteria?

## 2023-10-28 ENCOUNTER — Ambulatory Visit: Payer: Medicare HMO

## 2023-10-29 ENCOUNTER — Ambulatory Visit
Admission: RE | Admit: 2023-10-29 | Discharge: 2023-10-29 | Disposition: A | Discharge status: Home / Self Care | Payer: Medicare HMO | Source: Ambulatory Visit | Attending: Family Medicine | Admitting: Family Medicine

## 2023-10-29 DIAGNOSIS — Z1231 Encounter for screening mammogram for malignant neoplasm of breast: Secondary | ICD-10-CM | POA: Diagnosis not present

## 2023-11-01 ENCOUNTER — Ambulatory Visit (AMBULATORY_SURGERY_CENTER): Payer: Medicare HMO | Admitting: *Deleted

## 2023-11-01 VITALS — Ht 63.0 in | Wt 128.0 lb

## 2023-11-01 DIAGNOSIS — Z860100 Personal history of colon polyps, unspecified: Secondary | ICD-10-CM

## 2023-11-01 MED ORDER — NA SULFATE-K SULFATE-MG SULF 17.5-3.13-1.6 GM/177ML PO SOLN
1.0000 | Freq: Once | ORAL | 0 refills | Status: AC
Start: 2023-11-01 — End: 2023-11-01

## 2023-11-01 NOTE — Progress Notes (Signed)
Pre visit completed over telephone.  Instructions forwarded through MyChart.    No egg or soy allergy known to patient  No issues known to pt with past sedation with any surgeries or procedures Patient denies ever being told they had issues or difficulty with intubation  No FH of Malignant Hyperthermia Pt is not on diet pills Pt is not on  home 02  Pt is not on blood thinners  Pt denies issues with constipation  No A fib or A flutter Have any cardiac testing pending-NO Pt instructed to use [email or web address] or GoodRx for a price reduction on prep   

## 2023-11-05 ENCOUNTER — Other Ambulatory Visit: Payer: Self-pay | Admitting: Family Medicine

## 2023-11-12 ENCOUNTER — Encounter: Payer: Self-pay | Admitting: Gastroenterology

## 2023-11-30 ENCOUNTER — Ambulatory Visit: Payer: Medicare HMO | Admitting: Gastroenterology

## 2023-11-30 ENCOUNTER — Encounter: Payer: Self-pay | Admitting: Gastroenterology

## 2023-11-30 VITALS — BP 136/66 | HR 69 | Temp 97.0°F | Resp 12 | Ht 63.0 in | Wt 128.0 lb

## 2023-11-30 DIAGNOSIS — D123 Benign neoplasm of transverse colon: Secondary | ICD-10-CM

## 2023-11-30 DIAGNOSIS — D124 Benign neoplasm of descending colon: Secondary | ICD-10-CM

## 2023-11-30 DIAGNOSIS — Z1211 Encounter for screening for malignant neoplasm of colon: Secondary | ICD-10-CM | POA: Diagnosis not present

## 2023-11-30 DIAGNOSIS — Z860101 Personal history of adenomatous and serrated colon polyps: Secondary | ICD-10-CM

## 2023-11-30 DIAGNOSIS — K648 Other hemorrhoids: Secondary | ICD-10-CM

## 2023-11-30 DIAGNOSIS — Z860100 Personal history of colon polyps, unspecified: Secondary | ICD-10-CM | POA: Diagnosis not present

## 2023-11-30 DIAGNOSIS — K6289 Other specified diseases of anus and rectum: Secondary | ICD-10-CM | POA: Diagnosis not present

## 2023-11-30 DIAGNOSIS — F419 Anxiety disorder, unspecified: Secondary | ICD-10-CM | POA: Diagnosis not present

## 2023-11-30 DIAGNOSIS — Q439 Congenital malformation of intestine, unspecified: Secondary | ICD-10-CM | POA: Diagnosis not present

## 2023-11-30 HISTORY — PX: COLONOSCOPY WITH PROPOFOL: SHX5780

## 2023-11-30 MED ORDER — SODIUM CHLORIDE 0.9 % IV SOLN: 500.0000 mL | Freq: Once | INTRAVENOUS | Status: DC

## 2023-11-30 NOTE — Op Note (Signed)
[facility] Patient Name: Dana Bryan Procedure Date: 11/30/2023 8:19 AM MRN: [id number] Endoscopist: Viviann Spare P. Adela Lank , MD, [id number] Age: 72 Referring MD:  Date of Birth: 06-16-1951 Gender: Female Account #: [id number] Procedure:                Colonoscopy Indications:              High risk colon cancer surveillance: Personal                            history of colonic polyps - 4 polyps removed                            11/2020 - adenoma / sessile serrated Medicines:                Monitored Anesthesia Care Procedure:                Pre-Anesthesia Assessment:                           - Prior to the procedure, a History and Physical                            was performed, and patient medications and                            allergies were reviewed. The patient's tolerance of                            previous anesthesia was also reviewed. The risks                            and benefits of the procedure and the sedation                            options and risks were discussed with the patient.                            All questions were answered, and informed consent                            was obtained. Prior Anticoagulants: The patient has                            taken no anticoagulant or antiplatelet agents. ASA                            Grade Assessment: II - A patient with mild systemic                            disease. After reviewing the risks and benefits,                            the patient was deemed in satisfactory condition to  undergo the procedure.                           After obtaining informed consent, the colonoscope                            was passed under direct vision. Throughout the                            procedure, the patient's blood pressure, pulse, and                            oxygen saturations were monitored continuously. The                            Olympus Scope SN  [id number] was introduced through the                            anus and advanced to the the cecum, identified by                            appendiceal orifice and ileocecal valve. The                            colonoscopy was performed without difficulty. The                            patient tolerated the procedure well. The quality                            of the bowel preparation was adequate. The                            ileocecal valve, appendiceal orifice, and rectum                            were photographed. Scope In: 8:29:09 AM Scope Out: 8:52:24 AM Scope Withdrawal Time: 0 hours 17 minutes 26 seconds  Total Procedure Duration: 0 hours 23 minutes 15 seconds  Findings:                 The perianal and digital rectal examinations were                            normal.                           A few small-mouthed diverticula were found in the                            sigmoid colon and ascending colon.                           A 3 mm polyp was found in the splenic flexure. The  polyp was sessile. The polyp was removed with a                            cold snare. Resection and retrieval were complete.                           A 3 mm polyp was found in the descending colon. The                            polyp was sessile. The polyp was removed with a                            cold snare. Resection and retrieval were complete.                           The colon was tortuous with angulated turns in the                            sigmoid colon.                           Internal hemorrhoids were found during retroflexion                            along with hypertrophied anal papillae. The                            hemorrhoids were small.                           The exam was otherwise without abnormality. Complications:            No immediate complications. Estimated blood loss:                            Minimal. Estimated Blood Loss:      Estimated blood loss was minimal. Impression:               - Diverticulosis in the sigmoid colon and in the                            ascending colon.                           - One 3 mm polyp at the splenic flexure, removed                            with a cold snare. Resected and retrieved.                           - One 3 mm polyp in the descending colon, removed                            with a cold snare. Resected and retrieved.                           -  Tortuous colon.                           - Internal hemorrhoids.                           - Hypertrophied anal papillae.                           - The examination was otherwise normal. Recommendation:           - Patient has a contact number available for                            emergencies. The signs and symptoms of potential                            delayed complications were discussed with the                            patient. Return to normal activities tomorrow.                            Written discharge instructions were provided to the                            patient.                           - Resume previous diet.                           - Continue present medications.                           - Await pathology results. Viviann Spare P. Adeline Petitfrere, MD 11/30/2023 8:56:53 AM This report has been signed electronically.

## 2023-11-30 NOTE — Progress Notes (Signed)
Pt's states no medical or surgical changes since previsit or office visit. 

## 2023-11-30 NOTE — Progress Notes (Signed)
Called to room to assist during endoscopic procedure.  Patient ID and intended procedure confirmed with present staff. Received instructions for my participation in the procedure from the performing physician.  

## 2023-11-30 NOTE — Progress Notes (Signed)
Sedate, gd SR, tolerated procedure well, VSS, report to RN 

## 2023-11-30 NOTE — Patient Instructions (Signed)
-   Resume previous diet - Continue present medications. - Await pathology results - Patient given educational handouts related to procedure.  YOU HAD AN ENDOSCOPIC PROCEDURE TODAY AT THE [facility]:   Refer to the procedure report that was given to you for any specific questions about what was found during the examination.  If the procedure report does not answer your questions, please call your gastroenterologist to clarify.  If you requested that your care partner not be given the details of your procedure findings, then the procedure report has been included in a sealed envelope for you to review at your convenience later.  YOU SHOULD EXPECT: Some feelings of bloating in the abdomen. Passage of more gas than usual.  Walking can help get rid of the air that was put into your GI tract during the procedure and reduce the bloating. If you had a lower endoscopy (such as a colonoscopy or flexible sigmoidoscopy) you may notice spotting of blood in your stool or on the toilet paper. If you underwent a bowel prep for your procedure, you may not have a normal bowel movement for a few days.  Please Note:  You might notice some irritation and congestion in your nose or some drainage.  This is from the oxygen used during your procedure.  There is no need for concern and it should clear up in a day or so.  SYMPTOMS TO REPORT IMMEDIATELY:  Following lower endoscopy (colonoscopy or flexible sigmoidoscopy):  Excessive amounts of blood in the stool  Significant tenderness or worsening of abdominal pains  Swelling of the abdomen that is new, acute  Fever of 100F or higher   For urgent or emergent issues, a gastroenterologist can be reached at any hour by calling [phone]. Do not use MyChart messaging for urgent concerns.    DIET:  We do recommend a small meal at first, but then you may proceed to your regular diet.  Drink plenty of fluids but you should avoid alcoholic beverages for 24  hours.  ACTIVITY:  You should plan to take it easy for the rest of today and you should NOT DRIVE or use heavy machinery until tomorrow (because of the sedation medicines used during the test).    FOLLOW UP: Our staff will call the number listed on your records the next business day following your procedure.  We will call around 7:15- 8:00 am to check on you and address any questions or concerns that you may have regarding the information given to you following your procedure. If we do not reach you, we will leave a message.     If any biopsies were taken you will be contacted by phone or by letter within the next 1-3 weeks.  Please call us at [phone] if you have not heard about the biopsies in 3 weeks.    SIGNATURES/CONFIDENTIALITY: You and/or your care partner have signed paperwork which will be entered into your electronic medical record.  These signatures attest to the fact that that the information above on your After Visit Summary has been reviewed and is understood.  Full responsibility of the confidentiality of this discharge information lies with you and/or your care-partner.

## 2023-11-30 NOTE — Progress Notes (Signed)
[facility] History and Physical   Primary Care Physician:  Dana Hatch, MD   Reason for Procedure:   History of colon polyps  Plan:    colonoscopy     HPI: Dana Bryan is a 72 y.o. female  here for colonoscopy surveillance - 4 polyps removed 11/2020 - TA and SSPs.   Patient denies any bowel symptoms at this time. No family history of colon cancer known. Otherwise feels well without any cardiopulmonary symptoms.   I have discussed risks / benefits of anesthesia and endoscopic procedure with Dana Bryan and they wish to proceed with the exams as outlined today.    Past Medical History:  Diagnosis Date   Anxiety    on meds   Basal cell carcinoma    Bicuspid aortic valve    Cancer (HCC)    Cataract    no sx    Generalized headaches    Heart murmur    per Dr.Tabori   Hyperlipidemia    on meds   Pneumonia    Reactive depression (situational)    on meds   Squamous cell skin cancer, finger    Vasomotor rhinitis     Past Surgical History:  Procedure Laterality Date   BUBBLE STUDY  09/04/2022   Procedure: BUBBLE STUDY;  Surgeon: Sande Rives, MD;  Location: [facility] END[facility]COPY;  Service: Cardiovascular;;   COLONOSCOPY  2008   normal    TEE WITHOUT CARDIOVERSION N/A 09/04/2022   Procedure: TRANSESOPHAGEAL ECHOCARDIOGRAM (TEE);  Surgeon: Sande Rives, MD;  Location: [facility] ENDOSCOPY;  Service: Cardiovascular;  Laterality: N/A;   TONSILLECTOMY  1973    Prior to Admission medications   Medication Sig Start Date End Date Taking? Authorizing Provider  Ascorbic Acid (VITAMIN C PO) Take 500 mg by mouth daily.   Yes [provider]  atorvastatin (LIPITOR) 20 MG tablet TAKE 1 TABLET BY MOUTH EVERY DAY 11/05/23  Yes Dana Hatch, MD  buPROPion (WELLBUTRIN XL) 300 MG 24 hr tablet Take 1 tablet (300 mg total) by mouth daily. 12/17/22  Yes Dana Hatch, MD  busPIRone (BUSPAR) 7.5 MG tablet TAKE 1 TABLET BY MOUTH  TWICE A DAY Patient taking differently: Take half tablet in the mornings and a whole tablet in the evenings 09/06/23  Yes Dana Hatch, MD  loratadine (CLARITIN) 10 MG tablet Take 10 mg by mouth daily.   Yes [provider]  Multiple Vitamins-Minerals (CENTRUM SILVER WOMEN 50+ PO) Take 1 tablet by mouth.   Yes [provider]  vitamin D3 (CHOLECALCIFEROL) 25 MCG tablet Take by mouth daily.   Yes [provider]  ALPRAZolam (XANAX) 0.25 MG tablet Take 1 tablet (0.25 mg total) by mouth daily as needed. Patient taking differently: Take 0.25 mg by mouth daily as needed for anxiety. 06/11/22   Dana Hatch, MD  benzonatate (TESSALON) 100 MG capsule Take 1 capsule (100 mg total) by mouth 3 (three) times daily as needed for cough. Do not take with alcohol or while driving or operating heavy machinery.  May cause drowsiness. 08/05/23   Dana Nose, NP  clobetasol cream (TEMOVATE) 0.05 % Apply 1 application  topically 2 (two) times daily as needed (irritation).    [provider]  ibuprofen (ADVIL,MOTRIN) 200 MG tablet Take 400 mg by mouth daily as needed for headache.    [provider]    Current Outpatient Medications  Medication Sig Dispense Refill   Ascorbic Acid (VITAMIN C PO) Take  500 mg by mouth daily.     atorvastatin (LIPITOR) 20 MG tablet TAKE 1 TABLET BY MOUTH EVERY DAY 90 tablet 0   buPROPion (WELLBUTRIN XL) 300 MG 24 hr tablet Take 1 tablet (300 mg total) by mouth daily. 90 tablet 1   busPIRone (BUSPAR) 7.5 MG tablet TAKE 1 TABLET BY MOUTH TWICE A DAY (Patient taking differently: Take half tablet in the mornings and a whole tablet in the evenings) 180 tablet 0   loratadine (CLARITIN) 10 MG tablet Take 10 mg by mouth daily.     Multiple Vitamins-Minerals (CENTRUM SILVER WOMEN 50+ PO) Take 1 tablet by mouth.     vitamin D3 (CHOLECALCIFEROL) 25 MCG tablet Take by mouth daily.     ALPRAZolam (XANAX) 0.25 MG tablet Take 1 tablet  (0.25 mg total) by mouth daily as needed. (Patient taking differently: Take 0.25 mg by mouth daily as needed for anxiety.) 30 tablet 0   benzonatate (TESSALON) 100 MG capsule Take 1 capsule (100 mg total) by mouth 3 (three) times daily as needed for cough. Do not take with alcohol or while driving or operating heavy machinery.  May cause drowsiness. 21 capsule 0   clobetasol cream (TEMOVATE) 0.05 % Apply 1 application  topically 2 (two) times daily as needed (irritation).     ibuprofen (ADVIL,MOTRIN) 200 MG tablet Take 400 mg by mouth daily as needed for headache.     Current Facility-Administered Medications  Medication Dose Route Frequency Provider Last Rate Last Admin   0.9 %  sodium chloride infusion  500 mL Intravenous Once Amy Belloso, Willaim Rayas, MD        Allergies as of 11/30/2023 - Review Complete 11/30/2023  Allergen Reaction Noted   Chlorhexidine gluconate Rash 06/17/2020    Family History  Problem Relation Age of Onset   Heart attack Mother    Hypertension Mother    Hypertension Father    Alcohol abuse Father    Colon polyps Neg Hx    Colon cancer Neg Hx    Esophageal cancer Neg Hx    Stomach cancer Neg Hx    Rectal cancer Neg Hx    Cancer - Colon Neg Hx     Social History   Socioeconomic History   Marital status: Married    Spouse name: Not on file   Number of children: 2   Years of education: Not on file   Highest education level: Some college, no degree  Occupational History   Occupation: Retired Photographer  Tobacco Use   Smoking status: Former    Current packs/day: 0.00    Types: Cigarettes    Start date: 01/21/1974    Quit date: 01/22/2004    Years since quitting: 19.8   Smokeless tobacco: Never  Vaping Use   Vaping status: Never Used  Substance and Sexual Activity   Alcohol use: Yes    Alcohol/week: 15.0 standard drinks of alcohol    Types: 15 Standard drinks or equivalent per week    Comment: drinks 1-2 drinks daily   Drug use: Never   Sexual activity:  Not Currently  Other Topics Concern   Not on file  Social History Narrative   HSG, college Grad. Married '71. 2 Daughters- '77, '81. 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  '11 has returned to work- out of the home ( [employer])   Social Determinants of Health   Financial Resource Strain: Low Risk  (04/21/2023)   Overall Financial Resource Strain (CARDIA)  Difficulty of Paying Living Expenses: Not hard at all  Food Insecurity: No Food Insecurity (04/21/2023)   Hunger Vital Sign    Worried About Running Out of Food in the Last Year: Never true    Ran Out of Food in the Last Year: Never true  Transportation Needs: No Transportation Needs (04/21/2023)   PRAPARE - Administrator, Civil Service (Medical): No    Lack of Transportation (Non-Medical): No  Physical Activity: Sufficiently Active (04/21/2023)   Exercise Vital Sign    Days of Exercise per Week: 4 days    Minutes of Exercise per Session: 60 min  Stress: No Stress Concern Present (04/21/2023)   [employer] of Occupational Health - Occupational Stress Questionnaire    Feeling of Stress : Not at all  Social Connections: Moderately Isolated (04/21/2023)   Social Connection and Isolation Panel [NHANES]    Frequency of Communication with Friends and Family: More than three times a week    Frequency of Social Gatherings with Friends and Family: Once a week    Attends Religious Services: Never    Database administrator or Organizations: No    Attends Banker Meetings: Never    Marital Status: Married  Catering manager Violence: Not At Risk (04/21/2023)   Humiliation, Afraid, Rape, and Kick questionnaire    Fear of Current or Ex-Partner: No    Emotionally Abused: No    Physically Abused: No    Sexually Abused: No    Review of Systems: All other review of systems negative except as mentioned in the HPI.  Physical Exam: Vital signs BP 139/68   Pulse 64   Temp (!) 97 F (36.1 C)  (Temporal)   Ht 5\' 3"  (1.6 m)   Wt 128 lb (58.1 kg)   SpO2 99%   BMI 22.67 kg/m   General:   Alert,  Well-developed, pleasant and cooperative in NAD Lungs:  Clear throughout to auscultation.   Heart:  Regular rate and rhythm Abdomen:  Soft, nontender and nondistended.   Neuro/Psych:  Alert and cooperative. Normal mood and affect. A and O x 3  Harlin Rain, MD [facility]

## 2023-12-01 ENCOUNTER — Telehealth: Payer: Self-pay

## 2023-12-01 NOTE — Telephone Encounter (Signed)
  Follow up Call-     11/30/2023    7:42 AM  Call back number  Post procedure Call Back phone  # [phone]  Permission to leave phone message Yes     Patient questions:  Do you have a fever, pain , or abdominal swelling? No. Pain Score  0 *  Have you tolerated food without any problems? Yes.    Have you been able to return to your normal activities? Yes.    Do you have any questions about your discharge instructions: Diet   No. Medications  No. Follow up visit  No.  Do you have questions or concerns about your Care? No.  Actions: * If pain score is 4 or above: No action needed, pain <4.

## 2023-12-02 LAB — SURGICAL PATHOLOGY

## 2023-12-03 ENCOUNTER — Other Ambulatory Visit: Payer: Self-pay | Admitting: Family Medicine

## 2023-12-17 NOTE — Telephone Encounter (Signed)
error 

## 2023-12-20 ENCOUNTER — Encounter: Payer: Self-pay | Admitting: Family Medicine

## 2023-12-20 DIAGNOSIS — F419 Anxiety disorder, unspecified: Secondary | ICD-10-CM

## 2023-12-20 MED ORDER — BUPROPION HCL ER (XL) 300 MG PO TB24: 300.0000 mg | ORAL_TABLET | Freq: Every day | ORAL | 1 refills | Status: DC

## 2024-01-01 ENCOUNTER — Other Ambulatory Visit: Payer: Self-pay

## 2024-01-01 ENCOUNTER — Emergency Department (HOSPITAL_COMMUNITY)
Admission: EM | Admit: 2024-01-01 | Discharge: 2024-01-01 | Disposition: A | Discharge status: Home / Self Care | Payer: Medicare HMO | Attending: Emergency Medicine | Admitting: Emergency Medicine

## 2024-01-01 ENCOUNTER — Encounter (HOSPITAL_COMMUNITY): Payer: Self-pay | Admitting: *Deleted

## 2024-01-01 DIAGNOSIS — H43391 Other vitreous opacities, right eye: Secondary | ICD-10-CM | POA: Diagnosis not present

## 2024-01-01 DIAGNOSIS — R519 Headache, unspecified: Secondary | ICD-10-CM | POA: Diagnosis not present

## 2024-01-01 MED ORDER — FLUORESCEIN SODIUM 1 MG OP STRP
1.0000 | ORAL_STRIP | Freq: Once | OPHTHALMIC | Status: AC
  Administered 2024-01-01: 1 via OPHTHALMIC
  Filled 2024-01-01: qty 1

## 2024-01-01 MED ORDER — TETRACAINE HCL 0.5 % OP SOLN
2.0000 [drp] | Freq: Once | OPHTHALMIC | Status: AC
  Administered 2024-01-01: 2 [drp] via OPHTHALMIC
  Filled 2024-01-01: qty 4

## 2024-01-01 NOTE — ED Provider Notes (Signed)
 [place] EMERGENCY DEPARTMENT AT [facility] Provider Note   CSN: [id number] Arrival date & time: 01/01/24  1146     History  Chief Complaint  Patient presents with   Eye Problem    Dana Bryan is a 73 y.o. female.  73 yo F with a chief complaints of right eye symptoms.  The patient said that this morning she woke up and felt like there was a spider in her vision.  Since then it has broken up and moved a bit.  She had been having some flashes in the right eye over the past couple days now that she had these new symptoms.  She has a very mild headache.  She denies trauma.  Denies fevers.  She tried to call her eye doctor and then came to the ED for evaluation.   Eye Problem      Home Medications Prior to Admission medications   Medication Sig Start Date End Date Taking? Authorizing Provider  ALPRAZolam  (XANAX ) 0.25 MG tablet Take 1 tablet (0.25 mg total) by mouth daily as needed. Patient taking differently: Take 0.25 mg by mouth daily as needed for anxiety. 06/11/22   Tabori, Katherine E, MD  Ascorbic Acid (VITAMIN C PO) Take 500 mg by mouth daily.    [provider]  atorvastatin  (LIPITOR) 20 MG tablet TAKE 1 TABLET BY MOUTH EVERY DAY 11/05/23   Tabori, Katherine E, MD  benzonatate  (TESSALON ) 100 MG capsule Take 1 capsule (100 mg total) by mouth 3 (three) times daily as needed for cough. Do not take with alcohol or while driving or operating heavy machinery.  May cause drowsiness. 08/05/23   Chandra Harlene LABOR, NP  buPROPion  (WELLBUTRIN  XL) 300 MG 24 hr tablet Take 1 tablet (300 mg total) by mouth daily. 12/20/23   Tabori, Katherine E, MD  busPIRone  (BUSPAR ) 7.5 MG tablet TAKE 1 TABLET BY MOUTH TWICE A DAY 12/03/23   Tabori, Katherine E, MD  clobetasol cream (TEMOVATE) 0.05 % Apply 1 application  topically 2 (two) times daily as needed (irritation).    [provider]  ibuprofen  (ADVIL ,MOTRIN ) 200 MG tablet Take 400 mg by mouth daily as  needed for headache.    [provider]  loratadine  (CLARITIN ) 10 MG tablet Take 10 mg by mouth daily.    [provider]  Multiple Vitamins-Minerals (CENTRUM SILVER WOMEN 50+ PO) Take 1 tablet by mouth.    [provider]  vitamin D3 (CHOLECALCIFEROL) 25 MCG tablet Take by mouth daily.    [provider]      Allergies    Chlorhexidine gluconate    Review of Systems   Review of Systems  Physical Exam Updated Vital Signs BP (!) 148/89   Pulse 72   Temp 97.8 F (36.6 C)   Resp 18   Ht 5' 3 (1.6 m)   Wt 59.4 kg   SpO2 98%   BMI 23.21 kg/m  Physical Exam Vitals and nursing note reviewed.  Constitutional:      General: She is not in acute distress.    Appearance: She is well-developed. She is not diaphoretic.  HENT:     Head: Normocephalic and atraumatic.  Eyes:     General: Lids are normal.     Intraocular pressure: Right eye pressure is 17 mmHg.     Extraocular Movements: Extraocular movements intact.     Conjunctiva/sclera:     Right eye: Right conjunctiva is not injected. No chemosis or exudate.  Pupils: Pupils are equal, round, and reactive to light.  Cardiovascular:     Rate and Rhythm: Normal rate and regular rhythm.     Heart sounds: No murmur heard.    No friction rub. No gallop.  Pulmonary:     Effort: Pulmonary effort is normal.     Breath sounds: No wheezing or rales.  Abdominal:     General: There is no distension.     Palpations: Abdomen is soft.     Tenderness: There is no abdominal tenderness.  Musculoskeletal:        General: No tenderness.     Cervical back: Normal range of motion and neck supple.  Skin:    General: Skin is warm and dry.  Neurological:     Mental Status: She is alert and oriented to person, place, and time.  Psychiatric:        Behavior: Behavior normal.     ED Results / Procedures / Treatments   Labs (all labs ordered are listed, but only abnormal results are displayed) Labs Reviewed  - No data to display  EKG None  Radiology No results found.  Procedures Ultrasound ED Ocular  Date/Time: 01/01/2024 2:22 PM  Performed by: Emil Share, DO Authorized by: Emil Share, DO   PROCEDURE DETAILS:    Indications: visual change     Assessed:  Right eye   Right eye axial view: obtained     Right eye sagittal view: obtained     Images: archived     Limitations:  None RIGHT EYE FINDINGS:     no foreign body noted in right eye   Optic nerve sheath diameter (mm):  3 Comments:     Small amount of possible pvd on us      Medications Ordered in ED Medications  tetracaine  (PONTOCAINE) 0.5 % ophthalmic solution 2 drop (2 drops Right Eye Given 01/01/24 1255)  fluorescein  ophthalmic strip 1 strip (1 strip Right Eye Given 01/01/24 1255)    ED Course/ Medical Decision Making/ A&P                                 Medical Decision Making Risk Prescription drug management.   73 yo F with a chief complaints of flashes and floaters of the right eye.  Started couple days ago and then got much worse today.  There may be a very small area of irregularity to the back of the eye.  I am not sure if this is a small focus of posterior vitreous detachment.  Will discuss with ophthalmology.  Discussed with ophto Dr. Tobie, recommends follow up in the clinic at 9am.   3:01 PM:  I have discussed the diagnosis/risks/treatment options with the patient.  Evaluation and diagnostic testing in the emergency department does not suggest an emergent condition requiring admission or immediate intervention beyond what has been performed at this time.  They will follow up with PCP. We also discussed returning to the ED immediately if new or worsening sx occur. We discussed the sx which are most concerning (e.g., sudden worsening pain, fever, inability to tolerate by mouth) that necessitate immediate return. Medications administered to the patient during their visit and any new prescriptions provided to the  patient are listed below.  Medications given during this visit Medications  tetracaine  (PONTOCAINE) 0.5 % ophthalmic solution 2 drop (2 drops Right Eye Given 01/01/24 1255)  fluorescein  ophthalmic strip 1 strip (1 strip Right  Eye Given 01/01/24 1255)     The patient appears reasonably screen and/or stabilized for discharge and I doubt any other medical condition or other [facility] requiring further screening, evaluation, or treatment in the ED at this time prior to discharge.          Final Clinical Impression(s) / ED Diagnoses Final diagnoses:  Floaters in visual field, right    Rx / DC Orders ED Discharge Orders     None         Emil Share, DO 01/01/24 1501

## 2024-01-01 NOTE — Discharge Instructions (Signed)
 I spoke with the eye doctor and he was to see you in the office at 9 AM on Monday.  If you have any worsening of symptoms he did want you to try and call the office.

## 2024-01-01 NOTE — ED Triage Notes (Signed)
 Patient c/o floaters in her right eye onset 1 hours ago, denies pain  other than a slight headache , states she feels pressure in her eye. She has noticed more problems focusing to read last several days. Denies numbness or tingling no weakness.

## 2024-01-03 DIAGNOSIS — H4311 Vitreous hemorrhage, right eye: Secondary | ICD-10-CM | POA: Diagnosis not present

## 2024-01-03 DIAGNOSIS — H43811 Vitreous degeneration, right eye: Secondary | ICD-10-CM | POA: Diagnosis not present

## 2024-01-03 DIAGNOSIS — H43391 Other vitreous opacities, right eye: Secondary | ICD-10-CM | POA: Diagnosis not present

## 2024-01-03 DIAGNOSIS — H33101 Unspecified retinoschisis, right eye: Secondary | ICD-10-CM | POA: Diagnosis not present

## 2024-01-12 ENCOUNTER — Other Ambulatory Visit: Payer: Self-pay | Admitting: Family Medicine

## 2024-01-12 MED ORDER — ALPRAZOLAM 0.25 MG PO TABS: 0.2500 mg | ORAL_TABLET | Freq: Every day | ORAL | 3 refills | Status: AC | PRN

## 2024-01-18 DIAGNOSIS — H33101 Unspecified retinoschisis, right eye: Secondary | ICD-10-CM | POA: Diagnosis not present

## 2024-01-18 DIAGNOSIS — H4311 Vitreous hemorrhage, right eye: Secondary | ICD-10-CM | POA: Diagnosis not present

## 2024-01-18 DIAGNOSIS — H43391 Other vitreous opacities, right eye: Secondary | ICD-10-CM | POA: Diagnosis not present

## 2024-01-18 DIAGNOSIS — H43811 Vitreous degeneration, right eye: Secondary | ICD-10-CM | POA: Diagnosis not present

## 2024-01-24 DIAGNOSIS — N8110 Cystocele, unspecified: Secondary | ICD-10-CM | POA: Diagnosis not present

## 2024-01-24 DIAGNOSIS — Z01411 Encounter for gynecological examination (general) (routine) with abnormal findings: Secondary | ICD-10-CM | POA: Diagnosis not present

## 2024-01-24 DIAGNOSIS — N819 Female genital prolapse, unspecified: Secondary | ICD-10-CM | POA: Diagnosis not present

## 2024-03-03 ENCOUNTER — Other Ambulatory Visit: Payer: Self-pay | Admitting: Family Medicine

## 2024-03-23 ENCOUNTER — Encounter: Payer: Self-pay | Admitting: Family Medicine

## 2024-03-23 ENCOUNTER — Ambulatory Visit (INDEPENDENT_AMBULATORY_CARE_PROVIDER_SITE_OTHER): Payer: Medicare HMO | Admitting: Family Medicine

## 2024-03-23 VITALS — BP 124/74 | HR 69 | Temp 97.8°F | Ht 63.0 in | Wt 133.0 lb

## 2024-03-23 DIAGNOSIS — H33101 Unspecified retinoschisis, right eye: Secondary | ICD-10-CM | POA: Diagnosis not present

## 2024-03-23 DIAGNOSIS — H43811 Vitreous degeneration, right eye: Secondary | ICD-10-CM | POA: Diagnosis not present

## 2024-03-23 DIAGNOSIS — E785 Hyperlipidemia, unspecified: Secondary | ICD-10-CM

## 2024-03-23 DIAGNOSIS — H2513 Age-related nuclear cataract, bilateral: Secondary | ICD-10-CM | POA: Diagnosis not present

## 2024-03-23 DIAGNOSIS — H43391 Other vitreous opacities, right eye: Secondary | ICD-10-CM | POA: Diagnosis not present

## 2024-03-23 LAB — LIPID PANEL
Cholesterol: 178 mg/dL (ref 0–200)
HDL: 85.3 mg/dL (ref >39.00)
LDL Cholesterol: 78 mg/dL (ref 0–99)
NonHDL: 92.23
Total CHOL/HDL Ratio: 2
Triglycerides: 72 mg/dL (ref 0.0–149.0)
VLDL: 14.4 mg/dL (ref 0.0–40.0)

## 2024-03-23 LAB — HEPATIC FUNCTION PANEL
ALT: 18 U/L (ref 0–35)
AST: 15 U/L (ref 0–37)
Albumin: 4.3 g/dL (ref 3.5–5.2)
Alkaline Phosphatase: 97 U/L (ref 39–117)
Bilirubin, Direct: 0.1 mg/dL (ref 0.0–0.3)
Total Bilirubin: 0.4 mg/dL (ref 0.2–1.2)
Total Protein: 7 g/dL (ref 6.0–8.3)

## 2024-03-23 LAB — BASIC METABOLIC PANEL WITH GFR
BUN: 14 mg/dL (ref 6–23)
CO2: 31 meq/L (ref 19–32)
Calcium: 9.1 mg/dL (ref 8.4–10.5)
Chloride: 103 meq/L (ref 96–112)
Creatinine, Ser: 0.73 mg/dL (ref 0.40–1.20)
GFR: 82.87 mL/min (ref >60.00)
Glucose, Bld: 75 mg/dL (ref 70–99)
Potassium: 3.8 meq/L (ref 3.5–5.1)
Sodium: 139 meq/L (ref 135–145)

## 2024-03-23 NOTE — Assessment & Plan Note (Signed)
Chronic problem.  Tolerating lipitor w/o difficulty.  Check labs.  Adjust meds prn  

## 2024-03-23 NOTE — Patient Instructions (Signed)
Schedule your complete physical in 6 months ?We'll notify you of your lab results and make any changes if needed ?Continue to work on healthy diet and regular exercise- you look great! ?Call with any questions or concerns ?Stay Safe!  Stay Healthy! ?Happy Spring!!! ?

## 2024-03-23 NOTE — Progress Notes (Addendum)
   Subjective:    Patient ID: Dana Bryan, female    DOB: 1951-05-01, 73 y.o.   MRN: [id number]  HPI Hyperlipidemia- chronic problem, on Atorvastatin 20mg  daily.  No CP, SOB, abd pain, N/V.  No regular exercise recently.   Review of Systems For ROS see HPI     Objective:   Physical Exam Vitals reviewed.  Constitutional:      General: She is not in acute distress.    Appearance: Normal appearance. She is well-developed. She is not ill-appearing.  HENT:     Head: Normocephalic and atraumatic.  Eyes:     Conjunctiva/sclera: Conjunctivae normal.     Pupils: Pupils are equal, round, and reactive to light.  Neck:     Thyroid: No thyromegaly.  Cardiovascular:     Rate and Rhythm: Normal rate and regular rhythm.     Pulses: Normal pulses.     Heart sounds: Murmur (II/VI SEM) heard.  Pulmonary:     Effort: Pulmonary effort is normal. No respiratory distress.     Breath sounds: Normal breath sounds.  Abdominal:     General: There is no distension.     Palpations: Abdomen is soft.     Tenderness: There is no abdominal tenderness.  Musculoskeletal:     Cervical back: Normal range of motion and neck supple.     Right lower leg: No edema.     Left lower leg: No edema.  Lymphadenopathy:     Cervical: No cervical adenopathy.  Skin:    General: Skin is warm and dry.  Neurological:     General: No focal deficit present.     Mental Status: She is alert and oriented to person, place, and time.  Psychiatric:        Mood and Affect: Mood normal.        Behavior: Behavior normal.        Thought Content: Thought content normal.           Assessment & Plan:

## 2024-03-24 ENCOUNTER — Encounter: Payer: Self-pay | Admitting: Family Medicine

## 2024-03-24 NOTE — Telephone Encounter (Signed)
 Called to relay lab results, Left VM to return call or to check mychart.

## 2024-03-24 NOTE — Telephone Encounter (Signed)
-----   Message from Neena Rhymes sent at 03/24/2024  7:39 AM EDT ----- Labs look great!  No changes at this time

## 2024-04-11 DIAGNOSIS — L821 Other seborrheic keratosis: Secondary | ICD-10-CM | POA: Diagnosis not present

## 2024-04-11 DIAGNOSIS — L570 Actinic keratosis: Secondary | ICD-10-CM | POA: Diagnosis not present

## 2024-04-11 DIAGNOSIS — L565 Disseminated superficial actinic porokeratosis (DSAP): Secondary | ICD-10-CM | POA: Diagnosis not present

## 2024-04-11 DIAGNOSIS — L820 Inflamed seborrheic keratosis: Secondary | ICD-10-CM | POA: Diagnosis not present

## 2024-04-11 DIAGNOSIS — Z85828 Personal history of other malignant neoplasm of skin: Secondary | ICD-10-CM | POA: Diagnosis not present

## 2024-05-15 ENCOUNTER — Other Ambulatory Visit: Payer: Self-pay | Admitting: Family Medicine

## 2024-06-12 DIAGNOSIS — L814 Other melanin hyperpigmentation: Secondary | ICD-10-CM | POA: Diagnosis not present

## 2024-06-12 DIAGNOSIS — D1801 Hemangioma of skin and subcutaneous tissue: Secondary | ICD-10-CM | POA: Diagnosis not present

## 2024-06-12 DIAGNOSIS — L821 Other seborrheic keratosis: Secondary | ICD-10-CM | POA: Diagnosis not present

## 2024-06-12 DIAGNOSIS — D225 Melanocytic nevi of trunk: Secondary | ICD-10-CM | POA: Diagnosis not present

## 2024-06-12 DIAGNOSIS — L565 Disseminated superficial actinic porokeratosis (DSAP): Secondary | ICD-10-CM | POA: Diagnosis not present

## 2024-06-12 DIAGNOSIS — Z85828 Personal history of other malignant neoplasm of skin: Secondary | ICD-10-CM | POA: Diagnosis not present

## 2024-06-16 ENCOUNTER — Other Ambulatory Visit: Payer: Self-pay | Admitting: Family Medicine

## 2024-06-16 DIAGNOSIS — F32A Anxiety disorder, unspecified: Secondary | ICD-10-CM

## 2024-07-10 ENCOUNTER — Other Ambulatory Visit: Payer: Self-pay | Admitting: *Deleted

## 2024-07-10 DIAGNOSIS — I350 Nonrheumatic aortic (valve) stenosis: Secondary | ICD-10-CM

## 2024-07-10 DIAGNOSIS — Q2381 Bicuspid aortic valve: Secondary | ICD-10-CM

## 2024-07-10 NOTE — Progress Notes (Signed)
 New order for Echo--  reorder the current order will be out of date on 08/15/24 when echo is schedule.

## 2024-07-12 DIAGNOSIS — H2513 Age-related nuclear cataract, bilateral: Secondary | ICD-10-CM | POA: Diagnosis not present

## 2024-07-12 DIAGNOSIS — H43811 Vitreous degeneration, right eye: Secondary | ICD-10-CM | POA: Diagnosis not present

## 2024-07-27 ENCOUNTER — Ambulatory Visit (INDEPENDENT_AMBULATORY_CARE_PROVIDER_SITE_OTHER): Admitting: Family Medicine

## 2024-07-27 ENCOUNTER — Encounter: Payer: Self-pay | Admitting: Family Medicine

## 2024-07-27 VITALS — BP 138/80 | HR 65 | Temp 98.0°F | Ht 63.0 in | Wt 134.2 lb

## 2024-07-27 DIAGNOSIS — M25551 Pain in right hip: Secondary | ICD-10-CM

## 2024-07-27 MED ORDER — PREDNISONE 10 MG PO TABS
ORAL_TABLET | ORAL | 0 refills | Status: DC
Start: 2024-07-27 — End: 2024-09-26

## 2024-07-27 NOTE — Progress Notes (Signed)
   Subjective:    Patient ID: Dana Bryan, female    DOB: 1951/07/28, 73 y.o.   MRN: [id number]  HPI Hip pain- R sided.  Has to be particularly careful on steps.  Sxs started ~6 months ago and have been worsening.  Pain will wake her at night.  Has a burning pain that radiates down her leg.  Will have some groin pain, some pain in the buttock.  Pain is worse w/ weight bearing and movement.  Does get relief w/ ibuprofen .     Review of Systems For ROS see HPI     Objective:   Physical Exam Vitals reviewed.  Constitutional:      General: She is not in acute distress.    Appearance: Normal appearance. She is not ill-appearing.  HENT:     Head: Normocephalic and atraumatic.  Musculoskeletal:        General: Tenderness (TTP over R SI joint, R sciatic notch, and R trochanteric bursa) present. No swelling or deformity.  Skin:    General: Skin is warm and dry.  Neurological:     General: No focal deficit present.     Mental Status: She is alert and oriented to person, place, and time.     Gait: Gait normal.     Comments: (-) SLR x2  Psychiatric:        Mood and Affect: Mood normal.        Behavior: Behavior normal.        Thought Content: Thought content normal.           Assessment & Plan:  R hip pain- new to provider, ongoing for pt.  Sxs started ~6 months ago and have been worsening.  She is fearful the pain is going to cause her to fall on stairs.  + relief w/ Ibuprofen .  Will start prednisone  taper for relief of pain and inflammation and start PT.  Reviewed supportive care and red flags that should prompt return.  Pt expressed understanding and is in agreement w/ plan.

## 2024-07-27 NOTE — Patient Instructions (Signed)
 Follow up as needed or as scheduled START the Prednisone  as directed- 3 pills at the same time x3 days, then 2 pills at the same time x3 days, then 1 pill daily.  Take w/ food  Do not take any additional Advil  or Ibuprofen  while on the prednisone  but you can take Tylenol  We'll call you to schedule your physical therapy appt Call with any questions or concerns Stay Safe!  Stay Healthy! Hang in there!!!

## 2024-07-30 ENCOUNTER — Encounter: Payer: Self-pay | Admitting: Family Medicine

## 2024-08-09 ENCOUNTER — Other Ambulatory Visit: Payer: Self-pay | Admitting: Family Medicine

## 2024-08-12 ENCOUNTER — Other Ambulatory Visit: Payer: Self-pay | Admitting: Family Medicine

## 2024-08-15 ENCOUNTER — Ambulatory Visit (HOSPITAL_COMMUNITY)
Admission: RE | Admit: 2024-08-15 | Discharge: 2024-08-15 | Disposition: A | Discharge status: Home / Self Care | Source: Ambulatory Visit | Attending: Cardiovascular Disease | Admitting: Cardiovascular Disease

## 2024-08-15 DIAGNOSIS — Q2381 Bicuspid aortic valve: Secondary | ICD-10-CM | POA: Insufficient documentation

## 2024-08-15 DIAGNOSIS — I350 Nonrheumatic aortic (valve) stenosis: Secondary | ICD-10-CM | POA: Diagnosis not present

## 2024-08-15 LAB — ECHOCARDIOGRAM COMPLETE
AR max vel: 0.71 cm2
AV Area VTI: 0.68 cm2
AV Area mean vel: 0.8 cm2
AV Mean grad: 31 mmHg
AV Peak grad: 48.4 mmHg
Ao pk vel: 3.48 m/s
S' Lateral: 3.15 cm

## 2024-08-17 ENCOUNTER — Ambulatory Visit: Payer: Self-pay | Admitting: Cardiovascular Disease

## 2024-08-23 ENCOUNTER — Ambulatory Visit: Attending: Family Medicine

## 2024-08-23 ENCOUNTER — Other Ambulatory Visit: Payer: Self-pay

## 2024-08-23 DIAGNOSIS — R262 Difficulty in walking, not elsewhere classified: Secondary | ICD-10-CM | POA: Diagnosis not present

## 2024-08-23 DIAGNOSIS — M25551 Pain in right hip: Secondary | ICD-10-CM | POA: Insufficient documentation

## 2024-08-23 NOTE — Therapy (Signed)
 OUTPATIENT PHYSICAL THERAPY LOWER EXTREMITY EVALUATION   Patient Name: Dana Bryan MRN: [id number] DOB:08/25/1951, 73 y.o., female Today's Date: 08/23/2024  END OF SESSION:  PT End of Session - 08/23/24 1108     Visit Number 1    Date for PT Re-Evaluation 10/18/24    Progress Note Due on Visit 10    PT Start Time 1102    PT Stop Time 1145    PT Time Calculation (min) 43 min    Activity Tolerance Patient tolerated treatment well    Behavior During Therapy WFL for tasks assessed/performed          Past Medical History:  Diagnosis Date   Anxiety    on meds   Basal cell carcinoma    Bicuspid aortic valve    Cancer (HCC)    Cataract    no sx    Generalized headaches    Heart murmur    per Dr.Tabori   Hyperlipidemia    on meds   Pneumonia    Reactive depression (situational)    on meds   Squamous cell skin cancer, finger    Vasomotor rhinitis    Past Surgical History:  Procedure Laterality Date   BUBBLE STUDY  09/04/2022   Procedure: BUBBLE STUDY;  Surgeon: Barbaraann Darryle Ned, MD;  Location: [facility] ENDOSCOPY;  Service: Cardiovascular;;   COLONOSCOPY  2008   normal    COLONOSCOPY W/ POLYPECTOMY  2021   [facility] at [facility], TA and SSPs, 3 yr recall   COLONOSCOPY WITH PROPOFOL   11/30/2023   [facility]bruster   TEE WITHOUT CARDIOVERSION N/A 09/04/2022   Procedure: TRANSESOPHAGEAL ECHOCARDIOGRAM (TEE);  Surgeon: Barbaraann Darryle Ned, MD;  Location: [facility] ENDOSCOPY;  Service: Cardiovascular;  Laterality: N/A;   TONSILLECTOMY  1973   Patient Active Problem List   Diagnosis Date Noted   Electrolyte abnormality 11/10/2022   Aortic stenosis 07/23/2022   LBBB (left bundle branch block) 06/14/2022   Lichen sclerosus 01/07/2022   Osteopenia 09/09/2021   Heart murmur 01/23/2020   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  Did take the steroid dose pack prescribed by MD, no improvement.  Pain the most prevalent with climbing steps, and with attempting to sleep, not painful or stiff upon waking  PERTINENT HISTORY: No known trauma, referred to PT from PCP PAIN:  Are you having pain? Yes: NPRS scale: 1 to 8  Pain location: primarily R lat hip and ant thigh, knee, sometimes medial thigh into groin  Pain description: deep pain, ache Aggravating factors: lying down, turning over, stair climbing Relieving factors: lies prone   PRECAUTIONS: None  RED FLAGS: None   WEIGHT BEARING RESTRICTIONS: No  FALLS:  Has patient fallen in last 6 months? No  LIVING ENVIRONMENT: Lives with: lives with their spouse Lives in: House/apartment Stairs: 2 outdoor steps at her home, at daughter's home has 2 flights of steps  Has following equipment at home: None  OCCUPATION: retired  PLOF: Independent  PATIENT GOALS: be able to sleep without pain and be able to walk better  NEXT MD VISIT: 09/26/24  OBJECTIVE:  Note: Objective measures were completed at Evaluation unless otherwise noted.  DIAGNOSTIC FINDINGS: NONE   PATIENT SURVEYS:  Lower Extremity Functional Score: 50 / 80 = 62.5 %  COGNITION: Overall cognitive status: Within functional limits for tasks assessed     SENSATION: WFL  EDEMA:  None noted  MUSCLE LENGTH: Hamstrings: Right wfl deg; Left wfl deg [name]: Right wnl deg; Left -5 deg  POSTURE: rounded shoulders and decreased lumbar lordosis  PALPATION: Very tender R gluteals, piriformis, post hip jt    LOWER EXTREMITY ROM:  AA ROM Right eval Left eval  Hip flexion 100   Hip extension    Hip abduction 30 45  Hip adduction    Hip internal rotation -10 -10  Hip external rotation 15 25  Knee flexion    Knee extension    Ankle dorsiflexion    Ankle plantarflexion    Ankle inversion    Ankle eversion     (Blank rows = not tested)  LOWER EXTREMITY MMT:  MMT Right eval Left eval  Hip flexion 4+p!   Hip extension lock bridge  4 5  Hip abduction 4+ 4+  Hip adduction    Hip internal rotation    Hip external rotation 3+ p! wnl  Knee flexion    Knee extension 4p! R lat hip   Ankle dorsiflexion    Ankle plantarflexion    Ankle inversion    Ankle eversion     (Blank rows = not tested)  LOWER EXTREMITY SPECIAL TESTS:  Hip special tests: Belvie (FABER) test: negative, Thomas test: negative, and Hip scouring test: positive   FUNCTIONAL TESTS:  Single leg stance: R 20 sec , L greater than 30, pain R lat hip  GAIT: Distance walked: in clinic Assistive device utilized: None Level of assistance: Complete Independence Comments: no specific antalgia or deficits noted                                                                                                                                 TREATMENT DATE: 08/23/24:  Evaluation, education in anatomy of hip, spine, surrounding structures Lateral glides of R prox femur, combined with R hip ER, mob with movement, with improvements in pain free ROM R hip flex and ER Instructed in the ex as described below    PATIENT EDUCATION:  Education details: POC, goals Person educated: Patient Education method: Explanation, Demonstration, Tactile cues, Verbal cues, and Handouts Education comprehension: verbalized understanding  HOME EXERCISE PROGRAM: Access Code: [email or web address] URL: [email or web address] Date: 08/23/2024 Prepared by: Greig Credit  Exercises - Bridge on BOSU  - 1 x daily - 3 x weekly - 3 sets - 10 reps -  Hooklying Isometric Hip Abduction with Belt  - 1 x daily - 7 x weekly - 10 reps - 5 hold  ASSESSMENT:  CLINICAL IMPRESSION: Patient is a 73 y.o. female  who was evaluated today by physical therapy  for R hip pain. She presents with tenderness R lateral, posterior hip musculature, weakness R hip for ER , extension, and flexion, pain with scour test R hip, some loss of ROM R hip as compared to L particularly for flexion and ER.  Her lumbar ROM is wfl.  She responded positively to R hip jt/ capsular mobilization today, we also initiated isometric ex for her hip musculature. She should benefit from skilled PT to address her deficits and improve her Sx.  She returns to her PCP in 4 weeks, and if she continues with pain after 2-3 weeks of PT might contact her MD about possible x ray.    OBJECTIVE IMPAIRMENTS: decreased activity tolerance, decreased balance, decreased endurance, decreased knowledge of condition, difficulty walking, decreased ROM, decreased strength, impaired flexibility, postural dysfunction, and pain.   ACTIVITY LIMITATIONS: carrying, standing, sleeping, stairs, locomotion level, and caring for others  PARTICIPATION LIMITATIONS: meal prep, shopping, community activity, and yard work  PERSONAL FACTORS: Age, Behavior pattern, Time since onset of injury/illness/exacerbation, and 1-2 comorbidities: osteopenia,aortic stenosis  are also affecting patient's functional outcome.   REHAB POTENTIAL: Good  CLINICAL DECISION MAKING: Evolving/moderate complexity  EVALUATION COMPLEXITY: Moderate   GOALS: Goals reviewed with patient? Yes  SHORT TERM GOALS: Target date: 2 weeks, 09/06/24 I HEP Baseline:initiated today Goal status: INITIAL   LONG TERM GOALS: Target date:8 weeks,  10/18/24  Pt able to climb 8 steps or complete 8 forward step ups with R LE on 8 step , without pain R hip Baseline:  Goal status: INITIAL  2.  Single leg stance R Le greater than 30 sec, without pain R  hip Baseline: p! 20 sec Goal status: INITIAL  3.  Strength R hip all planes improve to 4+ to 5/5 Baseline: see above chart  Goal status: INITIAL  4.  ROM R hip improve to normal Faber's position, normal flexion, Er  Baseline:  Goal status: INITIAL    PLAN:  PT FREQUENCY: 2x/week  PT DURATION: 8 weeks  PLANNED INTERVENTIONS: 97110-Therapeutic exercises, 97530- Therapeutic activity, 97112- Neuromuscular re-education, 97535- Self Care, [id number]- Manual therapy, 97033- Ionotophoresis 4mg /ml Dexamethasone, and Joint mobilization  PLAN FOR NEXT SESSION: recommend manual techniques, deep tissue massage R post/ lat hip musculature, R hip mobilization, progress / reassess therex for R hip   Geramy Lamorte L Ethan Kasperski, PT, DPT, OCS 08/23/2024, 2:15 PM

## 2024-08-28 ENCOUNTER — Ambulatory Visit

## 2024-08-28 ENCOUNTER — Other Ambulatory Visit: Payer: Self-pay

## 2024-08-28 DIAGNOSIS — R262 Difficulty in walking, not elsewhere classified: Secondary | ICD-10-CM

## 2024-08-28 DIAGNOSIS — M25551 Pain in right hip: Secondary | ICD-10-CM | POA: Diagnosis not present

## 2024-08-28 NOTE — Therapy (Signed)
 OUTPATIENT PHYSICAL THERAPY LOWER EXTREMITY TREATMENT   Patient Name: Dana Bryan MRN: [id number] DOB:07/12/1951, 73 y.o., female Today's Date: 08/28/2024  END OF SESSION:  PT End of Session - 08/28/24 1433     Visit Number 2    Date for PT Re-Evaluation 10/18/24    Progress Note Due on Visit 10    PT Start Time 1430    PT Stop Time 1515    PT Time Calculation (min) 45 min    Activity Tolerance Patient tolerated treatment well    Behavior During Therapy WFL for tasks assessed/performed           Past Medical History:  Diagnosis Date   Anxiety    on meds   Basal cell carcinoma    Bicuspid aortic valve    Cancer (HCC)    Cataract    no sx    Generalized headaches    Heart murmur    per Dr.Tabori   Hyperlipidemia    on meds   Pneumonia    Reactive depression (situational)    on meds   Squamous cell skin cancer, finger    Vasomotor rhinitis    Past Surgical History:  Procedure Laterality Date   BUBBLE STUDY  09/04/2022   Procedure: BUBBLE STUDY;  Surgeon: Barbaraann Darryle Ned, MD;  Location: [facility] ENDOSCOPY;  Service: Cardiovascular;;   COLONOSCOPY  2008   normal    COLONOSCOPY W/ POLYPECTOMY  2021   [facility], TA and SSPs, 3 yr recall   COLONOSCOPY WITH PROPOFOL   11/30/2023   [facility]   TEE WITHOUT CARDIOVERSION N/A 09/04/2022   Procedure: TRANSESOPHAGEAL ECHOCARDIOGRAM (TEE);  Surgeon: Barbaraann Darryle Ned, MD;  Location: [facility] ENDOSCOPY;  Service: Cardiovascular;  Laterality: N/A;   TONSILLECTOMY  1973   Patient Active Problem List   Diagnosis Date Noted   Electrolyte abnormality 11/10/2022   Aortic stenosis 07/23/2022   LBBB (left bundle branch block) 06/14/2022   Lichen sclerosus 01/07/2022   Osteopenia 09/09/2021   Heart murmur 01/23/2020   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   Routine health maintenance 08/06/2011   B12 deficiency 07/18/2008   Hyperlipidemia 02/15/2008   Anxiety and depression 02/15/2008    PCP: Mahlon Crank, MD  REFERRING PROVIDER: same  REFERRING DIAG: R hip pain  THERAPY DIAG:  Pain in right hip  Difficulty in walking, not elsewhere classified  Rationale for Evaluation and Treatment: Rehabilitation  ONSET DATE:  6 months , March 2025  SUBJECTIVE:   SUBJECTIVE STATEMENT:No specific improvement following initial visit and with the exercises, no worse either Eval:Reports 6 month h/o R hip pain.  Did take the steroid dose pack prescribed by MD, no improvement.  Pain the most prevalent with climbing steps, and with attempting to sleep, not painful or stiff upon waking  PERTINENT HISTORY: No known trauma, referred to PT from PCP PAIN:  Are you having pain? Yes: NPRS scale: 1 to 8  Pain location: primarily R lat hip and ant thigh, knee, sometimes medial thigh into groin  Pain description: deep pain, ache Aggravating factors: lying down, turning over, stair climbing Relieving factors: lies prone   PRECAUTIONS: None  RED FLAGS: None   WEIGHT BEARING RESTRICTIONS: No  FALLS:  Has patient fallen in last 6 months? No  LIVING ENVIRONMENT: Lives with: lives with their spouse Lives in: House/apartment Stairs: 2 outdoor steps at her home, at daughter's home has 2 flights of steps  Has following equipment at home: None  OCCUPATION: retired  PLOF: Independent  PATIENT GOALS: be able to sleep without pain and be able to walk better  NEXT MD VISIT: 09/26/24  OBJECTIVE:  Note: Objective measures were completed at Evaluation unless otherwise noted.  DIAGNOSTIC FINDINGS: NONE   PATIENT SURVEYS:  Lower Extremity Functional Score: 50 / 80 = 62.5 %  COGNITION: Overall cognitive status: Within functional limits for tasks assessed     SENSATION: WFL  EDEMA:  None noted  MUSCLE LENGTH: Hamstrings: Right wfl deg; Left wfl deg [name]: Right wnl deg; Left -5 deg  POSTURE: rounded shoulders and decreased lumbar lordosis  PALPATION: Very tender R gluteals,  piriformis, post hip jt   LOWER EXTREMITY ROM:  AA ROM Right eval Left eval  Hip flexion 100   Hip extension    Hip abduction 30 45  Hip adduction    Hip internal rotation -10 -10  Hip external rotation 15 25  Knee flexion    Knee extension    Ankle dorsiflexion    Ankle plantarflexion    Ankle inversion    Ankle eversion     (Blank rows = not tested)  LOWER EXTREMITY MMT:  MMT Right eval Left eval  Hip flexion 4+p!   Hip extension lock bridge  4 5  Hip abduction 4+ 4+  Hip adduction    Hip internal rotation    Hip external rotation 3+ p! wnl  Knee flexion    Knee extension 4p! R lat hip   Ankle dorsiflexion    Ankle plantarflexion    Ankle inversion    Ankle eversion     (Blank rows = not tested)  LOWER EXTREMITY SPECIAL TESTS:  Hip special tests: Belvie (FABER) test: negative, Thomas test: negative, and Hip scouring test: positive   FUNCTIONAL TESTS:  Single leg stance: R 20 sec , L greater than 30, pain R lat hip  GAIT: Distance walked: in clinic Assistive device utilized: None Level of assistance: Complete Independence Comments: no specific antalgia or deficits noted                                                                                                                                 TREATMENT DATE:  08/28/24:  Nustep Level 5 x 6 min Supine for mobilization with movement , with R hip ER. 3 bouts of 10 reps each with inf/lat glides R prox femur Prone for deep soft tissue massage R gluteus medius, piriformis Supine for R hip distraction with end range stretch with hip in open pack position  Reviewed home program of bridging and isometric hip abd/ER Added cat cows to home routine  08/23/24:  Evaluation, education in anatomy of hip, spine, surrounding structures Lateral glides of R prox femur, combined with R hip ER, mob with movement, with improvements in pain free ROM R hip flex and ER Instructed in the ex as described below    PATIENT  EDUCATION:  Education details: POC, goals Person educated: Patient  Education method: Explanation, Demonstration, Tactile cues, Verbal cues, and Handouts Education comprehension: verbalized understanding  HOME EXERCISE PROGRAM: Access Code: [email or web address] URL: [email or web address] Date: 08/23/2024 Prepared by: Greig Credit  Exercises - Bridge on BOSU  - 1 x daily - 3 x weekly - 3 sets - 10 reps - Hooklying Isometric Hip Abduction with Belt  - 1 x daily - 7 x weekly - 10 reps - 5 hold  ASSESSMENT:  CLINICAL IMPRESSION: The patient returned today for initial treatment following PT eval, she still had both groin and lateral hip pain with isolated R hip ER, active without any resistance.  R hip flexion Rom was improved compared to initial eval , still painful R lat hip with fabers position and R groin with hip abd stretch.  Proceeded today to address soft tissue tightness and jt capsular tightness R hip.  She will return in 2 days and we will reassess the same movements again.  She still may need a R hip x ray.    Eval: Patient is a 72 y.o. female  who was evaluated today by physical therapy  for R hip pain. She presents with tenderness R lateral, posterior hip musculature, weakness R hip for ER , extension, and flexion, pain with scour test R hip, some loss of ROM R hip as compared to L particularly for flexion and ER.  Her lumbar ROM is wfl.  She responded positively to R hip jt/ capsular mobilization today, we also initiated isometric ex for her hip musculature. She should benefit from skilled PT to address her deficits and improve her Sx.  She returns to her PCP in 4 weeks, and if she continues with pain after 2-3 weeks of PT might contact her MD about possible x ray.    OBJECTIVE IMPAIRMENTS: decreased activity tolerance, decreased balance, decreased endurance, decreased knowledge of condition, difficulty walking, decreased ROM, decreased strength, impaired flexibility, postural  dysfunction, and pain.   ACTIVITY LIMITATIONS: carrying, standing, sleeping, stairs, locomotion level, and caring for others  PARTICIPATION LIMITATIONS: meal prep, shopping, community activity, and yard work  PERSONAL FACTORS: Age, Behavior pattern, Time since onset of injury/illness/exacerbation, and 1-2 comorbidities: osteopenia,aortic stenosis  are also affecting patient's functional outcome.   REHAB POTENTIAL: Good  CLINICAL DECISION MAKING: Evolving/moderate complexity  EVALUATION COMPLEXITY: Moderate   GOALS: Goals reviewed with patient? Yes  SHORT TERM GOALS: Target date: 2 weeks, 09/06/24 I HEP Baseline:initiated today Goal status: INITIAL   LONG TERM GOALS: Target date:8 weeks,  10/18/24  Pt able to climb 8 steps or complete 8 forward step ups with R LE on 8 step , without pain R hip Baseline:  Goal status: INITIAL  2.  Single leg stance R Le greater than 30 sec, without pain R hip Baseline: p! 20 sec Goal status: INITIAL  3.  Strength R hip all planes improve to 4+ to 5/5 Baseline: see above chart  Goal status: INITIAL  4.  ROM R hip improve to normal Faber's position, normal flexion, Er  Baseline:  Goal status: INITIAL    PLAN:  PT FREQUENCY: 2x/week  PT DURATION: 8 weeks  PLANNED INTERVENTIONS: 97110-Therapeutic exercises, 97530- Therapeutic activity, 97112- Neuromuscular re-education, 97535- Self Care, [id number]- Manual therapy, 97033- Ionotophoresis 4mg /ml Dexamethasone, and Joint mobilization  PLAN FOR NEXT SESSION: recommend manual techniques, deep tissue massage R post/ lat hip musculature, R hip mobilization, progress / reassess therex for R hip   Saria Haran L Adelena Desantiago, PT, DPT, OCS 08/28/2024, 2:37 PM

## 2024-08-30 ENCOUNTER — Telehealth: Payer: Self-pay

## 2024-08-30 ENCOUNTER — Other Ambulatory Visit: Payer: Self-pay

## 2024-08-30 ENCOUNTER — Ambulatory Visit (INDEPENDENT_AMBULATORY_CARE_PROVIDER_SITE_OTHER)

## 2024-08-30 ENCOUNTER — Ambulatory Visit

## 2024-08-30 VITALS — Ht 63.0 in | Wt 134.0 lb

## 2024-08-30 DIAGNOSIS — Z780 Asymptomatic menopausal state: Secondary | ICD-10-CM

## 2024-08-30 DIAGNOSIS — M25551 Pain in right hip: Secondary | ICD-10-CM

## 2024-08-30 DIAGNOSIS — Z0000 Encounter for general adult medical examination without abnormal findings: Secondary | ICD-10-CM | POA: Diagnosis not present

## 2024-08-30 DIAGNOSIS — R262 Difficulty in walking, not elsewhere classified: Secondary | ICD-10-CM | POA: Diagnosis not present

## 2024-08-30 NOTE — Telephone Encounter (Signed)
-----   Message from Comer Greet sent at 08/30/2024 12:20 PM EDT ----- Oh no!  At this time, she definitely needs a full Ortho evaluation.  I am happy to place the referral but will first have my team reach out and see if she has a preference in which Ortho provider/office.  Thank you for the update! KT, MD ----- Message ----- From: Milas Greig CROME, PT Sent: 08/30/2024  11:34 AM EDT To: Comer FORBES Greet, MD  Hi Dr Greet Bouillon worked with Mrs Jason for 3 PT sessions now.  Unfortunately, e have not been able to make much progress with her R hip pain.  She has groin pain with specific movements such as active hip abduction and ER, and is quite tender along her gluteus medius posteriorly.  I am wondering if she needs imaging at this point.  We still have her on our schedule but will await to hear from you.    Thank you,   Amy Speaks, PT, DPT, OCS

## 2024-08-30 NOTE — Telephone Encounter (Signed)
 Referral placed.

## 2024-08-30 NOTE — Addendum Note (Signed)
 Addended by: Demorio Seeley E on: 08/30/2024 03:40 PM   Modules accepted: Orders

## 2024-08-30 NOTE — Patient Instructions (Signed)
 [name],  Thank you for taking the time for your Medicare Wellness Visit. I appreciate your continued commitment to your health goals. Please review the care plan we discussed, and feel free to reach out if I can assist you further.  Medicare recommends these wellness visits once per year to help you and your care team stay ahead of potential health issues. These visits are designed to focus on prevention, allowing your provider to concentrate on managing your acute and chronic conditions during your regular appointments.  Please note that Annual Wellness Visits do not include a physical exam. Some assessments may be limited, especially if the visit was conducted virtually. If needed, we may recommend a separate in-person follow-up with your provider.  Ongoing Care Seeing your primary care provider every 3 to 6 months helps us  monitor your health and provide consistent, personalized care. Next office visit on 09/26/2024.  You are due for a Flu vaccine and can get that done at the pharmacy or you get it done during your next office visit.    Referrals If a referral was made during today's visit and you haven't received any updates within two weeks, please contact the referred provider directly to check on the status. You have an order for: [x]   Bone Density     Please call for appointment:  [facility] [address] [place], [place] [address] [phone]  Make sure to wear two-piece clothing.  No lotions, powders, or deodorants the day of the appointment. Make sure to bring picture ID and insurance card.  Bring list of medications you are currently taking including any supplements.    Recommended Screenings:  Health Maintenance  Topic Date Due   Medicare Annual Wellness Visit  04/20/2024   Flu Shot  07/21/2024   Mammogram  10/28/2024   DTaP/Tdap/Td vaccine (2 - Td or Tdap) 01/06/2026   Colon Cancer Screening  11/29/2030   Pneumococcal Vaccine for age  over 19  Completed   DEXA scan (bone density measurement)  Completed   Hepatitis C Screening  Completed   Zoster (Shingles) Vaccine  Completed   HPV Vaccine  Aged Out   Meningitis B Vaccine  Aged Out   COVID-19 Vaccine  Discontinued       08/30/2024    9:40 AM  Advanced Directives  Does Patient Have a Medical Advance Directive? Yes  Type of Estate agent of [place];Living will  Copy of Healthcare Power of Attorney in Chart? No - copy requested   Advance Care Planning is important because it: Ensures you receive medical care that aligns with your values, goals, and preferences. Provides guidance to your family and loved ones, reducing the emotional burden of decision-making during critical moments.  Vision: Annual vision screenings are recommended for early detection of glaucoma, cataracts, and diabetic retinopathy. These exams can also reveal signs of chronic conditions such as diabetes and high blood pressure.  Dental: Annual dental screenings help detect early signs of oral cancer, gum disease, and other conditions linked to overall health, including heart disease and diabetes.  Please see the attached documents for additional preventive care recommendations.

## 2024-08-30 NOTE — Telephone Encounter (Signed)
 Called patient and she would like to go with a [facility] provider in the [place] area for ortho.   Attaching Amy to keep her in the loop.

## 2024-08-30 NOTE — Therapy (Signed)
 OUTPATIENT PHYSICAL THERAPY LOWER EXTREMITY TREATMENT   Patient Name: Dana Bryan MRN: [id number] DOB:[birth year], 73 y.o., female Today's Date: 08/30/2024  END OF SESSION:  PT End of Session - 08/30/24 1052     Visit Number 3    Date for PT Re-Evaluation 10/18/24    Progress Note Due on Visit 10    PT Start Time 1053    PT Stop Time 1140    PT Time Calculation (min) 47 min    Activity Tolerance Patient tolerated treatment well    Behavior During Therapy WFL for tasks assessed/performed            Past Medical History:  Diagnosis Date   Anxiety    on meds   Basal cell carcinoma    Bicuspid aortic valve    Cancer (HCC)    Cataract    no sx    Generalized headaches    Heart murmur    per Dr.Tabori   Hyperlipidemia    on meds   Pneumonia    Reactive depression (situational)    on meds   Squamous cell skin cancer, finger    Vasomotor rhinitis    Past Surgical History:  Procedure Laterality Date   BUBBLE STUDY  09/04/2022   Procedure: BUBBLE STUDY;  Surgeon: Barbaraann Darryle Ned, MD;  Location: [facility] ENDOSCOPY;  Service: Cardiovascular;;   COLONOSCOPY  2008   normal    COLONOSCOPY W/ POLYPECTOMY  2021   [facility] at [facility], TA and SSPs, 3 yr recall   COLONOSCOPY WITH PROPOFOL   11/30/2023   [facility]   TEE WITHOUT CARDIOVERSION N/A 09/04/2022   Procedure: TRANSESOPHAGEAL ECHOCARDIOGRAM (TEE);  Surgeon: Barbaraann Darryle Ned, MD;  Location: [facility] ENDOSCOPY;  Service: Cardiovascular;  Laterality: N/A;   TONSILLECTOMY  1973   Patient Active Problem List   Diagnosis Date Noted   Electrolyte abnormality 11/10/2022   Aortic stenosis 07/23/2022   LBBB (left bundle branch block) 06/14/2022   Lichen sclerosus 01/07/2022   Osteopenia 09/09/2021   Heart murmur 01/23/2020   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   Routine health maintenance 08/06/2011   B12 deficiency 07/18/2008   Hyperlipidemia 02/15/2008   Anxiety and depression 02/15/2008    PCP: Mahlon Crank, MD  REFERRING PROVIDER: same  REFERRING DIAG: R hip pain  THERAPY DIAG:  Difficulty in walking, not elsewhere classified  Pain in right hip  Rationale for Evaluation and Treatment: Rehabilitation  ONSET DATE:  6 months , March 2025  SUBJECTIVE:   SUBJECTIVE STATEMENT:   Eval:Reports 6 month h/o R hip pain.  Did take the steroid dose pack prescribed by MD, no improvement.  Pain the most prevalent with climbing steps, and with attempting to sleep, not painful or stiff upon waking  PERTINENT HISTORY: No known trauma, referred to PT from PCP PAIN:  Are you having pain? Yes: NPRS scale: 1 to 8  Pain location: primarily R lat hip and ant thigh, knee, sometimes medial thigh into groin  Pain description: deep pain, ache Aggravating factors: lying down, turning over, stair climbing Relieving factors: lies prone   PRECAUTIONS: None  RED FLAGS: None   WEIGHT BEARING RESTRICTIONS: No  FALLS:  Has patient fallen in last 6 months? No  LIVING ENVIRONMENT: Lives with: lives with their spouse Lives in: House/apartment Stairs: 2 outdoor steps at her home, at daughter's home has 2 flights of steps  Has following equipment at home: None  OCCUPATION: retired  PLOF: Independent  PATIENT GOALS: be able to sleep without pain and  be able to walk better  NEXT MD VISIT: 09/26/24  OBJECTIVE:  Note: Objective measures were completed at Evaluation unless otherwise noted.  DIAGNOSTIC FINDINGS: NONE   PATIENT SURVEYS:  Lower Extremity Functional Score: 50 / 80 = 62.5 %  COGNITION: Overall cognitive status: Within functional limits for tasks assessed     SENSATION: WFL  EDEMA:  None noted  MUSCLE LENGTH: Hamstrings: Right wfl deg; Left wfl deg [name]: Right wnl deg; Left -5 deg  POSTURE: rounded shoulders and decreased lumbar lordosis  PALPATION: Very tender R gluteals, piriformis, post hip jt   LOWER EXTREMITY ROM:  AA ROM Right eval Left eval  Hip  flexion 100   Hip extension    Hip abduction 30 45  Hip adduction    Hip internal rotation -10 -10  Hip external rotation 15 25  Knee flexion    Knee extension    Ankle dorsiflexion    Ankle plantarflexion    Ankle inversion    Ankle eversion     (Blank rows = not tested)  LOWER EXTREMITY MMT:  MMT Right eval Left eval  Hip flexion 4+p!   Hip extension lock bridge  4 5  Hip abduction 4+ 4+  Hip adduction    Hip internal rotation    Hip external rotation 3+ p! wnl  Knee flexion    Knee extension 4p! R lat hip   Ankle dorsiflexion    Ankle plantarflexion    Ankle inversion    Ankle eversion     (Blank rows = not tested)  LOWER EXTREMITY SPECIAL TESTS:  Hip special tests: Belvie (FABER) test: negative, Thomas test: negative, and Hip scouring test: positive   FUNCTIONAL TESTS:  Single leg stance: R 20 sec , L greater than 30, pain R lat hip  GAIT: Distance walked: in clinic Assistive device utilized: None Level of assistance: Complete Independence Comments: no specific antalgia or deficits noted                                                                                                                                 TREATMENT DATE:  08/30/24:  Nustep L 5 ,6 min In supine R hip ER provokes R lat hip pain and R groin pain In side lying L, R clam shell also provoked R groin pain  Manual: supine R hip distraction with thrust x 3, with minimal reduction in R groin pain with provoking movements Prone for deep soft tissue myofascial release, massage R piriformis with theragun Supine for contract relax for alt resisted hip and knee ext 5 sec holds Supine manually assisted B hip and knee flexion for LTR's  Leg press R LE 20#, 20 reps Adapted bridge to unilateral bridge per medbridge HEP below     08/28/24:  Nustep Level 5 x 6 min Supine for mobilization with movement , with R hip ER. 3 bouts of 10 reps each with inf/lat glides R  prox femur Prone for deep soft  tissue massage R gluteus medius, piriformis Supine for R hip distraction with end range stretch with hip in open pack position  Reviewed home program of bridging and isometric hip abd/ER Added cat cows to home routine  08/23/24:  Evaluation, education in anatomy of hip, spine, surrounding structures Lateral glides of R prox femur, combined with R hip ER, mob with movement, with improvements in pain free ROM R hip flex and ER Instructed in the ex as described below    PATIENT EDUCATION:  Education details: POC, goals Person educated: Patient Education method: Explanation, Demonstration, Tactile cues, Verbal cues, and Handouts Education comprehension: verbalized understanding  HOME EXERCISE PROGRAM: Access Code: [email or web address] URL: [email or web address] Date: 08/30/2024 Prepared by: Greig Keita Demarco  Exercises - Hooklying Isometric Hip Abduction with Belt  - 1 x daily - 7 x weekly - 10 reps - 5 hold - Single Leg Bridge  - 1 x daily - 7 x weekly - 3 sets - 10 reps Access Code: [email or web address] URL: [email or web address] Date: 08/23/2024 Prepared by: Greig Credit  Exercises - Bridge on BOSU  - 1 x daily - 3 x weekly - 3 sets - 10 reps - Hooklying Isometric Hip Abduction with Belt  - 1 x daily - 7 x weekly - 10 reps - 5 hold  ASSESSMENT:  CLINICAL IMPRESSION: The patient returned today for second treatment session following PT eval, she still had both groin and lateral hip pain with isolated R hip ER, active without any resistance.  R hip flexion Rom continues to be improved compared to initial eval , still painful R lat hip with fabers position and R groin with hip abd stretch.  Did sent inbasket message to her referring physician, to ask about imaging.  Unable to really effectively change her R hip provoking pain with manual techniques, stretching during session.   Will await work from MD regarding imaging.   Eval: Patient is a 73 y.o. female  who was evaluated today by  physical therapy  for R hip pain. She presents with tenderness R lateral, posterior hip musculature, weakness R hip for ER , extension, and flexion, pain with scour test R hip, some loss of ROM R hip as compared to L particularly for flexion and ER.  Her lumbar ROM is wfl.  She responded positively to R hip jt/ capsular mobilization today, we also initiated isometric ex for her hip musculature. She should benefit from skilled PT to address her deficits and improve her Sx.  She returns to her PCP in 4 weeks, and if she continues with pain after 2-3 weeks of PT might contact her MD about possible x ray.    OBJECTIVE IMPAIRMENTS: decreased activity tolerance, decreased balance, decreased endurance, decreased knowledge of condition, difficulty walking, decreased ROM, decreased strength, impaired flexibility, postural dysfunction, and pain.   ACTIVITY LIMITATIONS: carrying, standing, sleeping, stairs, locomotion level, and caring for others  PARTICIPATION LIMITATIONS: meal prep, shopping, community activity, and yard work  PERSONAL FACTORS: Age, Behavior pattern, Time since onset of injury/illness/exacerbation, and 1-2 comorbidities: osteopenia,aortic stenosis  are also affecting patient's functional outcome.   REHAB POTENTIAL: Good  CLINICAL DECISION MAKING: Evolving/moderate complexity  EVALUATION COMPLEXITY: Moderate   GOALS: Goals reviewed with patient? Yes  SHORT TERM GOALS: Target date: 2 weeks, 09/06/24 I HEP Baseline:initiated today Goal status: INITIAL   LONG TERM GOALS: Target date:8 weeks,  10/18/24  Pt able to climb 8 steps or complete 8 forward step ups with R LE  on 8 step , without pain R hip Baseline:  Goal status: INITIAL  2.  Single leg stance R Le greater than 30 sec, without pain R hip Baseline: p! 20 sec Goal status: INITIAL  3.  Strength R hip all planes improve to 4+ to 5/5 Baseline: see above chart  Goal status: INITIAL  4.  ROM R hip improve to normal  Faber's position, normal flexion, Er  Baseline:  Goal status: INITIAL    PLAN:  PT FREQUENCY: 2x/week  PT DURATION: 8 weeks  PLANNED INTERVENTIONS: 97110-Therapeutic exercises, 97530- Therapeutic activity, 97112- Neuromuscular re-education, 97535- Self Care, [id number]- Manual therapy, [phone]- Ionotophoresis 4mg /ml Dexamethasone, and Joint mobilization  PLAN FOR NEXT SESSION: recommend manual techniques, deep tissue massage R post/ lat hip musculature, R hip mobilization, progress / reassess therex for R hip   Sanya Kobrin LITTIE Credit, PT, DPT, OCS 08/30/2024, 12:04 PM [facility] at [facility] [address] [facility]. [place], [place], [address] Phone: [phone]   Fax:  [phone]  Patient Details  Name: Dana Bryan MRN: [id number] Date of Birth: February 10, 1951 Referring Provider:  Mahlon Comer BRAVO, MD  Encounter Date: 08/30/2024   Greig LITTIE Credit, PT 08/30/2024, 12:04 PM  [place] [facility] [address] [facility] [place], [place], [address] Phone: [phone]   Fax:  [phone]

## 2024-08-30 NOTE — Progress Notes (Signed)
 Subjective:   Dana Bryan is a 73 y.o. who presents for a Medicare Wellness preventive visit.  As a reminder, Annual Wellness Visits don't include a physical exam, and some assessments may be limited, especially if this visit is performed virtually. We may recommend an in-person follow-up visit with your provider if needed.  Visit Complete: Virtual I connected with  Dana Bryan on 08/30/24 by a audio enabled telemedicine application and verified that I am speaking with the correct person using two identifiers.  Patient Location: Home  Provider Location: Home Office  I discussed the limitations of evaluation and management by telemedicine. The patient expressed understanding and agreed to proceed.  Vital Signs: Because this visit was a virtual/telehealth visit, some criteria may be missing or patient reported. Any vitals not documented were not able to be obtained and vitals that have been documented are patient reported.  VideoDeclined- This patient declined Librarian, academic. Therefore the visit was completed with audio only.  Persons Participating in Visit: Patient.  AWV Questionnaire: Yes: Patient Medicare AWV questionnaire was completed by the patient on 08/29/2024; I have confirmed that all information answered by patient is correct and no changes since this date.  Cardiac Risk Factors include: advanced age (>25men, >15 women);dyslipidemia  NO CHANGES IN ALCOHOL SCREENING FROM 07/23/2024-PER PT    Objective:    Today's Vitals   08/30/24 0934  Weight: 134 lb (60.8 kg)  Height: 5' 3 (1.6 m)   Body mass index is 23.74 kg/m.     08/30/2024    9:40 AM 01/01/2024   11:58 AM 04/21/2023    9:37 AM 03/28/2023   11:49 AM 11/11/2022    6:52 PM 11/09/2022   11:05 PM 09/04/2022    6:45 AM  Advanced Directives  Does Patient Have a Medical Advance Directive? Yes No Yes Yes  No Yes  Type of Estate agent of  [place];Living will  Healthcare Power of [employer] Power of Attorney   Living will;Healthcare Power of Attorney  Does patient want to make changes to medical advance directive?    Yes (ED - Information included in AVS)     Copy of Healthcare Power of Attorney in Chart? No - copy requested  No - copy requested    No - copy requested  Would patient like information on creating a medical advance directive?    No - Patient declined No - Patient declined      Current Medications (verified) Outpatient Encounter Medications as of 08/30/2024  Medication Sig   ALPRAZolam  (XANAX ) 0.25 MG tablet Take 1 tablet (0.25 mg total) by mouth daily as needed.   Ascorbic Acid (VITAMIN C PO) Take 500 mg by mouth daily.   atorvastatin  (LIPITOR) 20 MG tablet TAKE 1 TABLET BY MOUTH EVERY DAY   buPROPion  (WELLBUTRIN  XL) 300 MG 24 hr tablet TAKE 1 TABLET BY MOUTH EVERY DAY   busPIRone  (BUSPAR ) 7.5 MG tablet TAKE 1 TABLET BY MOUTH TWICE A DAY   clobetasol cream (TEMOVATE) 0.05 % Apply 1 application  topically 2 (two) times daily as needed (irritation).   ibuprofen  (ADVIL ,MOTRIN ) 200 MG tablet Take 400 mg by mouth daily as needed for headache.   loratadine  (CLARITIN ) 10 MG tablet Take 10 mg by mouth daily.   Multiple Vitamins-Minerals (CENTRUM SILVER WOMEN 50+ PO) Take 1 tablet by mouth.   predniSONE  (DELTASONE ) 10 MG tablet 3 tabs x3 days and then 2 tabs x3 days and then 1 tab x3 days.  Take w/ food.   vitamin D3 (CHOLECALCIFEROL) 25 MCG tablet Take by mouth daily.   No facility-administered encounter medications on file as of 08/30/2024.    Allergies (verified) Chlorhexidine gluconate   History: Past Medical History:  Diagnosis Date   Anxiety    on meds   Basal cell carcinoma    Bicuspid aortic valve    Cancer (HCC)    Cataract    no sx    Generalized headaches    Heart murmur    per Dr.Tabori   Hyperlipidemia    on meds   Pneumonia    Reactive depression (situational)    on meds    Squamous cell skin cancer, finger    Vasomotor rhinitis    Past Surgical History:  Procedure Laterality Date   BUBBLE STUDY  09/04/2022   Procedure: BUBBLE STUDY;  Surgeon: Barbaraann Darryle Ned, MD;  Location: [facility] ENDOSCOPY;  Service: Cardiovascular;;   COLONOSCOPY  2008   normal    COLONOSCOPY W/ POLYPECTOMY  2021   [facility], TA and SSPs, 3 yr recall   COLONOSCOPY WITH PROPOFOL   11/30/2023   armbruster   TEE WITHOUT CARDIOVERSION N/A 09/04/2022   Procedure: TRANSESOPHAGEAL ECHOCARDIOGRAM (TEE);  Surgeon: Barbaraann Darryle Ned, MD;  Location: [facility] ENDOSCOPY;  Service: Cardiovascular;  Laterality: N/A;   TONSILLECTOMY  1973   Family History  Problem Relation Age of Onset   Heart attack Mother    Hypertension Mother    Hypertension Father    Alcohol abuse Father    Colon polyps Neg Hx    Colon cancer Neg Hx    Esophageal cancer Neg Hx    Stomach cancer Neg Hx    Rectal cancer Neg Hx    Cancer - Colon Neg Hx    Social History   Socioeconomic History   Marital status: Married    Spouse name: Toribio   Number of children: 2   Years of education: Not on file   Highest education level: GED or equivalent  Occupational History   Occupation: Retired Photographer  Tobacco Use   Smoking status: Former    Current packs/day: 0.00    Types: Cigarettes    Start date: 01/21/1974    Quit date: 01/22/2004    Years since quitting: 20.6   Smokeless tobacco: Never  Vaping Use   Vaping status: Never Used  Substance and Sexual Activity   Alcohol use: Yes    Alcohol/week: 15.0 standard drinks of alcohol    Types: 15 Standard drinks or equivalent per week    Comment: drinks 1-2 drinks daily   Drug use: Never   Sexual activity: Not Currently    Birth control/protection: Post-menopausal  Other Topics Concern   Not on file  Social History Narrative   HSG, college Grad. Married '71. 2 Daughters- '77, '81. 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  '11 has returned to  work- out of the home ( [employer])      Lives with husband/2025   Social Drivers of Health   Financial Resource Strain: Low Risk  (08/30/2024)   Overall Financial Resource Strain (CARDIA)    Difficulty of Paying Living Expenses: Not hard at all  Food Insecurity: No Food Insecurity (08/30/2024)   Hunger Vital Sign    Worried About Running Out of Food in the Last Year: Never true    Ran Out of Food in the Last Year: Never true  Transportation Needs: No Transportation Needs (08/30/2024)   PRAPARE -  Administrator, Civil Service (Medical): No    Lack of Transportation (Non-Medical): No  Physical Activity: Inactive (08/30/2024)   Exercise Vital Sign    Days of Exercise per Week: 0 days    Minutes of Exercise per Session: 0 min  Stress: Stress Concern Present (08/30/2024)   [employer] of Occupational Health - Occupational Stress Questionnaire    Feeling of Stress: Very much  Social Connections: Moderately Isolated (08/30/2024)   Social Connection and Isolation Panel    Frequency of Communication with Friends and Family: More than three times a week    Frequency of Social Gatherings with Friends and Family: Once a week    Attends Religious Services: Never    Database administrator or Organizations: No    Attends Engineer, structural: Not on file    Marital Status: Married    Tobacco Counseling Counseling given: Not Answered    Clinical Intake:  Pre-visit preparation completed: Yes  Pain : No/denies pain     BMI - recorded: 23.74 Nutritional Status: BMI of 19-24  Normal Nutritional Risks: None Diabetes: No  Lab Results  Component Value Date   HGBA1C 5.3 03/22/2019   HGBA1C 5.7 12/09/2017     How often do you need to have someone help you when you read instructions, pamphlets, or other written materials from your doctor or pharmacy?: 1 - Never  Interpreter Needed?: No  Information entered by :: Adithi Gammon, RMA   Activities of Daily  Living     08/29/2024    4:29 PM  In your present state of health, do you have any difficulty performing the following activities:  Hearing? 0  Vision? 0  Difficulty concentrating or making decisions? 1  Walking or climbing stairs? 1  Dressing or bathing? 0  Doing errands, shopping? 0  Preparing Food and eating ? N  Using the Toilet? N  In the past six months, have you accidently leaked urine? Y  Do you have problems with loss of bowel control? N  Managing your Medications? N  Managing your Finances? N  Housekeeping or managing your Housekeeping? N    Patient Care Team: Mahlon Comer BRAVO, MD as PCP - General (Family Medicine) O'Neal, Darryle Ned, MD as PCP - Cardiology (Cardiology) Estelle Service, MD as Consulting Physician (Obstetrics and Gynecology) Leigh, Elspeth SQUIBB, MD as Consulting Physician (Gastroenterology) Pandora Cadet, [facility] as Pharmacist (Pharmacist)  I have updated your Care Teams any recent Medical Services you may have received from other providers in the past year.     Assessment:   This is a routine wellness examination for Bryson.  Hearing/Vision screen Hearing Screening - Comments:: Denies hearing difficulties   Vision Screening - Comments:: Wears eyeglasses/Dr. JAYSON Gaudy   Goals Addressed             This Visit's Progress    Patient Stated   On track    Increase activity       Depression Screen     08/30/2024    9:42 AM 07/14/2023    8:04 AM 04/21/2023    9:45 AM 03/23/2023    8:41 AM 02/03/2023    1:56 PM 11/30/2022   10:40 AM 11/06/2022   10:19 AM  PHQ 2/9 Scores  PHQ - 2 Score 1 3 0 0 0 0 1  PHQ- 9 Score 1 10 0 6 1 2 5     Fall Risk     08/29/2024    4:29 PM 07/27/2024  10:13 AM 03/23/2024    8:42 AM 07/14/2023    8:04 AM 04/21/2023    9:41 AM  Fall Risk   Falls in the past year? 0 0 0 0 0  Number falls in past yr:  0 0  0  Injury with Fall?  0 0  0  Risk for fall due to :   No Fall Risks No Fall Risks   Follow up Falls  evaluation completed;Falls prevention discussed  Falls evaluation completed Falls evaluation completed Education provided;Falls evaluation completed;Falls prevention discussed    MEDICARE RISK AT HOME:  Medicare Risk at Home Any stairs in or around the home?: (Patient-Rptd) Yes If so, are there any without handrails?: (Patient-Rptd) Yes Home free of loose throw rugs in walkways, pet beds, electrical cords, etc?: (Patient-Rptd) Yes Adequate lighting in your home to reduce risk of falls?: (Patient-Rptd) Yes Life alert?: (Patient-Rptd) No Use of a cane, walker or w/c?: (Patient-Rptd) No Grab bars in the bathroom?: (Patient-Rptd) Yes Shower chair or bench in shower?: (Patient-Rptd) No Elevated toilet seat or a handicapped toilet?: (Patient-Rptd) No  TIMED UP AND GO:  Was the test performed?  No  Cognitive Function: Declined/Normal: No cognitive concerns noted by patient or family. Patient alert, oriented, able to answer questions appropriately and recall recent events. No signs of memory loss or confusion.        04/21/2023    9:42 AM 07/23/2022   11:47 AM  6CIT Screen  What Year? 0 points 0 points  What month? 0 points 0 points  What time? 0 points 0 points  Count back from 20 0 points 0 points  Months in reverse 0 points 0 points  Repeat phrase 0 points 0 points  Total Score 0 points 0 points    Immunizations Immunization History  Administered Date(s) Administered   Fluad Quad(high Dose 65+) 09/10/2020   INFLUENZA, HIGH DOSE SEASONAL PF 09/18/2019, 10/02/2021, 09/21/2022, 09/08/2023   Influenza Whole 10/12/2008, 09/16/2009, 07/28/2012   Influenza,inj,Quad PF,6+ Mos 08/31/2014, 10/26/2017   Influenza-Unspecified 09/28/2013, 10/05/2015, 10/31/2018   PFIZER Comirnaty(Gray Top)Covid-19 Tri-Sucrose Vaccine 04/01/2021   PFIZER(Purple Top)SARS-COV-2 Vaccination 02/11/2020, 03/06/2020, 10/19/2020, 09/26/2022   Pfizer Covid-19 Vaccine Bivalent Booster 66yrs & up 10/02/2021, 05/01/2022    Pfizer Covid-19 Vaccine Bivalent Booster 5y-11y 04/30/2022   Pfizer(Comirnaty)Fall Seasonal Vaccine 12 years and older 09/08/2023   Pneumococcal Conjugate-13 01/06/2019   Pneumococcal Polysaccharide-23 02/07/2021   Respiratory Syncytial Virus Vaccine,Recomb Aduvanted(Arexvy) 09/26/2022, 09/29/2022   Tdap 01/07/2016   Zoster Recombinant(Shingrix) 07/21/2019, 10/21/2019   Zoster, Live 11/08/2014    Screening Tests Health Maintenance  Topic Date Due   Medicare Annual Wellness (AWV)  04/20/2024   Influenza Vaccine  07/21/2024   MAMMOGRAM  10/28/2024   DTaP/Tdap/Td (2 - Td or Tdap) 01/06/2026   Colonoscopy  11/29/2030   Pneumococcal Vaccine: 50+ Years  Completed   DEXA SCAN  Completed   Hepatitis C Screening  Completed   Zoster Vaccines- Shingrix  Completed   HPV VACCINES  Aged Out   Meningococcal B Vaccine  Aged Out   COVID-19 Vaccine  Discontinued    Health Maintenance Items Addressed: DEXA ordered, See Nurse Notes at the end of this note  Additional Screening:  Vision Screening: Recommended annual ophthalmology exams for early detection of glaucoma and other disorders of the eye. Is the patient up to date with their annual eye exam?  No  Who is the provider or what is the name of the office in which the patient attends annual eye exams?  Dr. KYM Gaudy  Dental Screening: Recommended annual dental exams for proper oral hygiene  Community Resource Referral / Chronic Care Management: CRR required this visit?  No   CCM required this visit?  No   Plan:    I have personally reviewed and noted the following in the patient's chart:   Medical and social history Use of alcohol, tobacco or illicit drugs  Current medications and supplements including opioid prescriptions. Patient is not currently taking opioid prescriptions. Functional ability and status Nutritional status Physical activity Advanced directives List of other physicians Hospitalizations, surgeries, and ER  visits in previous 12 months Vitals Screenings to include cognitive, depression, and falls Referrals and appointments  In addition, I have reviewed and discussed with patient certain preventive protocols, quality metrics, and best practice recommendations. A written personalized care plan for preventive services as well as general preventive health recommendations were provided to patient.   Bronte Sabado L Adolph Clutter, CMA   08/30/2024   After Visit Summary: (MyChart) Due to this being a telephonic visit, the after visit summary with patients personalized plan was offered to patient via MyChart   Notes: Patient is due for a DEXA and order has been placed today.  She is also due for a Flu vaccine.  Patient had no other concerns to address today.

## 2024-09-04 ENCOUNTER — Ambulatory Visit

## 2024-09-05 DIAGNOSIS — Z0100 Encounter for examination of eyes and vision without abnormal findings: Secondary | ICD-10-CM | POA: Diagnosis not present

## 2024-09-06 ENCOUNTER — Ambulatory Visit: Admitting: Physical Therapy

## 2024-09-11 ENCOUNTER — Ambulatory Visit: Admitting: Physical Therapy

## 2024-09-14 ENCOUNTER — Other Ambulatory Visit: Payer: Self-pay | Admitting: Family Medicine

## 2024-09-14 DIAGNOSIS — Z1231 Encounter for screening mammogram for malignant neoplasm of breast: Secondary | ICD-10-CM

## 2024-09-18 ENCOUNTER — Ambulatory Visit

## 2024-09-18 DIAGNOSIS — M545 Low back pain, unspecified: Secondary | ICD-10-CM | POA: Diagnosis not present

## 2024-09-18 DIAGNOSIS — G8929 Other chronic pain: Secondary | ICD-10-CM | POA: Diagnosis not present

## 2024-09-18 DIAGNOSIS — M25551 Pain in right hip: Secondary | ICD-10-CM | POA: Diagnosis not present

## 2024-09-18 DIAGNOSIS — M51369 Other intervertebral disc degeneration, lumbar region without mention of lumbar back pain or lower extremity pain: Secondary | ICD-10-CM | POA: Diagnosis not present

## 2024-09-26 ENCOUNTER — Encounter: Payer: Self-pay | Admitting: Family Medicine

## 2024-09-26 ENCOUNTER — Ambulatory Visit: Admitting: Family Medicine

## 2024-09-26 VITALS — BP 158/98 | HR 78 | Temp 97.8°F | Ht 63.0 in | Wt 134.1 lb

## 2024-09-26 DIAGNOSIS — M8580 Other specified disorders of bone density and structure, unspecified site: Secondary | ICD-10-CM

## 2024-09-26 DIAGNOSIS — Z0000 Encounter for general adult medical examination without abnormal findings: Secondary | ICD-10-CM | POA: Diagnosis not present

## 2024-09-26 DIAGNOSIS — E785 Hyperlipidemia, unspecified: Secondary | ICD-10-CM | POA: Diagnosis not present

## 2024-09-26 LAB — HEPATIC FUNCTION PANEL
ALT: 19 U/L (ref 0–35)
AST: 18 U/L (ref 0–37)
Albumin: 4.5 g/dL (ref 3.5–5.2)
Alkaline Phosphatase: 97 U/L (ref 39–117)
Bilirubin, Direct: 0.1 mg/dL (ref 0.0–0.3)
Total Bilirubin: 0.4 mg/dL (ref 0.2–1.2)
Total Protein: 7.4 g/dL (ref 6.0–8.3)

## 2024-09-26 LAB — BASIC METABOLIC PANEL WITH GFR
BUN: 13 mg/dL (ref 6–23)
CO2: 29 meq/L (ref 19–32)
Calcium: 9.4 mg/dL (ref 8.4–10.5)
Chloride: 101 meq/L (ref 96–112)
Creatinine, Ser: 0.67 mg/dL (ref 0.40–1.20)
GFR: 87.76 mL/min (ref >60.00)
Glucose, Bld: 86 mg/dL (ref 70–99)
Potassium: 4.2 meq/L (ref 3.5–5.1)
Sodium: 137 meq/L (ref 135–145)

## 2024-09-26 LAB — CBC WITH DIFFERENTIAL/PLATELET
Basophils Absolute: 0 K/uL (ref 0.0–0.1)
Basophils Relative: 0.5 % (ref 0.0–3.0)
Eosinophils Absolute: 0.1 K/uL (ref 0.0–0.7)
Eosinophils Relative: 1.8 % (ref 0.0–5.0)
HCT: 39.5 % (ref 36.0–46.0)
Hemoglobin: 13 g/dL (ref 12.0–15.0)
Lymphocytes Relative: 27.1 % (ref 12.0–46.0)
Lymphs Abs: 1.3 K/uL (ref 0.7–4.0)
MCHC: 32.9 g/dL (ref 30.0–36.0)
MCV: 86.8 fl (ref 78.0–100.0)
Monocytes Absolute: 0.5 K/uL (ref 0.1–1.0)
Monocytes Relative: 9.5 % (ref 3.0–12.0)
Neutro Abs: 2.9 K/uL (ref 1.4–7.7)
Neutrophils Relative %: 61.1 % (ref 43.0–77.0)
Platelets: 231 K/uL (ref 150.0–400.0)
RBC: 4.55 Mil/uL (ref 3.87–5.11)
RDW: 14.9 % (ref 11.5–15.5)
WBC: 4.8 K/uL (ref 4.0–10.5)

## 2024-09-26 LAB — LIPID PANEL
Cholesterol: 179 mg/dL (ref 0–200)
HDL: 98.2 mg/dL (ref >39.00)
LDL Cholesterol: 68 mg/dL (ref 0–99)
NonHDL: 80.54
Total CHOL/HDL Ratio: 2
Triglycerides: 61 mg/dL (ref 0.0–149.0)
VLDL: 12.2 mg/dL (ref 0.0–40.0)

## 2024-09-26 LAB — VITAMIN D 25 HYDROXY (VIT D DEFICIENCY, FRACTURES): VITD: 28.15 ng/mL — ABNORMAL LOW (ref 30.00–100.00)

## 2024-09-26 LAB — TSH: TSH: 5.55 u[IU]/mL — ABNORMAL HIGH (ref 0.35–5.50)

## 2024-09-26 NOTE — Assessment & Plan Note (Signed)
 Pt's PE WNL w/ exception of BP which is elevated today.  Pt reports coffee before arrival, poor sleep last night, and increased anxiety due to current political climate.  Will hold off on meds at this time but have her return in 3 months rather than 6 to recheck.  UTD on mammo, colonoscopy, Tdap, PNA.  Check labs.  Anticipatory guidance provided.

## 2024-09-26 NOTE — Progress Notes (Signed)
   Subjective:    Patient ID: Dana Bryan, female    DOB: 05-16-1951, 73 y.o.   MRN: [id number]  HPI CPE- UTD on mammo, colonoscopy, Tdap, PNA.  BP is elevated today- had coffee before arrival, did not sleep last night and is anxious this morning  Patient Care Team    Relationship Specialty Notifications Start End  Mahlon Comer BRAVO, MD PCP - General Family Medicine  01/01/24   O'Neal, Darryle Ned, MD PCP - Cardiology Cardiology  01/01/24   Estelle Service, MD Consulting Physician Obstetrics and Gynecology  01/06/19   Armbruster, Elspeth SQUIBB, MD Consulting Physician Gastroenterology  01/23/20   Pandora Cadet, [facility] Pharmacist Pharmacist  08/04/21    Comment: [phone]  Octavia Bruckner, MD Consulting Physician Ophthalmology  08/30/24     Health Maintenance  Topic Date Due   Influenza Vaccine  07/21/2024   Mammogram  10/28/2024   Medicare Annual Wellness (AWV)  08/30/2025   DTaP/Tdap/Td (2 - Td or Tdap) 01/06/2026   Colonoscopy  11/29/2030   Pneumococcal Vaccine: 50+ Years  Completed   DEXA SCAN  Completed   Hepatitis C Screening  Completed   Zoster Vaccines- Shingrix  Completed   Meningococcal B Vaccine  Aged Out   COVID-19 Vaccine  Discontinued      Review of Systems Patient reports no vision/ hearing changes, adenopathy,fever, weight change,  persistant/recurrent hoarseness , swallowing issues, chest pain, palpitations, edema, persistant/recurrent cough, hemoptysis, dyspnea (rest/exertional/paroxysmal nocturnal), gastrointestinal bleeding (melena, rectal bleeding), abdominal pain, significant heartburn, bowel changes, GU symptoms (dysuria, hematuria, incontinence), Gyn symptoms (abnormal  bleeding, pain),  syncope, focal weakness, memory loss, numbness & tingling, skin/hair/nail changes, abnormal bruising or bleeding, anxiety, or depression.     Objective:   Physical Exam General Appearance:    Alert, cooperative, no distress, appears stated age  Head:     Normocephalic, without obvious abnormality, atraumatic  Eyes:    PERRL, conjunctiva/corneas clear, EOM's intact both eyes  Ears:    Normal TM's and external ear canals, both ears  Nose:   Nares normal, septum midline, mucosa normal, no drainage    or sinus tenderness  Throat:   Lips, mucosa, and tongue normal; teeth and gums normal  Neck:   Supple, symmetrical, trachea midline, no adenopathy;    Thyroid : no enlargement/tenderness/nodules  Back:     Symmetric, no curvature, ROM normal, no CVA tenderness  Lungs:     Clear to auscultation bilaterally, respirations unlabored  Chest Wall:    No tenderness or deformity   Heart:    Regular rate and rhythm, S1 and S2 normal, no murmur, rub   or gallop  Breast Exam:    Deferred to GYN  Abdomen:     Soft, non-tender, bowel sounds active all four quadrants,    no masses, no organomegaly  Genitalia:    Deferred to GYN  Rectal:    Extremities:   Extremities normal, atraumatic, no cyanosis or edema  Pulses:   2+ and symmetric all extremities  Skin:   Skin color, texture, turgor normal, no rashes or lesions  Lymph nodes:   Cervical, supraclavicular, and axillary nodes normal  Neurologic:   CNII-XII intact, normal strength, sensation and reflexes    throughout          Assessment & Plan:

## 2024-09-26 NOTE — Patient Instructions (Addendum)
Follow up in 6 months to recheck BP and cholesterol We'll notify you of your lab results and make any changes if needed Keep up the good work on healthy diet and regular exercise- you look great!! Call with any questions or concerns Stay Safe!  Stay Healthy! Happy Fall!!!

## 2024-09-27 ENCOUNTER — Ambulatory Visit

## 2024-09-27 ENCOUNTER — Ambulatory Visit: Payer: Self-pay | Admitting: Family Medicine

## 2024-09-27 DIAGNOSIS — R7989 Other specified abnormal findings of blood chemistry: Secondary | ICD-10-CM

## 2024-09-27 LAB — T3, FREE: T3, Free: 3.5 pg/mL (ref 2.3–4.2)

## 2024-09-27 LAB — T4, FREE: Free T4: 0.78 ng/dL (ref 0.60–1.60)

## 2024-09-27 MED ORDER — VITAMIN D (ERGOCALCIFEROL) 1.25 MG (50000 UNIT) PO CAPS: 50000.0000 [IU] | ORAL_CAPSULE | ORAL | 0 refills | Status: AC

## 2024-09-27 NOTE — Telephone Encounter (Signed)
 Faxed add on lab request form, sent Rx strength Vit D to preferred pharmacy

## 2024-09-29 ENCOUNTER — Ambulatory Visit: Payer: Self-pay | Admitting: Family Medicine

## 2024-10-05 NOTE — Progress Notes (Signed)
 Cardiology Office Note:  .   Date:  10/06/2024  ID:  Dana Bryan, DOB 02/20/1951, MRN [id number] PCP: Mahlon Comer BRAVO, MD  [facility] Providers Cardiologist:  Darryle ONEIDA Decent, MD { History of Present Illness: .    Chief Complaint  Patient presents with   Follow-up    14 months. Still having the burning sensation in her chest.    Dana Bryan is a 73 y.o. female with history of AS who presents for follow-up.   History of Present Illness   Dana Bryan is a 73 year old female with bicuspid valve and moderate aortic stenosis who presents for follow-up.  She experiences a burning sensation in her chest during exertion, such as walking fast or climbing stairs. This symptom has become more frequent. No trouble breathing except when the burning occurs.  She has a history of minimal coronary artery disease with a coronary calcium  score of 27, in the 58th percentile, as per a coronary CTA done in 2023. Her most recent echocardiogram on August 15, 2024, shows moderate aortic stenosis with a mean gradient of 31 mmHg and a Vmax of 3.5 m/s. Her gradient has increased from 25 mmHg last year to 31 mmHg this year.  She mentions experiencing stress related to life and politics. She is not currently exercising but describes herself as very active, frequently traveling to [place] to visit her grandchildren. She drives approximately an hour and fifteen minutes to [place].   Her blood pressure was high during a recent visit and she is supposed to monitor it at home, although she has not started yet. She has a blood pressure cuff at home. Her cholesterol levels are at goal.  No allergies or other significant medical history updates. No swelling in her legs, although she notes occasional mild swelling that resolves.          Problem List LBBB -rate related  T chol 179, HDL 98, LDL 68, TG 61 Aortic stenosis/Bicuspid aortic valve  -moderate AS  08/2022 -Vmax 2.8 m/s, MG 21 mmHG, AVA 1.15 cm2 Non-obstructive CAD -minimal CAD CCTA 07/13/2022 -coronary calcium  27 (58th percentile)     ROS: All other ROS reviewed and negative. Pertinent positives noted in the HPI.     Studies Reviewed: SABRA   EKG Interpretation Date/Time:  Friday October 06 2024 09:29:36 EDT Ventricular Rate:  77 PR Interval:  172 QRS Duration:  146 QT Interval:  434 QTC Calculation: 491 R Axis:   0  Text Interpretation: Normal sinus rhythm Left bundle branch block Confirmed by Decent Darryle [phone]) on 10/06/2024 9:30:25 AM   TTE 08/15/2024  1. Left ventricular ejection fraction, by estimation, is 60 to 65%. Left  ventricular ejection fraction by PLAX is 61 %. The left ventricle has  normal function. The left ventricle has no regional wall motion  abnormalities. There is mild concentric left  ventricular hypertrophy. Left ventricular diastolic parameters are  consistent with Grade I diastolic dysfunction (impaired relaxation).   2. Right ventricular systolic function is normal. The right ventricular  size is normal. There is normal pulmonary artery systolic pressure.   3. The mitral valve is normal in structure. Trivial mitral valve  regurgitation. No evidence of mitral stenosis.   4. 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 There is moderate  calcification of the aortic valve. There is moderate thickening of the  aortic valve. Aortic valve regurgitation is mild. Moderate aortic valve  stenosis. Aortic valve area, by VTI measures  0.68 cm. Aortic valve mean gradient measures 31.0 mmHg. Aortic valve Vmax  measures 3.48 m/s.   5. The inferior vena cava is normal in size with greater than 50%  respiratory variability, suggesting right atrial pressure of 3 mmHg.  Physical Exam:   VS:  BP (!) 146/80 (BP Location: Right Arm, Patient Position: Sitting, Cuff Size: Normal)   Pulse 77   Ht 5' 3 (1.6 m)   Wt 134 lb (60.8 kg)   BMI 23.74 kg/m    Wt Readings  from Last 3 Encounters:  10/06/24 134 lb (60.8 kg)  09/26/24 134 lb 2 oz (60.8 kg)  08/30/24 134 lb (60.8 kg)    GEN: Well nourished, well developed in no acute distress NECK: No JVD; No carotid bruits CARDIAC: RRR, 3/6 SEM, rubs, gallops RESPIRATORY:  Clear to auscultation without rales, wheezing or rhonchi  ABDOMEN: Soft, non-tender, non-distended EXTREMITIES:  No edema; No deformity  ASSESSMENT AND PLAN: .   Assessment and Plan    Moderate aortic stenosis due to bicuspid aortic valve Moderate aortic stenosis confirmed by echocardiogram with mean gradient 31 mmHg and Vmax 3.5 m/s, showing progression. Not severe enough for intervention currently. Discussed potential future TAVR pending CT scan. - Follow-up in one year with echocardiogram before appointment. - Monitor for significant symptom changes such as increased dyspnea or decreased exercise tolerance.  Burning chest discomfort with exertion Burning chest discomfort with exertion, not attributed to moderate aortic stenosis. Coronary CTA showed minimal CAD. Increased physical activity may alleviate symptoms. - Encourage regular aerobic exercise. - Monitor symptoms and return for evaluation if she worsens.  Elevated blood pressure Blood pressure elevated, possibly due to stress and lack of exercise. Discussed lifestyle modifications to manage without medication. - Monitor blood pressure at home regularly. - Implement lifestyle changes including reduced salt intake and increased physical activity.   HLD - lipids at goal on lipitor.    Elevated BP - To follow with primary provider.              Follow-up: Return in about 1 year (around 10/06/2025).   Signed, Darryle DASEN. Barbaraann, MD, [facility]  [facility]  [address] [place], [place] [address] [phone]  9:52 AM

## 2024-10-06 ENCOUNTER — Ambulatory Visit: Attending: Cardiovascular Disease | Admitting: Cardiovascular Disease

## 2024-10-06 ENCOUNTER — Encounter: Payer: Self-pay | Admitting: Cardiovascular Disease

## 2024-10-06 VITALS — BP 146/80 | HR 77 | Ht 63.0 in | Wt 134.0 lb

## 2024-10-06 DIAGNOSIS — Q2381 Bicuspid aortic valve: Secondary | ICD-10-CM

## 2024-10-06 DIAGNOSIS — I350 Nonrheumatic aortic (valve) stenosis: Secondary | ICD-10-CM

## 2024-10-06 DIAGNOSIS — I447 Left bundle-branch block, unspecified: Secondary | ICD-10-CM | POA: Diagnosis not present

## 2024-10-06 NOTE — Patient Instructions (Signed)
 Medication Instructions:  Your physician recommends that you continue on your current medications as directed. Please refer to the Current Medication list given to you today.  *If you need a refill on your cardiac medications before your next appointment, please call your pharmacy*   Testing/Procedures: Your physician has requested that you have an echocardiogram. Echocardiography is a painless test that uses sound waves to create images of your heart. It provides your doctor with information about the size and shape of your heart and how well your heart's chambers and valves are working. This procedure takes approximately one hour. There are no restrictions for this procedure. Please do NOT wear cologne, perfume, aftershave, or lotions (deodorant is allowed). Please arrive 15 minutes prior to your appointment time.  Please note: We ask at that you not bring children with you during ultrasound (echo/ vascular) testing. Due to room size and safety concerns, children are not allowed in the ultrasound rooms during exams. Our front office staff cannot provide observation of children in our lobby area while testing is being conducted. An adult accompanying a patient to their appointment will only be allowed in the ultrasound room at the discretion of the ultrasound technician under special circumstances. We apologize for any inconvenience. **To do in October 2026**   Follow-Up: At [facility] you and your health needs are our priority.  As part of our continuing mission to provide you with exceptional heart care, our providers are all part of one team.  This team includes your primary Cardiologist (physician) and Advanced Practice Providers or APPs (Physician Assistants and Nurse Practitioners) who all work together to provide you with the care you need, when you need it.  Your next appointment:   12 month(s)  Provider:   Darryle ONEIDA Decent, MD

## 2024-10-09 ENCOUNTER — Encounter: Payer: Self-pay | Admitting: Family Medicine

## 2024-10-09 ENCOUNTER — Other Ambulatory Visit: Payer: Self-pay

## 2024-10-09 ENCOUNTER — Ambulatory Visit: Attending: Sports Medicine

## 2024-10-09 DIAGNOSIS — R262 Difficulty in walking, not elsewhere classified: Secondary | ICD-10-CM | POA: Insufficient documentation

## 2024-10-09 DIAGNOSIS — M8580 Other specified disorders of bone density and structure, unspecified site: Secondary | ICD-10-CM

## 2024-10-09 DIAGNOSIS — M25551 Pain in right hip: Secondary | ICD-10-CM | POA: Insufficient documentation

## 2024-10-09 NOTE — Therapy (Signed)
 OUTPATIENT PHYSICAL THERAPY LOWER EXTREMITY TREATMENT/REASSESSMENT Progress Note Reporting Period 08/23/24 to 10/09/24  See note below for Objective Data and Assessment of Progress/Goals.       Patient Name: Dana Bryan MRN: [id number] DOB:17-Jun-1951, 73 y.o., female Today's Date: 10/09/2024  END OF SESSION:  PT End of Session - 10/09/24 1623     Visit Number 4    Date for Recertification  12/04/24    Progress Note Due on Visit 10    PT Start Time 1345    PT Stop Time 1430    PT Time Calculation (min) 45 min    Activity Tolerance Patient tolerated treatment well    Behavior During Therapy [facility] for tasks assessed/performed             Past Medical History:  Diagnosis Date   Anxiety    on meds   Basal cell carcinoma    Bicuspid aortic valve    Cancer (HCC)    Cataract    no sx    Generalized headaches    Heart murmur    per Dr.Tabori   Hyperlipidemia    on meds   Pneumonia    Reactive depression (situational)    on meds   Squamous cell skin cancer, finger    Vasomotor rhinitis    Past Surgical History:  Procedure Laterality Date   BUBBLE STUDY  09/04/2022   Procedure: BUBBLE STUDY;  Surgeon: Barbaraann Darryle Ned, MD;  Location: [facility] ENDOSCOPY;  Service: Cardiovascular;;   COLONOSCOPY  2008   normal    COLONOSCOPY W/ POLYPECTOMY  2021   [facility], TA and SSPs, 3 yr recall   COLONOSCOPY WITH PROPOFOL   11/30/2023   [facility]   TEE WITHOUT CARDIOVERSION N/A 09/04/2022   Procedure: TRANSESOPHAGEAL ECHOCARDIOGRAM (TEE);  Surgeon: Barbaraann Darryle Ned, MD;  Location: [facility] ENDOSCOPY;  Service: Cardiovascular;  Laterality: N/A;   TONSILLECTOMY  1973   Patient Active Problem List   Diagnosis Date Noted   Electrolyte abnormality 11/10/2022   Aortic stenosis 07/23/2022   LBBB (left bundle branch block) 06/14/2022   Lichen sclerosus 01/07/2022   Osteopenia 09/09/2021   Heart murmur 01/23/2020   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   Routine  health maintenance 08/06/2011   B12 deficiency 07/18/2008   Hyperlipidemia 02/15/2008   Anxiety and depression 02/15/2008    PCP: Mahlon Crank, MD  REFERRING PROVIDER: same  REFERRING DIAG: R hip pain  THERAPY DIAG:  Pain in right hip  Difficulty in walking, not elsewhere classified  Rationale for Evaluation and Treatment: Rehabilitation  ONSET DATE:  6 months , March 2025  SUBJECTIVE:   SUBJECTIVE STATEMENT: 09/2024: Pain with climbing steps and with prolonged walking , not so much with sleep,  but pain is present all the time R hip  Eval:Reports 6 month h/o R hip pain.  Did take the steroid dose pack prescribed by MD, no improvement.  Pain the most prevalent with climbing steps, and with attempting to sleep, not painful or stiff upon waking  PERTINENT HISTORY: No known trauma, referred to PT from PCP PAIN:  Are you having pain? Yes: NPRS scale: 1 to 8  Pain location: primarily R lat hip and ant thigh, knee, sometimes medial thigh into groin  Pain description: deep pain, ache Aggravating factors: lying down, turning over, stair climbing Relieving factors: lies prone   PRECAUTIONS: None  RED FLAGS: None   WEIGHT BEARING RESTRICTIONS: No  FALLS:  Has patient fallen in last 6 months? No  LIVING ENVIRONMENT:  Lives with: lives with their spouse Lives in: House/apartment Stairs: 2 outdoor steps at her home, at daughter's home has 2 flights of steps  Has following equipment at home: None  OCCUPATION: retired  PLOF: Independent  PATIENT GOALS: be able to sleep without pain and be able to walk better  NEXT MD VISIT: 09/26/24  OBJECTIVE:  Note: Objective measures were completed at Evaluation unless otherwise noted.  DIAGNOSTIC FINDINGS: NONE   PATIENT SURVEYS:  Lower Extremity Functional Score: 50 / 80 = 62.5 % Lower Extremity Functional Score: 51 / 80 = 63.7 %  COGNITION: Overall cognitive status: Within functional limits for tasks  assessed     SENSATION: WFL  EDEMA:  None noted  MUSCLE LENGTH: Hamstrings: Right wfl deg; Left wfl deg [name]: Right wnl deg; Left -5 deg  POSTURE: rounded shoulders and decreased lumbar lordosis  PALPATION: Very tender R gluteals, piriformis, post hip jt   10/09/24: pain R obturator, R piriformis, R glut medius  LOWER EXTREMITY ROM:  AA ROM Right eval Left eval 10/09/24:  R  Hip flexion 100  wnl  Hip extension     Hip abduction 30 45 30 with R groin pain  Hip adduction     Hip internal rotation -10 -10   Hip external rotation 15 25 15  with R groin and R post/lat hip pain  Knee flexion     Knee extension     Ankle dorsiflexion     Ankle plantarflexion     Ankle inversion     Ankle eversion      (Blank rows = not tested)  LOWER EXTREMITY MMT:  MMT Right eval Left eval 10/09/24 R  Hip flexion 4+p!    Hip extension lock bridge  4 5   Hip abduction 4+ 4+ 5  Hip adduction     Hip internal rotation     Hip external rotation 3+ p! wnl 3+ /5 with pain R groin and lat hip  Knee flexion     Knee extension 4p! R lat hip    Ankle dorsiflexion     Ankle plantarflexion     Ankle inversion     Ankle eversion      (Blank rows = not tested)  LOWER EXTREMITY SPECIAL TESTS:  Hip special tests: Belvie (FABER) test: negative, Thomas test: negative, and Hip scouring test: positive   FUNCTIONAL TESTS:  10/09/24: forward step ups on 8 step 10 reps no pain  Improved control and baseline pain per pt with theraband wrap as make shift SI belt with the forward step ups Single leg stance: R 20 sec , L greater than 30, pain R lat hip  GAIT: Distance walked: in clinic Assistive device utilized: None Level of assistance: Complete Independence Comments: no specific antalgia or deficits noted   10/20: mild R laterla pelvic shift noted in R weight bearing.  TREATMENT DATE:  10/09/24: reassessment:  Instructed in: Seated R gluteal self massage/ with tennis ball  Cat /cow, LTR Kinesiotape R post/ lat hip 2 -I pieces extending from R lateral trochanter to lumbar region and second strip from lateral trochanter to sciatic notch   08/30/24:  Nustep L 5 ,6 min In supine R hip ER provokes R lat hip pain and R groin pain In side lying L, R clam shell also provoked R groin pain  Manual: supine R hip distraction with thrust x 3, with minimal reduction in R groin pain with provoking movements Prone for deep soft tissue myofascial release, massage R piriformis with theragun Supine for contract relax for alt resisted hip and knee ext 5 sec holds Supine manually assisted B hip and knee flexion for LTR's  Leg press R LE 20#, 20 reps Adapted bridge to unilateral bridge per medbridge HEP below     08/28/24:  Nustep Level 5 x 6 min Supine for mobilization with movement , with R hip ER. 3 bouts of 10 reps each with inf/lat glides R prox femur Prone for deep soft tissue massage R gluteus medius, piriformis Supine for R hip distraction with end range stretch with hip in open pack position  Reviewed home program of bridging and isometric hip abd/ER Added cat cows to home routine  08/23/24:  Evaluation, education in anatomy of hip, spine, surrounding structures Lateral glides of R prox femur, combined with R hip ER, mob with movement, with improvements in pain free ROM R hip flex and ER Instructed in the ex as described below    PATIENT EDUCATION:  Education details: POC, goals Person educated: Patient Education method: Explanation, Demonstration, Tactile cues, Verbal cues, and Handouts Education comprehension: verbalized understanding  HOME EXERCISE PROGRAM: Access Code: [email or web address] URL: [email or web address] Date: 08/30/2024 Prepared by: Greig Stella Bortle  Exercises - Hooklying Isometric Hip Abduction with Belt  - 1 x daily - 7 x  weekly - 10 reps - 5 hold - Single Leg Bridge  - 1 x daily - 7 x weekly - 3 sets - 10 reps Access Code: [email or web address] URL: [email or web address] Date: 08/23/2024 Prepared by: Greig Credit  Exercises - Bridge on BOSU  - 1 x daily - 3 x weekly - 3 sets - 10 reps - Hooklying Isometric Hip Abduction with Belt  - 1 x daily - 7 x weekly - 10 reps - 5 hold  ASSESSMENT:  CLINICAL IMPRESSION: The patient returned today for additional assessment of R hip / L4/5 radiculopathy pain  following consult with orthopedist.  R hip ER, abd, very painful radiating to groin and lateral/ post hip.  Also associated weakness R hip ER especially.  Changed our treatment approach, to address soft tissue adhesions in gluteals and R hip external rotators, also added stability with trial with kinesiotape.  Also address possible lumbar involvement with cat cow and lower trunk rotation.  May need progression with deep stabilization ex R hip.        Eval: Patient is a 73 y.o. female  who was evaluated today by physical therapy  for R hip pain. She presents with tenderness R lateral, posterior hip musculature, weakness R hip for ER , extension, and flexion, pain with scour test R hip, some loss of ROM R hip as compared to L particularly for flexion and ER.  Her lumbar ROM is wfl.  She responded positively to R hip jt/ capsular mobilization today, we also initiated isometric ex for her hip musculature. She should benefit  from skilled PT to address her deficits and improve her Sx.  She returns to her PCP in 4 weeks, and if she continues with pain after 2-3 weeks of PT might contact her MD about possible x ray.    OBJECTIVE IMPAIRMENTS: decreased activity tolerance, decreased balance, decreased endurance, decreased knowledge of condition, difficulty walking, decreased ROM, decreased strength, impaired flexibility, postural dysfunction, and pain.   ACTIVITY LIMITATIONS: carrying, standing, sleeping, stairs, locomotion level,  and caring for others  PARTICIPATION LIMITATIONS: meal prep, shopping, community activity, and yard work  PERSONAL FACTORS: Age, Behavior pattern, Time since onset of injury/illness/exacerbation, and 1-2 comorbidities: osteopenia,aortic stenosis  are also affecting patient's functional outcome.   REHAB POTENTIAL: Good  CLINICAL DECISION MAKING: Evolving/moderate complexity  EVALUATION COMPLEXITY: Moderate   GOALS: Goals reviewed with patient? Yes  SHORT TERM GOALS: Target date: 2 weeks, 09/06/24 I HEP Baseline:initiated today Goal status: INITIAL   LONG TERM GOALS: Target date:extended another 8 weeks,  12/04/24  Pt able to climb 8 steps or complete 8 forward step ups with R LE on 8 step , without pain R hip Baseline:  Goal status: INITIAL 10/09/24 some pain not met yet  2.  Single leg stance R Le greater than 30 sec, without pain R hip Baseline: p! 20 sec Goal status: INITIAL 10/09/24; not assessed today  3.  Strength R hip all planes improve to 4+ to 5/5 Baseline: see above chart  Goal status: progress, see chart  4.  ROM R hip improve to normal Faber's position, normal flexion, Er  Baseline:  Goal status: 10/09/24: provocative    PLAN:  PT FREQUENCY: 2x/week  PT DURATION: 8 weeks  PLANNED INTERVENTIONS: 97110-Therapeutic exercises, 97530- Therapeutic activity, 97112- Neuromuscular re-education, 97535- Self Care, 02859- Manual therapy, [phone]- Ionotophoresis 4mg /ml Dexamethasone, and Joint mobilization  PLAN FOR NEXT SESSION: recommend manual techniques, deep tissue massage R post/ lat hip musculature, R hip mobilization, progress / reassess therex for R hip, emphasis on stabilization, also addreess lumbar flexibility   Greig LITTIE Credit, PT, DPT, OCS 10/09/2024, 4:57 PM [place] [facility] at [facility] [address] [facility] [place], [place], [address] Phone: [phone]   Fax:  [phone]  Patient Details  Name: Dana Bryan MRN: [id number] Date of Birth: [birth year] Referring Provider:  Arnaldo Juliene RAMAN, MD  Encounter Date: 10/09/2024   Greig LITTIE Credit, PT 10/09/2024, 4:57 PM  [facility] [address] [facility] [place], [place], [address] Phone: [phone]   Fax:  [phone]

## 2024-10-13 ENCOUNTER — Ambulatory Visit: Admitting: Physical Therapy

## 2024-10-13 ENCOUNTER — Encounter: Payer: Self-pay | Admitting: Physical Therapy

## 2024-10-13 DIAGNOSIS — R262 Difficulty in walking, not elsewhere classified: Secondary | ICD-10-CM | POA: Diagnosis not present

## 2024-10-13 DIAGNOSIS — M25551 Pain in right hip: Secondary | ICD-10-CM | POA: Diagnosis not present

## 2024-10-13 NOTE — Therapy (Signed)
 OUTPATIENT PHYSICAL THERAPY LOWER EXTREMITY TREATMENT/REASSESSMENT Progress Note Reporting Period 08/23/24 to 10/09/24  See note below for Objective Data and Assessment of Progress/Goals.       Patient Name: Dana Bryan MRN: [id number] DOB:[birth year], 73 y.o., female Today's Date: 10/13/2024  END OF SESSION:  PT End of Session - 10/13/24 0842     Visit Number 5    Date for Recertification  12/04/24    PT Start Time 0842    PT Stop Time 0928    PT Time Calculation (min) 46 min    Activity Tolerance Patient tolerated treatment well    Behavior During Therapy [facility] for tasks assessed/performed             Past Medical History:  Diagnosis Date   Anxiety    on meds   Basal cell carcinoma    Bicuspid aortic valve    Cancer (HCC)    Cataract    no sx    Generalized headaches    Heart murmur    per Dr.Tabori   Hyperlipidemia    on meds   Pneumonia    Reactive depression (situational)    on meds   Squamous cell skin cancer, finger    Vasomotor rhinitis    Past Surgical History:  Procedure Laterality Date   BUBBLE STUDY  09/04/2022   Procedure: BUBBLE STUDY;  Surgeon: Barbaraann Darryle Ned, MD;  Location: [facility] ENDOSCOPY;  Service: Cardiovascular;;   COLONOSCOPY  2008   normal    COLONOSCOPY W/ POLYPECTOMY  2021   [facility] at [facility], TA and SSPs, 3 yr recall   COLONOSCOPY WITH PROPOFOL   11/30/2023   [facility]   TEE WITHOUT CARDIOVERSION N/A 09/04/2022   Procedure: TRANSESOPHAGEAL ECHOCARDIOGRAM (TEE);  Surgeon: Barbaraann Darryle Ned, MD;  Location: [facility] ENDOSCOPY;  Service: Cardiovascular;  Laterality: N/A;   TONSILLECTOMY  1973   Patient Active Problem List   Diagnosis Date Noted   Electrolyte abnormality 11/10/2022   Aortic stenosis 07/23/2022   LBBB (left bundle branch block) 06/14/2022   Lichen sclerosus 01/07/2022   Osteopenia 09/09/2021   Heart murmur 01/23/2020   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   Routine health maintenance 08/06/2011   B12  deficiency 07/18/2008   Hyperlipidemia 02/15/2008   Anxiety and depression 02/15/2008    PCP: Mahlon Crank, MD  REFERRING PROVIDER: same  REFERRING DIAG: R hip pain  THERAPY DIAG:  Pain in right hip  Difficulty in walking, not elsewhere classified  Rationale for Evaluation and Treatment: Rehabilitation  ONSET DATE:  6 months , March 2025  SUBJECTIVE:   SUBJECTIVE STATEMENT: 10/13/24  Doing okay, a little difficulty doing stairs and carrying things, pain a constant 2/10 on the right inner thigh  Eval:Reports 6 month h/o R hip pain.  Did take the steroid dose pack prescribed by MD, no improvement.  Pain the most prevalent with climbing steps, and with attempting to sleep, not painful or stiff upon waking  PERTINENT HISTORY: No known trauma, referred to PT from PCP PAIN:  Are you having pain? Yes: NPRS scale: 1 to 8  Pain location: primarily R lat hip and ant thigh, knee, sometimes medial thigh into groin  Pain description: deep pain, ache Aggravating factors: lying down, turning over, stair climbing Relieving factors: lies prone   PRECAUTIONS: None  RED FLAGS: None   WEIGHT BEARING RESTRICTIONS: No  FALLS:  Has patient fallen in last 6 months? No  LIVING ENVIRONMENT: Lives with: lives with their spouse Lives in: House/apartment Stairs: 2 outdoor steps  at her home, at daughter's home has 2 flights of steps  Has following equipment at home: None  OCCUPATION: retired  PLOF: Independent  PATIENT GOALS: be able to sleep without pain and be able to walk better  NEXT MD VISIT: 09/26/24  OBJECTIVE:  Note: Objective measures were completed at Evaluation unless otherwise noted.  DIAGNOSTIC FINDINGS: NONE   PATIENT SURVEYS:  Lower Extremity Functional Score: 50 / 80 = 62.5 % Lower Extremity Functional Score: 51 / 80 = 63.7 %  COGNITION: Overall cognitive status: Within functional limits for tasks assessed     SENSATION: WFL  EDEMA:  None  noted  MUSCLE LENGTH: Hamstrings: Right wfl deg; Left wfl deg [name]: Right wnl deg; Left -5 deg  POSTURE: rounded shoulders and decreased lumbar lordosis  PALPATION: Very tender R gluteals, piriformis, post hip jt   10/09/24: pain R obturator, R piriformis, R glut medius  LOWER EXTREMITY ROM:  AA ROM Right eval Left eval 10/09/24:  R  Hip flexion 100  wnl  Hip extension     Hip abduction 30 45 30 with R groin pain  Hip adduction     Hip internal rotation -10 -10   Hip external rotation 15 25 15  with R groin and R post/lat hip pain  Knee flexion     Knee extension     Ankle dorsiflexion     Ankle plantarflexion     Ankle inversion     Ankle eversion      (Blank rows = not tested)  LOWER EXTREMITY MMT:  MMT Right eval Left eval 10/09/24 R  Hip flexion 4+p!    Hip extension lock bridge  4 5   Hip abduction 4+ 4+ 5  Hip adduction     Hip internal rotation     Hip external rotation 3+ p! wnl 3+ /5 with pain R groin and lat hip  Knee flexion     Knee extension 4p! R lat hip    Ankle dorsiflexion     Ankle plantarflexion     Ankle inversion     Ankle eversion      (Blank rows = not tested)  LOWER EXTREMITY SPECIAL TESTS:  Hip special tests: Belvie (FABER) test: negative, Thomas test: negative, and Hip scouring test: positive   FUNCTIONAL TESTS:  10/09/24: forward step ups on 8 step 10 reps no pain  Improved control and baseline pain per pt with theraband wrap as make shift SI belt with the forward step ups Single leg stance: R 20 sec , L greater than 30, pain R lat hip  GAIT: Distance walked: in clinic Assistive device utilized: None Level of assistance: Complete Independence Comments: no specific antalgia or deficits noted   10/20: mild R laterla pelvic shift noted in R weight bearing.  TREATMENT DATE:  10/13/24 Nustep  level 5 x 5 minutes Leg press 20# 2x10 HS curls 20# 2x10 Feet on ball K2C, rotation, small bridge, isometric abs Red tband clamshells Ball b/n knees squeeze Passive stretch HS, piriformis, adductors and hip flexors STM to the right buttock, ITB and adductor Added to HEP  10/09/24: reassessment:  Instructed in: Seated R gluteal self massage/ with tennis ball  Cat /cow, LTR Kinesiotape R post/ lat hip 2 -I pieces extending from R lateral trochanter to lumbar region and second strip from lateral trochanter to sciatic notch   08/30/24:  Nustep L 5 ,6 min In supine R hip ER provokes R lat hip pain and R groin pain In side lying L, R clam shell also provoked R groin pain  Manual: supine R hip distraction with thrust x 3, with minimal reduction in R groin pain with provoking movements Prone for deep soft tissue myofascial release, massage R piriformis with theragun Supine for contract relax for alt resisted hip and knee ext 5 sec holds Supine manually assisted B hip and knee flexion for LTR's  Leg press R LE 20#, 20 reps Adapted bridge to unilateral bridge per medbridge HEP below     08/28/24:  Nustep Level 5 x 6 min Supine for mobilization with movement , with R hip ER. 3 bouts of 10 reps each with inf/lat glides R prox femur Prone for deep soft tissue massage R gluteus medius, piriformis Supine for R hip distraction with end range stretch with hip in open pack position  Reviewed home program of bridging and isometric hip abd/ER Added cat cows to home routine  08/23/24:  Evaluation, education in anatomy of hip, spine, surrounding structures Lateral glides of R prox femur, combined with R hip ER, mob with movement, with improvements in pain free ROM R hip flex and ER Instructed in the ex as described below    PATIENT EDUCATION:  Education details: POC, goals Person educated: Patient Education method: Explanation, Demonstration, Tactile cues, Verbal cues, and Handouts Education  comprehension: verbalized understanding  HOME EXERCISE PROGRAM: Access Code: [email or web address] URL: [email or web address] Date: 08/30/2024 Prepared by: Greig Speaks  Exercises - Hooklying Isometric Hip Abduction with Belt  - 1 x daily - 7 x weekly - 10 reps - 5 hold - Single Leg Bridge  - 1 x daily - 7 x weekly - 3 sets - 10 reps Access Code: [email or web address] URL: [email or web address] Date: 08/23/2024 Prepared by: Greig Credit  Exercises - Bridge on BOSU  - 1 x daily - 3 x weekly - 3 sets - 10 reps - Hooklying Isometric Hip Abduction with Belt  - 1 x daily - 7 x weekly - 10 reps - 5 hold Access Code: [email or web address] URL: [email or web address] Date: 10/13/2024 Prepared by: Ozell Mainland  Exercises - Supine Piriformis Stretch Pulling Heel to Hip  - 2 x daily - 7 x weekly - 1 sets - 5 reps - 30 hold - Supine Hip Adductor Stretch  - 2 x daily - 7 x weekly - 1 sets - 5 reps - 30 hold ASSESSMENT:  CLINICAL IMPRESSION: 10/13/24  Patient doing well, less pain, still tender in the right buttock and pain in the right medial thigh, she is very tight and painful with piriformis stretch and with adductor stretch, added these stretches to her HEP.  Performed STM Asked her to really focus on some light stretches  10/09/24 The patient returned today for additional assessment of R hip / L4/5 radiculopathy pain  following consult  with orthopedist.  R hip ER, abd, very painful radiating to groin and lateral/ post hip.  Also associated weakness R hip ER especially.  Changed our treatment approach, to address soft tissue adhesions in gluteals and R hip external rotators, also added stability with trial with kinesiotape.  Also address possible lumbar involvement with cat cow and lower trunk rotation.  May need progression with deep stabilization ex R hip.        Eval: Patient is a 73 y.o. female  who was evaluated today by physical therapy  for R hip pain. She presents with tenderness R  lateral, posterior hip musculature, weakness R hip for ER , extension, and flexion, pain with scour test R hip, some loss of ROM R hip as compared to L particularly for flexion and ER.  Her lumbar ROM is wfl.  She responded positively to R hip jt/ capsular mobilization today, we also initiated isometric ex for her hip musculature. She should benefit from skilled PT to address her deficits and improve her Sx.  She returns to her PCP in 4 weeks, and if she continues with pain after 2-3 weeks of PT might contact her MD about possible x ray.    OBJECTIVE IMPAIRMENTS: decreased activity tolerance, decreased balance, decreased endurance, decreased knowledge of condition, difficulty walking, decreased ROM, decreased strength, impaired flexibility, postural dysfunction, and pain.   ACTIVITY LIMITATIONS: carrying, standing, sleeping, stairs, locomotion level, and caring for others  PARTICIPATION LIMITATIONS: meal prep, shopping, community activity, and yard work  PERSONAL FACTORS: Age, Behavior pattern, Time since onset of injury/illness/exacerbation, and 1-2 comorbidities: osteopenia,aortic stenosis  are also affecting patient's functional outcome.   REHAB POTENTIAL: Good  CLINICAL DECISION MAKING: Evolving/moderate complexity  EVALUATION COMPLEXITY: Moderate   GOALS: Goals reviewed with patient? Yes  SHORT TERM GOALS: Target date: 2 weeks, 09/06/24 I HEP Baseline:initiated today Goal status: Imet 10/13/24   LONG TERM GOALS: Target date:extended another 8 weeks,  12/04/24  Pt able to climb 8 steps or complete 8 forward step ups with R LE on 8 step , without pain R hip Baseline:  Goal status: INITIAL 10/09/24 some pain not met yet  2.  Single leg stance R Le greater than 30 sec, without pain R hip Baseline: p! 20 sec Goal status: INITIAL 10/09/24; not assessed today  3.  Strength R hip all planes improve to 4+ to 5/5 Baseline: see above chart  Goal status: progress, see chart  4.  ROM  R hip improve to normal Faber's position, normal flexion, Er  Baseline:  Goal status: 10/09/24: provocative    PLAN:  PT FREQUENCY: 2x/week  PT DURATION: 8 weeks  PLANNED INTERVENTIONS: 97110-Therapeutic exercises, 97530- Therapeutic activity, 97112- Neuromuscular re-education, 97535- Self Care, 02859- Manual therapy, [phone]- Ionotophoresis 4mg /ml Dexamethasone, and Joint mobilization  PLAN FOR NEXT SESSION: recommend manual techniques, deep tissue massage R post/ lat hip musculature, R hip mobilization, progress / reassess therex for R hip, emphasis on stabilization, also addreess lumbar flexibility   OBADIAH OZELL ORN, PT, DPT, OCS 10/13/2024, 8:42 AM [facility] [address] [facility] [place], [place], [address] Phone: [phone]   Fax:  [phone]  Patient Details  Name: Clairissa Valvano MRN: [id number] Date of Birth: 1951/05/17 Referring Provider:  Arnaldo Juliene RAMAN, MD  Encounter Date: 10/13/2024   OBADIAH OZELL ORN, PT 10/13/2024, 8:42 AM  [facility] at [facility] [address] [place], [place], [address] Phone: [phone]  Fax:  [phone]

## 2024-10-16 ENCOUNTER — Ambulatory Visit

## 2024-10-18 ENCOUNTER — Other Ambulatory Visit: Payer: Self-pay

## 2024-10-18 ENCOUNTER — Ambulatory Visit

## 2024-10-18 DIAGNOSIS — M25551 Pain in right hip: Secondary | ICD-10-CM

## 2024-10-18 DIAGNOSIS — R262 Difficulty in walking, not elsewhere classified: Secondary | ICD-10-CM | POA: Diagnosis not present

## 2024-10-18 NOTE — Therapy (Signed)
 OUTPATIENT PHYSICAL THERAPY LOWER EXTREMITY TREATMENT/ See note below for Objective Data and Assessment of Progress/Goals.       Patient Name: Dana Bryan MRN: [id number] DOB:[birth year], 73 y.o., female Today's Date: 10/18/2024  END OF SESSION:       Past Medical History:  Diagnosis Date   Anxiety    on meds   Basal cell carcinoma    Bicuspid aortic valve    Cancer (HCC)    Cataract    no sx    Generalized headaches    Heart murmur    per Dr.Tabori   Hyperlipidemia    on meds   Pneumonia    Reactive depression (situational)    on meds   Squamous cell skin cancer, finger    Vasomotor rhinitis    Past Surgical History:  Procedure Laterality Date   BUBBLE STUDY  09/04/2022   Procedure: BUBBLE STUDY;  Surgeon: Barbaraann Darryle Ned, MD;  Location: [facility] ENDOSCOPY;  Service: Cardiovascular;;   COLONOSCOPY  2008   normal    COLONOSCOPY W/ POLYPECTOMY  2021   [facility]br[facility] at [facility], TA and SSPs, 3 yr recall   COLONOSCOPY WITH PROPOFOL   11/30/2023   armbruster   TEE WITHOUT CARDIOVERSION N/A 09/04/2022   Procedure: TRANSESOPHAGEAL ECHOCARDIOGRAM (TEE);  Surgeon: Barbaraann Darryle Ned, MD;  Location: [facility] ENDOSCOPY;  Service: Cardiovascular;  Laterality: N/A;   TONSILLECTOMY  1973   Patient Active Problem List   Diagnosis Date Noted   Electrolyte abnormality 11/10/2022   Aortic stenosis 07/23/2022   LBBB (left bundle branch block) 06/14/2022   Lichen sclerosus 01/07/2022   Osteopenia 09/09/2021   Heart murmur 01/23/2020   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   Routine health maintenance 08/06/2011   B12 deficiency 07/18/2008   Hyperlipidemia 02/15/2008   Anxiety and depression 02/15/2008    PCP: Mahlon Crank, MD  REFERRING PROVIDER: same  REFERRING DIAG: R hip pain  THERAPY DIAG:  No diagnosis found.  Rationale for Evaluation and Treatment: Rehabilitation  ONSET DATE:  6 months , March 2025  SUBJECTIVE:   SUBJECTIVE STATEMENT: 10/18/24   responded well to the kinesiotaping and to the deep tissue massage.  Still pain with stair climbing, points to TFL as site of pain Eval:Reports 6 month h/o R hip pain.  Did take the steroid dose pack prescribed by MD, no improvement.  Pain the most prevalent with climbing steps, and with attempting to sleep, not painful or stiff upon waking  PERTINENT HISTORY: No known trauma, referred to PT from PCP PAIN:  Are you having pain? Yes: NPRS scale: 1 to 8  Pain location: primarily R lat hip and ant thigh, knee, sometimes medial thigh into groin  Pain description: deep pain, ache Aggravating factors: lying down, turning over, stair climbing Relieving factors: lies prone   PRECAUTIONS: None  RED FLAGS: None   WEIGHT BEARING RESTRICTIONS: No  FALLS:  Has patient fallen in last 6 months? No  LIVING ENVIRONMENT: Lives with: lives with their spouse Lives in: House/apartment Stairs: 2 outdoor steps at her home, at daughter's home has 2 flights of steps  Has following equipment at home: None  OCCUPATION: retired  PLOF: Independent  PATIENT GOALS: be able to sleep without pain and be able to walk better  NEXT MD VISIT: 09/26/24  OBJECTIVE:  Note: Objective measures were completed at Evaluation unless otherwise noted.  DIAGNOSTIC FINDINGS: NONE   PATIENT SURVEYS:  Lower Extremity Functional Score: 50 / 80 = 62.5 % Lower Extremity Functional Score: 51 / 80 =  63.7 %  COGNITION: Overall cognitive status: Within functional limits for tasks assessed     SENSATION: WFL  EDEMA:  None noted  MUSCLE LENGTH: Hamstrings: Right wfl deg; Left wfl deg [name]: Right wnl deg; Left -5 deg  POSTURE: rounded shoulders and decreased lumbar lordosis  PALPATION: Very tender R gluteals, piriformis, post hip jt   10/09/24: pain R obturator, R piriformis, R glut medius  LOWER EXTREMITY ROM:  AA ROM Right eval Left eval 10/09/24:  R  Hip flexion 100  wnl  Hip extension     Hip  abduction 30 45 30 with R groin pain  Hip adduction     Hip internal rotation -10 -10   Hip external rotation 15 25 15  with R groin and R post/lat hip pain  Knee flexion     Knee extension     Ankle dorsiflexion     Ankle plantarflexion     Ankle inversion     Ankle eversion      (Blank rows = not tested)  LOWER EXTREMITY MMT:  MMT Right eval Left eval 10/09/24 R  Hip flexion 4+p!    Hip extension lock bridge  4 5   Hip abduction 4+ 4+ 5  Hip adduction     Hip internal rotation     Hip external rotation 3+ p! wnl 3+ /5 with pain R groin and lat hip  Knee flexion     Knee extension 4p! R lat hip    Ankle dorsiflexion     Ankle plantarflexion     Ankle inversion     Ankle eversion      (Blank rows = not tested)  LOWER EXTREMITY SPECIAL TESTS:  Hip special tests: Belvie (FABER) test: negative, Thomas test: negative, and Hip scouring test: positive   FUNCTIONAL TESTS:  10/09/24: forward step ups on 8 step 10 reps no pain  Improved control and baseline pain per pt with theraband wrap as make shift SI belt with the forward step ups Single leg stance: R 20 sec , L greater than 30, pain R lat hip  GAIT: Distance walked: in clinic Assistive device utilized: None Level of assistance: Complete Independence Comments: no specific antalgia or deficits noted   10/20: mild R laterla pelvic shift noted in R weight bearing.                                                                                                                              TREATMENT DATE:  10/18/24:  Nustep level 5 5 min Side lying on L for lumbar extension, rot. SB restriction muscle energy Side lying L for deep cross friction massage lateral and posterior R hip musculature with theragun Kinesiotaping R hip 2 I pieces extending from lateral thigh to mid lumbar to assisted R hip abductors, Er stabilizers.   10/13/24 Nustep level 5 x 5 minutes Leg press 20# 2x10 HS curls 20# 2x10 Feet on ball K2C,  rotation, small bridge, isometric abs Red tband clamshells Ball b/n knees squeeze Passive stretch HS, piriformis, adductors and hip flexors STM to the right buttock, ITB and adductor Added to HEP  10/09/24: reassessment:  Instructed in: Seated R gluteal self massage/ with tennis ball  Cat /cow, LTR Kinesiotape R post/ lat hip 2 -I pieces extending from R lateral trochanter to lumbar region and second strip from lateral trochanter to sciatic notch   08/30/24:  Nustep L 5 ,6 min In supine R hip ER provokes R lat hip pain and R groin pain In side lying L, R clam shell also provoked R groin pain  Manual: supine R hip distraction with thrust x 3, with minimal reduction in R groin pain with provoking movements Prone for deep soft tissue myofascial release, massage R piriformis with theragun Supine for contract relax for alt resisted hip and knee ext 5 sec holds Supine manually assisted B hip and knee flexion for LTR's  Leg press R LE 20#, 20 reps Adapted bridge to unilateral bridge per medbridge HEP below     08/28/24:  Nustep Level 5 x 6 min Supine for mobilization with movement , with R hip ER. 3 bouts of 10 reps each with inf/lat glides R prox femur Prone for deep soft tissue massage R gluteus medius, piriformis Supine for R hip distraction with end range stretch with hip in open pack position  Reviewed home program of bridging and isometric hip abd/ER Added cat cows to home routine  08/23/24:  Evaluation, education in anatomy of hip, spine, surrounding structures Lateral glides of R prox femur, combined with R hip ER, mob with movement, with improvements in pain free ROM R hip flex and ER Instructed in the ex as described below    PATIENT EDUCATION:  Education details: POC, goals Person educated: Patient Education method: Explanation, Demonstration, Tactile cues, Verbal cues, and Handouts Education comprehension: verbalized understanding  HOME EXERCISE PROGRAM: Access  Code: [email or web address] URL: [email or web address] Date: 08/30/2024 Prepared by: Greig Braelen Sproule  Exercises - Hooklying Isometric Hip Abduction with Belt  - 1 x daily - 7 x weekly - 10 reps - 5 hold - Single Leg Bridge  - 1 x daily - 7 x weekly - 3 sets - 10 reps Access Code: [email or web address] URL: [email or web address] Date: 08/23/2024 Prepared by: Greig Credit  Exercises - Bridge on BOSU  - 1 x daily - 3 x weekly - 3 sets - 10 reps - Hooklying Isometric Hip Abduction with Belt  - 1 x daily - 7 x weekly - 10 reps - 5 hold Access Code: [email or web address] URL: [email or web address] Date: 10/13/2024 Prepared by: Ozell Mainland  Exercises - Supine Piriformis Stretch Pulling Heel to Hip  - 2 x daily - 7 x weekly - 1 sets - 5 reps - 30 hold - Supine Hip Adductor Stretch  - 2 x daily - 7 x weekly - 1 sets - 5 reps - 30 hold ASSESSMENT:  CLINICAL IMPRESSION: 10/18/24  Patient with no new Sx.  Reports improvement for about 24 hrs after soft tissue techniques so resumed again today, also reinforced with kinesiotape again. Gave her info on SI belt if she wishes to trial . No new ex added today. Discussed TPDN, may be another treatment to try.    10/09/24 The patient returned today for additional assessment of R hip / L4/5 radiculopathy pain  following consult with orthopedist.  R hip ER, abd, very painful radiating to groin and lateral/ post hip.  Also associated weakness  R hip ER especially.  Changed our treatment approach, to address soft tissue adhesions in gluteals and R hip external rotators, also added stability with trial with kinesiotape.  Also address possible lumbar involvement with cat cow and lower trunk rotation.  May need progression with deep stabilization ex R hip.        Eval: Patient is a 73 y.o. female  who was evaluated today by physical therapy  for R hip pain. She presents with tenderness R lateral, posterior hip musculature, weakness R hip for ER , extension, and  flexion, pain with scour test R hip, some loss of ROM R hip as compared to L particularly for flexion and ER.  Her lumbar ROM is wfl.  She responded positively to R hip jt/ capsular mobilization today, we also initiated isometric ex for her hip musculature. She should benefit from skilled PT to address her deficits and improve her Sx.  She returns to her PCP in 4 weeks, and if she continues with pain after 2-3 weeks of PT might contact her MD about possible x ray.    OBJECTIVE IMPAIRMENTS: decreased activity tolerance, decreased balance, decreased endurance, decreased knowledge of condition, difficulty walking, decreased ROM, decreased strength, impaired flexibility, postural dysfunction, and pain.   ACTIVITY LIMITATIONS: carrying, standing, sleeping, stairs, locomotion level, and caring for others  PARTICIPATION LIMITATIONS: meal prep, shopping, community activity, and yard work  PERSONAL FACTORS: Age, Behavior pattern, Time since onset of injury/illness/exacerbation, and 1-2 comorbidities: osteopenia,aortic stenosis  are also affecting patient's functional outcome.   REHAB POTENTIAL: Good  CLINICAL DECISION MAKING: Evolving/moderate complexity  EVALUATION COMPLEXITY: Moderate   GOALS: Goals reviewed with patient? Yes  SHORT TERM GOALS: Target date: 2 weeks, 09/06/24 I HEP Baseline:initiated today Goal status: Imet 10/13/24   LONG TERM GOALS: Target date:extended another 8 weeks,  12/04/24  Pt able to climb 8 steps or complete 8 forward step ups with R LE on 8 step , without pain R hip Baseline:  Goal status: INITIAL 10/09/24 some pain not met yet  2.  Single leg stance R Le greater than 30 sec, without pain R hip Baseline: p! 20 sec Goal status: INITIAL 10/09/24; not assessed today  3.  Strength R hip all planes improve to 4+ to 5/5 Baseline: see above chart  Goal status: progress, see chart  4.  ROM R hip improve to normal Faber's position, normal flexion, Er  Baseline:   Goal status: 10/09/24: provocative    PLAN:  PT FREQUENCY: 2x/week  PT DURATION: 8 weeks  PLANNED INTERVENTIONS: 97110-Therapeutic exercises, 97530- Therapeutic activity, 97112- Neuromuscular re-education, 97535- Self Care, [id number]- Manual therapy, [phone]- Ionotophoresis 4mg /ml Dexamethasone, and Joint mobilization  PLAN FOR NEXT SESSION: recommend manual techniques, deep tissue massage R post/ lat hip musculature, R hip mobilization, progress / reassess therex for R hip, emphasis on stabilization, also addreess lumbar flexibility   Greig LITTIE Credit, PT, DPT, OCS 10/18/2024, 12:21 PM [facility] [place], [place], [address] Phone: [phone]   Fax:  [phone]  Patient Details  Name: Dana Bryan MRN: [id number] Date of Birth: 1951-01-28 Referring Provider:  Arnaldo Juliene RAMAN, MD  Encounter Date: 10/18/2024   Greig LITTIE Credit, PT 10/18/2024, 12:21 PM  [facility] [address] [facility] [place], [place], [address] Phone: [phone]   Fax:  [phone]

## 2024-10-27 ENCOUNTER — Ambulatory Visit: Attending: Sports Medicine

## 2024-10-27 DIAGNOSIS — M25551 Pain in right hip: Secondary | ICD-10-CM | POA: Diagnosis not present

## 2024-10-27 DIAGNOSIS — R262 Difficulty in walking, not elsewhere classified: Secondary | ICD-10-CM | POA: Diagnosis not present

## 2024-10-27 NOTE — Therapy (Signed)
 OUTPATIENT PHYSICAL THERAPY LOWER EXTREMITY TREATMENT/ See note below for Objective Data and Assessment of Progress/Goals.       Patient Name: Dana Bryan MRN: [id number] DOB:02-23-1951, 73 y.o., female Today's Date: 10/27/2024  END OF SESSION:  PT End of Session - 10/27/24 1018     Visit Number 7    Date for Recertification  12/04/24    PT Start Time 1015    PT Stop Time 1100    PT Time Calculation (min) 45 min    Activity Tolerance Patient tolerated treatment well    Behavior During Therapy WFL for tasks assessed/performed              Past Medical History:  Diagnosis Date   Anxiety    on meds   Basal cell carcinoma    Bicuspid aortic valve    Cancer (HCC)    Cataract    no sx    Generalized headaches    Heart murmur    per Dr.Tabori   Hyperlipidemia    on meds   Pneumonia    Reactive depression (situational)    on meds   Squamous cell skin cancer, finger    Vasomotor rhinitis    Past Surgical History:  Procedure Laterality Date   BUBBLE STUDY  09/04/2022   Procedure: BUBBLE STUDY;  Surgeon: Barbaraann Darryle Ned, MD;  Location: [facility] ENDOSCOPY;  Service: Cardiovascular;;   COLONOSCOPY  2008   normal    COLONOSCOPY W/ POLYPECTOMY  2021   [facility] at [facility], TA and SSPs, 3 yr recall   COLONOSCOPY WITH PROPOFOL   11/30/2023   [facility]   TEE WITHOUT CARDIOVERSION N/A 09/04/2022   Procedure: TRANSESOPHAGEAL ECHOCARDIOGRAM (TEE);  Surgeon: Barbaraann Darryle Ned, MD;  Location: [facility] END[facility]COPY;  Service: Cardiovascular;  Laterality: N/A;   TONSILLECTOMY  1973   Patient Active Problem List   Diagnosis Date Noted   Electrolyte abnormality 11/10/2022   Aortic stenosis 07/23/2022   LBBB (left bundle branch block) 06/14/2022   Lichen sclerosus 01/07/2022   Osteopenia 09/09/2021   Heart murmur 01/23/2020   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   Routine health maintenance 08/06/2011   B12 deficiency 07/18/2008   Hyperlipidemia 02/15/2008   Anxiety and  depression 02/15/2008    PCP: Mahlon Crank, MD  REFERRING PROVIDER: same  REFERRING DIAG: R hip pain  THERAPY DIAG:  Pain in right hip  Difficulty in walking, not elsewhere classified  Rationale for Evaluation and Treatment: Rehabilitation  ONSET DATE:  6 months , March 2025  SUBJECTIVE:  I have been doing some exercises at home they seem to help a little bit on the day to day basis.   SUBJECTIVE STATEMENT: 10/18/24  responded well to the kinesiotaping and to the deep tissue massage.  Still pain with stair climbing, points to TFL as site of pain Eval:Reports 6 month h/o R hip pain.  Did take the steroid dose pack prescribed by MD, no improvement.  Pain the most prevalent with climbing steps, and with attempting to sleep, not painful or stiff upon waking  PERTINENT HISTORY: No known trauma, referred to PT from PCP PAIN:  Are you having pain? Yes: NPRS scale: 1 to 8  Pain location: primarily R lat hip and ant thigh, knee, sometimes medial thigh into groin  Pain description: deep pain, ache Aggravating factors: lying down, turning over, stair climbing Relieving factors: lies prone   PRECAUTIONS: None  RED FLAGS: None   WEIGHT BEARING RESTRICTIONS: No  FALLS:  Has patient fallen in last  6 months? No  LIVING ENVIRONMENT: Lives with: lives with their spouse Lives in: House/apartment Stairs: 2 outdoor steps at her home, at daughter's home has 2 flights of steps  Has following equipment at home: None  OCCUPATION: retired  PLOF: Independent  PATIENT GOALS: be able to sleep without pain and be able to walk better  NEXT MD VISIT: 09/26/24  OBJECTIVE:  Note: Objective measures were completed at Evaluation unless otherwise noted.  DIAGNOSTIC FINDINGS: NONE   PATIENT SURVEYS:  Lower Extremity Functional Score: 50 / 80 = 62.5 % Lower Extremity Functional Score: 51 / 80 = 63.7 %  COGNITION: Overall cognitive status: Within functional limits for tasks  assessed     SENSATION: WFL  EDEMA:  None noted  MUSCLE LENGTH: Hamstrings: Right wfl deg; Left wfl deg [name]: Right wnl deg; Left -5 deg  POSTURE: rounded shoulders and decreased lumbar lordosis  PALPATION: Very tender R gluteals, piriformis, post hip jt   10/09/24: pain R obturator, R piriformis, R glut medius  LOWER EXTREMITY ROM:  AA ROM Right eval Left eval 10/09/24:  R  Hip flexion 100  wnl  Hip extension     Hip abduction 30 45 30 with R groin pain  Hip adduction     Hip internal rotation -10 -10   Hip external rotation 15 25 15  with R groin and R post/lat hip pain  Knee flexion     Knee extension     Ankle dorsiflexion     Ankle plantarflexion     Ankle inversion     Ankle eversion      (Blank rows = not tested)  LOWER EXTREMITY MMT:  MMT Right eval Left eval 10/09/24 R  Hip flexion 4+p!    Hip extension lock bridge  4 5   Hip abduction 4+ 4+ 5  Hip adduction     Hip internal rotation     Hip external rotation 3+ p! wnl 3+ /5 with pain R groin and lat hip  Knee flexion     Knee extension 4p! R lat hip    Ankle dorsiflexion     Ankle plantarflexion     Ankle inversion     Ankle eversion      (Blank rows = not tested)  LOWER EXTREMITY SPECIAL TESTS:  Hip special tests: Belvie (FABER) test: negative, Thomas test: negative, and Hip scouring test: positive   FUNCTIONAL TESTS:  10/09/24: forward step ups on 8 step 10 reps no pain  Improved control and baseline pain per pt with theraband wrap as make shift SI belt with the forward step ups Single leg stance: R 20 sec , L greater than 30, pain R lat hip  GAIT: Distance walked: in clinic Assistive device utilized: None Level of assistance: Complete Independence Comments: no specific antalgia or deficits noted   10/20: mild R laterla pelvic shift noted in R weight bearing.  TREATMENT DATE:  10/27/24: Nustep L5x5min -Hamstring curls seated w green band 2x10 -Hip adduction with ball 2x10 -Hip abduction 2x10 green band  -glute bridge 2x10 -Step Ups 2x10 6 -STM R hip   10/18/24:  Nustep level 5 5 min Side lying on L for lumbar extension, rot. SB restriction muscle energy Side lying L for deep cross friction massage lateral and posterior R hip musculature with theragun Kinesiotaping R hip 2 I pieces extending from lateral thigh to mid lumbar to assisted R hip abductors, Er stabilizers.   10/13/24 Nustep level 5 x 5 minutes Leg press 20# 2x10 HS curls 20# 2x10 Feet on ball K2C, rotation, small bridge, isometric abs Red tband clamshells Ball b/n knees squeeze Passive stretch HS, piriformis, adductors and hip flexors STM to the right buttock, ITB and adductor Added to HEP  10/09/24: reassessment:  Instructed in: Seated R gluteal self massage/ with tennis ball  Cat /cow, LTR Kinesiotape R post/ lat hip 2 -I pieces extending from R lateral trochanter to lumbar region and second strip from lateral trochanter to sciatic notch   08/30/24:  Nustep L 5 ,6 min In supine R hip ER provokes R lat hip pain and R groin pain In side lying L, R clam shell also provoked R groin pain  Manual: supine R hip distraction with thrust x 3, with minimal reduction in R groin pain with provoking movements Prone for deep soft tissue myofascial release, massage R piriformis with theragun Supine for contract relax for alt resisted hip and knee ext 5 sec holds Supine manually assisted B hip and knee flexion for LTR's  Leg press R LE 20#, 20 reps Adapted bridge to unilateral bridge per medbridge HEP below     08/28/24:  Nustep Level 5 x 6 min Supine for mobilization with movement , with R hip ER. 3 bouts of 10 reps each with inf/lat glides R prox femur Prone for deep soft tissue massage R gluteus medius, piriformis Supine for R hip distraction with end range stretch  with hip in open pack position  Reviewed home program of bridging and isometric hip abd/ER Added cat cows to home routine  08/23/24:  Evaluation, education in anatomy of hip, spine, surrounding structures Lateral glides of R prox femur, combined with R hip ER, mob with movement, with improvements in pain free ROM R hip flex and ER Instructed in the ex as described below    PATIENT EDUCATION:  Education details: POC, goals Person educated: Patient Education method: Explanation, Demonstration, Tactile cues, Verbal cues, and Handouts Education comprehension: verbalized understanding  HOME EXERCISE PROGRAM: Access Code: [email or web address] URL: [email or web address] Date: 08/30/2024 Prepared by: Greig Speaks  Exercises - Hooklying Isometric Hip Abduction with Belt  - 1 x daily - 7 x weekly - 10 reps - 5 hold - Single Leg Bridge  - 1 x daily - 7 x weekly - 3 sets - 10 reps Access Code: [email or web address] URL: [email or web address] Date: 08/23/2024 Prepared by: Greig Credit  Exercises - Bridge on BOSU  - 1 x daily - 3 x weekly - 3 sets - 10 reps - Hooklying Isometric Hip Abduction with Belt  - 1 x daily - 7 x weekly - 10 reps - 5 hold Access Code: [email or web address] URL: [email or web address] Date: 10/13/2024 Prepared by: Ozell Mainland  Exercises - Supine Piriformis Stretch Pulling Heel to Hip  - 2 x daily - 7 x weekly - 1 sets - 5 reps - 30 hold - Supine Hip Adductor Stretch  - 2  x daily - 7 x weekly - 1 sets - 5 reps - 30 hold ASSESSMENT:  CLINICAL IMPRESSION: 10/28/23: Pt reports some tightness in the R hip today with no pain at start of session. Pt exhibits pain with movement of hip flexion and extension but ability to navigate steps and ambulation with good movement quality. Pt tolerated treatment well with no increase in pain and felt relief with STM. Pt requires strengthening for musculature surrounding the hip joint and given stretches to  decrease stiffness at  the hip joint. Pt will benefit from continued PT to increase strength and decrease stiffness.   10/09/24 The patient returned today for additional assessment of R hip / L4/5 radiculopathy pain  following consult with orthopedist.  R hip ER, abd, very painful radiating to groin and lateral/ post hip.  Also associated weakness R hip ER especially.  Changed our treatment approach, to address soft tissue adhesions in gluteals and R hip external rotators, also added stability with trial with kinesiotape.  Also address possible lumbar involvement with cat cow and lower trunk rotation.  May need progression with deep stabilization ex R hip.        Eval: Patient is a 73 y.o. female  who was evaluated today by physical therapy  for R hip pain. She presents with tenderness R lateral, posterior hip musculature, weakness R hip for ER , extension, and flexion, pain with scour test R hip, some loss of ROM R hip as compared to L particularly for flexion and ER.  Her lumbar ROM is wfl.  She responded positively to R hip jt/ capsular mobilization today, we also initiated isometric ex for her hip musculature. She should benefit from skilled PT to address her deficits and improve her Sx.  She returns to her PCP in 4 weeks, and if she continues with pain after 2-3 weeks of PT might contact her MD about possible x ray.    OBJECTIVE IMPAIRMENTS: decreased activity tolerance, decreased balance, decreased endurance, decreased knowledge of condition, difficulty walking, decreased ROM, decreased strength, impaired flexibility, postural dysfunction, and pain.   ACTIVITY LIMITATIONS: carrying, standing, sleeping, stairs, locomotion level, and caring for others  PARTICIPATION LIMITATIONS: meal prep, shopping, community activity, and yard work  PERSONAL FACTORS: Age, Behavior pattern, Time since onset of injury/illness/exacerbation, and 1-2 comorbidities: osteopenia,aortic stenosis  are also affecting patient's functional outcome.    REHAB POTENTIAL: Good  CLINICAL DECISION MAKING: Evolving/moderate complexity  EVALUATION COMPLEXITY: Moderate   GOALS: Goals reviewed with patient? Yes  SHORT TERM GOALS: Target date: 2 weeks, 09/06/24 I HEP Baseline:initiated today Goal status: Imet 10/13/24   LONG TERM GOALS: Target date:extended another 8 weeks,  12/04/24  Pt able to climb 8 steps or complete 8 forward step ups with R LE on 8 step , without pain R hip Baseline:  Goal status: INITIAL 10/09/24 some pain not met yet; Still with pain 10/27/24  2.  Single leg stance R Le greater than 30 sec, without pain R hip Baseline: p! 20 sec Goal status: INITIAL 10/09/24; not assessed today; RLE 30s LLE 13s 10/27/24  3.  Strength R hip all planes improve to 4+ to 5/5 Baseline: see above chart  Goal status: progress, see chart  4.  ROM R hip improve to normal Faber's position, normal flexion, Er  Baseline:  Goal status: 10/09/24: provocative    PLAN:  PT FREQUENCY: 2x/week  PT DURATION: 8 weeks  PLANNED INTERVENTIONS: 97110-Therapeutic exercises, 97530- Therapeutic activity, [id number]- Neuromuscular re-education, 97535- Self Care,  02859- Manual therapy, [phone]- Ionotophoresis 4mg /ml Dexamethasone, and Joint mobilization  PLAN FOR NEXT SESSION: recommend manual techniques, deep tissue massage R post/ lat hip musculature, R hip mobilization, progress / reassess therex for R hip, emphasis on stabilization, also addreess lumbar flexibility   Thersia Alder, Student-PT, DPT, OCS 10/27/2024, 11:10 AM [facility] [address] [facility] [place], [place], [address] Phone: [phone]   Fax:  [phone]  Patient Details  Name: Dana Bryan MRN: [id number] Date of Birth: [birth year] Referring Provider:  Arnaldo Juliene RAMAN, MD  Encounter Date: 10/27/2024   Thersia Alder, Student-PT 10/27/2024, 11:10 AM  [place] [facility] at [facility] [address] [facility] [place], [place], [address] Phone: [phone]   Fax:  [phone]

## 2024-10-30 ENCOUNTER — Encounter: Payer: Self-pay | Admitting: Physical Therapy

## 2024-10-30 ENCOUNTER — Ambulatory Visit
Admission: RE | Admit: 2024-10-30 | Discharge: 2024-10-30 | Disposition: A | Discharge status: Home / Self Care | Source: Ambulatory Visit | Attending: Family Medicine | Admitting: Family Medicine

## 2024-10-30 ENCOUNTER — Ambulatory Visit: Admitting: Physical Therapy

## 2024-10-30 DIAGNOSIS — Z1231 Encounter for screening mammogram for malignant neoplasm of breast: Secondary | ICD-10-CM | POA: Diagnosis not present

## 2024-10-30 DIAGNOSIS — M25551 Pain in right hip: Secondary | ICD-10-CM | POA: Diagnosis not present

## 2024-10-30 DIAGNOSIS — R262 Difficulty in walking, not elsewhere classified: Secondary | ICD-10-CM

## 2024-10-30 NOTE — Therapy (Signed)
 OUTPATIENT PHYSICAL THERAPY LOWER EXTREMITY TREATMENT/      Patient Name: Dana Bryan MRN: [id number] DOB:12-31-1950, 73 y.o., female Today's Date: 10/30/2024  END OF SESSION:  PT End of Session - 10/30/24 1008     Visit Number 8    Date for Recertification  12/04/24    PT Start Time 1008    PT Stop Time 1054    PT Time Calculation (min) 46 min    Activity Tolerance Patient tolerated treatment well    Behavior During Therapy WFL for tasks assessed/performed              Past Medical History:  Diagnosis Date   Anxiety    on meds   Basal cell carcinoma    Bicuspid aortic valve    Cancer (HCC)    Cataract    no sx    Generalized headaches    Heart murmur    per Dr.Tabori   Hyperlipidemia    on meds   Pneumonia    Reactive depression (situational)    on meds   Squamous cell skin cancer, finger    Vasomotor rhinitis    Past Surgical History:  Procedure Laterality Date   BUBBLE STUDY  09/04/2022   Procedure: BUBBLE STUDY;  Surgeon: Barbaraann Darryle Ned, MD;  Location: [facility] ENDOSCOPY;  Service: Cardiovascular;;   COLONOSCOPY  2008   normal    COLONOSCOPY W/ POLYPECTOMY  2021   [facility] at [facility], TA and SSPs, 3 yr recall   COLONOSCOPY WITH PROPOFOL   11/30/2023   [facility]   TEE WITHOUT CARDIOVERSION N/A 09/04/2022   Procedure: TRANSESOPHAGEAL ECHOCARDIOGRAM (TEE);  Surgeon: Barbaraann Darryle Ned, MD;  Location: [facility] ENDOSCOPY;  Service: Cardiovascular;  Laterality: N/A;   TONSILLECTOMY  1973   Patient Active Problem List   Diagnosis Date Noted   Electrolyte abnormality 11/10/2022   Aortic stenosis 07/23/2022   LBBB (left bundle branch block) 06/14/2022   Lichen sclerosus 01/07/2022   Osteopenia 09/09/2021   Heart murmur 01/23/2020   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   Routine health maintenance 08/06/2011   B12 deficiency 07/18/2008   Hyperlipidemia 02/15/2008   Anxiety and depression 02/15/2008    PCP: Mahlon Crank, MD  REFERRING  PROVIDER: same  REFERRING DIAG: R hip pain  THERAPY DIAG:  Pain in right hip  Difficulty in walking, not elsewhere classified  Rationale for Evaluation and Treatment: Rehabilitation  ONSET DATE:  6 months , March 2025  SUBJECTIVE:  I get relief but it just does not last, pain is mostly up steps  SUBJECTIVE STATEMENT: 10/18/24  responded well to the kinesiotaping and to the deep tissue massage.  Still pain with stair climbing, points to TFL as site of pain Eval:Reports 6 month h/o R hip pain.  Did take the steroid dose pack prescribed by MD, no improvement.  Pain the most prevalent with climbing steps, and with attempting to sleep, not painful or stiff upon waking  PERTINENT HISTORY: No known trauma, referred to PT from PCP PAIN:  Are you having pain? Yes: NPRS scale: 1 to 8  Pain location: primarily R lat hip and ant thigh, knee, sometimes medial thigh into groin  Pain description: deep pain, ache Aggravating factors: lying down, turning over, stair climbing Relieving factors: lies prone   PRECAUTIONS: None  RED FLAGS: None   WEIGHT BEARING RESTRICTIONS: No  FALLS:  Has patient fallen in last 6 months? No  LIVING ENVIRONMENT: Lives with: lives with their spouse Lives in: House/apartment Stairs: 2 outdoor steps  at her home, at daughter's home has 2 flights of steps  Has following equipment at home: None  OCCUPATION: retired  PLOF: Independent  PATIENT GOALS: be able to sleep without pain and be able to walk better  NEXT MD VISIT: 09/26/24  OBJECTIVE:  Note: Objective measures were completed at Evaluation unless otherwise noted.  DIAGNOSTIC FINDINGS: NONE   PATIENT SURVEYS:  Lower Extremity Functional Score: 50 / 80 = 62.5 % Lower Extremity Functional Score: 51 / 80 = 63.7 %  COGNITION: Overall cognitive status: Within functional limits for tasks assessed     SENSATION: WFL  EDEMA:  None noted  MUSCLE LENGTH: Hamstrings: Right wfl deg; Left wfl  deg [name]: Right wnl deg; Left -5 deg  POSTURE: rounded shoulders and decreased lumbar lordosis  PALPATION: Very tender R gluteals, piriformis, post hip jt   10/09/24: pain R obturator, R piriformis, R glut medius  LOWER EXTREMITY ROM:  AA ROM Right eval Left eval 10/09/24:  R  Hip flexion 100  wnl  Hip extension     Hip abduction 30 45 30 with R groin pain  Hip adduction     Hip internal rotation -10 -10   Hip external rotation 15 25 15  with R groin and R post/lat hip pain  Knee flexion     Knee extension     Ankle dorsiflexion     Ankle plantarflexion     Ankle inversion     Ankle eversion      (Blank rows = not tested)  LOWER EXTREMITY MMT:  MMT Right eval Left eval 10/09/24 R  Hip flexion 4+p!    Hip extension lock bridge  4 5   Hip abduction 4+ 4+ 5  Hip adduction     Hip internal rotation     Hip external rotation 3+ p! wnl 3+ /5 with pain R groin and lat hip  Knee flexion     Knee extension 4p! R lat hip    Ankle dorsiflexion     Ankle plantarflexion     Ankle inversion     Ankle eversion      (Blank rows = not tested)  LOWER EXTREMITY SPECIAL TESTS:  Hip special tests: Belvie (FABER) test: negative, Thomas test: negative, and Hip scouring test: positive   FUNCTIONAL TESTS:  10/09/24: forward step ups on 8 step 10 reps no pain  Improved control and baseline pain per pt with theraband wrap as make shift SI belt with the forward step ups Single leg stance: R 20 sec , L greater than 30, pain R lat hip  GAIT: Distance walked: in clinic Assistive device utilized: None Level of assistance: Complete Independence Comments: no specific antalgia or deficits noted   10/20: mild R laterla pelvic shift noted in R weight bearing.  TREATMENT DATE:  10/30/24 Nustep level 5 x 6 minutes Leg curls 20# 5# leg ext 2.5# hip  abduction right lateral hip pain 2.5# hip extension Feet on ball K2C, rotation, bridge, isometric abs Passive stretch right HS, piriformis and adductors STM with the Tgun to the right ITB and right glutes Manual sheet traction I did apply an ionto patch with 80mA dose with dex to the right GT area, did not charge due to her insurance   10/27/24: Nustep L5x5min -Hamstring curls seated w green band 2x10 -Hip adduction with ball 2x10 -Hip abduction 2x10 green band  -glute bridge 2x10 -Step Ups 2x10 6 -STM R hip   10/18/24:  Nustep level 5 5 min Side lying on L for lumbar extension, rot. SB restriction muscle energy Side lying L for deep cross friction massage lateral and posterior R hip musculature with theragun Kinesiotaping R hip 2 I pieces extending from lateral thigh to mid lumbar to assisted R hip abductors, Er stabilizers.   10/13/24 Nustep level 5 x 5 minutes Leg press 20# 2x10 HS curls 20# 2x10 Feet on ball K2C, rotation, small bridge, isometric abs Red tband clamshells Ball b/n knees squeeze Passive stretch HS, piriformis, adductors and hip flexors STM to the right buttock, ITB and adductor Added to HEP  10/09/24: reassessment:  Instructed in: Seated R gluteal self massage/ with tennis ball  Cat /cow, LTR Kinesiotape R post/ lat hip 2 -I pieces extending from R lateral trochanter to lumbar region and second strip from lateral trochanter to sciatic notch   08/30/24:  Nustep L 5 ,6 min In supine R hip ER provokes R lat hip pain and R groin pain In side lying L, R clam shell also provoked R groin pain  Manual: supine R hip distraction with thrust x 3, with minimal reduction in R groin pain with provoking movements Prone for deep soft tissue myofascial release, massage R piriformis with theragun Supine for contract relax for alt resisted hip and knee ext 5 sec holds Supine manually assisted B hip and knee flexion for LTR's  Leg press R LE 20#, 20 reps Adapted  bridge to unilateral bridge per medbridge HEP below     08/28/24:  Nustep Level 5 x 6 min Supine for mobilization with movement , with R hip ER. 3 bouts of 10 reps each with inf/lat glides R prox femur Prone for deep soft tissue massage R gluteus medius, piriformis Supine for R hip distraction with end range stretch with hip in open pack position  Reviewed home program of bridging and isometric hip abd/ER Added cat cows to home routine  08/23/24:  Evaluation, education in anatomy of hip, spine, surrounding structures Lateral glides of R prox femur, combined with R hip ER, mob with movement, with improvements in pain free ROM R hip flex and ER Instructed in the ex as described below    PATIENT EDUCATION:  Education details: POC, goals Person educated: Patient Education method: Explanation, Demonstration, Tactile cues, Verbal cues, and Handouts Education comprehension: verbalized understanding  HOME EXERCISE PROGRAM: Access Code: [email or web address] URL: [email or web address] Date: 08/30/2024 Prepared by: Greig Speaks  Exercises - Hooklying Isometric Hip Abduction with Belt  - 1 x daily - 7 x weekly - 10 reps - 5 hold - Single Leg Bridge  - 1 x daily - 7 x weekly - 3 sets - 10 reps Access Code: [email or web address] URL: [email or web address] Date: 08/23/2024 Prepared by: Greig Credit  Exercises - Bridge on BOSU  - 1 x daily -  3 x weekly - 3 sets - 10 reps - Hooklying Isometric Hip Abduction with Belt  - 1 x daily - 7 x weekly - 10 reps - 5 hold Access Code: [email or web address] URL: [email or web address] Date: 10/13/2024 Prepared by: Ozell Mainland  Exercises - Supine Piriformis Stretch Pulling Heel to Hip  - 2 x daily - 7 x weekly - 1 sets - 5 reps - 30 hold - Supine Hip Adductor Stretch  - 2 x daily - 7 x weekly - 1 sets - 5 reps - 30 hold ASSESSMENT:  CLINICAL IMPRESSION: 10/30/24  Patient overall reports less tenderness, really only has pain with going up  steps.  She did have some lateral right hip pain with abduction, I did add some manual sheet traction to see if this would help the groin pain, I did ionto but did not charge due to her insurance not covering, I feel like this would be a good thing to try to allow MD to make a better decision on future treatment  10/09/24 The patient returned today for additional assessment of R hip / L4/5 radiculopathy pain  following consult with orthopedist.  R hip ER, abd, very painful radiating to groin and lateral/ post hip.  Also associated weakness R hip ER especially.  Changed our treatment approach, to address soft tissue adhesions in gluteals and R hip external rotators, also added stability with trial with kinesiotape.  Also address possible lumbar involvement with cat cow and lower trunk rotation.  May need progression with deep stabilization ex R hip.        Eval: Patient is a 73 y.o. female  who was evaluated today by physical therapy  for R hip pain. She presents with tenderness R lateral, posterior hip musculature, weakness R hip for ER , extension, and flexion, pain with scour test R hip, some loss of ROM R hip as compared to L particularly for flexion and ER.  Her lumbar ROM is wfl.  She responded positively to R hip jt/ capsular mobilization today, we also initiated isometric ex for her hip musculature. She should benefit from skilled PT to address her deficits and improve her Sx.  She returns to her PCP in 4 weeks, and if she continues with pain after 2-3 weeks of PT might contact her MD about possible x ray.    OBJECTIVE IMPAIRMENTS: decreased activity tolerance, decreased balance, decreased endurance, decreased knowledge of condition, difficulty walking, decreased ROM, decreased strength, impaired flexibility, postural dysfunction, and pain.   ACTIVITY LIMITATIONS: carrying, standing, sleeping, stairs, locomotion level, and caring for others  PARTICIPATION LIMITATIONS: meal prep, shopping, community  activity, and yard work  PERSONAL FACTORS: Age, Behavior pattern, Time since onset of injury/illness/exacerbation, and 1-2 comorbidities: osteopenia,aortic stenosis  are also affecting patient's functional outcome.   REHAB POTENTIAL: Good  CLINICAL DECISION MAKING: Evolving/moderate complexity  EVALUATION COMPLEXITY: Moderate   GOALS: Goals reviewed with patient? Yes  SHORT TERM GOALS: Target date: 2 weeks, 09/06/24 I HEP Baseline:initiated today Goal status: Imet 10/13/24   LONG TERM GOALS: Target date:extended another 8 weeks,  12/04/24  Pt able to climb 8 steps or complete 8 forward step ups with R LE on 8 step , without pain R hip Baseline:  Goal status: INITIAL 10/09/24 some pain not met yet; Still with pain 10/27/24  2.  Single leg stance R Le greater than 30 sec, without pain R hip Baseline: p! 20 sec Goal status: INITIAL 10/09/24; not assessed today; RLE 30s LLE 13s 10/27/24  3.  Strength R hip all planes improve to 4+ to 5/5 Baseline: see above chart  Goal status: progress, see chart  4.  ROM R hip improve to normal Faber's position, normal flexion, Er  Baseline:  Goal status: 10/09/24: provocative    PLAN:  PT FREQUENCY: 2x/week  PT DURATION: 8 weeks  PLANNED INTERVENTIONS: 97110-Therapeutic exercises, 97530- Therapeutic activity, 97112- Neuromuscular re-education, 97535- Self Care, 02859- Manual therapy, [phone]- Ionotophoresis 4mg /ml Dexamethasone, and Joint mobilization  PLAN FOR NEXT SESSION: recommend manual techniques, deep tissue massage R post/ lat hip musculature, R hip mobilization, progress / reassess therex for R hip, emphasis on stabilization, also addreess lumbar flexibility, see how today's treatment went   OBADIAH OZELL ORN, PT 10/30/2024, 10:09 AM [facility] at [facility] [place], [place], [address] Phone: [phone]   Fax:  [phone]

## 2024-11-01 ENCOUNTER — Ambulatory Visit: Admitting: Physical Therapy

## 2024-11-01 ENCOUNTER — Encounter: Payer: Self-pay | Admitting: Physical Therapy

## 2024-11-01 DIAGNOSIS — M25551 Pain in right hip: Secondary | ICD-10-CM | POA: Diagnosis not present

## 2024-11-01 DIAGNOSIS — R262 Difficulty in walking, not elsewhere classified: Secondary | ICD-10-CM

## 2024-11-01 NOTE — Therapy (Signed)
 OUTPATIENT PHYSICAL THERAPY LOWER EXTREMITY TREATMENT/      Patient Name: Dana Bryan MRN: [id number] DOB:1951/04/05, 73 y.o., female Today's Date: 11/01/2024  END OF SESSION:  PT End of Session - 11/01/24 0926     Visit Number 9    Date for Recertification  12/04/24    PT Start Time 0926    PT Stop Time 1014    PT Time Calculation (min) 48 min    Activity Tolerance Patient tolerated treatment well    Behavior During Therapy [facility] for tasks assessed/performed              Past Medical History:  Diagnosis Date   Anxiety    on meds   Basal cell carcinoma    Bicuspid aortic valve    Cancer (HCC)    Cataract    no sx    Generalized headaches    Heart murmur    per Dr.Tabori   Hyperlipidemia    on meds   Pneumonia    Reactive depression (situational)    on meds   Squamous cell skin cancer, finger    Vasomotor rhinitis    Past Surgical History:  Procedure Laterality Date   BUBBLE STUDY  09/04/2022   Procedure: BUBBLE STUDY;  Surgeon: Barbaraann Darryle Ned, MD;  Location: [facility] ENDOSCOPY;  Service: Cardiovascular;;   COLONOSCOPY  2008   normal    COLONOSCOPY W/ POLYPECTOMY  2021   [facility] at [facility], TA and SSPs, 3 yr recall   COLONOSCOPY WITH PROPOFOL   11/30/2023   [facility]   TEE WITHOUT CARDIOVERSION N/A 09/04/2022   Procedure: TRANSESOPHAGEAL ECHOCARDIOGRAM (TEE);  Surgeon: Barbaraann Darryle Ned, MD;  Location: [facility] ENDOSCOPY;  Service: Cardiovascular;  Laterality: N/A;   TONSILLECTOMY  1973   Patient Active Problem List   Diagnosis Date Noted   Electrolyte abnormality 11/10/2022   Aortic stenosis 07/23/2022   LBBB (left bundle branch block) 06/14/2022   Lichen sclerosus 01/07/2022   Osteopenia 09/09/2021   Heart murmur 01/23/2020   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   Routine health maintenance 08/06/2011   B12 deficiency 07/18/2008   Hyperlipidemia 02/15/2008   Anxiety and depression 02/15/2008    PCP: Mahlon Crank, MD  REFERRING  PROVIDER: same  REFERRING DIAG: R hip pain  THERAPY DIAG:  Pain in right hip  Difficulty in walking, not elsewhere classified  Rationale for Evaluation and Treatment: Rehabilitation  ONSET DATE:  6 months , March 2025   SUBJECTIVE STATEMENT: I think it is a little better, stairs last night was not as sore   10/18/24  responded well to the kinesiotaping and to the deep tissue massage.  Still pain with stair climbing, points to TFL as site of pain Eval:Reports 6 month h/o R hip pain.  Did take the steroid dose pack prescribed by MD, no improvement.  Pain the most prevalent with climbing steps, and with attempting to sleep, not painful or stiff upon waking  PERTINENT HISTORY: No known trauma, referred to PT from PCP PAIN:  Are you having pain? Yes: NPRS scale: 1 to 8  Pain location: primarily R lat hip and ant thigh, knee, sometimes medial thigh into groin  Pain description: deep pain, ache Aggravating factors: lying down, turning over, stair climbing Relieving factors: lies prone   PRECAUTIONS: None  RED FLAGS: None   WEIGHT BEARING RESTRICTIONS: No  FALLS:  Has patient fallen in last 6 months? No  LIVING ENVIRONMENT: Lives with: lives with their spouse Lives in: House/apartment Stairs: 2 outdoor steps  at her home, at daughter's home has 2 flights of steps  Has following equipment at home: None  OCCUPATION: retired  PLOF: Independent  PATIENT GOALS: be able to sleep without pain and be able to walk better  NEXT MD VISIT: 09/26/24  OBJECTIVE:  Note: Objective measures were completed at Evaluation unless otherwise noted.  DIAGNOSTIC FINDINGS: NONE   PATIENT SURVEYS:  Lower Extremity Functional Score: 50 / 80 = 62.5 % Lower Extremity Functional Score: 51 / 80 = 63.7 %  COGNITION: Overall cognitive status: Within functional limits for tasks assessed     SENSATION: WFL  EDEMA:  None noted  MUSCLE LENGTH: Hamstrings: Right wfl deg; Left wfl deg Debby  test: Right wnl deg; Left -5 deg  POSTURE: rounded shoulders and decreased lumbar lordosis  PALPATION: Very tender R gluteals, piriformis, post hip jt   10/09/24: pain R obturator, R piriformis, R glut medius  LOWER EXTREMITY ROM:  AA ROM Right eval Left eval 10/09/24:  R  Hip flexion 100  wnl  Hip extension     Hip abduction 30 45 30 with R groin pain  Hip adduction     Hip internal rotation -10 -10   Hip external rotation 15 25 15  with R groin and R post/lat hip pain  Knee flexion     Knee extension     Ankle dorsiflexion     Ankle plantarflexion     Ankle inversion     Ankle eversion      (Blank rows = not tested)  LOWER EXTREMITY MMT:  MMT Right eval Left eval 10/09/24 R  Hip flexion 4+p!    Hip extension lock bridge  4 5   Hip abduction 4+ 4+ 5  Hip adduction     Hip internal rotation     Hip external rotation 3+ p! wnl 3+ /5 with pain R groin and lat hip  Knee flexion     Knee extension 4p! R lat hip    Ankle dorsiflexion     Ankle plantarflexion     Ankle inversion     Ankle eversion      (Blank rows = not tested)  LOWER EXTREMITY SPECIAL TESTS:  Hip special tests: Belvie (FABER) test: negative, Thomas test: negative, and Hip scouring test: positive   FUNCTIONAL TESTS:  10/09/24: forward step ups on 8 step 10 reps no pain  Improved control and baseline pain per pt with theraband wrap as make shift SI belt with the forward step ups Single leg stance: R 20 sec , L greater than 30, pain R lat hip  GAIT: Distance walked: in clinic Assistive device utilized: None Level of assistance: Complete Independence Comments: no specific antalgia or deficits noted   10/20: mild R laterla pelvic shift noted in R weight bearing.  TREATMENT DATE:  11/01/24 Nustep level 5 x 6 minutes Leg press 20#, tried single legs some discomfort in the  quads On airex 5# straight arm pulls, cues for posture and core activation Feet on ball K2C, rotation, bridge, isometric abs Ball b/n kness squeeze Blue tband clamshells PAssive stretch LE's Manual sheet traction Ionto 80mA dose to the right GT area, did not charge due to her insurance not covering  10/30/24 Nustep level 5 x 6 minutes Leg curls 20# 5# leg ext 2.5# hip abduction right lateral hip pain 2.5# hip extension Feet on ball K2C, rotation, bridge, isometric abs Passive stretch right HS, piriformis and adductors STM with the Tgun to the right ITB and right glutes Manual sheet traction I did apply an ionto patch with 80mA dose with dex to the right GT area, did not charge due to her insurance   10/27/24: Nustep L5x5min -Hamstring curls seated w green band 2x10 -Hip adduction with ball 2x10 -Hip abduction 2x10 green band  -glute bridge 2x10 -Step Ups 2x10 6 -STM R hip   10/18/24:  Nustep level 5 5 min Side lying on L for lumbar extension, rot. SB restriction muscle energy Side lying L for deep cross friction massage lateral and posterior R hip musculature with theragun Kinesiotaping R hip 2 I pieces extending from lateral thigh to mid lumbar to assisted R hip abductors, Er stabilizers.   10/13/24 Nustep level 5 x 5 minutes Leg press 20# 2x10 HS curls 20# 2x10 Feet on ball K2C, rotation, small bridge, isometric abs Red tband clamshells Ball b/n knees squeeze Passive stretch HS, piriformis, adductors and hip flexors STM to the right buttock, ITB and adductor Added to HEP  10/09/24: reassessment:  Instructed in: Seated R gluteal self massage/ with tennis ball  Cat /cow, LTR Kinesiotape R post/ lat hip 2 -I pieces extending from R lateral trochanter to lumbar region and second strip from lateral trochanter to sciatic notch   08/30/24:  Nustep L 5 ,6 min In supine R hip ER provokes R lat hip pain and R groin pain In side lying L, R clam shell also provoked R  groin pain  Manual: supine R hip distraction with thrust x 3, with minimal reduction in R groin pain with provoking movements Prone for deep soft tissue myofascial release, massage R piriformis with theragun Supine for contract relax for alt resisted hip and knee ext 5 sec holds Supine manually assisted B hip and knee flexion for LTR's  Leg press R LE 20#, 20 reps Adapted bridge to unilateral bridge per medbridge HEP below     08/28/24:  Nustep Level 5 x 6 min Supine for mobilization with movement , with R hip ER. 3 bouts of 10 reps each with inf/lat glides R prox femur Prone for deep soft tissue massage R gluteus medius, piriformis Supine for R hip distraction with end range stretch with hip in open pack position  Reviewed home program of bridging and isometric hip abd/ER Added cat cows to home routine  08/23/24:  Evaluation, education in anatomy of hip, spine, surrounding structures Lateral glides of R prox femur, combined with R hip ER, mob with movement, with improvements in pain free ROM R hip flex and ER Instructed in the ex as described below    PATIENT EDUCATION:  Education details: POC, goals Person educated: Patient Education method: Explanation, Demonstration, Tactile cues, Verbal cues, and Handouts Education comprehension: verbalized understanding  HOME EXERCISE PROGRAM: Access Code: [email or web address] URL: [email or web address] Date: 08/30/2024 Prepared  by: Greig Credit  Exercises - Hooklying Isometric Hip Abduction with Belt  - 1 x daily - 7 x weekly - 10 reps - 5 hold - Single Leg Bridge  - 1 x daily - 7 x weekly - 3 sets - 10 reps Access Code: [email or web address] URL: [email or web address] Date: 08/23/2024 Prepared by: Greig Credit  Exercises - Bridge on BOSU  - 1 x daily - 3 x weekly - 3 sets - 10 reps - Hooklying Isometric Hip Abduction with Belt  - 1 x daily - 7 x weekly - 10 reps - 5 hold Access Code: [email or web address] URL:  [email or web address] Date: 10/13/2024 Prepared by: Ozell Mainland  Exercises - Supine Piriformis Stretch Pulling Heel to Hip  - 2 x daily - 7 x weekly - 1 sets - 5 reps - 30 hold - Supine Hip Adductor Stretch  - 2 x daily - 7 x weekly - 1 sets - 5 reps - 30 hold ASSESSMENT:  CLINICAL IMPRESSION: 11/01/24  Patient overall reports less tenderness, really only has pain with going up steps.   I continued with some manual sheet traction to see if this would help the groin pain as she felt like it did, I did ionto but did not charge due to her insurance not covering, I feel like this would be a good thing to try to allow MD to make a better decision on future treatment  10/09/24 The patient returned today for additional assessment of R hip / L4/5 radiculopathy pain  following consult with orthopedist.  R hip ER, abd, very painful radiating to groin and lateral/ post hip.  Also associated weakness R hip ER especially.  Changed our treatment approach, to address soft tissue adhesions in gluteals and R hip external rotators, also added stability with trial with kinesiotape.  Also address possible lumbar involvement with cat cow and lower trunk rotation.  May need progression with deep stabilization ex R hip.        Eval: Patient is a 73 y.o. female  who was evaluated today by physical therapy  for R hip pain. She presents with tenderness R lateral, posterior hip musculature, weakness R hip for ER , extension, and flexion, pain with scour test R hip, some loss of ROM R hip as compared to L particularly for flexion and ER.  Her lumbar ROM is wfl.  She responded positively to R hip jt/ capsular mobilization today, we also initiated isometric ex for her hip musculature. She should benefit from skilled PT to address her deficits and improve her Sx.  She returns to her PCP in 4 weeks, and if she continues with pain after 2-3 weeks of PT might contact her MD about possible x ray.    OBJECTIVE  IMPAIRMENTS: decreased activity tolerance, decreased balance, decreased endurance, decreased knowledge of condition, difficulty walking, decreased ROM, decreased strength, impaired flexibility, postural dysfunction, and pain.   ACTIVITY LIMITATIONS: carrying, standing, sleeping, stairs, locomotion level, and caring for others  PARTICIPATION LIMITATIONS: meal prep, shopping, community activity, and yard work  PERSONAL FACTORS: Age, Behavior pattern, Time since onset of injury/illness/exacerbation, and 1-2 comorbidities: osteopenia,aortic stenosis  are also affecting patient's functional outcome.   REHAB POTENTIAL: Good  CLINICAL DECISION MAKING: Evolving/moderate complexity  EVALUATION COMPLEXITY: Moderate   GOALS: Goals reviewed with patient? Yes  SHORT TERM GOALS: Target date: 2 weeks, 09/06/24 I HEP Baseline:initiated today Goal status: Imet 10/13/24   LONG TERM GOALS: Target date:extended another 8 weeks,  12/04/24  Pt able to climb 8  steps or complete 8 forward step ups with R LE on 8 step , without pain R hip Baseline:  Goal status: INITIAL 10/09/24 some pain not met yet; Still with pain 10/27/24  2.  Single leg stance R Le greater than 30 sec, without pain R hip Baseline: p! 20 sec Goal status: INITIAL 10/09/24; not assessed today; RLE 30s LLE 13s 10/27/24, progressing 11/01/24  3.  Strength R hip all planes improve to 4+ to 5/5 Baseline: see above chart  Goal status: progress, see chart  4.  ROM R hip improve to normal Faber's position, normal flexion, Er  Baseline:  Goal status: 10/09/24: provocative, ongoing 11/01/24    PLAN:  PT FREQUENCY: 2x/week  PT DURATION: 8 weeks  PLANNED INTERVENTIONS: 97110-Therapeutic exercises, 97530- Therapeutic activity, 97112- Neuromuscular re-education, 97535- Self Care, 02859- Manual therapy, [phone]- Ionotophoresis 4mg /ml Dexamethasone, and Joint mobilization  PLAN FOR NEXT SESSION: recommend manual techniques, deep tissue  massage R post/ lat hip musculature, R hip mobilization, progress / reassess therex for R hip, emphasis on stabilization, also addreess lumbar flexibility, see how today's treatment went   OBADIAH OZELL ORN, PT 11/01/2024, 9:27 AM  [facility] at [facility] [place], [place], [address] Phone: [phone]   Fax:  [phone]

## 2024-11-06 ENCOUNTER — Ambulatory Visit

## 2024-11-06 ENCOUNTER — Other Ambulatory Visit: Payer: Self-pay | Admitting: Family Medicine

## 2024-11-06 DIAGNOSIS — M25551 Pain in right hip: Secondary | ICD-10-CM

## 2024-11-06 DIAGNOSIS — R262 Difficulty in walking, not elsewhere classified: Secondary | ICD-10-CM

## 2024-11-06 NOTE — Therapy (Signed)
 OUTPATIENT PHYSICAL THERAPY LOWER EXTREMITY  Progress Note Reporting Period 08/23/24 to 11/06/24  See note below for Objective Data and Assessment of Progress/Goals.      Patient Name: Dana Bryan MRN: [id number] DOB:1951/09/14, 73 y.o., female Today's Date: 11/06/2024  END OF SESSION:  PT End of Session - 11/06/24 1402     Visit Number 10    Date for Recertification  12/04/24    PT Start Time 1355    PT Stop Time 1440    PT Time Calculation (min) 45 min    Activity Tolerance Patient tolerated treatment well    Behavior During Therapy WFL for tasks assessed/performed           Past Medical History:  Diagnosis Date   Anxiety    on meds   Basal cell carcinoma    Bicuspid aortic valve    Cancer (HCC)    Cataract    no sx    Generalized headaches    Heart murmur    per Dr.Tabori   Hyperlipidemia    on meds   Pneumonia    Reactive depression (situational)    on meds   Squamous cell skin cancer, finger    Vasomotor rhinitis    Past Surgical History:  Procedure Laterality Date   BUBBLE STUDY  09/04/2022   Procedure: BUBBLE STUDY;  Surgeon: Barbaraann Darryle Ned, MD;  Location: [facility] ENDOSCOPY;  Service: Cardiovascular;;   COLONOSCOPY  2008   normal    COLONOSCOPY W/ POLYPECTOMY  2021   [facility] at [facility], TA and SSPs, 3 yr recall   COLONOSCOPY WITH PROPOFOL   11/30/2023   [facility]bruster   TEE WITHOUT CARDIOVERSION N/A 09/04/2022   Procedure: TRANSESOPHAGEAL ECHOCARDIOGRAM (TEE);  Surgeon: Barbaraann Darryle Ned, MD;  Location: [facility] ENDOSCOPY;  Service: Cardiovascular;  Laterality: N/A;   TONSILLECTOMY  1973   Patient Active Problem List   Diagnosis Date Noted   Electrolyte abnormality 11/10/2022   Aortic stenosis 07/23/2022   LBBB (left bundle branch block) 06/14/2022   Lichen sclerosus 01/07/2022   Osteopenia 09/09/2021   Heart murmur 01/23/2020   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   Routine health maintenance 08/06/2011   B12 deficiency 07/18/2008    Hyperlipidemia 02/15/2008   Anxiety and depression 02/15/2008    PCP: Mahlon Crank, MD  REFERRING PROVIDER: same  REFERRING DIAG: R hip pain  THERAPY DIAG:  Pain in right hip  Difficulty in walking, not elsewhere classified  Rationale for Evaluation and Treatment: Rehabilitation  ONSET DATE:  6 months , March 2025   SUBJECTIVE STATEMENT: Pt reported minimal pain today upon arrival of PT today. Stated she has been doing her HEP independently and it seems to be helping.    10/18/24  responded well to the kinesiotaping and to the deep tissue massage.  Still pain with stair climbing, points to TFL as site of pain Eval:Reports 6 month h/o R hip pain.  Did take the steroid dose pack prescribed by MD, no improvement.  Pain the most prevalent with climbing steps, and with attempting to sleep, not painful or stiff upon waking  PERTINENT HISTORY: No known trauma, referred to PT from PCP PAIN:  Are you having pain? Yes: NPRS scale: 1 to 8  Pain location: primarily R lat hip and ant thigh, knee, sometimes medial thigh into groin  Pain description: deep pain, ache Aggravating factors: lying down, turning over, stair climbing Relieving factors: lies prone   PRECAUTIONS: None  RED FLAGS: None   WEIGHT BEARING RESTRICTIONS: No  FALLS:  Has patient fallen in last 6 months? No  LIVING ENVIRONMENT: Lives with: lives with their spouse Lives in: House/apartment Stairs: 2 outdoor steps at her home, at daughter's home has 2 flights of steps  Has following equipment at home: None  OCCUPATION: retired  PLOF: Independent  PATIENT GOALS: be able to sleep without pain and be able to walk better  NEXT MD VISIT: 09/26/24  OBJECTIVE:  Note: Objective measures were completed at Evaluation unless otherwise noted.  DIAGNOSTIC FINDINGS: NONE   PATIENT SURVEYS:  Lower Extremity Functional Score: 50 / 80 = 62.5 % Lower Extremity Functional Score: 51 / 80 = 63.7  %  COGNITION: Overall cognitive status: Within functional limits for tasks assessed     SENSATION: WFL  EDEMA:  None noted  MUSCLE LENGTH: Hamstrings: Right wfl deg; Left wfl deg [name]: Right wnl deg; Left -5 deg  POSTURE: rounded shoulders and decreased lumbar lordosis  PALPATION: Very tender R gluteals, piriformis, post hip jt   10/09/24: pain R obturator, R piriformis, R glut medius  LOWER EXTREMITY ROM:  AA ROM Right eval Left eval 10/09/24:  R  Hip flexion 100  wnl  Hip extension     Hip abduction 30 45 30 with R groin pain  Hip adduction     Hip internal rotation -10 -10   Hip external rotation 15 25 15  with R groin and R post/lat hip pain  Knee flexion     Knee extension     Ankle dorsiflexion     Ankle plantarflexion     Ankle inversion     Ankle eversion      (Blank rows = not tested)  LOWER EXTREMITY MMT:  MMT Right eval Left eval 10/09/24 R  Hip flexion 4+p!    Hip extension lock bridge  4 5   Hip abduction 4+ 4+ 5  Hip adduction     Hip internal rotation     Hip external rotation 3+ p! wnl 3+ /5 with pain R groin and lat hip  Knee flexion     Knee extension 4p! R lat hip    Ankle dorsiflexion     Ankle plantarflexion     Ankle inversion     Ankle eversion      (Blank rows = not tested)  LOWER EXTREMITY SPECIAL TESTS:  Hip special tests: Belvie (FABER) test: negative, Thomas test: negative, and Hip scouring test: positive   FUNCTIONAL TESTS:  10/09/24: forward step ups on 8 step 10 reps no pain  Improved control and baseline pain per pt with theraband wrap as make shift SI belt with the forward step ups Single leg stance: R 20 sec , L greater than 30, pain R lat hip  GAIT: Distance walked: in clinic Assistive device utilized: None Level of assistance: Complete Independence Comments: no specific antalgia or deficits noted   10/20: mild R laterla pelvic shift noted in R weight bearing.  TREATMENT DATE:  11/06/24 -Reassessment of goals -NuStep L5x39min -Red resistance band Clam Shells  -Glute Bridges -Step Ups 6 -Resisted gait 20# (lateral walking)  -Iontophoresis patch placement #3- not billed  11/01/24 Nustep level 5 x 6 minutes Leg press 20#, tried single legs some discomfort in the quads On airex 5# straight arm pulls, cues for posture and core activation Feet on ball K2C, rotation, bridge, isometric abs Ball b/n kness squeeze Blue tband clamshells PAssive stretch LE's Manual sheet traction Ionto 80mA dose to the right GT area, did not charge due to her insurance not covering  10/30/24 Nustep level 5 x 6 minutes Leg curls 20# 5# leg ext 2.5# hip abduction right lateral hip pain 2.5# hip extension Feet on ball K2C, rotation, bridge, isometric abs Passive stretch right HS, piriformis and adductors STM with the Tgun to the right ITB and right glutes Manual sheet traction I did apply an ionto patch with 80mA dose with dex to the right GT area, did not charge due to her insurance   10/27/24: Nustep L5x5min -Hamstring curls seated w green band 2x10 -Hip adduction with ball 2x10 -Hip abduction 2x10 green band  -glute bridge 2x10 -Step Ups 2x10 6 -STM R hip   10/18/24:  Nustep level 5 5 min Side lying on L for lumbar extension, rot. SB restriction muscle energy Side lying L for deep cross friction massage lateral and posterior R hip musculature with theragun Kinesiotaping R hip 2 I pieces extending from lateral thigh to mid lumbar to assisted R hip abductors, Er stabilizers.   10/13/24 Nustep level 5 x 5 minutes Leg press 20# 2x10 HS curls 20# 2x10 Feet on ball K2C, rotation, small bridge, isometric abs Red tband clamshells Ball b/n knees squeeze Passive stretch HS, piriformis, adductors and hip flexors STM to the right buttock, ITB and adductor Added to  HEP  10/09/24: reassessment:  Instructed in: Seated R gluteal self massage/ with tennis ball  Cat /cow, LTR Kinesiotape R post/ lat hip 2 -I pieces extending from R lateral trochanter to lumbar region and second strip from lateral trochanter to sciatic notch   08/30/24:  Nustep L 5 ,6 min In supine R hip ER provokes R lat hip pain and R groin pain In side lying L, R clam shell also provoked R groin pain  Manual: supine R hip distraction with thrust x 3, with minimal reduction in R groin pain with provoking movements Prone for deep soft tissue myofascial release, massage R piriformis with theragun Supine for contract relax for alt resisted hip and knee ext 5 sec holds Supine manually assisted B hip and knee flexion for LTR's  Leg press R LE 20#, 20 reps Adapted bridge to unilateral bridge per medbridge HEP below     08/28/24:  Nustep Level 5 x 6 min Supine for mobilization with movement , with R hip ER. 3 bouts of 10 reps each with inf/lat glides R prox femur Prone for deep soft tissue massage R gluteus medius, piriformis Supine for R hip distraction with end range stretch with hip in open pack position  Reviewed home program of bridging and isometric hip abd/ER Added cat cows to home routine  08/23/24:  Evaluation, education in anatomy of hip, spine, surrounding structures Lateral glides of R prox femur, combined with R hip ER, mob with movement, with improvements in pain free ROM R hip flex and ER Instructed in the ex as described below    PATIENT EDUCATION:  Education details: POC, goals  Person educated: Patient Education method: Explanation, Demonstration, Tactile cues, Verbal cues, and Handouts Education comprehension: verbalized understanding  HOME EXERCISE PROGRAM: Access Code: [email or web address] URL: [email or web address] Date: 08/30/2024 Prepared by: Greig Speaks  Exercises - Hooklying Isometric Hip Abduction with Belt  - 1 x daily - 7 x weekly - 10 reps - 5  hold - Single Leg Bridge  - 1 x daily - 7 x weekly - 3 sets - 10 reps Access Code: [email or web address] URL: [email or web address] Date: 08/23/2024 Prepared by: Greig Credit  Exercises - Bridge on BOSU  - 1 x daily - 3 x weekly - 3 sets - 10 reps - Hooklying Isometric Hip Abduction with Belt  - 1 x daily - 7 x weekly - 10 reps - 5 hold Access Code: [email or web address] URL: [email or web address] Date: 10/13/2024 Prepared by: Ozell Mainland  Exercises - Supine Piriformis Stretch Pulling Heel to Hip  - 2 x daily - 7 x weekly - 1 sets - 5 reps - 30 hold - Supine Hip Adductor Stretch  - 2 x daily - 7 x weekly - 1 sets - 5 reps - 30 hold ASSESSMENT:  CLINICAL IMPRESSION:  11/06/24: Pt still exhibits pain with prolonged activity; decrease activity endurance when training the R hip. Pt with pain in motions of hip flexion, abduction, and external rotation. Pt education on Iontophoresis and patch placed today. Pt participated in R hip exercises just required rest periods in between activity.   11/01/24  Patient overall reports less tenderness, really only has pain with going up steps.   I continued with some manual sheet traction to see if this would help the groin pain as she felt like it did, I did ionto but did not charge due to her insurance not covering, I feel like this would be a good thing to try to allow MD to make a better decision on future treatment  10/09/24 The patient returned today for additional assessment of R hip / L4/5 radiculopathy pain  following consult with orthopedist.  R hip ER, abd, very painful radiating to groin and lateral/ post hip.  Also associated weakness R hip ER especially.  Changed our treatment approach, to address soft tissue adhesions in gluteals and R hip external rotators, also added stability with trial with kinesiotape.  Also address possible lumbar involvement with cat cow and lower trunk rotation.  May need progression with deep stabilization ex R  hip.        Eval: Patient is a 73 y.o. female  who was evaluated today by physical therapy  for R hip pain. She presents with tenderness R lateral, posterior hip musculature, weakness R hip for ER , extension, and flexion, pain with scour test R hip, some loss of ROM R hip as compared to L particularly for flexion and ER.  Her lumbar ROM is wfl.  She responded positively to R hip jt/ capsular mobilization today, we also initiated isometric ex for her hip musculature. She should benefit from skilled PT to address her deficits and improve her Sx.  She returns to her PCP in 4 weeks, and if she continues with pain after 2-3 weeks of PT might contact her MD about possible x ray.    OBJECTIVE IMPAIRMENTS: decreased activity tolerance, decreased balance, decreased endurance, decreased knowledge of condition, difficulty walking, decreased ROM, decreased strength, impaired flexibility, postural dysfunction, and pain.   ACTIVITY LIMITATIONS: carrying, standing, sleeping, stairs, locomotion level, and caring for others  PARTICIPATION LIMITATIONS: meal prep, shopping, community activity, and yard work  PERSONAL FACTORS: Age, Behavior pattern, Time since onset of injury/illness/exacerbation, and 1-2 comorbidities: osteopenia,aortic stenosis  are also affecting patient's functional outcome.   REHAB POTENTIAL: Good  CLINICAL DECISION MAKING: Evolving/moderate complexity  EVALUATION COMPLEXITY: Moderate   GOALS: Goals reviewed with patient? Yes  SHORT TERM GOALS: Target date: 2 weeks, 09/06/24 I HEP Baseline:initiated today Goal status: Met 10/13/24   LONG TERM GOALS: Target date:extended another 8 weeks,  12/04/24  Pt able to climb 8 steps or complete 8 forward step ups with R LE on 8 step , without pain R hip Baseline:  Goal status: INITIAL 10/09/24 some pain not met yet; Still with pain 10/27/24, IN PROGRESS able to do with some pain still 11/06/24  2.  Single leg stance R Le greater than 30 sec,  without pain R hip Baseline: p! 20 sec Goal status: INITIAL 10/09/24; not assessed today; RLE 30s LLE 13s 10/27/24, progressing 11/01/24; RLE 16s LLE 30s+ 11/06/24  3.  Strength R hip all planes improve to 4+ to 5/5 Baseline: see above chart  Goal status: progress, see chart; IN PROGRESS add/abd 5/5 11/06/24  4.  ROM R hip improve to normal Faber's position, normal flexion, Er  Baseline:  Goal status: 10/09/24: provocative, ongoing 11/01/24; Still provocative 11/06/24    PLAN:  PT FREQUENCY: 2x/week  PT DURATION: 8 weeks  PLANNED INTERVENTIONS: 97110-Therapeutic exercises, 97530- Therapeutic activity, 97112- Neuromuscular re-education, 97535- Self Care, 02859- Manual therapy, 97033- Ionotophoresis 4mg /ml Dexamethasone, and Joint mobilization  PLAN FOR NEXT SESSION: recommend manual techniques, deep tissue massage R post/ lat hip musculature, R hip mobilization, progress / reassess therex for R hip, emphasis on stabilization, also addreess lumbar flexibility, see how today's treatment went   Thersia Alder, Student-PT 11/06/2024, 2:48 PM  [facility] at [facility] [address] [facility]. [place], [place], [address] Phone: [phone]   Fax:  [phone]

## 2024-11-08 ENCOUNTER — Other Ambulatory Visit: Payer: Self-pay | Admitting: Family Medicine

## 2024-11-13 DIAGNOSIS — M51361 Other intervertebral disc degeneration, lumbar region with lower extremity pain only: Secondary | ICD-10-CM | POA: Diagnosis not present

## 2024-11-22 ENCOUNTER — Other Ambulatory Visit: Payer: Self-pay

## 2024-11-22 ENCOUNTER — Ambulatory Visit: Payer: Self-pay

## 2024-11-22 ENCOUNTER — Ambulatory Visit
Admission: RE | Admit: 2024-11-22 | Discharge: 2024-11-22 | Disposition: A | Discharge status: Home / Self Care | Attending: Nurse Practitioner

## 2024-11-22 VITALS — BP 158/82 | HR 77 | Temp 97.9°F | Resp 18 | Ht 63.0 in | Wt 135.0 lb

## 2024-11-22 DIAGNOSIS — R051 Acute cough: Secondary | ICD-10-CM | POA: Diagnosis not present

## 2024-11-22 DIAGNOSIS — J069 Acute upper respiratory infection, unspecified: Secondary | ICD-10-CM

## 2024-11-22 LAB — POC COVID19/FLU A&B COMBO
Covid Antigen, POC: NEGATIVE
Influenza A Antigen, POC: NEGATIVE
Influenza B Antigen, POC: NEGATIVE

## 2024-11-22 MED ORDER — FLUTICASONE PROPIONATE 50 MCG/ACT NA SUSP: 2.0000 | Freq: Every day | NASAL | 0 refills | Status: AC

## 2024-11-22 MED ORDER — BENZONATATE 200 MG PO CAPS: 200.0000 mg | ORAL_CAPSULE | Freq: Three times a day (TID) | ORAL | 0 refills | Status: AC | PRN

## 2024-11-22 MED ORDER — IPRATROPIUM-ALBUTEROL 0.5-2.5 (3) MG/3ML IN SOLN
3.0000 mL | Freq: Once | RESPIRATORY_TRACT | Status: AC
  Administered 2024-11-22: 3 mL via RESPIRATORY_TRACT

## 2024-11-22 MED ORDER — CETIRIZINE-PSEUDOEPHEDRINE ER 5-120 MG PO TB12: 1.0000 | ORAL_TABLET | Freq: Every day | ORAL | 0 refills | Status: AC

## 2024-11-22 MED ORDER — MUCINEX DM MAXIMUM STRENGTH 60-1200 MG PO TB12: 1.0000 | ORAL_TABLET | Freq: Two times a day (BID) | ORAL | 0 refills | Status: AC

## 2024-11-22 NOTE — Discharge Instructions (Addendum)
 You were seen today for symptoms of a viral upper respiratory infection, including nasal congestion, chest heaviness, cough, shortness of breath, headache, fatigue, body aches, and blood-tinged mucus. Your flu and COVID tests were negative, and your lung exam was normal. The small amount of blood seen in your mucus is most consistent with nasal irritation from dry indoor air and frequent or forceful nose blowing rather than a lung infection or bleeding from the chest. You were given a nebulizer treatment to ease chest tightness and help mobilize secretions. Medications were prescribed to manage symptoms, including cetirizine -pseudoephedrine once daily for congestion, Flonase  nasal spray daily to reduce nasal inflammation, Mucinex  DM twice daily to thin mucus and reduce cough, and benzonatate  as needed for cough suppression. Do not take Claritin  while taking the cetirizine -pseudoephedrine; you may resume use of Claritin  after finishing the prescribed medication.   At home, continue supportive care by drinking plenty of fluids, resting, and using a humidifier at night to help keep your nasal passages moist. You may apply a small amount of petroleum jelly gently inside the nostrils with a cotton swab to reduce dryness and bleeding. Warm showers or steam can help loosen congestion. Use acetaminophen  or ibuprofen  as directed for headache, body aches, or discomfort if needed. Replace or sanitize your toothbrush once you are starting to feel better to avoid re-exposure to germs.  Follow up with your primary care provider if symptoms are not improving over the next several days, worsen, or persist beyond what is expected for a viral illness, or if you continue to have shortness of breath or recurrent nasal bleeding. Referral to a specialist such as ENT or pulmonology may be considered if symptoms become prolonged or recurrent. Seek emergency care immediately if you develop persistent or increasing amounts of blood in  your sputum, high fever, worsening or severe shortness of breath, chest pain, new wheezing, dizziness or fainting, confusion, or any sudden or concerning change in your symptoms.

## 2024-11-22 NOTE — Telephone Encounter (Signed)
 FYI Only or Action Required?: FYI only for provider: Patient going to go to Urgent Care today.  Patient was last seen in primary care on 09/26/2024 by Mahlon Comer BRAVO, MD.  Called Nurse Triage reporting No chief complaint on file..  Symptoms began several days ago.  Interventions attempted: Rest, hydration, or home remedies.  Symptoms are: gradually worsening.  Triage Disposition: See HCP Within 4 Hours (Or PCP Triage)  Patient/caregiver understands and will follow disposition?: [facility]             Copied from CRM [id number]. Topic: Clinical - Red Word Triage >> Nov 22, 2024 10:34 AM Diannia H wrote: [facility] that prompted transfer to Nurse Triage: Patient nose is running and blood is coming out when she blows her nose. Mild headache today. Reason for Disposition  [1] MILD difficulty breathing (e.g., minimal/no SOB at rest, SOB with walking, pulse < 100) AND [2] still present when not coughing  Answer Assessment - Initial Assessment Questions Felt a cold coming on 3 days ago Yesterday mild headache--no headache right now Woke up at 3am this morning and had to keep blowing her nose---some blood in there mixed with phlegm Dry cough, Sore throat Patient states burning feeling in her chest when she breathes in and states that she had pneumonia in the past but does not have pain when she takes a deep breath  Patient denies any known fevers, nausea, vomiting, diarrhea.  Generalized malaise for the past two days   No openings available today at patient's PCP office or the surrounding offices within in the region. Patient is advised Urgent Care and states she will go to one to be seen today.  Patient is advised to call us  back if anything changes or with any further questions/concerns. Patient is advised that if anything worsens to go to the Emergency Room. Patient verbalized understanding.  Protocols used: Cough - Acute Non-Productive-A-AH

## 2024-11-22 NOTE — ED Provider Notes (Signed)
 [facility]    CSN: [id number] Arrival date & time: 11/22/24  1353      History   Chief Complaint Chief Complaint  Patient presents with   Nasal Congestion    HPI Dana Bryan is a 73 y.o. female.   Discussed the use of AI scribe software for clinical note transcription with the patient, who gave verbal consent to proceed.   The patient presents with a three-day history of progressively worsening upper and lower respiratory symptoms, including nasal congestion, chest heaviness and discomfort, cough, shortness of breath, headaches, low energy, and blood-tinged mucus. The cough is described as non-productive. The patient reports associated body aches and generalized weakness but denies fever, chills, nausea, vomiting, or diarrhea. She endorses recent exposure to ill family members, noting that she was around her grandchildren during the Thanksgiving holiday who had cough and rhinorrhea.  For symptom management, the patient took one dose of Advil  the previous night due to feeling unwell but has not tried any other treatments.  The following sections of the patient's history were reviewed and updated as appropriate: allergies, current medications, past family history, past medical history, past social history, past surgical history, and problem list.          Past Medical History:  Diagnosis Date   Anxiety    on meds   Basal cell carcinoma    Bicuspid aortic valve    Cancer (HCC)    Cataract    no sx    Generalized headaches    Heart murmur    per Dr.Tabori   Hyperlipidemia    on meds   Pneumonia    Reactive depression (situational)    on meds   Squamous cell skin cancer, finger    Vasomotor rhinitis     Patient Active Problem List   Diagnosis Date Noted   Electrolyte abnormality 11/10/2022   Aortic stenosis 07/23/2022   LBBB (left bundle branch block) 06/14/2022   Lichen sclerosus 01/07/2022   Osteopenia 09/09/2021   Heart murmur  01/23/2020   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   Routine health maintenance 08/06/2011   B12 deficiency 07/18/2008   Hyperlipidemia 02/15/2008   Anxiety and depression 02/15/2008    Past Surgical History:  Procedure Laterality Date   BUBBLE STUDY  09/04/2022   Procedure: BUBBLE STUDY;  Surgeon: Barbaraann Darryle Ned, MD;  Location: [facility] ENDOSCOPY;  Service: Cardiovascular;;   COLONOSCOPY  2008   normal    COLONOSCOPY W/ POLYPECTOMY  2021   [facility] at [facility], TA and SSPs, 3 yr recall   COLONOSCOPY WITH PROPOFOL   11/30/2023   [facility]uster   TEE WITHOUT CARDIOVERSION N/A 09/04/2022   Procedure: TRANSESOPHAGEAL ECHOCARDIOGRAM (TEE);  Surgeon: Barbaraann Darryle Ned, MD;  Location: [facility] ENDOSCOPY;  Service: Cardiovascular;  Laterality: N/A;   TONSILLECTOMY  1973    OB History   No obstetric history on file.      Home Medications    Prior to Admission medications   Medication Sig Start Date End Date Taking? Authorizing Provider  benzonatate  (TESSALON ) 200 MG capsule Take 1 capsule (200 mg total) by mouth 3 (three) times daily as needed for cough. 11/22/24  Yes Iola Lukes, FNP  cetirizine -pseudoephedrine  (ZYRTEC -D) 5-120 MG tablet Take 1 tablet by mouth daily with breakfast for 10 days. 11/22/24 12/02/24 Yes Iola Lukes, FNP  Dextromethorphan-guaiFENesin  (MUCINEX  DM MAXIMUM STRENGTH) 60-1200 MG TB12 Take 1 tablet by mouth 2 (two) times daily. 11/22/24  Yes Iola Lukes, FNP  fluticasone  (FLONASE ) 50 MCG/ACT nasal spray  Place 2 sprays into both nostrils daily. Shake well before use. Gently blow nose before spraying. Do not blow nose immediately after use. You should not taste the medication or feel it going down your throat; if you do, adjust your technique. 11/22/24  Yes Iola Lukes, FNP  ALPRAZolam  (XANAX ) 0.25 MG tablet Take 1 tablet (0.25 mg total) by mouth daily as needed. 01/12/24   Tabori, Katherine E, MD  Ascorbic Acid (VITAMIN C PO) Take 500 mg by mouth daily.     [provider]  atorvastatin  (LIPITOR) 20 MG tablet TAKE 1 TABLET BY MOUTH EVERY DAY 11/06/24   Tabori, Katherine E, MD  buPROPion  (WELLBUTRIN  XL) 300 MG 24 hr tablet TAKE 1 TABLET BY MOUTH EVERY DAY 06/16/24   Tabori, Katherine E, MD  busPIRone  (BUSPAR ) 7.5 MG tablet TAKE 1 TABLET BY MOUTH TWICE A DAY 11/08/24   Tabori, Katherine E, MD  clobetasol cream (TEMOVATE) 0.05 % Apply 1 application  topically 2 (two) times daily as needed (irritation).    [provider]  ibuprofen  (ADVIL ,MOTRIN ) 200 MG tablet Take 400 mg by mouth daily as needed for headache.    [provider]  Multiple Vitamins-Minerals (CENTRUM SILVER WOMEN 50+ PO) Take 1 tablet by mouth.    [provider]  Vitamin D , Ergocalciferol , (DRISDOL ) 1.25 MG (50000 UNIT) CAPS capsule Take 1 capsule (50,000 Units total) by mouth every 7 (seven) days. Take in addition to 2,000 units daily OTC Vitamin D  09/27/24   Tabori, Katherine E, MD  vitamin D3 (CHOLECALCIFEROL) 25 MCG tablet Take by mouth daily.    [provider]    Family History Family History  Problem Relation Age of Onset   Heart attack Mother    Hypertension Mother    Hypertension Father    Alcohol abuse Father    Colon polyps Neg Hx    Colon cancer Neg Hx    Esophageal cancer Neg Hx    Stomach cancer Neg Hx    Rectal cancer Neg Hx    Cancer - Colon Neg Hx    Breast cancer Neg Hx     Social History Social History   Tobacco Use   Smoking status: Former    Current packs/day: 0.00    Types: Cigarettes    Start date: 01/21/1974    Quit date: 01/22/2004    Years since quitting: 20.8   Smokeless tobacco: Never  Vaping Use   Vaping status: Never Used  Substance Use Topics   Alcohol use: Yes    Alcohol/week: 15.0 standard drinks of alcohol    Types: 15 Standard drinks or equivalent per week    Comment: drinks 1-2 drinks daily   Drug use: Never     Allergies   Chlorhexidine gluconate   Review of Systems Review of  Systems  Constitutional:  Positive for fatigue. Negative for chills and fever.  HENT:  Positive for congestion, rhinorrhea and sneezing. Negative for sore throat and voice change.   Respiratory:  Positive for cough, chest tightness and shortness of breath.   Gastrointestinal:  Negative for diarrhea, nausea and vomiting.  Musculoskeletal:  Positive for myalgias.  Neurological:  Positive for weakness (generalized) and headaches.  All other systems reviewed and are negative.    Physical Exam Triage Vital Signs ED Triage Vitals  Encounter Vitals Group     BP 11/22/24 1455 (!) 158/82     Girls Systolic BP Percentile --      Girls Diastolic BP Percentile --  Boys Systolic BP Percentile --      Boys Diastolic BP Percentile --      Pulse Rate 11/22/24 1455 77     Resp 11/22/24 1455 18     Temp 11/22/24 1455 97.9 F (36.6 C)     Temp Source 11/22/24 1455 Oral     SpO2 11/22/24 1455 97 %     Weight 11/22/24 1457 135 lb (61.2 kg)     Height 11/22/24 1457 5' 3 (1.6 m)     Head Circumference --      Peak Flow --      Pain Score 11/22/24 1456 9     Pain Loc --      Pain Education --      Exclude from Growth Chart --    No data found.  Updated Vital Signs BP (!) 158/82 (BP Location: Right Arm)   Pulse 77   Temp 97.9 F (36.6 C) (Oral)   Resp 18   Ht 5' 3 (1.6 m)   Wt 135 lb (61.2 kg)   SpO2 97%   BMI 23.91 kg/m   Visual Acuity Right Eye Distance:   Left Eye Distance:   Bilateral Distance:    Right Eye Near:   Left Eye Near:    Bilateral Near:     Physical Exam Vitals reviewed.  Constitutional:      General: She is awake. She is not in acute distress.    Appearance: Normal appearance. She is well-developed. She is not ill-appearing, toxic-appearing or diaphoretic.  HENT:     Head: Normocephalic.     Right Ear: Tympanic membrane, ear canal and external ear normal. No drainage, swelling or tenderness. No middle ear effusion. Tympanic membrane is not erythematous.      Left Ear: Tympanic membrane, ear canal and external ear normal. No drainage, swelling or tenderness.  No middle ear effusion. Tympanic membrane is not erythematous.     Nose: Congestion present. No rhinorrhea.     Mouth/Throat:     Lips: Pink.     Mouth: Mucous membranes are moist.     Pharynx: No pharyngeal swelling, oropharyngeal exudate, posterior oropharyngeal erythema or uvula swelling.     Tonsils: No tonsillar exudate or tonsillar abscesses.  Eyes:     General: Vision grossly intact.     Conjunctiva/sclera: Conjunctivae normal.  Cardiovascular:     Rate and Rhythm: Normal rate.     Heart sounds: Normal heart sounds.  Pulmonary:     Effort: Pulmonary effort is normal. No tachypnea or respiratory distress.     Breath sounds: Normal breath sounds and air entry.     Comments: Respirations even and unlabored  Musculoskeletal:        General: Normal range of motion.     Cervical back: Normal range of motion and neck supple.  Lymphadenopathy:     Cervical: No cervical adenopathy.  Skin:    General: Skin is warm and dry.  Neurological:     General: No focal deficit present.     Mental Status: She is alert and oriented to person, place, and time.  Psychiatric:        Behavior: Behavior is cooperative.      UC Treatments / Results  Labs (all labs ordered are listed, but only abnormal results are displayed) Labs Reviewed  POC COVID19/FLU A&B COMBO - Normal    EKG   Radiology No results found.  Procedures Procedures (including critical care time)  Medications Ordered in UC  Medications  ipratropium-albuterol  (DUONEB) 0.5-2.5 (3) MG/3ML nebulizer solution 3 mL (3 mLs Nebulization Given 11/22/24 1555)    Initial Impression / Assessment and Plan / UC Course  I have reviewed the triage vital signs and the nursing notes.  Pertinent labs & imaging results that were available during my care of the patient were reviewed by me and considered in my medical decision making  (see chart for details).     Patient presenting with a three-day history of nasal congestion, chest heaviness, nonproductive cough, shortness of breath, headaches, low energy, blood-tinged mucus, body aches, and generalized weakness without reported fever or gastrointestinal symptoms. Recent exposure to ill grandchildren was noted. Physical examination revealed clear lung fields without focal findings, and clinical presentation is consistent with an upper respiratory tract infection. The blood-streaked mucus is most consistent with local nasal irritation related to dry air exposure from home gas heating and forceful nose blowing with associated epistaxis rather than infectious hemoptysis. Flu and COVID tests was negative.  A nebulizer treatment was administered to relieve chest tightness and support secretion mobilization. Symptomatic treatment was initiated with daily cetirizine -pseudoephedrine and fluticasone  nasal spray, along with guaifenesin -dextromethorphan twice daily and benzonatate  as needed for cough control. Supportive care measures were reviewed including increased fluid intake, rest, nighttime humidifier use, moisturizing the nasal mucosa with a light application of petroleum jelly using a cotton swab, and replacing or sanitizing the toothbrush once symptoms improve.  The patient was advised to follow up with primary care if symptoms fail to improve, worsen, or persist beyond expected viral recovery. Emergency evaluation was recommended for development of persistent or large-volume hemoptysis, high fever, worsening shortness of breath, chest pain, new wheezing, dizziness, or any other concerning or rapidly progressing symptoms.  Today's evaluation has revealed no signs of a dangerous process. Discussed diagnosis with patient and/or guardian. Patient and/or guardian aware of their diagnosis, possible red flag symptoms to watch out for and need for close follow up. Patient and/or guardian  understands verbal and written discharge instructions. Patient and/or guardian comfortable with plan and disposition.  Patient and/or guardian has a clear mental status at this time, good insight into illness (after discussion and teaching) and has clear judgment to make decisions regarding their care  Documentation was completed with the aid of voice recognition software. Transcription may contain typographical errors.  Final Clinical Impressions(s) / UC Diagnoses   Final diagnoses:  Acute cough  Viral upper respiratory tract infection     Discharge Instructions      You were seen today for symptoms of a viral upper respiratory infection, including nasal congestion, chest heaviness, cough, shortness of breath, headache, fatigue, body aches, and blood-tinged mucus. Your flu and COVID tests were negative, and your lung exam was normal. The small amount of blood seen in your mucus is most consistent with nasal irritation from dry indoor air and frequent or forceful nose blowing rather than a lung infection or bleeding from the chest. You were given a nebulizer treatment to ease chest tightness and help mobilize secretions. Medications were prescribed to manage symptoms, including cetirizine -pseudoephedrine once daily for congestion, Flonase  nasal spray daily to reduce nasal inflammation, Mucinex  DM twice daily to thin mucus and reduce cough, and benzonatate  as needed for cough suppression. Do not take Claritin  while taking the cetirizine -pseudoephedrine; you may resume use of Claritin  after finishing the prescribed medication.   At home, continue supportive care by drinking plenty of fluids, resting, and using a humidifier at night to help keep your  nasal passages moist. You may apply a small amount of petroleum jelly gently inside the nostrils with a cotton swab to reduce dryness and bleeding. Warm showers or steam can help loosen congestion. Use acetaminophen  or ibuprofen  as directed for headache,  body aches, or discomfort if needed. Replace or sanitize your toothbrush once you are starting to feel better to avoid re-exposure to germs.  Follow up with your primary care provider if symptoms are not improving over the next several days, worsen, or persist beyond what is expected for a viral illness, or if you continue to have shortness of breath or recurrent nasal bleeding. Referral to a specialist such as ENT or pulmonology may be considered if symptoms become prolonged or recurrent. Seek emergency care immediately if you develop persistent or increasing amounts of blood in your sputum, high fever, worsening or severe shortness of breath, chest pain, new wheezing, dizziness or fainting, confusion, or any sudden or concerning change in your symptoms.      ED Prescriptions     Medication Sig Dispense Auth. Provider   benzonatate  (TESSALON ) 200 MG capsule Take 1 capsule (200 mg total) by mouth 3 (three) times daily as needed for cough. 30 capsule Kaleya Douse, FNP   cetirizine -pseudoephedrine (ZYRTEC -D) 5-120 MG tablet Take 1 tablet by mouth daily with breakfast for 10 days. 10 tablet Iola Lukes, FNP   fluticasone  (FLONASE ) 50 MCG/ACT nasal spray Place 2 sprays into both nostrils daily. Shake well before use. Gently blow nose before spraying. Do not blow nose immediately after use. You should not taste the medication or feel it going down your throat; if you do, adjust your technique. 16 g Iola Lukes, FNP   Dextromethorphan-guaiFENesin  (MUCINEX  DM MAXIMUM STRENGTH) 60-1200 MG TB12 Take 1 tablet by mouth 2 (two) times daily. 20 tablet Iola Lukes, FNP      PDMP not reviewed this encounter.   Iola Lukes, [place] 11/22/24 1630

## 2024-11-22 NOTE — ED Triage Notes (Addendum)
 Pt presents with a chief complaint of nasal congestion x 3 days. States she is also experiencing heaviness/discomfort throughout her chest, coughing, SOB, headaches, and no energy. Pt reports that when she blows her nose in the mornings, there is blood mixed in with the mucus. Currently rates overall discomfort a 9/10. 97% O2 stat room air. Hx of pneumonia.

## 2024-11-27 ENCOUNTER — Ambulatory Visit
Admission: RE | Admit: 2024-11-27 | Discharge: 2024-11-27 | Disposition: A | Source: Ambulatory Visit | Attending: Family Medicine | Admitting: Family Medicine

## 2024-11-27 DIAGNOSIS — M8580 Other specified disorders of bone density and structure, unspecified site: Secondary | ICD-10-CM | POA: Diagnosis not present

## 2024-11-28 ENCOUNTER — Ambulatory Visit

## 2024-12-04 ENCOUNTER — Ambulatory Visit

## 2024-12-04 DIAGNOSIS — M25551 Pain in right hip: Secondary | ICD-10-CM | POA: Insufficient documentation

## 2024-12-04 DIAGNOSIS — R262 Difficulty in walking, not elsewhere classified: Secondary | ICD-10-CM | POA: Diagnosis present

## 2024-12-04 NOTE — Therapy (Signed)
 OUTPATIENT PHYSICAL THERAPY LOWER EXTREMITY  Re certification Reporting Period 08/23/24 to 12/04/24  See note below for Objective Data and Assessment of Progress/Goals.     Patient Name: Dana Bryan MRN: [id number] DOB:[birth year], 73 y.o., female Today's Date: 12/04/2024  END OF SESSION:  PT End of Session - 12/04/24 0838     Visit Number 11    Date for Recertification  01/29/25    Authorization Type [employer]    PT Start Time 0840    PT Stop Time 0925    PT Time Calculation (min) 45 min    Activity Tolerance Patient tolerated treatment well    Behavior During Therapy [facility] for tasks assessed/performed          Past Medical History:  Diagnosis Date   Anxiety    on meds   Basal cell carcinoma    Bicuspid aortic valve    Cancer (HCC)    Cataract    no sx    Generalized headaches    Heart murmur    per Dr.Tabori   Hyperlipidemia    on meds   Pneumonia    Reactive depression (situational)    on meds   Squamous cell skin cancer, finger    Vasomotor rhinitis    Past Surgical History:  Procedure Laterality Date   BUBBLE STUDY  09/04/2022   Procedure: BUBBLE STUDY;  Surgeon: Barbaraann Darryle Ned, MD;  Location: [facility] ENDOSCOPY;  Service: Cardiovascular;;   COLONOSCOPY  2008   normal    COLONOSCOPY W/ POLYPECTOMY  2021   [facility] at [facility], TA and SSPs, 3 yr recall   COLONOSCOPY WITH PROPOFOL   11/30/2023   [facility]bruster   TEE WITHOUT CARDIOVERSION N/A 09/04/2022   Procedure: TRANSESOPHAGEAL ECHOCARDIOGRAM (TEE);  Surgeon: Barbaraann Darryle Ned, MD;  Location: [facility] ENDOSCOPY;  Service: Cardiovascular;  Laterality: N/A;   TONSILLECTOMY  1973   Patient Active Problem List   Diagnosis Date Noted   Electrolyte abnormality 11/10/2022   Aortic stenosis 07/23/2022   LBBB (left bundle branch block) 06/14/2022   Lichen sclerosus 01/07/2022   Osteopenia 09/09/2021   Heart murmur 01/23/2020   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   Routine health maintenance 08/06/2011    B12 deficiency 07/18/2008   Hyperlipidemia 02/15/2008   Anxiety and depression 02/15/2008    PCP: Mahlon Crank, MD  REFERRING PROVIDER: same  REFERRING DIAG: R hip pain  THERAPY DIAG:  Pain in right hip  Difficulty in walking, not elsewhere classified  Rationale for Evaluation and Treatment: Rehabilitation  ONSET DATE:  6 months , March 2025   SUBJECTIVE STATEMENT: Pt reported minimal pain today upon arrival of PT today. Stated she has not had much time recently to consistently do her HEP but states hip pain has decreased and now more so has low back pain. Pt reported that she has seen progress since starting PT.    10/18/24  responded well to the kinesiotaping and to the deep tissue massage.  Still pain with stair climbing, points to TFL as site of pain Eval:Reports 6 month h/o R hip pain.  Did take the steroid dose pack prescribed by MD, no improvement.  Pain the most prevalent with climbing steps, and with attempting to sleep, not painful or stiff upon waking  PERTINENT HISTORY: No known trauma, referred to PT from PCP PAIN:  Are you having pain? Yes: NPRS scale: 1 to 8  Pain location: primarily R lat hip and ant thigh, knee, sometimes medial thigh into groin  Pain description: deep pain,  ache Aggravating factors: lying down, turning over, stair climbing Relieving factors: lies prone   PRECAUTIONS: None  RED FLAGS: None   WEIGHT BEARING RESTRICTIONS: No  FALLS:  Has patient fallen in last 6 months? No  LIVING ENVIRONMENT: Lives with: lives with their spouse Lives in: House/apartment Stairs: 2 outdoor steps at her home, at daughter's home has 2 flights of steps  Has following equipment at home: None  OCCUPATION: retired  PLOF: Independent  PATIENT GOALS: be able to sleep without pain and be able to walk better  NEXT MD VISIT: 09/26/24  OBJECTIVE:  Note: Objective measures were completed at Evaluation unless otherwise noted.  DIAGNOSTIC  FINDINGS: NONE   PATIENT SURVEYS:  Lower Extremity Functional Score: 50 / 80 = 62.5 % Lower Extremity Functional Score: 51 / 80 = 63.7 %  COGNITION: Overall cognitive status: Within functional limits for tasks assessed     SENSATION: WFL  EDEMA:  None noted  MUSCLE LENGTH: Hamstrings: Right wfl deg; Left wfl deg [name]: Right wnl deg; Left -5 deg  POSTURE: rounded shoulders and decreased lumbar lordosis  PALPATION: Very tender R gluteals, piriformis, post hip jt   10/09/24: pain R obturator, R piriformis, R glut medius  LOWER EXTREMITY ROM:  AA ROM Right eval Left eval 10/09/24:  R  Hip flexion 100  wnl  Hip extension     Hip abduction 30 45 30 with R groin pain  Hip adduction     Hip internal rotation -10 -10   Hip external rotation 15 25 15  with R groin and R post/lat hip pain  Knee flexion     Knee extension     Ankle dorsiflexion     Ankle plantarflexion     Ankle inversion     Ankle eversion      (Blank rows = not tested)  LOWER EXTREMITY MMT:  MMT Right eval Left eval 10/09/24 R  Hip flexion 4+p!    Hip extension lock bridge  4 5   Hip abduction 4+ 4+ 5  Hip adduction     Hip internal rotation     Hip external rotation 3+ p! wnl 3+ /5 with pain R groin and lat hip  Knee flexion     Knee extension 4p! R lat hip    Ankle dorsiflexion     Ankle plantarflexion     Ankle inversion     Ankle eversion      (Blank rows = not tested)  LOWER EXTREMITY SPECIAL TESTS:  Hip special tests: Belvie (FABER) test: negative, Thomas test: negative, and Hip scouring test: positive   FUNCTIONAL TESTS:  10/09/24: forward step ups on 8 step 10 reps no pain  Improved control and baseline pain per pt with theraband wrap as make shift SI belt with the forward step ups Single leg stance: R 20 sec , L greater than 30, pain R lat hip  GAIT: Distance walked: in clinic Assistive device utilized: None Level of assistance: Complete Independence Comments: no  specific antalgia or deficits noted   10/20: mild R laterla pelvic shift noted in R weight bearing.  TREATMENT DATE:  12/04/24 NuStep L5 x 6 min  Reassessment of goals Stability ball rollouts for low back Glute bridges 2x10 Trunk ext Btband 2x10 Standing rows Gtband 2x10 Cable pulldowns 20# 2x10 Step Ups 8 2x10 Lateral band walks R tband Hip 3 way Gtband   11/06/24 -Reassessment of goals -NuStep L5x87min -Red resistance band Clam Shells  -Glute Bridges -Step Ups 6 -Resisted gait 20# (lateral walking)  -Iontophoresis patch placement #3- not billed  11/01/24 Nustep level 5 x 6 minutes Leg press 20#, tried single legs some discomfort in the quads On airex 5# straight arm pulls, cues for posture and core activation Feet on ball K2C, rotation, bridge, isometric abs Ball b/n kness squeeze Blue tband clamshells PAssive stretch LE's Manual sheet traction Ionto 80mA dose to the right GT area, did not charge due to her insurance not covering  10/30/24 Nustep level 5 x 6 minutes Leg curls 20# 5# leg ext 2.5# hip abduction right lateral hip pain 2.5# hip extension Feet on ball K2C, rotation, bridge, isometric abs Passive stretch right HS, piriformis and adductors STM with the Tgun to the right ITB and right glutes Manual sheet traction I did apply an ionto patch with 80mA dose with dex to the right GT area, did not charge due to her insurance   10/27/24: Nustep L5x5min -Hamstring curls seated w green band 2x10 -Hip adduction with ball 2x10 -Hip abduction 2x10 green band  -glute bridge 2x10 -Step Ups 2x10 6 -STM R hip   10/18/24:  Nustep level 5 5 min Side lying on L for lumbar extension, rot. SB restriction muscle energy Side lying L for deep cross friction massage lateral and posterior R hip musculature with theragun Kinesiotaping R hip 2 I  pieces extending from lateral thigh to mid lumbar to assisted R hip abductors, Er stabilizers.   10/13/24 Nustep level 5 x 5 minutes Leg press 20# 2x10 HS curls 20# 2x10 Feet on ball K2C, rotation, small bridge, isometric abs Red tband clamshells Ball b/n knees squeeze Passive stretch HS, piriformis, adductors and hip flexors STM to the right buttock, ITB and adductor Added to HEP  10/09/24: reassessment:  Instructed in: Seated R gluteal self massage/ with tennis ball  Cat /cow, LTR Kinesiotape R post/ lat hip 2 -I pieces extending from R lateral trochanter to lumbar region and second strip from lateral trochanter to sciatic notch   08/30/24:  Nustep L 5 ,6 min In supine R hip ER provokes R lat hip pain and R groin pain In side lying L, R clam shell also provoked R groin pain  Manual: supine R hip distraction with thrust x 3, with minimal reduction in R groin pain with provoking movements Prone for deep soft tissue myofascial release, massage R piriformis with theragun Supine for contract relax for alt resisted hip and knee ext 5 sec holds Supine manually assisted B hip and knee flexion for LTR's  Leg press R LE 20#, 20 reps Adapted bridge to unilateral bridge per medbridge HEP below     08/28/24:  Nustep Level 5 x 6 min Supine for mobilization with movement , with R hip ER. 3 bouts of 10 reps each with inf/lat glides R prox femur Prone for deep soft tissue massage R gluteus medius, piriformis Supine for R hip distraction with end range stretch with hip in open pack position  Reviewed home program of bridging and isometric hip abd/ER Added cat cows to home routine  08/23/24:  Evaluation, education in anatomy of  hip, spine, surrounding structures Lateral glides of R prox femur, combined with R hip ER, mob with movement, with improvements in pain free ROM R hip flex and ER Instructed in the ex as described below    PATIENT EDUCATION:  Education details: POC, goals Person  educated: Patient Education method: Explanation, Demonstration, Tactile cues, Verbal cues, and Handouts Education comprehension: verbalized understanding  HOME EXERCISE PROGRAM: Access Code: [email or web address] URL: [email or web address] Date: 08/30/2024 Prepared by: Greig Speaks  Exercises - Hooklying Isometric Hip Abduction with Belt  - 1 x daily - 7 x weekly - 10 reps - 5 hold - Single Leg Bridge  - 1 x daily - 7 x weekly - 3 sets - 10 reps Access Code: [email or web address] URL: [email or web address] Date: 08/23/2024 Prepared by: Greig Credit  Exercises - Bridge on BOSU  - 1 x daily - 3 x weekly - 3 sets - 10 reps - Hooklying Isometric Hip Abduction with Belt  - 1 x daily - 7 x weekly - 10 reps - 5 hold Access Code: [email or web address] URL: [email or web address] Date: 10/13/2024 Prepared by: Ozell Mainland  Exercises - Supine Piriformis Stretch Pulling Heel to Hip  - 2 x daily - 7 x weekly - 1 sets - 5 reps - 30 hold - Supine Hip Adductor Stretch  - 2 x daily - 7 x weekly - 1 sets - 5 reps - 30 hold ASSESSMENT:  CLINICAL IMPRESSION: Pt exhibits decreased pain in the R hip with ability to do more functional activity without pain increasing in the R hip; ability to add resistance exercises targeted at the hip abductors. Pt reported some low back pain so PT today focused on stretching the low back and light strengthening focused on the lats. Pt given theraband to increase HEP maintenance.   11/01/24  Patient overall reports less tenderness, really only has pain with going up steps.   I continued with some manual sheet traction to see if this would help the groin pain as she felt like it did, I did ionto but did not charge due to her insurance not covering, I feel like this would be a good thing to try to allow MD to make a better decision on future treatment  10/09/24 The patient returned today for additional assessment of R hip / L4/5 radiculopathy pain  following consult  with orthopedist.  R hip ER, abd, very painful radiating to groin and lateral/ post hip.  Also associated weakness R hip ER especially.  Changed our treatment approach, to address soft tissue adhesions in gluteals and R hip external rotators, also added stability with trial with kinesiotape.  Also address possible lumbar involvement with cat cow and lower trunk rotation.  May need progression with deep stabilization ex R hip.        Eval: Patient is a 73 y.o. female  who was evaluated today by physical therapy  for R hip pain. She presents with tenderness R lateral, posterior hip musculature, weakness R hip for ER , extension, and flexion, pain with scour test R hip, some loss of ROM R hip as compared to L particularly for flexion and ER.  Her lumbar ROM is wfl.  She responded positively to R hip jt/ capsular mobilization today, we also initiated isometric ex for her hip musculature. She should benefit from skilled PT to address her deficits and improve her Sx.  She returns to her PCP in 4 weeks, and if she continues with pain after 2-3 weeks of PT might contact her MD  about possible x ray.    OBJECTIVE IMPAIRMENTS: decreased activity tolerance, decreased balance, decreased endurance, decreased knowledge of condition, difficulty walking, decreased ROM, decreased strength, impaired flexibility, postural dysfunction, and pain.   ACTIVITY LIMITATIONS: carrying, standing, sleeping, stairs, locomotion level, and caring for others  PARTICIPATION LIMITATIONS: meal prep, shopping, community activity, and yard work  PERSONAL FACTORS: Age, Behavior pattern, Time since onset of injury/illness/exacerbation, and 1-2 comorbidities: osteopenia,aortic stenosis  are also affecting patient's functional outcome.   REHAB POTENTIAL: Good  CLINICAL DECISION MAKING: Evolving/moderate complexity  EVALUATION COMPLEXITY: Moderate   GOALS: Goals reviewed with patient? Yes  SHORT TERM GOALS: Target date: 2 weeks,  09/06/24 I HEP Baseline:initiated today Goal status: Met 10/13/24   LONG TERM GOALS: Target date:extended another 8 weeks,  01/30/24  Pt able to climb 8 steps or complete 8 forward step ups with R LE on 8 step , without pain R hip Baseline:  Goal status: INITIAL 10/09/24 some pain not met yet; Still with pain 10/27/24, IN PROGRESS able to do with some pain still 11/06/24; Goal MET 1 trial of steps w/o pain 12/04/24  2.  Single leg stance R Le greater than 30 sec, without pain R hip Baseline: p! 20 sec Goal status: INITIAL 10/09/24; not assessed today; RLE 30s LLE 13s 10/27/24, progressing 11/01/24; RLE 16s LLE 30s+ 11/06/24; GOAL MET 12/04/24  3.  Strength R hip all planes improve to 4+ to 5/5 Baseline: see above chart  Goal status: progress, see chart; IN PROGRESS add/abd 5/5 11/06/24; ONGOING 12/04/24  4.  ROM R hip improve to normal Faber's position, normal flexion, Er  Baseline:  Goal status: 10/09/24: provocative, ongoing 11/01/24; Still provocative 11/06/24; ONGOING 12 /15/25 slight pain     PLAN:  PT FREQUENCY: 2x/week  PT DURATION: 8 weeks  PLANNED INTERVENTIONS: 97110-Therapeutic exercises, 97530- Therapeutic activity, 97112- Neuromuscular re-education, 97535- Self Care, 02859- Manual therapy, 97033- Ionotophoresis 4mg /ml Dexamethasone, and Joint mobilization  PLAN FOR NEXT SESSION: recommend manual techniques, deep tissue massage R post/ lat hip musculature, R hip mobilization, progress / reassess therex for R hip, emphasis on stabilization, also addreess lumbar flexibility, see how today's treatment went   Thersia Alder, Student-PT 12/04/2024, 9:30 AM [facility]. [place], [place], [address] Phone: [phone]   Fax:  [phone]

## 2024-12-07 ENCOUNTER — Ambulatory Visit: Admitting: Physical Therapy

## 2024-12-07 DIAGNOSIS — R262 Difficulty in walking, not elsewhere classified: Secondary | ICD-10-CM

## 2024-12-07 DIAGNOSIS — M25551 Pain in right hip: Secondary | ICD-10-CM

## 2024-12-07 NOTE — Therapy (Signed)
 OUTPATIENT PHYSICAL THERAPY LOWER EXTREMITY      Patient Name: Dana Bryan MRN: [id number] DOB:04-26-1951, 73 y.o., female Today's Date: 12/07/2024  END OF SESSION:  PT End of Session - 12/07/24 1402     Visit Number 12    Date for Recertification  01/29/25    Authorization Type [employer]    PT Start Time 1400    PT Stop Time 1440    PT Time Calculation (min) 40 min          Past Medical History:  Diagnosis Date   Anxiety    on meds   Basal cell carcinoma    Bicuspid aortic valve    Cancer (HCC)    Cataract    no sx    Generalized headaches    Heart murmur    per Dr.Tabori   Hyperlipidemia    on meds   Pneumonia    Reactive depression (situational)    on meds   Squamous cell skin cancer, finger    Vasomotor rhinitis    Past Surgical History:  Procedure Laterality Date   BUBBLE STUDY  09/04/2022   Procedure: BUBBLE STUDY;  Surgeon: Barbaraann Darryle Ned, MD;  Location: [facility] ENDOSCOPY;  Service: Cardiovascular;;   COLONOSCOPY  2008   normal    COLONOSCOPY W/ POLYPECTOMY  2021   [facility], TA and SSPs, 3 yr recall   COLONOSCOPY WITH PROPOFOL   11/30/2023   [facility]   TEE WITHOUT CARDIOVERSION N/A 09/04/2022   Procedure: TRANSESOPHAGEAL ECHOCARDIOGRAM (TEE);  Surgeon: Barbaraann Darryle Ned, MD;  Location: [facility] ENDOSCOPY;  Service: Cardiovascular;  Laterality: N/A;   TONSILLECTOMY  1973   Patient Active Problem List   Diagnosis Date Noted   Electrolyte abnormality 11/10/2022   Aortic stenosis 07/23/2022   LBBB (left bundle branch block) 06/14/2022   Lichen sclerosus 01/07/2022   Osteopenia 09/09/2021   Heart murmur 01/23/2020   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   Routine health maintenance 08/06/2011   B12 deficiency 07/18/2008   Hyperlipidemia 02/15/2008   Anxiety and depression 02/15/2008    PCP: Mahlon Crank, MD  REFERRING PROVIDER: same  REFERRING DIAG: R hip pain  THERAPY DIAG:  Pain in right hip  Difficulty in  walking, not elsewhere classified  Rationale for Evaluation and Treatment: Rehabilitation  ONSET DATE:  6 months , March 2025   SUBJECTIVE STATEMENT:  Nagging pain but overall better. More back than hip. Been doing a lot and not as much of my ex need to be better.   10/18/24  responded well to the kinesiotaping and to the deep tissue massage.  Still pain with stair climbing, points to TFL as site of pain Eval:Reports 6 month h/o R hip pain.  Did take the steroid dose pack prescribed by MD, no improvement.  Pain the most prevalent with climbing steps, and with attempting to sleep, not painful or stiff upon waking  PERTINENT HISTORY: No known trauma, referred to PT from PCP PAIN:  Are you having pain? Yes: NPRS scale: 1 to 8  Pain location: primarily R lat hip and ant thigh, knee, sometimes medial thigh into groin  Pain description: deep pain, ache Aggravating factors: lying down, turning over, stair climbing Relieving factors: lies prone   PRECAUTIONS: None  RED FLAGS: None   WEIGHT BEARING RESTRICTIONS: No  FALLS:  Has patient fallen in last 6 months? No  LIVING ENVIRONMENT: Lives with: lives with their spouse Lives in: House/apartment Stairs: 2 outdoor steps at her home, at daughter's home  has 2 flights of steps  Has following equipment at home: None  OCCUPATION: retired  PLOF: Independent  PATIENT GOALS: be able to sleep without pain and be able to walk better  NEXT MD VISIT: 09/26/24  OBJECTIVE:  Note: Objective measures were completed at Evaluation unless otherwise noted.  DIAGNOSTIC FINDINGS: NONE   PATIENT SURVEYS:  Lower Extremity Functional Score: 50 / 80 = 62.5 % Lower Extremity Functional Score: 51 / 80 = 63.7 %  COGNITION: Overall cognitive status: Within functional limits for tasks assessed     SENSATION: WFL  EDEMA:  None noted  MUSCLE LENGTH: Hamstrings: Right wfl deg; Left wfl deg [name]: Right wnl deg; Left -5 deg  POSTURE:  rounded shoulders and decreased lumbar lordosis  PALPATION: Very tender R gluteals, piriformis, post hip jt   10/09/24: pain R obturator, R piriformis, R glut medius  LOWER EXTREMITY ROM:  AA ROM Right eval Left eval 10/09/24:  R  Hip flexion 100  wnl  Hip extension     Hip abduction 30 45 30 with R groin pain  Hip adduction     Hip internal rotation -10 -10   Hip external rotation 15 25 15  with R groin and R post/lat hip pain  Knee flexion     Knee extension     Ankle dorsiflexion     Ankle plantarflexion     Ankle inversion     Ankle eversion      (Blank rows = not tested)  LOWER EXTREMITY MMT:  MMT Right eval Left eval 10/09/24 R  Hip flexion 4+p!    Hip extension lock bridge  4 5   Hip abduction 4+ 4+ 5  Hip adduction     Hip internal rotation     Hip external rotation 3+ p! wnl 3+ /5 with pain R groin and lat hip  Knee flexion     Knee extension 4p! R lat hip    Ankle dorsiflexion     Ankle plantarflexion     Ankle inversion     Ankle eversion      (Blank rows = not tested)  LOWER EXTREMITY SPECIAL TESTS:  Hip special tests: Belvie (FABER) test: negative, Thomas test: negative, and Hip scouring test: positive   FUNCTIONAL TESTS:  10/09/24: forward step ups on 8 step 10 reps no pain  Improved control and baseline pain per pt with theraband wrap as make shift SI belt with the forward step ups Single leg stance: R 20 sec , L greater than 30, pain R lat hip  GAIT: Distance walked: in clinic Assistive device utilized: None Level of assistance: Complete Independence Comments: no specific antalgia or deficits noted   10/20: mild R laterla pelvic shift noted in R weight bearing.                                                                                                                              TREATMENT DATE:  12/07/24 Nustep L 5 STS with wt ball 10 x with chest press then 10x with OH Step up with opp leg ext 6 in 10 x each  Cable  pulleys 10# shld ext and rows 2 sets 10 each Feet on ball bridge, KTC,obl  Isometric abdominals hold 3 sec 15 x Bridge with ball btwn knee 10 x Bridge with clams and green tband 10 x Gtband single leg clams 10x each Gtband alt marching 20 x 2 sets 4 way pelvic ROM on dyna disc 10 x each , then LAQ and hip flexion on disc with green tband Black tband trunk flexion and ext 2 sets 10 Leg press 30# 2 sets 10   12/04/24 NuStep L5 x 6 min  Reassessment of goals Stability ball rollouts for low back Glute bridges 2x10 Trunk ext Btband 2x10 Standing rows Gtband 2x10 Cable pulldowns 20# 2x10 Step Ups 8 2x10 Lateral band walks R tband Hip 3 way Gtband   11/06/24 -Reassessment of goals -NuStep L5x69min -Red resistance band Clam Shells  -Glute Bridges -Step Ups 6 -Resisted gait 20# (lateral walking)  -Iontophoresis patch placement #3- not billed  11/01/24 Nustep level 5 x 6 minutes Leg press 20#, tried single legs some discomfort in the quads On airex 5# straight arm pulls, cues for posture and core activation Feet on ball K2C, rotation, bridge, isometric abs Ball b/n kness squeeze Blue tband clamshells PAssive stretch LE's Manual sheet traction Ionto 80mA dose to the right GT area, did not charge due to her insurance not covering  10/30/24 Nustep level 5 x 6 minutes Leg curls 20# 5# leg ext 2.5# hip abduction right lateral hip pain 2.5# hip extension Feet on ball K2C, rotation, bridge, isometric abs Passive stretch right HS, piriformis and adductors STM with the Tgun to the right ITB and right glutes Manual sheet traction I did apply an ionto patch with 80mA dose with dex to the right GT area, did not charge due to her insurance   10/27/24: Nustep L5x5min -Hamstring curls seated w green band 2x10 -Hip adduction with ball 2x10 -Hip abduction 2x10 green band  -glute bridge 2x10 -Step Ups 2x10 6 -STM R hip   10/18/24:  Nustep level 5 5 min Side lying on L for  lumbar extension, rot. SB restriction muscle energy Side lying L for deep cross friction massage lateral and posterior R hip musculature with theragun Kinesiotaping R hip 2 I pieces extending from lateral thigh to mid lumbar to assisted R hip abductors, Er stabilizers.   10/13/24 Nustep level 5 x 5 minutes Leg press 20# 2x10 HS curls 20# 2x10 Feet on ball K2C, rotation, small bridge, isometric abs Red tband clamshells Ball b/n knees squeeze Passive stretch HS, piriformis, adductors and hip flexors STM to the right buttock, ITB and adductor Added to HEP  10/09/24: reassessment:  Instructed in: Seated R gluteal self massage/ with tennis ball  Cat /cow, LTR Kinesiotape R post/ lat hip 2 -I pieces extending from R lateral trochanter to lumbar region and second strip from lateral trochanter to sciatic notch   08/30/24:  Nustep L 5 ,6 min In supine R hip ER provokes R lat hip pain and R groin pain In side lying L, R clam shell also provoked R groin pain  Manual: supine R hip distraction with thrust x 3, with minimal reduction in R groin pain with provoking movements Prone for deep soft tissue myofascial release, massage R piriformis with theragun Supine for contract relax for alt  resisted hip and knee ext 5 sec holds Supine manually assisted B hip and knee flexion for LTR's  Leg press R LE 20#, 20 reps Adapted bridge to unilateral bridge per medbridge HEP below     08/28/24:  Nustep Level 5 x 6 min Supine for mobilization with movement , with R hip ER. 3 bouts of 10 reps each with inf/lat glides R prox femur Prone for deep soft tissue massage R gluteus medius, piriformis Supine for R hip distraction with end range stretch with hip in open pack position  Reviewed home program of bridging and isometric hip abd/ER Added cat cows to home routine  08/23/24:  Evaluation, education in anatomy of hip, spine, surrounding structures Lateral glides of R prox femur, combined with R hip ER,  mob with movement, with improvements in pain free ROM R hip flex and ER Instructed in the ex as described below    PATIENT EDUCATION:  Education details: POC, goals Person educated: Patient Education method: Explanation, Demonstration, Tactile cues, Verbal cues, and Handouts Education comprehension: verbalized understanding  HOME EXERCISE PROGRAM: Access Code: [email or web address] URL: [email or web address] Date: 08/30/2024 Prepared by: Greig Speaks  Exercises - Hooklying Isometric Hip Abduction with Belt  - 1 x daily - 7 x weekly - 10 reps - 5 hold - Single Leg Bridge  - 1 x daily - 7 x weekly - 3 sets - 10 reps Access Code: [email or web address] URL: [email or web address] Date: 08/23/2024 Prepared by: Greig Credit  Exercises - Bridge on BOSU  - 1 x daily - 3 x weekly - 3 sets - 10 reps - Hooklying Isometric Hip Abduction with Belt  - 1 x daily - 7 x weekly - 10 reps - 5 hold Access Code: [email or web address] URL: [email or web address] Date: 10/13/2024 Prepared by: Ozell Mainland  Exercises - Supine Piriformis Stretch Pulling Heel to Hip  - 2 x daily - 7 x weekly - 1 sets - 5 reps - 30 hold - Supine Hip Adductor Stretch  - 2 x daily - 7 x weekly - 1 sets - 5 reps - 30 hold ASSESSMENT:  CLINICAL IMPRESSION: Session focus on core engagement and stab with ex and she did well but some cuing needed- verb fatigue but no pain. Overall she tolerated well    Eval: Patient is a 73 y.o. female  who was evaluated today by physical therapy  for R hip pain. She presents with tenderness R lateral, posterior hip musculature, weakness R hip for ER , extension, and flexion, pain with scour test R hip, some loss of ROM R hip as compared to L particularly for flexion and ER.  Her lumbar ROM is wfl.  She responded positively to R hip jt/ capsular mobilization today, we also initiated isometric ex for her hip musculature. She should benefit from skilled PT to address her deficits and improve  her Sx.  She returns to her PCP in 4 weeks, and if she continues with pain after 2-3 weeks of PT might contact her MD about possible x ray.    OBJECTIVE IMPAIRMENTS: decreased activity tolerance, decreased balance, decreased endurance, decreased knowledge of condition, difficulty walking, decreased ROM, decreased strength, impaired flexibility, postural dysfunction, and pain.   ACTIVITY LIMITATIONS: carrying, standing, sleeping, stairs, locomotion level, and caring for others  PARTICIPATION LIMITATIONS: meal prep, shopping, community activity, and yard work  PERSONAL FACTORS: Age, Behavior pattern, Time since onset of injury/illness/exacerbation, and 1-2 comorbidities: osteopenia,aortic stenosis  are also affecting patient's functional outcome.   REHAB POTENTIAL: Good  CLINICAL  DECISION MAKING: Evolving/moderate complexity  EVALUATION COMPLEXITY: Moderate   GOALS: Goals reviewed with patient? Yes  SHORT TERM GOALS: Target date: 2 weeks, 09/06/24 I HEP Baseline:initiated today Goal status: Met 10/13/24   LONG TERM GOALS: Target date:extended another 8 weeks,  01/30/24  Pt able to climb 8 steps or complete 8 forward step ups with R LE on 8 step , without pain R hip Baseline:  Goal status: INITIAL 10/09/24 some pain not met yet; Still with pain 10/27/24, IN PROGRESS able to do with some pain still 11/06/24; Goal MET 1 trial of steps w/o pain 12/04/24  2.  Single leg stance R Le greater than 30 sec, without pain R hip Baseline: p! 20 sec Goal status: INITIAL 10/09/24; not assessed today; RLE 30s LLE 13s 10/27/24, progressing 11/01/24; RLE 16s LLE 30s+ 11/06/24; GOAL MET 12/04/24  3.  Strength R hip all planes improve to 4+ to 5/5 Baseline: see above chart  Goal status: progress, see chart; IN PROGRESS add/abd 5/5 11/06/24; ONGOING 12/04/24  4.  ROM R hip improve to normal Faber's position, normal flexion, Er  Baseline:  Goal status: 10/09/24: provocative, ongoing 11/01/24; Still  provocative 11/06/24; ONGOING 12 /15/25 slight pain     PLAN:  PT FREQUENCY: 2x/week  PT DURATION: 8 weeks  PLANNED INTERVENTIONS: 97110-Therapeutic exercises, 97530- Therapeutic activity, 97112- Neuromuscular re-education, 97535- Self Care, [id number]- Manual therapy, [phone]- Ionotophoresis 4mg /ml Dexamethasone, and Joint mobilization  PLAN FOR NEXT SESSION: core stabilization Janique Hoefer,ANGIE, PTA 12/07/2024, 2:35 PM [place] [facility] [address] [facility] [place], [place], [address] Phone: [phone]   Fax:  [phone]

## 2024-12-12 ENCOUNTER — Ambulatory Visit: Admitting: Physical Therapy

## 2024-12-12 ENCOUNTER — Other Ambulatory Visit: Payer: Self-pay | Admitting: Family Medicine

## 2024-12-12 DIAGNOSIS — R262 Difficulty in walking, not elsewhere classified: Secondary | ICD-10-CM

## 2024-12-12 DIAGNOSIS — M25551 Pain in right hip: Secondary | ICD-10-CM | POA: Diagnosis not present

## 2024-12-12 NOTE — Therapy (Signed)
 " OUTPATIENT PHYSICAL THERAPY LOWER EXTREMITY      Patient Name: Dana Bryan MRN: [id number] DOB:09/23/1951, 73 y.o., female Today's Date: 12/12/2024  END OF SESSION:  PT End of Session - 12/12/24 0840     Visit Number 13    Date for Recertification  01/29/25    Authorization Type [employer]    PT Start Time 0840    PT Stop Time 0925    PT Time Calculation (min) 45 min          Past Medical History:  Diagnosis Date   Anxiety    on meds   Basal cell carcinoma    Bicuspid aortic valve    Cancer (HCC)    Cataract    no sx    Generalized headaches    Heart murmur    per Dr.Tabori   Hyperlipidemia    on meds   Pneumonia    Reactive depression (situational)    on meds   Squamous cell skin cancer, finger    Vasomotor rhinitis    Past Surgical History:  Procedure Laterality Date   BUBBLE STUDY  09/04/2022   Procedure: BUBBLE STUDY;  Surgeon: Barbaraann Darryle Ned, MD;  Location: [facility] ENDOSCOPY;  Service: Cardiovascular;;   COLONOSCOPY  2008   normal    COLONOSCOPY W/ POLYPECTOMY  2021   [facility], TA and SSPs, 3 yr recall   COLONOSCOPY WITH PROPOFOL   11/30/2023   armbruster   TEE WITHOUT CARDIOVERSION N/A 09/04/2022   Procedure: TRANSESOPHAGEAL ECHOCARDIOGRAM (TEE);  Surgeon: Barbaraann Darryle Ned, MD;  Location: [facility] ENDOSCOPY;  Service: Cardiovascular;  Laterality: N/A;   TONSILLECTOMY  1973   Patient Active Problem List   Diagnosis Date Noted   Electrolyte abnormality 11/10/2022   Aortic stenosis 07/23/2022   LBBB (left bundle branch block) 06/14/2022   Lichen sclerosus 01/07/2022   Osteopenia 09/09/2021   Heart murmur 01/23/2020   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   Routine health maintenance 08/06/2011   B12 deficiency 07/18/2008   Hyperlipidemia 02/15/2008   Anxiety and depression 02/15/2008    PCP: Mahlon Crank, MD  REFERRING PROVIDER: same  REFERRING DIAG: R hip pain  THERAPY DIAG:  Pain in right hip  Difficulty in  walking, not elsewhere classified  Rationale for Evaluation and Treatment: Rehabilitation  ONSET DATE:  6 months , March 2025   SUBJECTIVE STATEMENT:  Minimal back pain but always have that. Good after last session.   10/18/24  responded well to the kinesiotaping and to the deep tissue massage.  Still pain with stair climbing, points to TFL as site of pain Eval:Reports 6 month h/o R hip pain.  Did take the steroid dose pack prescribed by MD, no improvement.  Pain the most prevalent with climbing steps, and with attempting to sleep, not painful or stiff upon waking  PERTINENT HISTORY: No known trauma, referred to PT from PCP PAIN:  Are you having pain? Yes: NPRS scale: 1 to 8  Pain location: primarily R lat hip and ant thigh, knee, sometimes medial thigh into groin  Pain description: deep pain, ache Aggravating factors: lying down, turning over, stair climbing Relieving factors: lies prone   PRECAUTIONS: None  RED FLAGS: None   WEIGHT BEARING RESTRICTIONS: No  FALLS:  Has patient fallen in last 6 months? No  LIVING ENVIRONMENT: Lives with: lives with their spouse Lives in: House/apartment Stairs: 2 outdoor steps at her home, at daughter's home has 2 flights of steps  Has following equipment at home: None  OCCUPATION: retired  PLOF: Independent  PATIENT GOALS: be able to sleep without pain and be able to walk better  NEXT MD VISIT: 09/26/24  OBJECTIVE:  Note: Objective measures were completed at Evaluation unless otherwise noted.  DIAGNOSTIC FINDINGS: NONE   PATIENT SURVEYS:  Lower Extremity Functional Score: 50 / 80 = 62.5 % Lower Extremity Functional Score: 51 / 80 = 63.7 %  COGNITION: Overall cognitive status: Within functional limits for tasks assessed     SENSATION: WFL  EDEMA:  None noted  MUSCLE LENGTH: Hamstrings: Right wfl deg; Left wfl deg [name]: Right wnl deg; Left -5 deg  POSTURE: rounded shoulders and decreased lumbar  lordosis  PALPATION: Very tender R gluteals, piriformis, post hip jt   10/09/24: pain R obturator, R piriformis, R glut medius  LOWER EXTREMITY ROM:  AA ROM Right eval Left eval 10/09/24:  R  Hip flexion 100  wnl  Hip extension     Hip abduction 30 45 30 with R groin pain  Hip adduction     Hip internal rotation -10 -10   Hip external rotation 15 25 15  with R groin and R post/lat hip pain  Knee flexion     Knee extension     Ankle dorsiflexion     Ankle plantarflexion     Ankle inversion     Ankle eversion      (Blank rows = not tested)  LOWER EXTREMITY MMT:  MMT Right eval Left eval 10/09/24 R  Hip flexion 4+p!    Hip extension lock bridge  4 5   Hip abduction 4+ 4+ 5  Hip adduction     Hip internal rotation     Hip external rotation 3+ p! wnl 3+ /5 with pain R groin and lat hip  Knee flexion     Knee extension 4p! R lat hip    Ankle dorsiflexion     Ankle plantarflexion     Ankle inversion     Ankle eversion      (Blank rows = not tested)  LOWER EXTREMITY SPECIAL TESTS:  Hip special tests: Belvie (FABER) test: negative, Thomas test: negative, and Hip scouring test: positive   FUNCTIONAL TESTS:  10/09/24: forward step ups on 8 step 10 reps no pain  Improved control and baseline pain per pt with theraband wrap as make shift SI belt with the forward step ups Single leg stance: R 20 sec , L greater than 30, pain R lat hip  GAIT: Distance walked: in clinic Assistive device utilized: None Level of assistance: Complete Independence Comments: no specific antalgia or deficits noted   10/20: mild R laterla pelvic shift noted in R weight bearing.                                                                                                                              TREATMENT DATE:    12/12/24 Nustep L 5 Resisted gait 4 ways 5  x each 30# 6# dead lift into upright row 2 sets 10 STS with wt ball chest press 10 x, then OH 10 x Wall sit with clams 15  x Wall sit alt marching 20 x Wall sit alt LAQ 20 x Black tband trunk flex and ext 20 x each Lat Pull down 2 sets 10 25# Seated row 2 sets 10 25# Core stab ex 10 min supine    12/07/24 Nustep L 5 STS with wt ball 10 x with chest press then 10x with OH Step up with opp leg ext 6 in 10 x each  Cable pulleys 10# shld ext and rows 2 sets 10 each Feet on ball bridge, KTC,obl  Isometric abdominals hold 3 sec 15 x Bridge with ball btwn knee 10 x Bridge with clams and green tband 10 x Gtband single leg clams 10x each Gtband alt marching 20 x 2 sets 4 way pelvic ROM on dyna disc 10 x each , then LAQ and hip flexion on disc with green tband Black tband trunk flexion and ext 2 sets 10 Leg press 30# 2 sets 10   12/04/24 NuStep L5 x 6 min  Reassessment of goals Stability ball rollouts for low back Glute bridges 2x10 Trunk ext Btband 2x10 Standing rows Gtband 2x10 Cable pulldowns 20# 2x10 Step Ups 8 2x10 Lateral band walks R tband Hip 3 way Gtband   11/06/24 -Reassessment of goals -NuStep L5x68min -Red resistance band Clam Shells  -Glute Bridges -Step Ups 6 -Resisted gait 20# (lateral walking)  -Iontophoresis patch placement #3- not billed  11/01/24 Nustep level 5 x 6 minutes Leg press 20#, tried single legs some discomfort in the quads On airex 5# straight arm pulls, cues for posture and core activation Feet on ball K2C, rotation, bridge, isometric abs Ball b/n kness squeeze Blue tband clamshells PAssive stretch LE's Manual sheet traction Ionto 80mA dose to the right GT area, did not charge due to her insurance not covering  10/30/24 Nustep level 5 x 6 minutes Leg curls 20# 5# leg ext 2.5# hip abduction right lateral hip pain 2.5# hip extension Feet on ball K2C, rotation, bridge, isometric abs Passive stretch right HS, piriformis and adductors STM with the Tgun to the right ITB and right glutes Manual sheet traction I did apply an ionto patch with 80mA dose  with dex to the right GT area, did not charge due to her insurance   10/27/24: Nustep L5x5min -Hamstring curls seated w green band 2x10 -Hip adduction with ball 2x10 -Hip abduction 2x10 green band  -glute bridge 2x10 -Step Ups 2x10 6 -STM R hip   10/18/24:  Nustep level 5 5 min Side lying on L for lumbar extension, rot. SB restriction muscle energy Side lying L for deep cross friction massage lateral and posterior R hip musculature with theragun Kinesiotaping R hip 2 I pieces extending from lateral thigh to mid lumbar to assisted R hip abductors, Er stabilizers.   10/13/24 Nustep level 5 x 5 minutes Leg press 20# 2x10 HS curls 20# 2x10 Feet on ball K2C, rotation, small bridge, isometric abs Red tband clamshells Ball b/n knees squeeze Passive stretch HS, piriformis, adductors and hip flexors STM to the right buttock, ITB and adductor Added to HEP  10/09/24: reassessment:  Instructed in: Seated R gluteal self massage/ with tennis ball  Cat /cow, LTR Kinesiotape R post/ lat hip 2 -I pieces extending from R lateral trochanter to lumbar region and second strip from lateral trochanter to sciatic notch  08/30/24:  Nustep L 5 ,6 min In supine R hip ER provokes R lat hip pain and R groin pain In side lying L, R clam shell also provoked R groin pain  Manual: supine R hip distraction with thrust x 3, with minimal reduction in R groin pain with provoking movements Prone for deep soft tissue myofascial release, massage R piriformis with theragun Supine for contract relax for alt resisted hip and knee ext 5 sec holds Supine manually assisted B hip and knee flexion for LTR's  Leg press R LE 20#, 20 reps Adapted bridge to unilateral bridge per medbridge HEP below     08/28/24:  Nustep Level 5 x 6 min Supine for mobilization with movement , with R hip ER. 3 bouts of 10 reps each with inf/lat glides R prox femur Prone for deep soft tissue massage R gluteus medius, piriformis Supine  for R hip distraction with end range stretch with hip in open pack position  Reviewed home program of bridging and isometric hip abd/ER Added cat cows to home routine  08/23/24:  Evaluation, education in anatomy of hip, spine, surrounding structures Lateral glides of R prox femur, combined with R hip ER, mob with movement, with improvements in pain free ROM R hip flex and ER Instructed in the ex as described below    PATIENT EDUCATION:  Education details: POC, goals Person educated: Patient Education method: Explanation, Demonstration, Tactile cues, Verbal cues, and Handouts Education comprehension: verbalized understanding  HOME EXERCISE PROGRAM: Access Code: [email or web address] URL: [email or web address] Date: 08/30/2024 Prepared by: Greig Speaks  Exercises - Hooklying Isometric Hip Abduction with Belt  - 1 x daily - 7 x weekly - 10 reps - 5 hold - Single Leg Bridge  - 1 x daily - 7 x weekly - 3 sets - 10 reps Access Code: [email or web address] URL: [email or web address] Date: 08/23/2024 Prepared by: Greig Credit  Exercises - Bridge on BOSU  - 1 x daily - 3 x weekly - 3 sets - 10 reps - Hooklying Isometric Hip Abduction with Belt  - 1 x daily - 7 x weekly - 10 reps - 5 hold Access Code: [email or web address] URL: [email or web address] Date: 10/13/2024 Prepared by: Ozell Mainland  Exercises - Supine Piriformis Stretch Pulling Heel to Hip  - 2 x daily - 7 x weekly - 1 sets - 5 reps - 30 hold - Supine Hip Adductor Stretch  - 2 x daily - 7 x weekly - 1 sets - 5 reps - 30 hold ASSESSMENT:  CLINICAL IMPRESSION: Continued Session focus on core engagement and stab with ex and she did well but some cuing needed.Overall she tolerated well. Goals assessed and documented.    Eval: Patient is a 73 y.o. female  who was evaluated today by physical therapy  for R hip pain. She presents with tenderness R lateral, posterior hip musculature, weakness R hip for ER , extension, and  flexion, pain with scour test R hip, some loss of ROM R hip as compared to L particularly for flexion and ER.  Her lumbar ROM is wfl.  She responded positively to R hip jt/ capsular mobilization today, we also initiated isometric ex for her hip musculature. She should benefit from skilled PT to address her deficits and improve her Sx.  She returns to her PCP in 4 weeks, and if she continues with pain after 2-3 weeks of PT might contact her MD about possible x ray.    OBJECTIVE IMPAIRMENTS: decreased activity tolerance, decreased balance, decreased endurance, decreased  knowledge of condition, difficulty walking, decreased ROM, decreased strength, impaired flexibility, postural dysfunction, and pain.   ACTIVITY LIMITATIONS: carrying, standing, sleeping, stairs, locomotion level, and caring for others  PARTICIPATION LIMITATIONS: meal prep, shopping, community activity, and yard work  PERSONAL FACTORS: Age, Behavior pattern, Time since onset of injury/illness/exacerbation, and 1-2 comorbidities: osteopenia,aortic stenosis  are also affecting patient's functional outcome.   REHAB POTENTIAL: Good  CLINICAL DECISION MAKING: Evolving/moderate complexity  EVALUATION COMPLEXITY: Moderate   GOALS: Goals reviewed with patient? Yes  SHORT TERM GOALS: Target date: 2 weeks, 09/06/24 I HEP Baseline:initiated today Goal status: Met 10/13/24   LONG TERM GOALS: Target date:extended another 8 weeks,  01/30/24  Pt able to climb 8 steps or complete 8 forward step ups with R LE on 8 step , without pain R hip Baseline:  Goal status: INITIAL 10/09/24 some pain not met yet; Still with pain 10/27/24, IN PROGRESS able to do with some pain still 11/06/24; Goal MET 1 trial of steps w/o pain 12/04/24  2.  Single leg stance R Le greater than 30 sec, without pain R hip Baseline: p! 20 sec Goal status: INITIAL 10/09/24; not assessed today; RLE 30s LLE 13s 10/27/24, progressing 11/01/24; RLE 16s LLE 30s+ 11/06/24; GOAL  MET 12/04/24  3.  Strength R hip all planes improve to 4+ to 5/5 Baseline: see above chart  Goal status: progress, see chart; IN PROGRESS add/abd 5/5 11/06/24; ONGOING 12/04/24  and 12/12/24  4.  ROM R hip improve to normal Faber's position, normal flexion, Er  Baseline:  Goal status: 10/09/24: provocative, ongoing 11/01/24; Still provocative 11/06/24; ONGOING 12 /15/25 slight pain and 12/12/24    PLAN:  PT FREQUENCY: 2x/week  PT DURATION: 8 weeks  PLANNED INTERVENTIONS: 97110-Therapeutic exercises, 97530- Therapeutic activity, 97112- Neuromuscular re-education, 97535- Self Care, 02859- Manual therapy, 97033- Ionotophoresis 4mg /ml Dexamethasone, and Joint mobilization  PLAN FOR NEXT SESSION: core stabilization Anaalicia Reimann,ANGIE, PTA 12/12/2024, 8:41 AM [facility] [address] [facility] [place], [place], [address] Phone: [phone]   Fax:  [phone]  "

## 2024-12-12 NOTE — Telephone Encounter (Signed)
 Okay to refill? Last labs were 09/26/24

## 2024-12-13 ENCOUNTER — Other Ambulatory Visit: Payer: Self-pay | Admitting: Family Medicine

## 2024-12-13 DIAGNOSIS — F32A Depression, unspecified: Secondary | ICD-10-CM

## 2024-12-19 ENCOUNTER — Ambulatory Visit

## 2024-12-19 DIAGNOSIS — M25551 Pain in right hip: Secondary | ICD-10-CM | POA: Diagnosis not present

## 2024-12-19 DIAGNOSIS — R262 Difficulty in walking, not elsewhere classified: Secondary | ICD-10-CM

## 2024-12-19 NOTE — Therapy (Addendum)
 " OUTPATIENT PHYSICAL THERAPY LOWER EXTREMITY      Patient Name: Dana Bryan MRN: [id number] DOB:[birth year], 73 y.o., female Today's Date: 12/19/2024  END OF SESSION:  PT End of Session - 12/19/24 1708     Visit Number 14    Date for Recertification  01/29/25    Authorization Type [employer]    PT Start Time 1545    PT Stop Time 1625    PT Time Calculation (min) 40 min    Activity Tolerance Patient tolerated treatment well    Behavior During Therapy WFL for tasks assessed/performed           Past Medical History:  Diagnosis Date   Anxiety    on meds   Basal cell carcinoma    Bicuspid aortic valve    Cancer (HCC)    Cataract    no sx    Generalized headaches    Heart murmur    per Dr.Tabori   Hyperlipidemia    on meds   Pneumonia    Reactive depression (situational)    on meds   Squamous cell skin cancer, finger    Vasomotor rhinitis    Past Surgical History:  Procedure Laterality Date   BUBBLE STUDY  09/04/2022   Procedure: BUBBLE STUDY;  Surgeon: Barbaraann Darryle Ned, MD;  Location: [facility] ENDOSCOPY;  Service: Cardiovascular;;   COLONOSCOPY  2008   normal    COLONOSCOPY W/ POLYPECTOMY  2021   [facility] at [facility], TA and SSPs, 3 yr recall   COLONOSCOPY WITH PROPOFOL   11/30/2023   armbruster   TEE WITHOUT CARDIOVERSION N/A 09/04/2022   Procedure: TRANSESOPHAGEAL ECHOCARDIOGRAM (TEE);  Surgeon: Barbaraann Darryle Ned, MD;  Location: [facility] ENDOSCOPY;  Service: Cardiovascular;  Laterality: N/A;   TONSILLECTOMY  1973   Patient Active Problem List   Diagnosis Date Noted   Electrolyte abnormality 11/10/2022   Aortic stenosis 07/23/2022   LBBB (left bundle branch block) 06/14/2022   Lichen sclerosus 01/07/2022   Osteopenia 09/09/2021   Heart murmur 01/23/2020   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   Routine health maintenance 08/06/2011   B12 deficiency 07/18/2008   Hyperlipidemia 02/15/2008   Anxiety and depression 02/15/2008    PCP: Mahlon Crank,  MD  REFERRING PROVIDER: same  REFERRING DIAG: R hip pain  THERAPY DIAG:  Pain in right hip  Difficulty in walking, not elsewhere classified  Rationale for Evaluation and Treatment: Rehabilitation  ONSET DATE:  6 months , March 2025   SUBJECTIVE STATEMENT:  Pt stated overall she feels good. Pt reports low back pain and hip pain has been the same from last session; doesn't stop her from completing tasks but stiffness is present.    10/18/24  responded well to the kinesiotaping and to the deep tissue massage.  Still pain with stair climbing, points to TFL as site of pain Eval:Reports 6 month h/o R hip pain.  Did take the steroid dose pack prescribed by MD, no improvement.  Pain the most prevalent with climbing steps, and with attempting to sleep, not painful or stiff upon waking  PERTINENT HISTORY: No known trauma, referred to PT from PCP PAIN:  Are you having pain? Yes: NPRS scale: 1 to 8  Pain location: primarily R lat hip and ant thigh, knee, sometimes medial thigh into groin  Pain description: deep pain, ache Aggravating factors: lying down, turning over, stair climbing Relieving factors: lies prone   PRECAUTIONS: None  RED FLAGS: None   WEIGHT BEARING RESTRICTIONS: No  FALLS:  Has patient  fallen in last 6 months? No  LIVING ENVIRONMENT: Lives with: lives with their spouse Lives in: House/apartment Stairs: 2 outdoor steps at her home, at daughter's home has 2 flights of steps  Has following equipment at home: None  OCCUPATION: retired  PLOF: Independent  PATIENT GOALS: be able to sleep without pain and be able to walk better  NEXT MD VISIT: 09/26/24  OBJECTIVE:  Note: Objective measures were completed at Evaluation unless otherwise noted.  DIAGNOSTIC FINDINGS: NONE   PATIENT SURVEYS:  Lower Extremity Functional Score: 50 / 80 = 62.5 % Lower Extremity Functional Score: 51 / 80 = 63.7 %  COGNITION: Overall cognitive status: Within functional limits  for tasks assessed     SENSATION: WFL  EDEMA:  None noted  MUSCLE LENGTH: Hamstrings: Right wfl deg; Left wfl deg [name]: Right wnl deg; Left -5 deg  POSTURE: rounded shoulders and decreased lumbar lordosis  PALPATION: Very tender R gluteals, piriformis, post hip jt   10/09/24: pain R obturator, R piriformis, R glut medius  LOWER EXTREMITY ROM:  AA ROM Right eval Left eval 10/09/24:  R  Hip flexion 100  wnl  Hip extension     Hip abduction 30 45 30 with R groin pain  Hip adduction     Hip internal rotation -10 -10   Hip external rotation 15 25 15  with R groin and R post/lat hip pain  Knee flexion     Knee extension     Ankle dorsiflexion     Ankle plantarflexion     Ankle inversion     Ankle eversion      (Blank rows = not tested)  LOWER EXTREMITY MMT:  MMT Right eval Left eval 10/09/24 R  Hip flexion 4+p!    Hip extension lock bridge  4 5   Hip abduction 4+ 4+ 5  Hip adduction     Hip internal rotation     Hip external rotation 3+ p! wnl 3+ /5 with pain R groin and lat hip  Knee flexion     Knee extension 4p! R lat hip    Ankle dorsiflexion     Ankle plantarflexion     Ankle inversion     Ankle eversion      (Blank rows = not tested)  LOWER EXTREMITY SPECIAL TESTS:  Hip special tests: Belvie (FABER) test: negative, Thomas test: negative, and Hip scouring test: positive   FUNCTIONAL TESTS:  10/09/24: forward step ups on 8 step 10 reps no pain  Improved control and baseline pain per pt with theraband wrap as make shift SI belt with the forward step ups Single leg stance: R 20 sec , L greater than 30, pain R lat hip  GAIT: Distance walked: in clinic Assistive device utilized: None Level of assistance: Complete Independence Comments: no specific antalgia or deficits noted   10/20: mild R laterla pelvic shift noted in R weight bearing.  TREATMENT DATE:  12/19/24 SB Rollouts  Nustep L5 x 6 min  Resisted gait 4 way 20# SLR supine 1.5# Glute Bridges with hip abd Rtband 2x10 Sidelying hip abd 1.5# 2x10 Trunk ext B tband 2x10 Cable Pulldowns 20# 2x10 Lateral band walking Rtband   12/12/24 Nustep L 5 Resisted gait 4 ways 5 x each 30# 6# dead lift into upright row 2 sets 10 STS with wt ball chest press 10 x, then OH 10 x Wall sit with clams 15 x Wall sit alt marching 20 x Wall sit alt LAQ 20 x Black tband trunk flex and ext 20 x each Lat Pull down 2 sets 10 25# Seated row 2 sets 10 25# Core stab ex 10 min supine    12/07/24 Nustep L 5 STS with wt ball 10 x with chest press then 10x with OH Step up with opp leg ext 6 in 10 x each  Cable pulleys 10# shld ext and rows 2 sets 10 each Feet on ball bridge, KTC,obl  Isometric abdominals hold 3 sec 15 x Bridge with ball btwn knee 10 x Bridge with clams and green tband 10 x Gtband single leg clams 10x each Gtband alt marching 20 x 2 sets 4 way pelvic ROM on dyna disc 10 x each , then LAQ and hip flexion on disc with green tband Black tband trunk flexion and ext 2 sets 10 Leg press 30# 2 sets 10   12/04/24 NuStep L5 x 6 min  Reassessment of goals Stability ball rollouts for low back Glute bridges 2x10 Trunk ext Btband 2x10 Standing rows Gtband 2x10 Cable pulldowns 20# 2x10 Step Ups 8 2x10 Lateral band walks R tband Hip 3 way Gtband   11/06/24 -Reassessment of goals -NuStep L5x70min -Red resistance band Clam Shells  -Glute Bridges -Step Ups 6 -Resisted gait 20# (lateral walking)  -Iontophoresis patch placement #3- not billed  11/01/24 Nustep level 5 x 6 minutes Leg press 20#, tried single legs some discomfort in the quads On airex 5# straight arm pulls, cues for posture and core activation Feet on ball K2C, rotation, bridge, isometric abs Ball b/n kness squeeze Blue tband clamshells PAssive stretch LE's Manual sheet  traction Ionto 80mA dose to the right GT area, did not charge due to her insurance not covering  10/30/24 Nustep level 5 x 6 minutes Leg curls 20# 5# leg ext 2.5# hip abduction right lateral hip pain 2.5# hip extension Feet on ball K2C, rotation, bridge, isometric abs Passive stretch right HS, piriformis and adductors STM with the Tgun to the right ITB and right glutes Manual sheet traction I did apply an ionto patch with 80mA dose with dex to the right GT area, did not charge due to her insurance   10/27/24: Nustep L5x5min -Hamstring curls seated w green band 2x10 -Hip adduction with ball 2x10 -Hip abduction 2x10 green band  -glute bridge 2x10 -Step Ups 2x10 6 -STM R hip   10/18/24:  Nustep level 5 5 min Side lying on L for lumbar extension, rot. SB restriction muscle energy Side lying L for deep cross friction massage lateral and posterior R hip musculature with theragun Kinesiotaping R hip 2 I pieces extending from lateral thigh to mid lumbar to assisted R hip abductors, Er stabilizers.   10/13/24 Nustep level 5 x 5 minutes Leg press 20# 2x10 HS curls 20# 2x10 Feet on ball K2C, rotation, small bridge, isometric abs Red tband clamshells Ball b/n knees squeeze Passive stretch HS, piriformis, adductors  and hip flexors STM to the right buttock, ITB and adductor Added to HEP  10/09/24: reassessment:  Instructed in: Seated R gluteal self massage/ with tennis ball  Cat /cow, LTR Kinesiotape R post/ lat hip 2 -I pieces extending from R lateral trochanter to lumbar region and second strip from lateral trochanter to sciatic notch   08/30/24:  Nustep L 5 ,6 min In supine R hip ER provokes R lat hip pain and R groin pain In side lying L, R clam shell also provoked R groin pain  Manual: supine R hip distraction with thrust x 3, with minimal reduction in R groin pain with provoking movements Prone for deep soft tissue myofascial release, massage R piriformis with  theragun Supine for contract relax for alt resisted hip and knee ext 5 sec holds Supine manually assisted B hip and knee flexion for LTR's  Leg press R LE 20#, 20 reps Adapted bridge to unilateral bridge per medbridge HEP below     08/28/24:  Nustep Level 5 x 6 min Supine for mobilization with movement , with R hip ER. 3 bouts of 10 reps each with inf/lat glides R prox femur Prone for deep soft tissue massage R gluteus medius, piriformis Supine for R hip distraction with end range stretch with hip in open pack position  Reviewed home program of bridging and isometric hip abd/ER Added cat cows to home routine  08/23/24:  Evaluation, education in anatomy of hip, spine, surrounding structures Lateral glides of R prox femur, combined with R hip ER, mob with movement, with improvements in pain free ROM R hip flex and ER Instructed in the ex as described below    PATIENT EDUCATION:  Education details: POC, goals Person educated: Patient Education method: Explanation, Demonstration, Tactile cues, Verbal cues, and Handouts Education comprehension: verbalized understanding  HOME EXERCISE PROGRAM: Access Code: [email or web address] URL: [email or web address] Date: 08/30/2024 Prepared by: Greig Speaks  Exercises - Hooklying Isometric Hip Abduction with Belt  - 1 x daily - 7 x weekly - 10 reps - 5 hold - Single Leg Bridge  - 1 x daily - 7 x weekly - 3 sets - 10 reps Access Code: [email or web address] URL: [email or web address] Date: 08/23/2024 Prepared by: Greig Credit  Exercises - Bridge on BOSU  - 1 x daily - 3 x weekly - 3 sets - 10 reps - Hooklying Isometric Hip Abduction with Belt  - 1 x daily - 7 x weekly - 10 reps - 5 hold Access Code: [email or web address] URL: [email or web address] Date: 10/13/2024 Prepared by: Ozell Mainland  Exercises - Supine Piriformis Stretch Pulling Heel to Hip  - 2 x daily - 7 x weekly - 1 sets - 5 reps - 30 hold - Supine Hip Adductor Stretch  -  2 x daily - 7 x weekly - 1 sets - 5 reps - 30 hold ASSESSMENT:  CLINICAL IMPRESSION:  Session focused on stretching to mitigate stiffness in low back. Pt able to tolerate resistance exercises and slightly weighted exercise. Pt will benefit from activity that requires weight shifting and SL stance time like stair training to increase LE strength and upright tolerance.   Eval: Patient is a 73 y.o. female  who was evaluated today by physical therapy  for R hip pain. She presents with tenderness R lateral, posterior hip musculature, weakness R hip for ER , extension, and flexion, pain with scour test R hip, some loss of ROM R hip as compared to L particularly for flexion and ER.  Her lumbar ROM  is wfl.  She responded positively to R hip jt/ capsular mobilization today, we also initiated isometric ex for her hip musculature. She should benefit from skilled PT to address her deficits and improve her Sx.  She returns to her PCP in 4 weeks, and if she continues with pain after 2-3 weeks of PT might contact her MD about possible x ray.    OBJECTIVE IMPAIRMENTS: decreased activity tolerance, decreased balance, decreased endurance, decreased knowledge of condition, difficulty walking, decreased ROM, decreased strength, impaired flexibility, postural dysfunction, and pain.   ACTIVITY LIMITATIONS: carrying, standing, sleeping, stairs, locomotion level, and caring for others  PARTICIPATION LIMITATIONS: meal prep, shopping, community activity, and yard work  PERSONAL FACTORS: Age, Behavior pattern, Time since onset of injury/illness/exacerbation, and 1-2 comorbidities: osteopenia,aortic stenosis  are also affecting patient's functional outcome.   REHAB POTENTIAL: Good  CLINICAL DECISION MAKING: Evolving/moderate complexity  EVALUATION COMPLEXITY: Moderate   GOALS: Goals reviewed with patient? Yes  SHORT TERM GOALS: Target date: 2 weeks, 09/06/24 I HEP Baseline:initiated today Goal status: Met  10/13/24   LONG TERM GOALS: Target date:extended another 8 weeks,  01/30/24  Pt able to climb 8 steps or complete 8 forward step ups with R LE on 8 step , without pain R hip Baseline:  Goal status: INITIAL 10/09/24 some pain not met yet; Still with pain 10/27/24, IN PROGRESS able to do with some pain still 11/06/24; Goal MET 1 trial of steps w/o pain 12/04/24  2.  Single leg stance R Le greater than 30 sec, without pain R hip Baseline: p! 20 sec Goal status: INITIAL 10/09/24; not assessed today; RLE 30s LLE 13s 10/27/24, progressing 11/01/24; RLE 16s LLE 30s+ 11/06/24; GOAL MET 12/04/24  3.  Strength R hip all planes improve to 4+ to 5/5 Baseline: see above chart  Goal status: progress, see chart; IN PROGRESS add/abd 5/5 11/06/24; ONGOING 12/04/24  and 12/12/24  4.  ROM R hip improve to normal Faber's position, normal flexion, Er  Baseline:  Goal status: 10/09/24: provocative, ongoing 11/01/24; Still provocative 11/06/24; ONGOING 12 /15/25 slight pain and 12/12/24; Unchanged 12/19/24    PLAN:  PT FREQUENCY: 2x/week  PT DURATION: 8 weeks  PLANNED INTERVENTIONS: 97110-Therapeutic exercises, 97530- Therapeutic activity, 97112- Neuromuscular re-education, 97535- Self Care, [id number]- Manual therapy, [phone]- Ionotophoresis 4mg /ml Dexamethasone, and Joint mobilization  PLAN FOR NEXT SESSION: core stabilization Almetta Fam, PT 12/19/2024, 5:14 PM [facility] [address] [facility] [place], [place], [address] Phone: [phone]   Fax:  [phone]  "

## 2024-12-22 ENCOUNTER — Ambulatory Visit

## 2025-01-01 ENCOUNTER — Ambulatory Visit: Admitting: Family Medicine

## 2025-01-01 ENCOUNTER — Encounter: Payer: Self-pay | Admitting: Family Medicine

## 2025-01-01 VITALS — BP 132/80 | HR 66 | Ht 63.0 in | Wt 137.5 lb

## 2025-01-01 DIAGNOSIS — F419 Anxiety disorder, unspecified: Secondary | ICD-10-CM | POA: Diagnosis not present

## 2025-01-01 DIAGNOSIS — R7989 Other specified abnormal findings of blood chemistry: Secondary | ICD-10-CM | POA: Insufficient documentation

## 2025-01-01 DIAGNOSIS — R946 Abnormal results of thyroid function studies: Secondary | ICD-10-CM

## 2025-01-01 DIAGNOSIS — F32A Depression, unspecified: Secondary | ICD-10-CM

## 2025-01-01 DIAGNOSIS — E785 Hyperlipidemia, unspecified: Secondary | ICD-10-CM

## 2025-01-01 DIAGNOSIS — R030 Elevated blood-pressure reading, without diagnosis of hypertension: Secondary | ICD-10-CM | POA: Diagnosis not present

## 2025-01-01 LAB — HEPATIC FUNCTION PANEL
ALT: 17 U/L (ref 3–35)
AST: 14 U/L (ref 5–37)
Albumin: 4.2 g/dL (ref 3.5–5.2)
Alkaline Phosphatase: 104 U/L (ref 39–117)
Bilirubin, Direct: 0.1 mg/dL (ref 0.1–0.3)
Total Bilirubin: 0.4 mg/dL (ref 0.2–1.2)
Total Protein: 7.1 g/dL (ref 6.0–8.3)

## 2025-01-01 LAB — CBC WITH DIFFERENTIAL/PLATELET
Basophils Absolute: 0 K/uL (ref 0.0–0.1)
Basophils Relative: 0.2 % (ref 0.0–3.0)
Eosinophils Absolute: 0.1 K/uL (ref 0.0–0.7)
Eosinophils Relative: 2.2 % (ref 0.0–5.0)
HCT: 39.6 % (ref 36.0–46.0)
Hemoglobin: 13.2 g/dL (ref 12.0–15.0)
Lymphocytes Relative: 23.7 % (ref 12.0–46.0)
Lymphs Abs: 1.3 K/uL (ref 0.7–4.0)
MCHC: 33.3 g/dL (ref 30.0–36.0)
MCV: 86 fl (ref 78.0–100.0)
Monocytes Absolute: 0.5 K/uL (ref 0.1–1.0)
Monocytes Relative: 8.8 % (ref 3.0–12.0)
Neutro Abs: 3.6 K/uL (ref 1.4–7.7)
Neutrophils Relative %: 65.1 % (ref 43.0–77.0)
Platelets: 220 K/uL (ref 150.0–400.0)
RBC: 4.61 Mil/uL (ref 3.87–5.11)
RDW: 16.2 % — ABNORMAL HIGH (ref 11.5–15.5)
WBC: 5.6 K/uL (ref 4.0–10.5)

## 2025-01-01 LAB — T3, FREE: T3, Free: 3.1 pg/mL (ref 2.3–4.2)

## 2025-01-01 LAB — T4, FREE: Free T4: 0.79 ng/dL (ref 0.60–1.60)

## 2025-01-01 LAB — LIPID PANEL
Cholesterol: 175 mg/dL (ref 28–200)
HDL: 89.4 mg/dL
LDL Cholesterol: 69 mg/dL (ref 10–99)
NonHDL: 85.32
Total CHOL/HDL Ratio: 2
Triglycerides: 82 mg/dL (ref 10.0–149.0)
VLDL: 16.4 mg/dL (ref 0.0–40.0)

## 2025-01-01 LAB — BASIC METABOLIC PANEL WITH GFR
BUN: 13 mg/dL (ref 6–23)
CO2: 29 meq/L (ref 19–32)
Calcium: 9.6 mg/dL (ref 8.4–10.5)
Chloride: 102 meq/L (ref 96–112)
Creatinine, Ser: 0.75 mg/dL (ref 0.40–1.20)
GFR: 79.79 mL/min
Glucose, Bld: 83 mg/dL (ref 70–99)
Potassium: 4.8 meq/L (ref 3.5–5.1)
Sodium: 137 meq/L (ref 135–145)

## 2025-01-01 LAB — TSH: TSH: 5.38 u[IU]/mL (ref 0.35–5.50)

## 2025-01-01 NOTE — Progress Notes (Unsigned)
" ° °  Subjective:    Patient ID: Dana Bryan, female    DOB: 19-Apr-1951, 74 y.o.   MRN: [id number]  HPI Hyperlipidemia- chronic problem, on Lipitor 20mg  daily.  No abd pain, N/V.  Elevated BP- at last visit BP was elevated but pt felt that it was stress related.  BP normal today.  Pt has been checking BP at home and things have been normal.  No CP.  Some SOB  Elevated TSH- last visit TSH was 5.55.  T3 and T4 were both normal at that time.  No changes to skin/hair/nails.  + fatigue  Anxiety/depression- pt reports increased levels of fatigue and decreased levels of motivation.  Increased sadness.  Currently on Wellbutrin  and Buspar .  Not interested in adding or adjusting medication at this time   Review of Systems For ROS see HPI     Objective:   Physical Exam Vitals reviewed.  Constitutional:      General: She is not in acute distress.    Appearance: Normal appearance. She is well-developed. She is not ill-appearing.  HENT:     Head: Normocephalic and atraumatic.  Eyes:     Conjunctiva/sclera: Conjunctivae normal.     Pupils: Pupils are equal, round, and reactive to light.  Neck:     Thyroid : No thyromegaly.  Cardiovascular:     Rate and Rhythm: Normal rate and regular rhythm.     Heart sounds: Murmur (III/VI SEM at RUSB) heard.  Pulmonary:     Effort: Pulmonary effort is normal. No respiratory distress.     Breath sounds: Normal breath sounds.  Abdominal:     General: There is no distension.     Palpations: Abdomen is soft.     Tenderness: There is no abdominal tenderness.  Musculoskeletal:     Cervical back: Normal range of motion and neck supple.  Lymphadenopathy:     Cervical: No cervical adenopathy.  Skin:    General: Skin is warm and dry.  Neurological:     Mental Status: She is alert and oriented to person, place, and time.  Psychiatric:        Behavior: Behavior normal.           Assessment & Plan:    "

## 2025-01-01 NOTE — Patient Instructions (Signed)
 Follow up in 5 months to recheck cholesterol, thyroid , and mood We'll notify you of your lab results and make any changes if needed Continue to work on healthy diet and regular exercise- you look great! Call with any questions or concerns Stay Safe!  Stay Healthy! Hang in there!!!

## 2025-01-02 ENCOUNTER — Ambulatory Visit: Payer: Self-pay | Admitting: Family Medicine

## 2025-01-02 DIAGNOSIS — R0789 Other chest pain: Secondary | ICD-10-CM

## 2025-01-02 NOTE — Assessment & Plan Note (Signed)
 Deteriorated.  Pt is having a very hard time w/ current state of affairs.  Increased fatigue, decreased motivation.  She is not interested in adjusting meds at this time.  Discussed limiting news consumption, increased time devoted to hobbies or things she finds enjoyable.  Will follow.

## 2025-01-02 NOTE — Assessment & Plan Note (Signed)
 Much improved today.  BP WNL.  Home readings have also been good per pt report.  Will continue to follow.

## 2025-01-02 NOTE — Assessment & Plan Note (Signed)
 New at last visit.  At that time, free T3 and free T4 were both WNL.  Repeat labs today.

## 2025-01-02 NOTE — Assessment & Plan Note (Signed)
Chronic problem.  On Lipitor 20mg daily w/o difficulty.  Check labs.  Adjust meds prn  

## 2025-01-03 NOTE — Telephone Encounter (Signed)
 Pt is okay with referral

## 2025-01-04 ENCOUNTER — Ambulatory Visit: Admitting: Adult Health

## 2025-01-04 ENCOUNTER — Encounter: Payer: Self-pay | Admitting: Adult Health

## 2025-01-04 ENCOUNTER — Ambulatory Visit (INDEPENDENT_AMBULATORY_CARE_PROVIDER_SITE_OTHER)

## 2025-01-04 VITALS — BP 126/76 | HR 84 | Temp 98.0°F | Ht 63.0 in | Wt 136.4 lb

## 2025-01-04 DIAGNOSIS — J90 Pleural effusion, not elsewhere classified: Secondary | ICD-10-CM | POA: Diagnosis not present

## 2025-01-04 DIAGNOSIS — Z87891 Personal history of nicotine dependence: Secondary | ICD-10-CM

## 2025-01-04 DIAGNOSIS — I447 Left bundle-branch block, unspecified: Secondary | ICD-10-CM

## 2025-01-04 DIAGNOSIS — Z8249 Family history of ischemic heart disease and other diseases of the circulatory system: Secondary | ICD-10-CM | POA: Diagnosis not present

## 2025-01-04 DIAGNOSIS — Z8709 Personal history of other diseases of the respiratory system: Secondary | ICD-10-CM

## 2025-01-04 DIAGNOSIS — I350 Nonrheumatic aortic (valve) stenosis: Secondary | ICD-10-CM | POA: Diagnosis not present

## 2025-01-04 DIAGNOSIS — R0789 Other chest pain: Secondary | ICD-10-CM

## 2025-01-04 LAB — BRAIN NATRIURETIC PEPTIDE: Pro B Natriuretic peptide (BNP): 321 pg/mL — ABNORMAL HIGH (ref 1.0–100.0)

## 2025-01-04 LAB — EXTRA SPECIMEN

## 2025-01-04 LAB — TROPONIN I: Troponin I: 13 ng/L

## 2025-01-04 MED ORDER — ALBUTEROL SULFATE HFA 108 (90 BASE) MCG/ACT IN AERS: 1.0000 | INHALATION_SPRAY | Freq: Four times a day (QID) | RESPIRATORY_TRACT | 2 refills | Status: AC | PRN

## 2025-01-04 NOTE — Patient Instructions (Signed)
 Albuterol  inhaler As needed   Refer to cardiology for chronic chest burning with walking/activity  Labs today  Return for PFT  Follow up with Dr Theophilus in 6 weeks and  As needed   Please contact office for sooner follow up if symptoms do not improve or worsen or seek emergency care

## 2025-01-04 NOTE — Progress Notes (Signed)
 "  @Patient  ID: Dana Bryan, female    DOB: [birth year], 74 y.o.   MRN: [id number]  Chief Complaint  Patient presents with   Medical Management of Chronic Issues    Burning in chest w/ exertion    Referring provider: Mahlon Comer BRAVO, MD  HPI: 74 year old female former smoker (heavy smoking history) seen for pulmonary consult during hospitalization November 2023 for community-acquired pneumonia and left pleural effusion (required chest tube)    TEST/EVENTS : Reviewed 01/04/2025  CT chest resolved left-sided loculated pleural fluid with removal pleural drain, minimal left pleural thickening, no pneumothorax CT chest 11/10/2022-small loculated effusion with left lower lobe consolidation CT chest 01/15/2022-decrease in size of loculated effusion, minimal airspace disease in the right upper lobe Chest x-ray 12/01/2022-small loculated left pleural collection Chest x-ray 01/03/2023-scarring volume loss left base, small left pleural effusion    Labs: Pleural fluid studies 11/11/2022 Cytology-no malignancy, active inflammation Cultures-negative     Discussed the use of AI scribe software for clinical note transcription with the patient, who gave verbal consent to proceed.  History of Present Illness Dana Bryan is a 74 year old female with aortic valve stenosis who presents with burning chest discomfort and shortness of breath on exertion.  She experiences burning chest discomfort and shortness of breath during exertion, which began in October while visiting [place] with her grandchildren. The discomfort is described as a burning sensation in the lungs, particularly noticeable during deep breaths and physical activities such as walking from a parking lot or at [facility]. The symptoms are more pronounced in cool weather, although she also occurs on cool days in the shade.  No cough or wheezing is present, but she has experienced hoarseness, which she  attributes to a recent cold. She has a history of smoking but does not have a history of asthma or bronchitis. She mentions having an albuterol  inhaler, although she has not had significant breathing issues requiring its use.  Her medical history includes a significant episode of pneumonia several years ago, requiring hospitalization and drainage of a fluid collection. A CT scan in May 2024 showed resolution of the pneumonia with some residual scarring. She reports full recovery from the pneumonia and did not experience the current symptoms immediately after the pneumonia resolved.  She has a history of aortic valve stenosis, which is monitored annually. She reports that her last echocardiogram in August was stable and that her aortic valve stenosis is being monitored annually. No chest pain, palpitations, or syncope are reported. Her family history includes heart disease, with her mother having had a heart attack and congestive heart failure, and her father's family having a history of strokes and high blood pressure.     Allergies[1]  Immunization History  Administered Date(s) Administered   Fluad Quad(high Dose 65+) 09/10/2020   INFLUENZA, HIGH DOSE SEASONAL PF 09/18/2019, 10/02/2021, 09/21/2022, 09/08/2023   Influenza Whole 10/12/2008, 09/16/2009, 07/28/2012   Influenza,inj,Quad PF,6+ Mos 08/31/2014, 10/26/2017   Influenza-Unspecified 09/28/2013, 10/05/2015, 10/31/2018   PFIZER Comirnaty(Gray Top)Covid-19 Tri-Sucrose Vaccine 04/01/2021   PFIZER(Purple Top)SARS-COV-2 Vaccination 02/11/2020, 03/06/2020, 10/19/2020, 09/26/2022   Pfizer Covid-19 Vaccine Bivalent Booster 65yrs & up 10/02/2021, 05/01/2022   Pfizer Covid-19 Vaccine Bivalent Booster 5y-11y 04/30/2022   Pfizer(Comirnaty)Fall Seasonal Vaccine 12 years and older 10/09/2024   Pneumococcal Conjugate-13 01/06/2019   Pneumococcal Polysaccharide-23 02/07/2021   Respiratory Syncytial Virus Vaccine,Recomb Aduvanted(Arexvy) 09/26/2022,  09/29/2022   Tdap 01/07/2016   Zoster Recombinant(Shingrix) 07/21/2019, 10/21/2019   Zoster, Live 11/08/2014  Past Medical History:  Diagnosis Date   Anxiety    on meds   Basal cell carcinoma    Bicuspid aortic valve    Cancer (HCC)    Cataract    no sx    Generalized headaches    Heart murmur    per Dr.Tabori   Hyperlipidemia    on meds   Pneumonia    Reactive depression (situational)    on meds   Squamous cell skin cancer, finger    Vasomotor rhinitis     Tobacco History: Tobacco Use History[2] Counseling given: Not Answered Tobacco comments: Smoked since 1970 - 2015. Smoked about 2ppd   Outpatient Medications Prior to Visit  Medication Sig Dispense Refill   ALPRAZolam  (XANAX ) 0.25 MG tablet Take 1 tablet (0.25 mg total) by mouth daily as needed. 30 tablet 3   Ascorbic Acid (VITAMIN C PO) Take 500 mg by mouth daily.     atorvastatin  (LIPITOR) 20 MG tablet TAKE 1 TABLET BY MOUTH EVERY DAY 90 tablet 0   buPROPion  (WELLBUTRIN  XL) 300 MG 24 hr tablet TAKE 1 TABLET BY MOUTH EVERY DAY 90 tablet 1   busPIRone  (BUSPAR ) 7.5 MG tablet TAKE 1 TABLET BY MOUTH TWICE A DAY 180 tablet 0   clobetasol cream (TEMOVATE) 0.05 % Apply 1 application  topically 2 (two) times daily as needed (irritation).     ibuprofen  (ADVIL ,MOTRIN ) 200 MG tablet Take 400 mg by mouth daily as needed for headache.     Multiple Vitamins-Minerals (CENTRUM SILVER WOMEN 50+ PO) Take 1 tablet by mouth.     vitamin D3 (CHOLECALCIFEROL) 25 MCG tablet Take by mouth daily.     benzonatate  (TESSALON ) 200 MG capsule Take 1 capsule (200 mg total) by mouth 3 (three) times daily as needed for cough. (Patient not taking: Reported on 01/04/2025) 30 capsule 0   Dextromethorphan-guaiFENesin  (MUCINEX  DM MAXIMUM STRENGTH) 60-1200 MG TB12 Take 1 tablet by mouth 2 (two) times daily. (Patient not taking: Reported on 01/04/2025) 20 tablet 0   fluticasone  (FLONASE ) 50 MCG/ACT nasal spray Place 2 sprays into both nostrils daily. Shake  well before use. Gently blow nose before spraying. Do not blow nose immediately after use. You should not taste the medication or feel it going down your throat; if you do, adjust your technique. (Patient not taking: Reported on 01/04/2025) 16 g 0   Vitamin D , Ergocalciferol , (DRISDOL ) 1.25 MG (50000 UNIT) CAPS capsule Take 1 capsule (50,000 Units total) by mouth every 7 (seven) days. Take in addition to 2,000 units daily OTC Vitamin D  (Patient not taking: Reported on 01/04/2025) 12 capsule 0   No facility-administered medications prior to visit.     Review of Systems:   Constitutional:   No  weight loss, night sweats,  Fevers, chills, +fatigue, or  lassitude.  HEENT:   No headaches,  Difficulty swallowing,  Tooth/dental problems, or  Sore throat,                No sneezing, itching, ear ache, nasal congestion, post nasal drip,   CV:  No chest pain,  Orthopnea, PND, swelling in lower extremities, anasarca, dizziness, palpitations, syncope.   GI  No heartburn, indigestion, abdominal pain, nausea, vomiting, diarrhea, change in bowel habits, loss of appetite, bloody stools.   Resp:    No chest wall deformity  Skin: no rash or lesions.  GU: no dysuria, change in color of urine, no urgency or frequency.  No flank pain, no hematuria   MS:  No  joint pain or swelling.  No decreased range of motion.  No back pain.    Physical Exam  BP 126/76   Pulse 84   Temp 98 F (36.7 C)   Ht 5' 3 (1.6 m) Comment: Per pt  Wt 136 lb 6.4 oz (61.9 kg)   SpO2 96% Comment: RA  BMI 24.16 kg/m   GEN: A/Ox3; pleasant , NAD, well nourished    HEENT:  [place]/AT,   NOSE-clear, THROAT-clear, no lesions, no postnasal drip or exudate noted.   NECK:  Supple w/ fair ROM; no JVD; normal carotid impulses w/o bruits; no thyromegaly or nodules palpated; no lymphadenopathy.    RESP  Clear  P & A; w/o, wheezes/ rales/ or rhonchi. no accessory muscle use, no dullness to percussion  CARD:  RRR, no m/r/g, no peripheral  edema, pulses intact, no cyanosis or clubbing.  GI:   Soft & nt; nml bowel sounds; no organomegaly or masses detected.   Musco: Warm bil, no deformities or joint swelling noted.   Neuro: alert, no focal deficits noted.    Skin: Warm, no lesions or rashes  EKG NSR , Left BBB  , no acute ST changes noted.   Lab Results:Reviewed 01/04/2025   CBC    Component Value Date/Time   WBC 5.6 01/01/2025 0936   RBC 4.61 01/01/2025 0936   HGB 13.2 01/01/2025 0936   HGB 12.9 08/28/2022 0946   HCT 39.6 01/01/2025 0936   HCT 37.4 08/28/2022 0946   PLT 220.0 01/01/2025 0936   PLT 233 08/28/2022 0946   MCV 86.0 01/01/2025 0936   MCV 88 08/28/2022 0946   MCH 29.6 11/18/2022 0813   MCHC 33.3 01/01/2025 0936   RDW 16.2 (H) 01/01/2025 0936   RDW 14.1 08/28/2022 0946   LYMPHSABS 1.3 01/01/2025 0936   MONOABS 0.5 01/01/2025 0936   EOSABS 0.1 01/01/2025 0936   BASOSABS 0.0 01/01/2025 0936    BMET    Component Value Date/Time   NA 137 01/01/2025 0936   NA 140 08/28/2022 0946   K 4.8 01/01/2025 0936   CL 102 01/01/2025 0936   CO2 29 01/01/2025 0936   GLUCOSE 83 01/01/2025 0936   GLUCOSE 99 12/16/2006 0919   BUN 13 01/01/2025 0936   BUN 12 08/28/2022 0946   CREATININE 0.75 01/01/2025 0936   CALCIUM  9.6 01/01/2025 0936   GFRNONAA >60 11/18/2022 0813   GFRAA >60 03/22/2019 0533    BNP No results found for: BNP  ProBNP No results found for: PROBNP  Imaging: DG Chest 2 View Result Date: 01/04/2025 CLINICAL DATA:  Burning in chest.  Possible pleural effusion. EXAM: CHEST - 2 VIEW COMPARISON:  01/01/2023 chest CT 05/04/2023 FINDINGS: Lungs are adequately inflated with mild stable biapical pleural thickening. No focal lobar consolidation or significant pleural effusion. Chronic blunting of the left costophrenic angle compatible with pleuroparenchymal scarring. Cardiomediastinal silhouette and remainder of the exam is unchanged. IMPRESSION: No acute cardiopulmonary disease.  Electronically Signed   By: Toribio Agreste M.D.   On: 01/04/2025 16:09    Administration History     None           No data to display          No results found for: NITRICOXIDE      No data to display              Assessment & Plan:   Assessment and Plan Assessment & Plan Burning chest pain with exertion questionable etiology Intermittent  burning chest pain with exertion, especially in cool weather, has been present since October 2025 . Differential diagnosis includes cardiac versus pulmonary causes.  There is a family history of heart disease. Recent labs are normal essentially unrevealing.  Currently, there are no symptoms of chest pain, palpitations, or syncope. Cold air may trigger airway inflammation. A chest x-ray is ordered to evaluate pulmonary causes, and a pulmonary function test is scheduled to assess lung capacity and rule out asthma or COPD.  EKG today shows normal sinus rhythm with similar left bundle branch block no acute ST changes were noted.  Heart failure marker and troponin levels are ordered to assess cardiac involvement. She is referred to cardiology for further evaluation and a potential stress test if indicated. An albuterol  inhaler is prescribed for potential mild asthma or reactive airways.  Denies reflux or heartburn.  Could consider GERD treatment going forward if indicated . she is advised to avoid prolonged walking or exercise until further evaluation with cardiology.  Follow-up in our office in 6 weeks and as needed   History of pneumonia and pleural effusion, resolved with treatment in 2024. Previous pneumonia with pleural effusion has resolved, leaving residual mild scarring. The last CT scan in May 2024 confirmed resolution of the effusion with residual mild scarring. There are no current respiratory symptoms related to past pneumonia. A chest x-ray is ordered to ensure no recurrence of pleural effusion. Chest x-ray shows no acute  process  History of aortic valve stenosis.  Most recent echo summer 2025 showed stable moderate AV stenosis.  Could be contributing factor for decreased activity tolerance.  Recommend follow-up with cardiology  Family history of heart disease, personal history of hyperlipidemia, age, atherosclerosis noted on previous imaging and coronary CT-recommend referral to cardiology for ongoing workup for possible underlying cardiac etiology Patient is advised if symptoms worsen she is to seek emergency room care immediately.   Plan Patient Instructions  Albuterol  inhaler As needed   Refer to cardiology for chronic chest burning with walking/activity  Labs today  Return for PFT  Follow up with Dr Theophilus in 6 weeks and  As needed   Please contact office for sooner follow up if symptoms do not improve or worsen or seek emergency care          Adelfo Diebel, NP 01/04/2025  I spent  43  minutes dedicated to the care of this patient on the date of this encounter to include pre-visit review of records, face-to-face time with the patient discussing conditions above, post visit ordering of testing, clinical documentation with the electronic health record, making appropriate referrals as documented, and communicating necessary findings to members of the patients care team.      [1]  Allergies Allergen Reactions   Chlorhexidine Gluconate Rash  [2]  Social History Tobacco Use  Smoking Status Former   Current packs/day: 0.00   Types: Cigarettes   Start date: 01/21/1974   Quit date: 01/22/2004   Years since quitting: 20.9  Smokeless Tobacco Never  Tobacco Comments   Smoked since 1970 - 2015. Smoked about 2ppd   "

## 2025-01-05 ENCOUNTER — Ambulatory Visit: Payer: Self-pay | Admitting: Adult Health

## 2025-01-05 ENCOUNTER — Ambulatory Visit: Attending: Family Medicine | Admitting: Physical Therapy

## 2025-01-05 DIAGNOSIS — M25551 Pain in right hip: Secondary | ICD-10-CM | POA: Diagnosis present

## 2025-01-05 MED ORDER — FUROSEMIDE 20 MG PO TABS: 20.0000 mg | ORAL_TABLET | Freq: Every day | ORAL | 0 refills | Status: AC | PRN

## 2025-01-05 NOTE — Therapy (Signed)
 " OUTPATIENT PHYSICAL THERAPY LOWER EXTREMITY      Patient Name: Dana Bryan MRN: [id number] DOB:02-08-1951, 74 y.o., female Today's Date: 01/05/2025  END OF SESSION:  PT End of Session - 01/05/25 0759     Visit Number 15    Date for Recertification  01/29/25    Authorization Type [employer]    PT Start Time 0800    PT Stop Time 0845    PT Time Calculation (min) 45 min           Past Medical History:  Diagnosis Date   Anxiety    on meds   Basal cell carcinoma    Bicuspid aortic valve    Cancer (HCC)    Cataract    no sx    Generalized headaches    Heart murmur    per Dr.Tabori   Hyperlipidemia    on meds   Pneumonia    Reactive depression (situational)    on meds   Squamous cell skin cancer, finger    Vasomotor rhinitis    Past Surgical History:  Procedure Laterality Date   BUBBLE STUDY  09/04/2022   Procedure: BUBBLE STUDY;  Surgeon: Barbaraann Darryle Ned, MD;  Location: [facility] ENDOSCOPY;  Service: Cardiovascular;;   COLONOSCOPY  2008   normal    COLONOSCOPY W/ POLYPECTOMY  2021   [facility] at [facility], TA and SSPs, 3 yr recall   COLONOSCOPY WITH PROPOFOL   11/30/2023   [facility]uster   TEE WITHOUT CARDIOVERSION N/A 09/04/2022   Procedure: TRANSESOPHAGEAL ECHOCARDIOGRAM (TEE);  Surgeon: Barbaraann Darryle Ned, MD;  Location: [facility] ENDOSCOPY;  Service: Cardiovascular;  Laterality: N/A;   TONSILLECTOMY  1973   Patient Active Problem List   Diagnosis Date Noted   Elevated BP reading w/ no diagnosis of HTN 01/01/2025   Elevated TSH 01/01/2025   Electrolyte abnormality 11/10/2022   Aortic stenosis 07/23/2022   LBBB (left bundle branch block) 06/14/2022   Lichen sclerosus 01/07/2022   Osteopenia 09/09/2021   Heart murmur 01/23/2020   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   Routine health maintenance 08/06/2011   B12 deficiency 07/18/2008   Hyperlipidemia 02/15/2008   Anxiety and depression 02/15/2008    PCP: Mahlon Crank, MD  REFERRING PROVIDER:  same  REFERRING DIAG: R hip pain  THERAPY DIAG:  Pain in right hip  Rationale for Evaluation and Treatment: Rehabilitation  ONSET DATE:  6 months , March 2025   SUBJECTIVE STATEMENT:  Alittle pain but not bad. 60% better all. Not doing ex at home- I know I would be better if you did.. Stiffness lateral hip, mainly up and down steps   PERTINENT HISTORY: No known trauma, referred to PT from PCP PAIN:  Are you having pain? Yes: NPRS scale: 1 to 8  Pain location: primarily R lat hip and ant thigh, knee, sometimes medial thigh into groin  Pain description: deep pain, ache Aggravating factors: lying down, turning over, stair climbing Relieving factors: lies prone   PRECAUTIONS: None  RED FLAGS: None   WEIGHT BEARING RESTRICTIONS: No  FALLS:  Has patient fallen in last 6 months? No  LIVING ENVIRONMENT: Lives with: lives with their spouse Lives in: House/apartment Stairs: 2 outdoor steps at her home, at daughter's home has 2 flights of steps  Has following equipment at home: None  OCCUPATION: retired  PLOF: Independent  PATIENT GOALS: be able to sleep without pain and be able to walk better  NEXT MD VISIT: 09/26/24  OBJECTIVE:  Note: Objective measures were completed at Evaluation unless  otherwise noted.  DIAGNOSTIC FINDINGS: NONE   PATIENT SURVEYS:  Lower Extremity Functional Score: 50 / 80 = 62.5 % Lower Extremity Functional Score: 51 / 80 = 63.7 %  COGNITION: Overall cognitive status: Within functional limits for tasks assessed     SENSATION: WFL  EDEMA:  None noted  MUSCLE LENGTH: Hamstrings: Right wfl deg; Left wfl deg [name]: Right wnl deg; Left -5 deg  POSTURE: rounded shoulders and decreased lumbar lordosis  PALPATION: Very tender R gluteals, piriformis, post hip jt   10/09/24: pain R obturator, R piriformis, R glut medius  LOWER EXTREMITY ROM:  AA ROM Right eval Left eval 10/09/24:  R  Hip flexion 100  wnl  Hip extension      Hip abduction 30 45 30 with R groin pain  Hip adduction     Hip internal rotation -10 -10   Hip external rotation 15 25 15  with R groin and R post/lat hip pain  Knee flexion     Knee extension     Ankle dorsiflexion     Ankle plantarflexion     Ankle inversion     Ankle eversion      (Blank rows = not tested)  LOWER EXTREMITY MMT:  MMT Right eval Left eval 10/09/24 R  Hip flexion 4+p!    Hip extension lock bridge  4 5   Hip abduction 4+ 4+ 5  Hip adduction     Hip internal rotation     Hip external rotation 3+ p! wnl 3+ /5 with pain R groin and lat hip  Knee flexion     Knee extension 4p! R lat hip    Ankle dorsiflexion     Ankle plantarflexion     Ankle inversion     Ankle eversion      (Blank rows = not tested)  LOWER EXTREMITY SPECIAL TESTS:  Hip special tests: Belvie (FABER) test: negative, Thomas test: negative, and Hip scouring test: positive   FUNCTIONAL TESTS:  10/09/24: forward step ups on 8 step 10 reps no pain  Improved control and baseline pain per pt with theraband wrap as make shift SI belt with the forward step ups Single leg stance: R 20 sec , L greater than 30, pain R lat hip  GAIT: Distance walked: in clinic Assistive device utilized: None Level of assistance: Complete Independence Comments: no specific antalgia or deficits noted   10/20: mild R laterla pelvic shift noted in R weight bearing.                                                                                                                              TREATMENT DATE:   01/05/25 Assessed goals  Nustep L 5 LE only Access Code: [email or web address] URL: [email or web address] Date: 01/05/2025 Prepared by: Adiel Erney  Exercises - Seated Table Hamstring Stretch  - 2 x daily - 7 x weekly - 1 sets - 5 reps - 20 hold -  Supine Bilateral Adductor Stretch  - 2 x daily - 7 x weekly - 1 sets - 5 reps - 20 hold - Supine Straight-Leg Raise With Resistance at Ankles  - 1 x daily  - 7 x weekly - 1 sets - 15 reps - 3 hold - Supine Single Leg Hip Abduction with Resistance at Ankles  - 1 x daily - 7 x weekly - 1 sets - 15 reps - Supine Bridge with Mini Swiss Ball Between Knees  - 1 x daily - 7 x weekly - 1 sets - 10 reps - 3 hold - Supine Bridge  - 1 x daily - 7 x weekly - 1 sets - 10 reps - 3 hold - Standing Hip Flexion with Resistance Loop  - 1 x daily - 7 x weekly - 1 sets - 15 reps - Hip Extension with Resistance Loop  - 1 x daily - 7 x weekly - 1 sets - 15 reps - Standing Hip Abduction Kicks  - 1 x daily - 7 x weekly - 1 sets - 15 reps 12/19/24 SB Rollouts  Nustep L5 x 6 min  Resisted gait 4 way 20# SLR supine 1.5# Glute Bridges with hip abd Rtband 2x10 Sidelying hip abd 1.5# 2x10 Trunk ext B tband 2x10 Cable Pulldowns 20# 2x10 Lateral band walking Rtband   12/12/24 Nustep L 5 Resisted gait 4 ways 5 x each 30# 6# dead lift into upright row 2 sets 10 STS with wt ball chest press 10 x, then OH 10 x Wall sit with clams 15 x Wall sit alt marching 20 x Wall sit alt LAQ 20 x Black tband trunk flex and ext 20 x each Lat Pull down 2 sets 10 25# Seated row 2 sets 10 25# Core stab ex 10 min supine    12/07/24 Nustep L 5 STS with wt ball 10 x with chest press then 10x with OH Step up with opp leg ext 6 in 10 x each  Cable pulleys 10# shld ext and rows 2 sets 10 each Feet on ball bridge, KTC,obl  Isometric abdominals hold 3 sec 15 x Bridge with ball btwn knee 10 x Bridge with clams and green tband 10 x Gtband single leg clams 10x each Gtband alt marching 20 x 2 sets 4 way pelvic ROM on dyna disc 10 x each , then LAQ and hip flexion on disc with green tband Black tband trunk flexion and ext 2 sets 10 Leg press 30# 2 sets 10   12/04/24 NuStep L5 x 6 min  Reassessment of goals Stability ball rollouts for low back Glute bridges 2x10 Trunk ext Btband 2x10 Standing rows Gtband 2x10 Cable pulldowns 20# 2x10 Step Ups 8 2x10 Lateral band walks  R tband Hip 3 way Gtband   11/06/24 -Reassessment of goals -NuStep L5x32min -Red resistance band Clam Shells  -Glute Bridges -Step Ups 6 -Resisted gait 20# (lateral walking)  -Iontophoresis patch placement #3- not billed  11/01/24 Nustep level 5 x 6 minutes Leg press 20#, tried single legs some discomfort in the quads On airex 5# straight arm pulls, cues for posture and core activation Feet on ball K2C, rotation, bridge, isometric abs Ball b/n kness squeeze Blue tband clamshells PAssive stretch LE's Manual sheet traction Ionto 80mA dose to the right GT area, did not charge due to her insurance not covering  10/30/24 Nustep level 5 x 6 minutes Leg curls 20# 5# leg ext 2.5# hip abduction right lateral hip pain 2.5#  hip extension Feet on ball K2C, rotation, bridge, isometric abs Passive stretch right HS, piriformis and adductors STM with the Tgun to the right ITB and right glutes Manual sheet traction I did apply an ionto patch with 80mA dose with dex to the right GT area, did not charge due to her insurance   10/27/24: Nustep L5x5min -Hamstring curls seated w green band 2x10 -Hip adduction with ball 2x10 -Hip abduction 2x10 green band  -glute bridge 2x10 -Step Ups 2x10 6 -STM R hip   10/18/24:  Nustep level 5 5 min Side lying on L for lumbar extension, rot. SB restriction muscle energy Side lying L for deep cross friction massage lateral and posterior R hip musculature with theragun Kinesiotaping R hip 2 I pieces extending from lateral thigh to mid lumbar to assisted R hip abductors, Er stabilizers.   10/13/24 Nustep level 5 x 5 minutes Leg press 20# 2x10 HS curls 20# 2x10 Feet on ball K2C, rotation, small bridge, isometric abs Red tband clamshells Ball b/n knees squeeze Passive stretch HS, piriformis, adductors and hip flexors STM to the right buttock, ITB and adductor Added to HEP  10/09/24: reassessment:  Instructed in: Seated R gluteal self massage/  with tennis ball  Cat /cow, LTR Kinesiotape R post/ lat hip 2 -I pieces extending from R lateral trochanter to lumbar region and second strip from lateral trochanter to sciatic notch   08/30/24:  Nustep L 5 ,6 min In supine R hip ER provokes R lat hip pain and R groin pain In side lying L, R clam shell also provoked R groin pain  Manual: supine R hip distraction with thrust x 3, with minimal reduction in R groin pain with provoking movements Prone for deep soft tissue myofascial release, massage R piriformis with theragun Supine for contract relax for alt resisted hip and knee ext 5 sec holds Supine manually assisted B hip and knee flexion for LTR's  Leg press R LE 20#, 20 reps Adapted bridge to unilateral bridge per medbridge HEP below     08/28/24:  Nustep Level 5 x 6 min Supine for mobilization with movement , with R hip ER. 3 bouts of 10 reps each with inf/lat glides R prox femur Prone for deep soft tissue massage R gluteus medius, piriformis Supine for R hip distraction with end range stretch with hip in open pack position  Reviewed home program of bridging and isometric hip abd/ER Added cat cows to home routine  08/23/24:  Evaluation, education in anatomy of hip, spine, surrounding structures Lateral glides of R prox femur, combined with R hip ER, mob with movement, with improvements in pain free ROM R hip flex and ER Instructed in the ex as described below    PATIENT EDUCATION:  Education details: POC, goals Person educated: Patient Education method: Explanation, Demonstration, Tactile cues, Verbal cues, and Handouts Education comprehension: verbalized understanding  HOME EXERCISE PROGRAM: Access Code: [email or web address] URL: [email or web address] Date: 08/30/2024 Prepared by: Greig Speaks  Exercises - Hooklying Isometric Hip Abduction with Belt  - 1 x daily - 7 x weekly - 10 reps - 5 hold - Single Leg Bridge  - 1 x daily - 7 x weekly - 3 sets - 10 reps Access  Code: [email or web address] URL: [email or web address] Date: 08/23/2024 Prepared by: Greig Credit  Exercises - Bridge on BOSU  - 1 x daily - 3 x weekly - 3 sets - 10 reps - Hooklying Isometric Hip Abduction with Belt  - 1 x daily - 7 x  weekly - 10 reps - 5 hold Access Code: [email or web address] URL: [email or web address] Date: 10/13/2024 Prepared by: Ozell Mainland  Exercises - Supine Piriformis Stretch Pulling Heel to Hip  - 2 x daily - 7 x weekly - 1 sets - 5 reps - 30 hold - Supine Hip Adductor Stretch  - 2 x daily - 7 x weekly - 1 sets - 5 reps - 30 hold ASSESSMENT:  CLINICAL IMPRESSION: Alittle pain but not bad. 60% better all. Not doing ex at home- I know I would be better if you did.. Stiffness lateral hip, mainly up and down steps. Assessed goals and all goals met.Focus session on establishing cohesive HEP for stretching and strength to encourage compliance.  Eval: Patient is a 74 y.o. female  who was evaluated today by physical therapy  for R hip pain. She presents with tenderness R lateral, posterior hip musculature, weakness R hip for ER , extension, and flexion, pain with scour test R hip, some loss of ROM R hip as compared to L particularly for flexion and ER.  Her lumbar ROM is wfl.  She responded positively to R hip jt/ capsular mobilization today, we also initiated isometric ex for her hip musculature. She should benefit from skilled PT to address her deficits and improve her Sx.  She returns to her PCP in 4 weeks, and if she continues with pain after 2-3 weeks of PT might contact her MD about possible x ray.    OBJECTIVE IMPAIRMENTS: decreased activity tolerance, decreased balance, decreased endurance, decreased knowledge of condition, difficulty walking, decreased ROM, decreased strength, impaired flexibility, postural dysfunction, and pain.   ACTIVITY LIMITATIONS: carrying, standing, sleeping, stairs, locomotion level, and caring for others  PARTICIPATION LIMITATIONS:  meal prep, shopping, community activity, and yard work  PERSONAL FACTORS: Age, Behavior pattern, Time since onset of injury/illness/exacerbation, and 1-2 comorbidities: osteopenia,aortic stenosis  are also affecting patient's functional outcome.   REHAB POTENTIAL: Good  CLINICAL DECISION MAKING: Evolving/moderate complexity  EVALUATION COMPLEXITY: Moderate   GOALS: Goals reviewed with patient? Yes  SHORT TERM GOALS: Target date: 2 weeks, 09/06/24 I HEP Baseline:initiated today Goal status: Met 10/13/24   LONG TERM GOALS: Target date:extended another 8 weeks,  01/30/24  Pt able to climb 8 steps or complete 8 forward step ups with R LE on 8 step , without pain R hip Baseline:  Goal status: INITIAL 10/09/24 some pain not met yet; Still with pain 10/27/24, IN PROGRESS able to do with some pain still 11/06/24; Goal MET 1 trial of steps w/o pain 12/04/24  2.  Single leg stance R Le greater than 30 sec, without pain R hip Baseline: p! 20 sec Goal status: INITIAL 10/09/24; not assessed today; RLE 30s LLE 13s 10/27/24, progressing 11/01/24; RLE 16s LLE 30s+ 11/06/24; GOAL MET 12/04/24  3.  Strength R hip all planes improve to 4+ to 5/5 Baseline: see above chart  Goal status: progress, see chart; IN PROGRESS add/abd 5/5 11/06/24; ONGOING 12/04/24  and 12/12/24 01/05/25 MET  4.  ROM R hip improve to normal Faber's position, normal flexion, Er  Baseline:  Goal status: 10/09/24: provocative, ongoing 11/01/24; Still provocative 11/06/24; ONGOING 12 /15/25 slight pain and 12/12/24; Unchanged 12/19/24 MET 01/05/25    PLAN:  PT FREQUENCY: 2x/week  PT DURATION: 8 weeks  PLANNED INTERVENTIONS: 97110-Therapeutic exercises, 97530- Therapeutic activity, 97112- Neuromuscular re-education, 97535- Self Care, 02859- Manual therapy, 97033- Ionotophoresis 4mg /ml Dexamethasone, and Joint mobilization  PLAN FOR NEXT SESSION: prepare for D/C after assuring compliance with new  HEP and still feeling  good. Genesi Stefanko,ANGIE, PTA 01/05/2025, 8:00 AM [place] [facility] [address] [facility] [place], [place], [address] Phone: [phone]   Fax:  [phone]  "

## 2025-01-09 ENCOUNTER — Ambulatory Visit: Admitting: Physical Therapy

## 2025-01-11 ENCOUNTER — Ambulatory Visit: Admitting: Physical Therapy

## 2025-01-16 ENCOUNTER — Ambulatory Visit: Admitting: Physical Therapy

## 2025-01-18 ENCOUNTER — Ambulatory Visit: Admitting: Physical Therapy

## 2025-01-21 NOTE — Progress Notes (Unsigned)
 " Cardiology Office Note:  .   Date:  01/23/2025  ID:  Dana Bryan, DOB June 20, 1951, MRN [id number] PCP: Mahlon Comer BRAVO, MD  [facility] Providers Cardiologist:  Darryle ONEIDA Decent, MD   History of Present Illness: .    Chief Complaint  Patient presents with   Follow-up    Dana Bryan is a 74 y.o. female with below history who presents for follow-up.   History of Present Illness   Dana Bryan is a 74 year old female with aortic stenosis and nonobstructive coronary artery disease who presents with increased shortness of breath and chest discomfort. She was referred by Dr. Mahlon to evaluate her pulmonary status.  She experiences increased shortness of breath and chest discomfort, described as a burning sensation, with minimal exertion such as walking or making the bed. These symptoms have progressively worsened since January. She attempted to walk with her daughter in January but had to stop due to chest pain. She also experienced chest discomfort while changing bed sheets recently. No fever, chills, or leg swelling. She reports feeling anxious and notes that walking uphill can make her feel winded.  She has a history of aortic stenosis with a pressure gradient that has increased over time, from 18 mmHg in 2023 to 31 mmHg recently. She underwent a CT scan of her arteries in 2023, which showed no significant blockages or plaque buildup, only minor calcium  deposits.  She is currently taking Lipitor 20 mg, with her most recent LDL at 25, which is at goal. She frequently travels to [place] to care for her grandchildren, aged 97 and five.           Problem List LBBB T chol 175, HDL 89, LDL 69, TG 82 Aortic stenosis/Bicuspid aortic valve  -moderate AS 07/2024 Non-obstructive CAD -minimal CAD CCTA 07/13/2022 -coronary calcium  27 (58th percentile)     ROS: All other ROS reviewed and negative. Pertinent positives noted in the HPI.      Studies Reviewed: SABRA   EKG Interpretation Date/Time:  Tuesday January 23 2025 15:13:22 EST Ventricular Rate:  82 PR Interval:  178 QRS Duration:  144 QT Interval:  406 QTC Calculation: 474 R Axis:   11  Text Interpretation: Normal sinus rhythm Left bundle branch block Confirmed by Decent Darryle [phone]) on 01/23/2025 3:17:39 PM   CCTA 07/13/2022 IMPRESSION: 1. Minimal nonobstructive CAD, CADRADS = 1. Mixed calcified and noncalcified plaque in proximal portions of all vessels, maximum 1-24% stenosis.   2. Coronary calcium  score of 27. This was 58th percentile for age and sex matched control.   3. Normal coronary origin with right dominance.   4.  Bicuspid aortic valve.   5. Incomplete opacification of distal left atrial appendage, cannot exclude thrombus.   6.  Aortic atherosclerosis.  TTE 08/15/2024  1. Left ventricular ejection fraction, by estimation, is 60 to 65%. Left  ventricular ejection fraction by PLAX is 61 %. The left ventricle has  normal function. The left ventricle has no regional wall motion  abnormalities. There is mild concentric left  ventricular hypertrophy. Left ventricular diastolic parameters are  consistent with Grade I diastolic dysfunction (impaired relaxation).   2. Right ventricular systolic function is normal. The right ventricular  size is normal. There is normal pulmonary artery systolic pressure.   3. The mitral valve is normal in structure. Trivial mitral valve  regurgitation. No evidence of mitral stenosis.   4. 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 There is  moderate  calcification of the aortic valve. There is moderate thickening of the  aortic valve. Aortic valve regurgitation is mild. Moderate aortic valve  stenosis. Aortic valve area, by VTI measures  0.68 cm. Aortic valve mean gradient measures 31.0 mmHg. Aortic valve Vmax  measures 3.48 m/s.   5. The inferior vena cava is normal in size with greater than 50%  respiratory  variability, suggesting right atrial pressure of 3 mmHg.  Physical Exam:   VS:  BP (!) 148/88   Pulse 78   Ht 5' 3 (1.6 m)   Wt 138 lb 12.8 oz (63 kg)   SpO2 96%   BMI 24.59 kg/m    Wt Readings from Last 3 Encounters:  01/23/25 138 lb 12.8 oz (63 kg)  01/04/25 136 lb 6.4 oz (61.9 kg)  01/01/25 137 lb 8 oz (62.4 kg)    GEN: Well nourished, well developed in no acute distress NECK: No JVD; No carotid bruits CARDIAC: RRR, 3/6 SEM, rubs, gallops RESPIRATORY:  Clear to auscultation without rales, wheezing or rhonchi  ABDOMEN: Soft, non-tender, non-distended EXTREMITIES:  No edema; No deformity  ASSESSMENT AND PLAN: .   Assessment and Plan    Aortic stenosis due to bicuspid aortic valve Progression with increased pressure gradient. Symptoms suggest severe stenosis. Pulmonary evaluation unremarkable. Discussed TAVR as a potential intervention pending further testing. - Ordered repeat echocardiogram to assess valve function. - Consider referral to Structural Heart if severe stenosis is confirmed. - Advised to avoid strenuous activity until further evaluation.  Chronic left bundle branch block Noted on EKG as a chronic finding without acute changes or symptoms.  Nonobstructive coronary artery disease Minimal disease on coronary CTA. Symptoms unlikely related to coronary artery disease.  Hyperlipidemia LDL at goal with Lipitor 20 mg. - Continue Lipitor 20 mg daily.                Follow-up: Return in about 6 months (around 07/23/2025).  Signed, Darryle DASEN. Barbaraann, MD, [facility]  [facility]  [address] [place], [place] [address] [phone]  3:38 PM   "

## 2025-01-23 ENCOUNTER — Encounter (HOSPITAL_BASED_OUTPATIENT_CLINIC_OR_DEPARTMENT_OTHER): Payer: Self-pay | Admitting: Cardiovascular Disease

## 2025-01-23 ENCOUNTER — Ambulatory Visit (HOSPITAL_BASED_OUTPATIENT_CLINIC_OR_DEPARTMENT_OTHER): Admitting: Cardiovascular Disease

## 2025-01-23 VITALS — BP 148/88 | HR 78 | Ht 63.0 in | Wt 138.8 lb

## 2025-01-23 DIAGNOSIS — I447 Left bundle-branch block, unspecified: Secondary | ICD-10-CM

## 2025-01-23 DIAGNOSIS — R0602 Shortness of breath: Secondary | ICD-10-CM | POA: Diagnosis not present

## 2025-01-23 DIAGNOSIS — Q2381 Bicuspid aortic valve: Secondary | ICD-10-CM | POA: Diagnosis not present

## 2025-01-23 DIAGNOSIS — I350 Nonrheumatic aortic (valve) stenosis: Secondary | ICD-10-CM | POA: Diagnosis not present

## 2025-01-23 NOTE — Patient Instructions (Addendum)
 Medication Instructions:  Your physician recommends that you continue on your current medications as directed. Please refer to the Current Medication list given to you today.   *If you need a refill on your cardiac medications before your next appointment, please call your pharmacy*  Lab Work: NONE  Testing/Procedures: Your physician has requested that you have an echocardiogram. Echocardiography is a painless test that uses sound waves to create images of your heart. It provides your doctor with information about the size and shape of your heart and how well your hearts chambers and valves are working. This procedure takes approximately one hour. There are no restrictions for this procedure. Please do NOT wear cologne, perfume, aftershave, or lotions (deodorant is allowed). Please arrive 15 minutes prior to your appointment time.  Please note: We ask at that you not bring children with you during ultrasound (echo/ vascular) testing. Due to room size and safety concerns, children are not allowed in the ultrasound rooms during exams. Our front office staff cannot provide observation of children in our lobby area while testing is being conducted. An adult accompanying a patient to their appointment will only be allowed in the ultrasound room at the discretion of the ultrasound technician under special circumstances. We apologize for any inconvenience. 01/30/2024 AT 11:30   Follow-Up: At [facility], you and your health needs are our priority.  As part of our continuing mission to provide you with exceptional heart care, our providers are all part of one team.  This team includes your primary Cardiologist (physician) and Advanced Practice Providers or APPs (Physician Assistants and Nurse Practitioners) who all work together to provide you with the care you need, when you need it.  Your next appointment:   6 month(s)  Provider:   Darryle ONEIDA Decent, MD    We recommend signing up for the  patient portal called MyChart.  Sign up information is provided on this After Visit Summary.  MyChart is used to connect with patients for Virtual Visits (Telemedicine).  Patients are able to view lab/test results, encounter notes, upcoming appointments, etc.  Non-urgent messages can be sent to your provider as well.   To learn more about what you can do with MyChart, go to [email or web address].   Other Instructions

## 2025-01-29 ENCOUNTER — Other Ambulatory Visit (HOSPITAL_BASED_OUTPATIENT_CLINIC_OR_DEPARTMENT_OTHER)

## 2025-02-22 ENCOUNTER — Encounter

## 2025-02-22 ENCOUNTER — Ambulatory Visit: Admitting: Pulmonary Disease

## 2025-06-01 ENCOUNTER — Ambulatory Visit: Admitting: Family Medicine
